# Patient Record
Sex: Female | Born: 1955 | Race: White | Hispanic: Yes | Smoking: Former smoker
Health system: Southern US, Community
[De-identification: ages and names within clinical notes are randomized; demographics above are authoritative.]

## PROBLEM LIST (undated history)

## (undated) DIAGNOSIS — Z9889 Other specified postprocedural states: Secondary | ICD-10-CM

## (undated) DIAGNOSIS — R112 Nausea with vomiting, unspecified: Secondary | ICD-10-CM

## (undated) DIAGNOSIS — I10 Essential (primary) hypertension: Secondary | ICD-10-CM

## (undated) DIAGNOSIS — F32A Depression, unspecified: Secondary | ICD-10-CM

## (undated) DIAGNOSIS — F329 Major depressive disorder, single episode, unspecified: Secondary | ICD-10-CM

## (undated) DIAGNOSIS — F419 Anxiety disorder, unspecified: Secondary | ICD-10-CM

## (undated) DIAGNOSIS — T7840XA Allergy, unspecified, initial encounter: Secondary | ICD-10-CM

## (undated) DIAGNOSIS — Z8489 Family history of other specified conditions: Secondary | ICD-10-CM

## (undated) DIAGNOSIS — R232 Flushing: Secondary | ICD-10-CM

## (undated) DIAGNOSIS — R319 Hematuria, unspecified: Secondary | ICD-10-CM

## (undated) DIAGNOSIS — Z9289 Personal history of other medical treatment: Secondary | ICD-10-CM

## (undated) DIAGNOSIS — E785 Hyperlipidemia, unspecified: Secondary | ICD-10-CM

## (undated) HISTORY — DX: Depression, unspecified: F32.A

## (undated) HISTORY — DX: Flushing: R23.2

## (undated) HISTORY — DX: Personal history of other medical treatment: Z92.89

## (undated) HISTORY — DX: Allergy, unspecified, initial encounter: T78.40XA

## (undated) HISTORY — DX: Anxiety disorder, unspecified: F41.9

## (undated) HISTORY — DX: Hyperlipidemia, unspecified: E78.5

## (undated) HISTORY — DX: Hematuria, unspecified: R31.9

## (undated) HISTORY — PX: ECTOPIC PREGNANCY SURGERY: SHX613

## (undated) HISTORY — DX: Major depressive disorder, single episode, unspecified: F32.9

## (undated) HISTORY — PX: OTHER SURGICAL HISTORY: SHX169

## (undated) HISTORY — PX: BREAST LUMPECTOMY: SHX2

---

## 2010-01-27 ENCOUNTER — Emergency Department (HOSPITAL_COMMUNITY): Admission: EM | Admit: 2010-01-27 | Discharge: 2010-01-28 | Payer: Self-pay | Admitting: Emergency Medicine

## 2010-02-07 ENCOUNTER — Ambulatory Visit: Payer: Self-pay | Admitting: Psychiatry

## 2010-02-07 ENCOUNTER — Inpatient Hospital Stay (HOSPITAL_COMMUNITY)
Admission: AD | Admit: 2010-02-07 | Discharge: 2010-02-11 | Payer: Self-pay | Source: Home / Self Care | Admitting: Psychiatry

## 2010-02-14 ENCOUNTER — Encounter
Admission: RE | Admit: 2010-02-14 | Discharge: 2010-02-14 | Payer: Self-pay | Source: Home / Self Care | Attending: Orthopedic Surgery | Admitting: Orthopedic Surgery

## 2010-05-20 LAB — URINE MICROSCOPIC-ADD ON

## 2010-05-20 LAB — COMPREHENSIVE METABOLIC PANEL
ALT: 16 U/L (ref 0–35)
Albumin: 4 g/dL (ref 3.5–5.2)
Alkaline Phosphatase: 85 U/L (ref 39–117)
Glucose, Bld: 112 mg/dL — ABNORMAL HIGH (ref 70–99)
Potassium: 3.9 mEq/L (ref 3.5–5.1)
Sodium: 142 mEq/L (ref 135–145)
Total Protein: 7.6 g/dL (ref 6.0–8.3)

## 2010-05-20 LAB — DRUGS OF ABUSE SCREEN W/O ALC, ROUTINE URINE
Barbiturate Quant, Ur: NEGATIVE
Benzodiazepines.: NEGATIVE
Cocaine Metabolites: NEGATIVE
Methadone: NEGATIVE
Phencyclidine (PCP): NEGATIVE

## 2010-05-20 LAB — CBC
HCT: 39.3 % (ref 36.0–46.0)
Platelets: 254 10*3/uL (ref 150–400)
RDW: 13.3 % (ref 11.5–15.5)
WBC: 6.9 10*3/uL (ref 4.0–10.5)

## 2010-05-20 LAB — URINALYSIS, ROUTINE W REFLEX MICROSCOPIC
Glucose, UA: NEGATIVE mg/dL
Ketones, ur: NEGATIVE mg/dL
Protein, ur: NEGATIVE mg/dL

## 2010-08-16 ENCOUNTER — Emergency Department (HOSPITAL_COMMUNITY)
Admission: EM | Admit: 2010-08-16 | Discharge: 2010-08-16 | Disposition: A | Discharge status: Home / Self Care | Payer: Medicare Other | Attending: Emergency Medicine | Admitting: Emergency Medicine

## 2010-08-16 ENCOUNTER — Emergency Department (HOSPITAL_COMMUNITY): Payer: Medicare Other

## 2010-08-16 DIAGNOSIS — R220 Localized swelling, mass and lump, head: Secondary | ICD-10-CM | POA: Insufficient documentation

## 2010-08-16 DIAGNOSIS — Z79899 Other long term (current) drug therapy: Secondary | ICD-10-CM | POA: Insufficient documentation

## 2010-08-16 DIAGNOSIS — R079 Chest pain, unspecified: Secondary | ICD-10-CM | POA: Insufficient documentation

## 2010-08-16 DIAGNOSIS — S01501A Unspecified open wound of lip, initial encounter: Secondary | ICD-10-CM | POA: Insufficient documentation

## 2010-08-16 DIAGNOSIS — R070 Pain in throat: Secondary | ICD-10-CM | POA: Insufficient documentation

## 2010-08-16 DIAGNOSIS — R05 Cough: Secondary | ICD-10-CM | POA: Insufficient documentation

## 2010-08-16 DIAGNOSIS — R059 Cough, unspecified: Secondary | ICD-10-CM | POA: Insufficient documentation

## 2010-08-16 DIAGNOSIS — Y9210 Unspecified residential institution as the place of occurrence of the external cause: Secondary | ICD-10-CM | POA: Insufficient documentation

## 2010-08-16 DIAGNOSIS — R221 Localized swelling, mass and lump, neck: Secondary | ICD-10-CM | POA: Insufficient documentation

## 2010-08-16 DIAGNOSIS — S0003XA Contusion of scalp, initial encounter: Secondary | ICD-10-CM | POA: Insufficient documentation

## 2010-08-16 DIAGNOSIS — IMO0002 Reserved for concepts with insufficient information to code with codable children: Secondary | ICD-10-CM | POA: Insufficient documentation

## 2010-08-16 DIAGNOSIS — R509 Fever, unspecified: Secondary | ICD-10-CM | POA: Insufficient documentation

## 2010-08-16 DIAGNOSIS — J40 Bronchitis, not specified as acute or chronic: Secondary | ICD-10-CM | POA: Insufficient documentation

## 2010-08-16 DIAGNOSIS — Z7982 Long term (current) use of aspirin: Secondary | ICD-10-CM | POA: Insufficient documentation

## 2011-11-26 ENCOUNTER — Encounter: Payer: Self-pay | Admitting: Gastroenterology

## 2011-11-26 ENCOUNTER — Other Ambulatory Visit: Payer: Self-pay | Admitting: Family Medicine

## 2011-11-26 DIAGNOSIS — Z1231 Encounter for screening mammogram for malignant neoplasm of breast: Secondary | ICD-10-CM

## 2011-12-17 ENCOUNTER — Ambulatory Visit: Payer: Medicare Other

## 2011-12-17 ENCOUNTER — Ambulatory Visit: Payer: Medicare Other | Admitting: Cardiology

## 2011-12-24 ENCOUNTER — Ambulatory Visit (AMBULATORY_SURGERY_CENTER): Payer: Medicare Other | Admitting: *Deleted

## 2011-12-24 VITALS — Ht 60.0 in | Wt 142.7 lb

## 2011-12-24 DIAGNOSIS — Z1211 Encounter for screening for malignant neoplasm of colon: Secondary | ICD-10-CM

## 2011-12-24 MED ORDER — NA SULFATE-K SULFATE-MG SULF 17.5-3.13-1.6 GM/177ML PO SOLN: 1.0000 | Freq: Once | ORAL | Status: DC

## 2012-01-06 ENCOUNTER — Encounter: Payer: Self-pay | Admitting: *Deleted

## 2012-01-06 ENCOUNTER — Encounter: Payer: Medicare Other | Admitting: Gastroenterology

## 2012-01-06 ENCOUNTER — Encounter: Payer: Self-pay | Admitting: Cardiology

## 2012-01-06 DIAGNOSIS — F418 Other specified anxiety disorders: Secondary | ICD-10-CM | POA: Insufficient documentation

## 2012-01-06 DIAGNOSIS — E785 Hyperlipidemia, unspecified: Secondary | ICD-10-CM | POA: Insufficient documentation

## 2012-01-06 DIAGNOSIS — T7840XA Allergy, unspecified, initial encounter: Secondary | ICD-10-CM | POA: Insufficient documentation

## 2012-01-06 DIAGNOSIS — F419 Anxiety disorder, unspecified: Secondary | ICD-10-CM | POA: Insufficient documentation

## 2012-01-07 ENCOUNTER — Ambulatory Visit (INDEPENDENT_AMBULATORY_CARE_PROVIDER_SITE_OTHER): Payer: Medicare Other | Admitting: Cardiology

## 2012-01-07 ENCOUNTER — Encounter: Payer: Self-pay | Admitting: *Deleted

## 2012-01-07 VITALS — BP 136/84 | HR 74 | Ht 60.0 in | Wt 142.8 lb

## 2012-01-07 DIAGNOSIS — R079 Chest pain, unspecified: Secondary | ICD-10-CM | POA: Insufficient documentation

## 2012-01-07 DIAGNOSIS — R319 Hematuria, unspecified: Secondary | ICD-10-CM | POA: Insufficient documentation

## 2012-01-07 DIAGNOSIS — R232 Flushing: Secondary | ICD-10-CM | POA: Insufficient documentation

## 2012-01-07 DIAGNOSIS — E785 Hyperlipidemia, unspecified: Secondary | ICD-10-CM

## 2012-01-07 NOTE — Progress Notes (Signed)
HPI: 56 year old female with no prior cardiac history for evaluation of chest pain. Patient is a very difficult historian. However she describes intermittent chest pain for approximately 2 months. The pain is in the left chest area. It is described as a sharp pain and increased with stress. It lasted "a while". She cannot be more specific. She occasionally has pain in her left upper extremity and left lower extremity. Her pain can have mild dyspnea but no associated nausea or diaphoresis. It resolves spontaneously. It is not pleuritic. She does not have exertional chest pain and denies dyspnea on exertion, orthopnea or pedal edema. Because of her chest pain we are asked to evaluate.  Current Outpatient Prescriptions  Medication Sig Dispense Refill  . ALPRAZolam (XANAX) 1 MG tablet       . aspirin 81 MG tablet Take 81 mg by mouth as needed.       Marland Kitchen azelastine (ASTELIN) 137 MCG/SPRAY nasal spray 1 spray as needed.       . busPIRone (BUSPAR) 5 MG tablet Take 5 mg by mouth 3 (three) times daily.      . clonazePAM (KLONOPIN) 0.5 MG tablet Take 0.5 mg by mouth 3 (three) times daily as needed.      . cyclobenzaprine (FLEXERIL) 10 MG tablet Take by mouth 2 (two) times daily as needed.       . Na Sulfate-K Sulfate-Mg Sulf (SUPREP BOWEL PREP) SOLN Take 1 kit by mouth once. suprep as directed. No substitutions  354 mL  0  . sertraline (ZOLOFT) 100 MG tablet Take 100 mg by mouth daily.      . simvastatin (ZOCOR) 20 MG tablet Take 20 mg by mouth at bedtime.        Allergies  Allergen Reactions  . Hydrocodone Nausea And Vomiting    dizzy  . Mucinex Dm (Dm-Guaifenesin Er) Swelling    Sob, throat closing    Past Medical History  Diagnosis Date  . Allergy     seasonal  . Anxiety   . Hyperlipidemia   . Hot flashes   . Hematuria     Past Surgical History  Procedure Date  . Ectopic pregnancy surgery   . Breast lumpectomy     right breast, not cancer  . Carpel tunnel     History   Social  History  . Marital Status: Legally Separated    Spouse Name: N/A    Number of Children: 1  . Years of Education: N/A   Occupational History  .     Social History Main Topics  . Smoking status: Former Games developer  . Smokeless tobacco: Never Used  . Alcohol Use: No  . Drug Use: No  . Sexually Active: Not on file   Other Topics Concern  . Not on file   Social History Narrative  . No narrative on file    Family History  Problem Relation Age of Onset  . Heart disease Mother   . Diabetes Mother   . Esophageal cancer Father     13's  . Colon cancer Neg Hx     ROS: occasional pain in left upper extremity and bottom of left foot but no fevers or chills, productive cough, hemoptysis, dysphasia, odynophagia, melena, hematochezia, dysuria, hematuria, rash, seizure activity, orthopnea, PND, pedal edema, claudication. Remaining systems are negative.  Physical Exam:   Blood pressure 136/84, pulse 74, height 5' (1.524 m), weight 142 lb 12.8 oz (64.774 kg).  General:  Well developed/well nourished, anxious in NAD Skin  warm/dry Patient not depressed No peripheral clubbing Back-normal HEENT-normal/normal eyelids Neck supple/normal carotid upstroke bilaterally; no bruits; no JVD; no thyromegaly chest - CTA/ normal expansion CV - RRR/normal S1 and S2; no murmurs, rubs or gallops;  PMI nondisplaced Abdomen -NT/ND, no HSM, no mass, + bowel sounds, no bruit 2+ femoral pulses, no bruits Ext-no edema, chords, 2+ DP Neuro-grossly nonfocal  ECG NSR with no ST changes

## 2012-01-07 NOTE — Assessment & Plan Note (Signed)
Management per primary care. 

## 2012-01-07 NOTE — Assessment & Plan Note (Signed)
Difficult historian. However symptoms are extremely atypical. Electrocardiogram is normal. Will arrange exercise treadmill for risk stratification.

## 2012-01-07 NOTE — Patient Instructions (Addendum)
Your physician recommends that you schedule a follow-up appointment in: AS NEEDED  Your physician has requested that you have an exercise tolerance test. For further information please visit [email or web address]. Please also follow instruction sheet, as given.    Exercise Stress Electrocardiography An exercise stress test is a heart test (EKG) which is done while you are moving. You will walk on a treadmill. This test will tell your doctor how your heart does when it is forced to work harder and how much activity you can safely handle. BEFORE THE TEST  Wear shorts or athletic pants.  Wear comfortable tennis shoes.  Women need to wear a bra that allows patches to be put on under it. TEST  An EKG cable will be attached to your waist. This cable is hooked up to patches, which look like round stickers stuck to your chest.  You will be asked to walk on the treadmill.  You will walk until you are too tired or until you are told to stop.  Tell the doctor right away if you have:  Chest pain.  Leg cramps.  Shortness of breath.  Dizziness.  The test may last 30 minutes to 1 hour. The timing depends on your physical condition and the condition of your heart. AFTER THE TEST  You will rest for about 6 minutes. During this time, your heart rhythm and blood pressure will be checked.  The testing equipment will be removed from your body and you can get dressed.  You may go home or back to your hospital room. You may keep doing all your usual activities as told by your doctor. Finding out the results of your test Ask when your test results will be ready. Make sure you get your test results. Document Released: 08/12/2007 Document Revised: 05/18/2011 Document Reviewed: 08/12/2007 Exit[employer] Patient Information 2013 [employer].   

## 2012-01-12 ENCOUNTER — Ambulatory Visit
Admission: RE | Admit: 2012-01-12 | Discharge: 2012-01-12 | Disposition: A | Discharge status: Home / Self Care | Payer: Medicare Other | Source: Ambulatory Visit | Attending: Family Medicine | Admitting: Family Medicine

## 2012-01-12 DIAGNOSIS — Z1231 Encounter for screening mammogram for malignant neoplasm of breast: Secondary | ICD-10-CM

## 2012-01-25 ENCOUNTER — Encounter: Payer: Medicare Other | Admitting: Physician Assistant

## 2012-01-28 ENCOUNTER — Encounter: Payer: Medicare Other | Admitting: Gastroenterology

## 2012-02-09 ENCOUNTER — Ambulatory Visit (INDEPENDENT_AMBULATORY_CARE_PROVIDER_SITE_OTHER): Payer: Medicare Other | Admitting: Physician Assistant

## 2012-02-09 DIAGNOSIS — R079 Chest pain, unspecified: Secondary | ICD-10-CM

## 2012-02-09 DIAGNOSIS — R9439 Abnormal result of other cardiovascular function study: Secondary | ICD-10-CM

## 2012-02-09 NOTE — Patient Instructions (Addendum)
Your physician has requested that you have a lexiscan myoview DX ABNORMAL STRESS TEST. For further information please visit [email or web address] Please follow instruction sheet, as given.  PLEASE FOLLOW UP WITH PRIMARY CARE PHYSICIAN FOR BLOOD PRESSURE

## 2012-02-09 NOTE — Progress Notes (Signed)
Exercise Treadmill Test  Pre-Exercise Testing Evaluation Rhythm: normal sinus  Rate: 64   PR:  .19 QRS:  .04  QT:  .32 QTc: .33           Test  Exercise Tolerance Test Ordering MD: Olga Millers, MD  Interpreting MD: Tereso Newcomer, PA-C  Unique Test No: 1  Treadmill:  1  Indication for ETT: chest pain - rule out ischemia  Contraindication to ETT: No   Stress Modality: exercise - treadmill  Cardiac Imaging Performed: non   Protocol: standard Bruce - maximal  Max BP:  211/111  Max MPHR (bpm):  164 85% MPR (bpm):  139  MPHR obtained (bpm):  155 % MPHR obtained:  94%  Reached 85% MPHR (min:sec):  3:24 Total Exercise Time (min-sec):  4:02  Workload in METS:  5.8 Borg Scale: 16  Reason ETT Terminated:  exaggerated hypertensive response    ST Segment Analysis At Rest: normal ST segments - no evidence of significant ST depression With Exercise: borderline ST changes  Other Information Arrhythmia:  Occasional PVCs Angina during ETT:  absent (0) Quality of ETT:  indeterminate  ETT Interpretation:  borderline (indeterminate) with non-specific ST changes  Comments: Fair exercise tolerance. No chest pain.  She did complain of dyspnea. Hypertensive BP response to exercise.  Test d/c'd due to exaggerated BP response. Borderline ST changes- cannot rule out ischemia.   Recommendations: Given Hypertensive response and borderline ECG changes, will schedule Lexiscan Myoview to rule out ischemia. Patient should follow up with PCP for BP control. Follow up with Dr. Olga Millers as directed. Signed,  Tereso Newcomer, PA-C  1:11 PM 02/09/2012

## 2012-02-11 ENCOUNTER — Ambulatory Visit (HOSPITAL_COMMUNITY): Payer: Medicare Other | Attending: Cardiology | Admitting: Radiology

## 2012-02-11 VITALS — BP 147/75 | Ht 60.0 in | Wt 141.0 lb

## 2012-02-11 DIAGNOSIS — R0602 Shortness of breath: Secondary | ICD-10-CM

## 2012-02-11 DIAGNOSIS — Z87891 Personal history of nicotine dependence: Secondary | ICD-10-CM | POA: Insufficient documentation

## 2012-02-11 DIAGNOSIS — Z8249 Family history of ischemic heart disease and other diseases of the circulatory system: Secondary | ICD-10-CM | POA: Insufficient documentation

## 2012-02-11 DIAGNOSIS — R9439 Abnormal result of other cardiovascular function study: Secondary | ICD-10-CM

## 2012-02-11 DIAGNOSIS — E785 Hyperlipidemia, unspecified: Secondary | ICD-10-CM | POA: Insufficient documentation

## 2012-02-11 DIAGNOSIS — R079 Chest pain, unspecified: Secondary | ICD-10-CM | POA: Insufficient documentation

## 2012-02-11 MED ORDER — TECHNETIUM TC 99M SESTAMIBI GENERIC - CARDIOLITE
11.0000 | Freq: Once | INTRAVENOUS | Status: AC | PRN
  Administered 2012-02-11: 11 via INTRAVENOUS

## 2012-02-11 MED ORDER — AMINOPHYLLINE 25 MG/ML IV SOLN
150.0000 mg | Freq: Once | INTRAVENOUS | Status: AC
  Administered 2012-02-11: 150 mg via INTRAVENOUS

## 2012-02-11 MED ORDER — REGADENOSON 0.4 MG/5ML IV SOLN
0.4000 mg | Freq: Once | INTRAVENOUS | Status: AC
  Administered 2012-02-11: 0.4 mg via INTRAVENOUS

## 2012-02-11 MED ORDER — TECHNETIUM TC 99M SESTAMIBI GENERIC - CARDIOLITE
33.0000 | Freq: Once | INTRAVENOUS | Status: AC | PRN
  Administered 2012-02-11: 33 via INTRAVENOUS

## 2012-02-11 NOTE — Progress Notes (Signed)
[facility] SITE 3 NUCLEAR MED [id number] [place] [address] [phone]  Cardiology Nuclear Med Study  Katie Gardner is a 56 y.o. female     MRN : [id number]     DOB: [birth year]  Procedure Date: 02/11/2012  Nuclear Med Background Indication for Stress Test:  Evaluation for Ischemia, and 02-09-12 GXT cancelled due to Hypertensive response with borderline nonspecific ST changes History:  3 yrs ago MPS: Ok per pt ([place]) 02/09/12 GXT: CX'd due to HTN response with borderline Non Specific ST changes Cardiac Risk Factors: Family History - CAD, History of Smoking and Lipids  Symptoms:  Chest Pain and SOB   Nuclear Pre-Procedure Caffeine/Decaff Intake:  None > 12 hrs NPO After: 9:00pm   Lungs:  clear O2 Sat: 97% on room air. IV 0.9% NS with Angio Cath:  22g  IV Site: R Antecubital x 1, tolerated well IV Started by:  Irean Hong, RN  Chest Size (in):  34 Cup Size: B  Height: 5' (1.524 m)  Weight:  141 lb (63.957 kg)  BMI:  Body mass index is 27.54 kg/(m^2). Tech Comments:  n/a    Nuclear Med Study 1 or 2 day study: 1 day  Stress Test Type:  Treadmill/Lexiscan  Reading MD: Olga Millers, MD  Order Authorizing Provider:  Tonny Bollman, MD, and Tereso Newcomer, [facility]  Resting Radionuclide: Technetium 49m Sestamibi  Resting Radionuclide Dose: 11.0 mCi   Stress Radionuclide:  Technetium 24m Sestamibi  Stress Radionuclide Dose: 33.0 mCi           Stress Protocol Rest HR: 70 Stress HR: 144  Rest BP: 147/75 Stress BP: 204/80  Exercise Time (min): n/a METS: n/a   Predicted Max HR: 164 bpm % Max HR: 87.8 bpm Rate Pressure Product: 62952   Dose of Adenosine (mg):  n/a Dose of Lexiscan: 0.4 mg  Dose of Atropine (mg): n/a Dose of Dobutamine: n/a mcg/kg/min (at max HR)  Stress Test Technologist: Milana Na, EMT-P  Nuclear Technologist:  Domenic Polite, CNMT     Rest Procedure:  Myocardial perfusion imaging was performed at rest 45  minutes following the intravenous administration of Technetium 35m Sestamibi. Rest ECG: NSR - Normal EKG  Stress Procedure:  The patient received IV Lexiscan 0.4 mg over 15-seconds with concurrent low level exercise and then Technetium 59m Tetrofosmin was injected at 30-seconds while the patient continued walking one more minute. There were no significant changes, + chest heaviness, weakness, and nausea with Lexiscan.This patient was very weak and had a lot of nausea. She was reversed with Aminophylline 150 mg IV with total reversal of the symptoms. Quantitative spect images were obtained after a 45-minute delay. Stress ECG: Insignificant upsloping ST segment depression.  QPS Raw Data Images:  There is interference from nuclear activity from structures below the diaphragm. This does not affect the ability to read the study. Stress Images:  There is decreased uptake in the apex. Rest Images:  There is decreased uptake in the apex. Subtraction (SDS):  No evidence of ischemia. Transient Ischemic Dilatation (Normal <1.22):  0.93 Lung/Heart Ratio (Normal <0.45):  0.12  Quantitative Gated Spect Images QGS EDV:  71 ml QGS ESV:  23 ml  Impression Exercise Capacity:  Lexiscan with no exercise. BP Response:  Hypertensive blood pressure response. Clinical Symptoms:  There is chest pain. ECG Impression:  Insignificant upsloping ST segment depression. Comparison with Prior Nuclear Study: No images to compare  Overall Impression:  Normal stress nuclear study with  a small, mild, fixed apical defect consistent with thinning; no ischemia.  LV Ejection Fraction: 68%.  LV Wall Motion:  NL LV Function; NL Wall Motion  Olga Millers

## 2012-02-12 ENCOUNTER — Encounter: Payer: Self-pay | Admitting: Physician Assistant

## 2013-03-04 ENCOUNTER — Emergency Department (HOSPITAL_COMMUNITY)
Admission: EM | Admit: 2013-03-04 | Discharge: 2013-03-04 | Disposition: A | Discharge status: Home / Self Care | Payer: Medicare Other | Attending: Emergency Medicine | Admitting: Emergency Medicine

## 2013-03-04 ENCOUNTER — Encounter (HOSPITAL_COMMUNITY): Payer: Self-pay | Admitting: Emergency Medicine

## 2013-03-04 DIAGNOSIS — E785 Hyperlipidemia, unspecified: Secondary | ICD-10-CM | POA: Insufficient documentation

## 2013-03-04 DIAGNOSIS — Z7982 Long term (current) use of aspirin: Secondary | ICD-10-CM | POA: Insufficient documentation

## 2013-03-04 DIAGNOSIS — Z8742 Personal history of other diseases of the female genital tract: Secondary | ICD-10-CM | POA: Insufficient documentation

## 2013-03-04 DIAGNOSIS — Z87891 Personal history of nicotine dependence: Secondary | ICD-10-CM | POA: Insufficient documentation

## 2013-03-04 DIAGNOSIS — Z79899 Other long term (current) drug therapy: Secondary | ICD-10-CM | POA: Insufficient documentation

## 2013-03-04 DIAGNOSIS — L259 Unspecified contact dermatitis, unspecified cause: Secondary | ICD-10-CM

## 2013-03-04 DIAGNOSIS — Z87448 Personal history of other diseases of urinary system: Secondary | ICD-10-CM | POA: Insufficient documentation

## 2013-03-04 DIAGNOSIS — F411 Generalized anxiety disorder: Secondary | ICD-10-CM | POA: Insufficient documentation

## 2013-03-04 MED ORDER — PREDNISONE 50 MG PO TABS: ORAL_TABLET | ORAL | Status: DC

## 2013-03-04 MED ORDER — DIPHENHYDRAMINE HCL 25 MG PO CAPS: 25.0000 mg | ORAL_CAPSULE | Freq: Four times a day (QID) | ORAL | Status: DC | PRN

## 2013-03-04 MED ORDER — FAMOTIDINE 20 MG PO TABS: 20.0000 mg | ORAL_TABLET | Freq: Two times a day (BID) | ORAL | Status: DC

## 2013-03-04 NOTE — ED Notes (Signed)
Genera;ized rash over her body since yesterday withnitching.  She thinks she is allergic to mushrooms she ate them yesterday

## 2013-03-04 NOTE — ED Provider Notes (Signed)
CSN: [id number]     Arrival date & time 03/04/13  1944 History   First MD Initiated Contact with Patient 03/04/13 2044 This chart was scribed for non-physician practitioner Allean Found, PA-C working with Hurman Horn, MD by Valera Castle, ED scribe. This patient was seen in room TR07C/TR07C and the patient's care was started at 8:54 PM.     Chief Complaint  Patient presents with  . Rash    The history is provided by the patient. No language interpreter was used.   HPI Comments: Arti Trang is a 57 y.o. female who presents to the Emergency Department complaining of sudden, generalized, itching rash over her body, onset yesterday. She denies h/o similar rash. She reports she has tried Benadryl, with little relief. She denies fever, and any other associated symptoms. She denies h/o DM.   PCP - Katharina Caper, NP  Past Medical History  Diagnosis Date  . Allergy     seasonal  . Anxiety   . Hyperlipidemia   . Hot flashes   . Hematuria   . Hx of cardiovascular stress test     Lex MV 12/13:  EF 68%, mild apical thinning, no ischemia   Past Surgical History  Procedure Laterality Date  . Ectopic pregnancy surgery    . Breast lumpectomy      right breast, not cancer  . Carpel tunnel     Family History  Problem Relation Age of Onset  . Heart disease Mother   . Diabetes Mother   . Esophageal cancer Father     83's  . Colon cancer Neg Hx    History  Substance Use Topics  . Smoking status: Former Games developer  . Smokeless tobacco: Never Used  . Alcohol Use: No   OB History   Grav Para Term Preterm Abortions TAB SAB Ect Mult Living                 Review of Systems  Constitutional: Negative for fever.  Skin: Positive for rash (generalized, itching).  All other systems reviewed and are negative.    Allergies  Hydrocodone and Mucinex dm  Home Medications   Current Outpatient Rx  Name  Route  Sig  Dispense  Refill  . ALPRAZolam (XANAX) 1 MG tablet               .  aspirin 81 MG tablet   Oral   Take 81 mg by mouth as needed.          Marland Kitchen azelastine (ASTELIN) 137 MCG/SPRAY nasal spray      1 spray as needed.          . busPIRone (BUSPAR) 5 MG tablet   Oral   Take 5 mg by mouth 3 (three) times daily.         . clonazePAM (KLONOPIN) 0.5 MG tablet   Oral   Take 0.5 mg by mouth 3 (three) times daily as needed.         . cyclobenzaprine (FLEXERIL) 10 MG tablet   Oral   Take by mouth 2 (two) times daily as needed.          . Na Sulfate-K Sulfate-Mg Sulf (SUPREP BOWEL PREP) SOLN   Oral   Take 1 kit by mouth once. suprep as directed. No substitutions   354 mL   0   . sertraline (ZOLOFT) 100 MG tablet   Oral   Take 100 mg by mouth daily.         Marland Kitchen  simvastatin (ZOCOR) 20 MG tablet   Oral   Take 20 mg by mouth at bedtime.          BP 143/69  Pulse 64  Temp(Src) 98.7 F (37.1 C) (Oral)  Resp 18  Ht 5\' 1"  (1.549 m)  Wt 142 lb (64.411 kg)  BMI 26.84 kg/m2  SpO2 98%  Physical Exam  Nursing note and vitals reviewed. Constitutional: She is oriented to person, place, and time. She appears well-developed and well-nourished. No distress.  HENT:  Head: Normocephalic and atraumatic.  Eyes: EOM are normal.  Neck: Neck supple. No tracheal deviation present.  Cardiovascular: Normal rate.   Pulmonary/Chest: Effort normal. No respiratory distress.  Musculoskeletal: Normal range of motion.  Neurological: She is alert and oriented to person, place, and time.  Skin: Skin is warm and dry.  Generally distributed macular papular rash. No vesicles or blisters. Minimal redness consistent with contact dermatitis.   Psychiatric: She has a normal mood and affect. Her behavior is normal.    ED Course  Procedures (including critical care time)  DIAGNOSTIC STUDIES: Oxygen Saturation is 98% on room air, normal by my interpretation.    COORDINATION OF CARE: 8:57 PM-Discussed treatment plan which includes Prednisone, Benadryl, and Pepcid with  pt at bedside and pt agreed to plan.   Labs Review Labs Reviewed - No data to display Imaging Review No results found.  EKG Interpretation   None      No orders of the defined types were placed in this encounter.    MDM  No diagnosis found. 1. Contact dermatitis  Simple rash without other finding c/w contact dermatitis.    I personally performed the services described in this documentation, which was scribed in my presence. The recorded information has been reviewed and is accurate.     Arnoldo Hooker, PA-C 03/04/13 2115

## 2013-03-06 NOTE — ED Provider Notes (Signed)
Medical screening examination/treatment/procedure(s) were performed by non-physician practitioner and as supervising physician I was immediately available for consultation/collaboration.   Darcelle Herrada M Beyounce Dickens, MD 03/06/13 1413 

## 2013-03-16 ENCOUNTER — Emergency Department (HOSPITAL_COMMUNITY)
Admission: EM | Admit: 2013-03-16 | Discharge: 2013-03-16 | Disposition: A | Discharge status: Home / Self Care | Payer: Medicare HMO | Attending: Emergency Medicine | Admitting: Emergency Medicine

## 2013-03-16 ENCOUNTER — Encounter (HOSPITAL_COMMUNITY): Payer: Self-pay | Admitting: Emergency Medicine

## 2013-03-16 DIAGNOSIS — Z862 Personal history of diseases of the blood and blood-forming organs and certain disorders involving the immune mechanism: Secondary | ICD-10-CM | POA: Insufficient documentation

## 2013-03-16 DIAGNOSIS — Z8742 Personal history of other diseases of the female genital tract: Secondary | ICD-10-CM | POA: Insufficient documentation

## 2013-03-16 DIAGNOSIS — M545 Low back pain, unspecified: Secondary | ICD-10-CM | POA: Insufficient documentation

## 2013-03-16 DIAGNOSIS — IMO0002 Reserved for concepts with insufficient information to code with codable children: Secondary | ICD-10-CM | POA: Insufficient documentation

## 2013-03-16 DIAGNOSIS — Z87448 Personal history of other diseases of urinary system: Secondary | ICD-10-CM | POA: Insufficient documentation

## 2013-03-16 DIAGNOSIS — F411 Generalized anxiety disorder: Secondary | ICD-10-CM | POA: Insufficient documentation

## 2013-03-16 DIAGNOSIS — Z79899 Other long term (current) drug therapy: Secondary | ICD-10-CM | POA: Insufficient documentation

## 2013-03-16 DIAGNOSIS — G8929 Other chronic pain: Secondary | ICD-10-CM

## 2013-03-16 DIAGNOSIS — Z8639 Personal history of other endocrine, nutritional and metabolic disease: Secondary | ICD-10-CM | POA: Insufficient documentation

## 2013-03-16 DIAGNOSIS — Z87891 Personal history of nicotine dependence: Secondary | ICD-10-CM | POA: Insufficient documentation

## 2013-03-16 DIAGNOSIS — M2550 Pain in unspecified joint: Secondary | ICD-10-CM | POA: Insufficient documentation

## 2013-03-16 MED ORDER — METHOCARBAMOL 500 MG PO TABS: 500.0000 mg | ORAL_TABLET | Freq: Two times a day (BID) | ORAL | Status: DC

## 2013-03-16 MED ORDER — TRAMADOL HCL 50 MG PO TABS: 50.0000 mg | ORAL_TABLET | Freq: Four times a day (QID) | ORAL | Status: DC | PRN

## 2013-03-16 NOTE — ED Provider Notes (Signed)
CSN: [id number]     Arrival date & time 03/16/13  1109 History  This chart was scribed for non-physician practitioner working with Katie Creasehristopher J. Pollina, MD by Ashley JacobsBrittany Andrews, ED scribe. This patient was seen in room TR05C/TR05C and the patient's care was started at 12:49 PM.   First MD Initiated Contact with Patient 03/16/13 1158     Chief Complaint  Patient presents with  . Back Pain   (Consider location/radiation/quality/duration/timing/severity/associated sxs/prior Treatment) The history is provided by the patient and medical records. No language interpreter was used.   HPI Comments: Katie CablesJuanita Gardner is a 58 y.o. female who presents to the Emergency Department complaining of chronic back pain that has been worse for the past three days. This morning she reports the pain was severe in nature with a 10/10 in severity. Pt denies injury or heavy lifting. The pain radiates to her bilateral buttocks and the back of her legs. The pain is worse with bending, twisting and movement.  No fever or chills. She denies numbness and tingling.  Denies bowel or bladder incontinence. Pt has taken Ibuprofen for the pain, which does help. Past Medical History  Diagnosis Date  . Allergy     seasonal  . Anxiety   . Hyperlipidemia   . Hot flashes   . Hematuria   . Hx of cardiovascular stress test     Lex MV 12/13:  EF 68%, mild apical thinning, no ischemia   Past Surgical History  Procedure Laterality Date  . Ectopic pregnancy surgery    . Breast lumpectomy      right breast, not cancer  . Carpel tunnel     Family History  Problem Relation Age of Onset  . Heart disease Mother   . Diabetes Mother   . Esophageal cancer Father     7670's  . Colon cancer Neg Hx    History  Substance Use Topics  . Smoking status: Former Games developermoker  . Smokeless tobacco: Never Used  . Alcohol Use: No   OB History   Grav Para Term Preterm Abortions TAB SAB Ect Mult Living                 Review of Systems   Constitutional: Negative for fever.  Musculoskeletal: Positive for arthralgias, back pain and myalgias.  Neurological: Negative for seizures.  All other systems reviewed and are negative.    Allergies  Hydrocodone and Mucinex dm  Home Medications   Current Outpatient Rx  Name  Route  Sig  Dispense  Refill  . ibuprofen (ADVIL,MOTRIN) 200 MG tablet   Oral   Take 100 mg by mouth daily as needed for mild pain.         Marland Kitchen. sertraline (ZOLOFT) 100 MG tablet   Oral   Take 100 mg by mouth daily.         . diazepam (VALIUM) 5 MG tablet   Oral   Take 5 mg by mouth daily as needed for anxiety.          . diphenhydrAMINE (BENADRYL) 25 mg capsule   Oral   Take 1 capsule (25 mg total) by mouth every 6 (six) hours as needed.   12 capsule   0   . predniSONE (DELTASONE) 50 MG tablet      Take 3 tablets days 1 and 2 Take 2 tablets days 3 and 4 Take 1 tablet days 5 and 6   12 tablet   0    BP 136/52  Pulse  82  Temp(Src) 98.5 F (36.9 C) (Oral)  Resp 20  SpO2 94% Physical Exam  Nursing note and vitals reviewed. Constitutional: She appears well-developed and well-nourished. No distress.  Awake, alert, nontoxic appearance  HENT:  Head: Normocephalic and atraumatic.  Mouth/Throat: Oropharynx is clear and moist. No oropharyngeal exudate.  Eyes: Conjunctivae are normal. No scleral icterus.  Neck: Normal range of motion. Neck supple.  Cardiovascular: Normal rate, regular rhythm and intact distal pulses.   Pulmonary/Chest: Effort normal and breath sounds normal.  Musculoskeletal: Normal range of motion. She exhibits tenderness. She exhibits no edema.  Mild lumbar spinal tenderness No edema, no erythema  Neurological: She is alert. She has normal strength. No sensory deficit. Gait normal.  Reflex Scores:      Patellar reflexes are 2+ on the right side and 2+ on the left side. Speech is clear and goal oriented  Skin: Skin is warm and dry. She is not diaphoretic.   Psychiatric: She has a normal mood and affect.    ED Course  Procedures (including critical care time) DIAGNOSTIC STUDIES: Oxygen Saturation is 94% on room air, normal by my interpretation.    COORDINATION OF CARE:  12:52 PM Discussed course of care with pt . Pt understands and agrees.  Labs Review Labs Reviewed - No data to display Imaging Review No results found.  EKG Interpretation   None       MDM  No diagnosis found. Patient with back pain.  No neurological deficits and normal neuro exam.  Patient can walk but states is painful.  No loss of bowel or bladder control.  No concern for cauda equina.  No fever, night sweats, weight loss, h/o cancer, IVDU.  RICE protocol and pain medicine indicated and discussed with patient.  Patient stable for discharge.  Return precautions given.  I personally performed the services described in this documentation, which was scribed in my presence. The recorded information has been reviewed and is accurate.     [name], PA-C 03/16/13 1339

## 2013-03-16 NOTE — Discharge Instructions (Signed)
Continue taking Ibuprofen for the pain.  If the pain is more severe, take Ultram and Robaxin for the pain.    Use conservative methods at home including heat therapy and cold therapy as we discussed. More information on cold therapy is listed below.  It is not reccommended to use heat treatment directly after an acute injury.  SEEK IMMEDIATE MEDICAL ATTENTION IF: New numbness, tingling, weakness, or problem with the use of your arms or legs.  Severe back pain not relieved with medications.  Change in bowel or bladder control.  Increasing pain in any areas of the body (such as chest or abdominal pain).  Shortness of breath, dizziness or fainting.  Nausea (feeling sick to your stomach), vomiting, fever, or sweats.  COLD THERAPY DIRECTIONS:  Ice or gel packs can be used to reduce both pain and swelling. Ice is the most helpful within the first 24 to 48 hours after an injury or flareup from overusing a muscle or joint.  Ice is effective, has very few side effects, and is safe for most people to use.   If you expose your skin to cold temperatures for too long or without the proper protection, you can damage your skin or nerves. Watch for signs of skin damage due to cold.   HOME CARE INSTRUCTIONS  Follow these tips to use ice and cold packs safely.  Place a dry or damp towel between the ice and skin. A damp towel will cool the skin more quickly, so you may need to shorten the time that the ice is used.  For a more rapid response, add gentle compression to the ice.  Ice for no more than 10 to 20 minutes at a time. The bonier the area you are icing, the less time it will take to get the benefits of ice.  Check your skin after 5 minutes to make sure there are no signs of a poor response to cold or skin damage.  Rest 20 minutes or more in between uses.  Once your skin is numb, you can end your treatment. You can test numbness by very lightly touching your skin. The touch should be so light that you do  not see the skin dimple from the pressure of your fingertip. When using ice, most people will feel these normal sensations in this order: cold, burning, aching, and numbness.  Do not use ice on someone who cannot communicate their responses to pain, such as small children or people with dementia.   HOW TO MAKE AN ICE PACK  To make an ice pack, do one of the following:  Place crushed ice or a bag of frozen vegetables in a sealable plastic bag. Squeeze out the excess air. Place this bag inside another plastic bag. Slide the bag into a pillowcase or place a damp towel between your skin and the bag.  Mix 3 parts water with 1 part rubbing alcohol. Freeze the mixture in a sealable plastic bag. When you remove the mixture from the freezer, it will be slushy. Squeeze out the excess air. Place this bag inside another plastic bag. Slide the bag into a pillowcase or place a damp towel between your skin and the bag.   SEEK MEDICAL CARE IF:  You develop white spots on your skin. This may give the skin a blotchy (mottled) appearance.  Your skin turns blue or pale.  Your skin becomes waxy or hard.  Your swelling gets worse.  MAKE SURE YOU:  Understand these instructions.  Will watch your condition.  Will get help right away if you are not doing well or get worse.      Emergency Department Resource Guide 1) Find a Doctor and Pay Out of Pocket Although you won't have to find out who is covered by your insurance plan, it is a good idea to ask around and get recommendations. You will then need to call the office and see if the doctor you have chosen will accept you as a new patient and what types of options they offer for patients who are self-pay. Some doctors offer discounts or will set up payment plans for their patients who do not have insurance, but you will need to ask so you aren't surprised when you get to your appointment.  2) Contact Your Local Health Department Not all health departments have  doctors that can see patients for sick visits, but many do, so it is worth a call to see if yours does. If you don't know where your local health department is, you can check in your phone book. The CDC also has a tool to help you locate your state's health department, and many state websites also have listings of all of their local health departments.  3) Find a Walk-in Clinic If your illness is not likely to be very severe or complicated, you may want to try a walk in clinic. These are popping up all over the country in pharmacies, drugstores, and shopping centers. They're usually staffed by nurse practitioners or physician assistants that have been trained to treat common illnesses and complaints. They're usually fairly quick and inexpensive. However, if you have serious medical issues or chronic medical problems, these are probably not your best option.  No Primary Care Doctor: - Call Health Connect at  [phone] - they can help you locate a primary care doctor that  accepts your insurance, provides certain services, etc. - Physician Referral Service- [phone]  Chronic Pain Problems: Organization         Address  Phone   Notes  [facility]  [phone] Patients need to be referred by their primary care doctor.   Medication Assistance: Organization         Address  Phone   Notes  [facility] Medication [facility] [address] [place] [place], [place] [address] [phone] --Must be a resident of [facility] -- Must have NO insurance coverage whatsoever (no Medicaid/ Medicare, etc.) -- The pt. MUST have a primary care doctor that directs their care regularly and follows them in the community   [facility]  [phone]   [employer]  [phone]    Agencies that provide inexpensive medical care: Organization         Address  Phone   Notes  [facility]  [phone]   [facility]    [phone]    [facility] [address] [place], [place] [address] [phone]   [facility] [address] [place] [place] [phone]   Planned Parenthood    [phone]   [facility]    [phone]   [facility]  [address], [place] Phone:  [phone], Fax:  [phone] Hours of Operation:  9 am - 6 pm, M-F.  Also accepts Medicaid/Medicare and self-pay.  [facility]  [address], [place] Phone: [phone])  [phone], Fax: [phone]. Hours of Operation:  8:30 am - 5:30 pm, M-F.  Also accepts Medicaid and self-pay.  [facility] [place] [address] [place] Phone: [phone]   [facility] [address] Natasha Bence [place], [place] [phone], Ext. [phone] Mondays & Thursdays: 7-9 AM.  First 15 patients are seen on a first come, first serve basis.    Medicaid-accepting [facility] Providers:  Organization         Address  Phone   Notes  [facility] [address], [place] [phone] Also accepts self-pay patients.  [facility] [address] Laurell Josephs [place], [place]  [phone]   [facility] [address], [place] [phone]   [facility] [address], [place] [phone]   Renaye Rakers [address], [place]   [phone] Only accepts [place] Access [place] patients after they have their name applied to their card.   Self-Pay (no insurance) in [facility]:  Organization         Address  Phone   Notes  Sickle Cell Patients, [facility] [address] [place], [place] [phone]   [facility] [address] [place], [place] [phone]   [facility]  [address], [place] [phone]   [facility]/Dr. Osei-Bonsu  [address] [place] or [address], [place] [phone] Phone number for both [place] and [place] locations is the same.  [facility] [address] [place] [phone]   [facility] [address] [place] or [address] [phone]   [facility] [address], [place] [phone], phone; [phone], fax Sees patients 1st and 3rd Saturday of every month.  Must not qualify for public or private insurance (i.e. Medicaid, Medicare, [place] Health Choice, Veterans' Benefits)  Household income should be no more than 200% of the poverty level The clinic cannot treat you if you are pregnant or think you are pregnant  Sexually transmitted diseases are not treated at the clinic.    Dental Care: Organization         Address  Phone  Notes  [facility] Department of [facility] [address] [place], [place] [phone] Accepts children up to age 5 who are enrolled in [place] or [place] Health Choice; pregnant women with a Medicaid card; and children who have applied for Medicaid or [place] Health Choice, but were declined, whose parents can pay a reduced fee at time of service.  [facility]  [address], [place] [phone] Accepts children up to age 38 who are enrolled in [place] or [place] Health Choice; pregnant women with a Medicaid card; and children who have applied for Medicaid or [place] Health Choice, but were declined, whose parents can pay a reduced fee at time of service.  [facility]  [address] [place], [place] [phone] Patients are seen by appointment only. Walk-ins are not accepted. [facility] will see patients 71 years of age and older. Monday - Tuesday (8am-5pm) Most Wednesdays (8:30-5pm) $30 per visit, cash only  [facility]  [address], [employer]  [place] [phone] Patients are seen by appointment only. Walk-ins are not accepted. [facility] will see patients 32 years of age and older. One Wednesday Evening (Monthly: Volunteer Based).  $30 per visit, cash only  [employer]  [phone] for adults; Children under age 55, call Graduate Pediatric Dentistry at [phone]. Children aged 31-14, please call [phone] to request a pediatric application.  Dental services are provided in all areas of dental care including fillings, crowns and bridges, complete and partial dentures, implants, gum treatment, root canals, and extractions. Preventive care is also provided. Treatment is provided to both adults and children. Patients are selected via a lottery and there is often a waiting list.   [facility] [address], [place]  [phone] [email or web address]   [facility] [address] [place], [place] [phone], Ext. [phone] Second and Fourth Thursday of each month, opens at 6:30 AM; Clinic ends at 9 AM.  Patients are seen on a first-come first-served basis, and a limited number are seen during each clinic.   [facility]  [address] Ether Griffins [place], [place] [phone]   Eligibility Requirements You must have lived in [place], [place], or [place] counties for at least the last three months.   You cannot be eligible for state or federal sponsored [place], including [employer], [place], or [employer].   You generally cannot be eligible for healthcare insurance through your employer.    How to apply: Eligibility screenings are held every Tuesday and Wednesday afternoon from 1:00 pm until 4:00 pm. You do not need an appointment for the interview!  [facility] [address] [place], [place] [phone]   [facility]  [phone]   [facility]  [phone]   [facility]  [phone]    Behavioral Health Resources in the Community: Intensive Outpatient Programs Organization         Address  Phone  Notes  [facility] [address], [place], [place] [phone]   [facility] Outpatient [address] [place], [place] [phone]   [facility] [address], [place], [place]  [phone]   [facility] [address]  [place], [place] [phone] or [phone]   Substance Abuse Resources Organization         Address  Phone  Notes  [facility]  [phone]   [facility]  [phone]   [facility]  [phone]   Floydene Flock  [phone]   Residential & Outpatient Substance Abuse Program  [phone]   Psychological Services Organization         Address  Phone  Notes  [facility]  [phone]   [facility]  [phone]   [facility] [address], [place] [phone] or [phone]    Mobile Crisis Teams Organization         Address  Phone  Notes  [facility], Mobile Crisis Care Unit  [phone]   [facility]  [address] [place], [place] [phone]   Doristine Locks [address] [place] [phone]    Self-Help/Support Groups Organization         Address  Phone             Notes  [facility] [place] - variety of support groups  [phone]- [phone] Call for more information  Narcotics Anonymous (NA), [facility] [address], [employer] [place]  2 meetings at this location   Residential Sports administrator         Address  Phone  Notes  [facility] [address],    [place]  [phone]   [facility]  [address], [place] [address], [place], [place] [phone]   [facility]  Facility [address] [place], [place] [phone] Admissions: 8am-3pm M-F  [facility] [address],    [place], [place] [phone]   [facility] [address] [place], [place], [place] [phone]   [facility] [address],  [place], [place] [phone]   [facility] - Intensive Outpatient [address]ure[address] Joseph[address], [place], [place] [phone]   [facility] ([facility]iction [facility] [address] [place]  [place], [place] [phone] or [phone]   [facility] ([facility]) [address], [place], [place] [phone] Accepts Medicaid  [facility] [address]  [place] [phone] Substance Abuse/Addiction Treatment   [facility] Organization         Address  Phone  Notes  [facility]  [phone]   Angie Fava, PhD [address] Ervin Knack [place], [place]   [phone] or [phone]   [facility]al   [address] [place], [place] [phone]   [facility] [address] [place], [place] [phone] Insurance/Medicaid/sponsorship through [facility] [address]                                    [place], [place] [phone] Therapy/tele-psych/case  [facility] [address], [place] [phone]    Dr. Lolly Mustache  [phone]   [facility]  [employer] [facility] Dept. 1) [address], [place] 2) [address] [place] 3)  [address], [place] [phone]  [phone]   [facility] Child Abuse Hotline 479-315-1227 or [phone] (After Hours)

## 2013-03-16 NOTE — ED Notes (Signed)
States that she has chronic back pain and  3 days ago she started to have more back pain denies injury

## 2013-03-16 NOTE — ED Notes (Signed)
Pt called in main ED waiting area with no response 

## 2013-03-21 NOTE — ED Provider Notes (Signed)
Medical screening examination/treatment/procedure(s) were performed by non-physician practitioner and as supervising physician I was immediately available for consultation/collaboration.  Morelia Cassells J. Graclynn Vanantwerp, MD 03/21/13 1101 

## 2013-05-19 ENCOUNTER — Ambulatory Visit
Admission: RE | Admit: 2013-05-19 | Discharge: 2013-05-19 | Disposition: A | Discharge status: Home / Self Care | Payer: Commercial Managed Care - HMO | Source: Ambulatory Visit | Attending: Internal Medicine | Admitting: Internal Medicine

## 2013-05-19 ENCOUNTER — Other Ambulatory Visit: Payer: Self-pay | Admitting: Internal Medicine

## 2013-05-19 DIAGNOSIS — M545 Low back pain, unspecified: Secondary | ICD-10-CM

## 2013-11-27 ENCOUNTER — Other Ambulatory Visit: Payer: Self-pay

## 2016-04-14 ENCOUNTER — Ambulatory Visit (INDEPENDENT_AMBULATORY_CARE_PROVIDER_SITE_OTHER): Payer: Medicare Other | Admitting: Emergency Medicine

## 2016-04-14 VITALS — BP 120/72 | HR 67 | Temp 98.6°F | Resp 18 | Ht 61.0 in | Wt 151.8 lb

## 2016-04-14 DIAGNOSIS — F419 Anxiety disorder, unspecified: Secondary | ICD-10-CM

## 2016-04-14 DIAGNOSIS — E785 Hyperlipidemia, unspecified: Secondary | ICD-10-CM

## 2016-04-14 DIAGNOSIS — F32A Depression, unspecified: Secondary | ICD-10-CM

## 2016-04-14 DIAGNOSIS — F329 Major depressive disorder, single episode, unspecified: Secondary | ICD-10-CM

## 2016-04-14 MED ORDER — SERTRALINE HCL 100 MG PO TABS: 100.0000 mg | ORAL_TABLET | Freq: Every day | ORAL | 6 refills | Status: DC

## 2016-04-14 MED ORDER — ESTROGENS, CONJUGATED 0.625 MG/GM VA CREA: 1.0000 | TOPICAL_CREAM | Freq: Every day | VAGINAL | 12 refills | Status: DC

## 2016-04-14 MED ORDER — LISINOPRIL 2.5 MG PO TABS: 2.5000 mg | ORAL_TABLET | Freq: Every day | ORAL | 6 refills | Status: DC

## 2016-04-14 MED ORDER — SIMVASTATIN 40 MG PO TABS: 40.0000 mg | ORAL_TABLET | Freq: Every day | ORAL | 6 refills | Status: DC

## 2016-04-14 MED ORDER — DIAZEPAM 5 MG PO TABS: 5.0000 mg | ORAL_TABLET | Freq: Every day | ORAL | 1 refills | Status: DC | PRN

## 2016-04-14 NOTE — Progress Notes (Addendum)
Katie Gardner 61 y.o.   Chief Complaint  Patient presents with  . Medication Refill    All meds    HISTORY OF PRESENT ILLNESS: This is a 61 y.o. female here for medication refill. Has no complaints.Marland Kitchen  HPI   [name]   Medication Sig Start Date End Date Taking? Authorizing Provider  cyclobenzaprine (FLEXERIL) 10 MG tablet Take 10 mg by mouth 3 (three) times daily as needed for muscle spasms.   Yes Historical Provider, MD  diazepam (VALIUM) 5 MG tablet Take 1 tablet (5 mg total) by mouth daily as needed for anxiety. 04/14/16  Yes Georgina Quint, MD  HYDROcodone-acetaminophen (NORCO/VICODIN) 5-325 MG tablet Take 1 tablet by mouth every 6 (six) hours as needed for moderate pain.   Yes Historical Provider, MD  lisinopril (PRINIVIL,ZESTRIL) 2.5 MG tablet Take 1 tablet (2.5 mg total) by mouth daily. 04/14/16 05/14/16 Yes Deyvi Bonanno Victorino December, MD  sertraline (ZOLOFT) 100 MG tablet Take 1 tablet (100 mg total) by mouth daily. 04/14/16 05/14/16 Yes Essa Wenk Victorino December, MD  simvastatin (ZOCOR) 40 MG tablet Take 1 tablet (40 mg total) by mouth daily. 04/14/16 05/14/16 Yes Keiara Sneeringer Victorino December, MD  methocarbamol (ROBAXIN) 500 MG tablet Take 1 tablet (500 mg total) by mouth 2 (two) times daily. Patient not taking: Reported on 04/14/2016 03/16/13   [name], PA-C  traMADol (ULTRAM) 50 MG tablet Take 1 tablet (50 mg total) by mouth every 6 (six) hours as needed. Patient not taking: Reported on 04/14/2016 03/16/13   [name], PA-C    Allergies  Allergen Reactions  . Hydrocodone Nausea And Vomiting    dizzy  . Mucinex Dm [Dm-Guaifenesin Er] Swelling    Sob, throat closing    Patient Active Problem List   Diagnosis Date Noted  . Chest pain   . Hot flashes   . Hematuria   . Allergy   . Anxiety   . Hyperlipidemia     Past Medical History:  Diagnosis Date  . Allergy    seasonal  . Anxiety   . Depression   . Hematuria   . Hot flashes   . Hx of cardiovascular  stress test    Lex MV 12/13:  EF 68%, mild apical thinning, no ischemia  . Hyperlipidemia     Past Surgical History:  Procedure Laterality Date  . BREAST LUMPECTOMY     right breast, not cancer  . Carpel tunnel    . ECTOPIC PREGNANCY SURGERY      Social History   Social History  . Marital status: Legally Separated    Spouse name: N/A  . Number of children: 1  . Years of education: N/A   Occupational History  .  Unemployed   Social History Main Topics  . Smoking status: Former Games developer  . Smokeless tobacco: Never Used  . Alcohol use No  . Drug use: No  . Sexual activity: Not on file   Other Topics Concern  . Not on file   Social History Narrative  . No narrative on file    Family History  Problem Relation Age of Onset  . Heart disease Mother   . Diabetes Mother   . Esophageal cancer Father     63's  . Colon cancer Neg Hx      Review of Systems  Constitutional: Negative.  Negative for chills, fever and malaise/fatigue.  HENT: Negative.   Eyes: Negative.   Respiratory: Negative.   Cardiovascular: Negative.   Gastrointestinal: Negative.  Genitourinary: Negative.        +dyspareunia  Musculoskeletal: Negative.   Skin: Negative.   Neurological: Negative.  Negative for weakness.  Endo/Heme/Allergies: Negative.   All other systems reviewed and are negative.  Vitals:   04/14/16 1348  BP: 120/72  Pulse: 67  Resp: 18  Temp: 98.6 F (37 C)     Physical Exam  Constitutional: She is oriented to person, place, and time. She appears well-developed and well-nourished.  HENT:  Head: Normocephalic and atraumatic.  Nose: Nose normal.  Mouth/Throat: Oropharynx is clear and moist.  Eyes: Conjunctivae and EOM are normal. Pupils are equal, round, and reactive to light.  Neck: Normal range of motion. Neck supple. No JVD present. No thyromegaly present.  Cardiovascular: Normal rate, regular rhythm and normal heart sounds.   Pulmonary/Chest: Effort normal and  breath sounds normal.  Abdominal: Soft. Bowel sounds are normal. She exhibits no distension. There is no tenderness.  Musculoskeletal: Normal range of motion.  Lymphadenopathy:    She has no cervical adenopathy.  Neurological: She is alert and oriented to person, place, and time. No sensory deficit. She exhibits normal muscle tone.  Skin: Skin is warm and dry. Capillary refill takes less than 2 seconds.  Psychiatric: She has a normal mood and affect. Her behavior is normal.  Vitals reviewed.    ASSESSMENT & PLAN: Katie Gardner was seen today for medication refill.  Diagnoses and all orders for this visit:  Depression, unspecified depression type  Hyperlipidemia, unspecified hyperlipidemia type  Anxiety  Other orders -     lisinopril (PRINIVIL,ZESTRIL) 2.5 MG tablet; Take 1 tablet (2.5 mg total) by mouth daily. -     simvastatin (ZOCOR) 40 MG tablet; Take 1 tablet (40 mg total) by mouth daily. -     sertraline (ZOLOFT) 100 MG tablet; Take 1 tablet (100 mg total) by mouth daily. -     diazepam (VALIUM) 5 MG tablet; Take 1 tablet (5 mg total) by mouth daily as needed for anxiety. -     sertraline (ZOLOFT) 100 MG tablet; Take 1 tablet (100 mg total) by mouth daily.      Edwina BarthMiguel Teyona Nichelson, MD [facility]

## 2016-04-14 NOTE — Patient Instructions (Signed)
     IF you received an x-ray today, you will receive an invoice from [facility]. Please contact  Radiology at [phone] with questions or concerns regarding your invoice.   IF you received labwork today, you will receive an invoice from [facility]. Please contact [facility] at [phone] with questions or concerns regarding your invoice.   Our billing staff will not be able to assist you with questions regarding bills from these companies.  You will be contacted with the lab results as soon as they are available. The fastest way to get your results is to activate your My Chart account. Instructions are located on the last page of this paperwork. If you have not heard from us regarding the results in 2 weeks, please contact this office.     

## 2016-04-14 NOTE — Addendum Note (Signed)
Addended by: Evie LacksSAGARDIA, Charvez Voorhies J on: 04/14/2016 02:49 PM   Modules accepted: Orders

## 2016-07-15 ENCOUNTER — Encounter: Payer: Self-pay | Admitting: Emergency Medicine

## 2016-07-15 ENCOUNTER — Ambulatory Visit (INDEPENDENT_AMBULATORY_CARE_PROVIDER_SITE_OTHER): Payer: Medicare Other | Admitting: Emergency Medicine

## 2016-07-15 VITALS — BP 161/87 | HR 72 | Temp 98.4°F | Resp 18 | Ht 61.0 in | Wt 152.0 lb

## 2016-07-15 DIAGNOSIS — F419 Anxiety disorder, unspecified: Secondary | ICD-10-CM

## 2016-07-15 DIAGNOSIS — J040 Acute laryngitis: Secondary | ICD-10-CM | POA: Diagnosis not present

## 2016-07-15 MED ORDER — ALPRAZOLAM 0.5 MG PO TABS
0.5000 mg | ORAL_TABLET | Freq: Two times a day (BID) | ORAL | 0 refills | Status: DC | PRN
Start: 2016-07-15 — End: 2017-01-03

## 2016-07-15 NOTE — Progress Notes (Signed)
Katie Gardner 61 y.o.   Chief Complaint  Patient presents with  . medication    going to have dental work and wants medication for nervousness   . Laryngitis    HISTORY OF PRESENT ILLNESS: This is a 61 y.o. female complaining of hoarse voice x several days; no phlegm; also requesting Xanax to take before dental procedure coming up.  HPI   [name]   Medication Sig Start Date End Date Taking? Authorizing Provider  conjugated estrogens (PREMARIN) vaginal cream Place 1 Applicatorful vaginally daily. 04/14/16  Yes Katie Enzor, Eilleen KempfMiguel Jose, Katie Gardner  cyclobenzaprine (FLEXERIL) 10 MG tablet Take 10 mg by mouth 3 (three) times daily as needed for muscle spasms.   Yes Provider, Historical, Katie Gardner  diazepam (VALIUM) 5 MG tablet Take 1 tablet (5 mg total) by mouth daily as needed for anxiety. 04/14/16  Yes Katie Shelnutt, Eilleen KempfMiguel Jose, Katie Gardner  HYDROcodone-acetaminophen (NORCO/VICODIN) 5-325 MG tablet Take 1 tablet by mouth every 6 (six) hours as needed for moderate pain.    Provider, Historical, Katie Gardner  lisinopril (PRINIVIL,ZESTRIL) 2.5 MG tablet Take 1 tablet (2.5 mg total) by mouth daily. 04/14/16 05/14/16  Katie QuintSagardia, Aswad Wandrey Jose, Katie Gardner  methocarbamol (ROBAXIN) 500 MG tablet Take 1 tablet (500 mg total) by mouth 2 (two) times daily. Patient not taking: Reported on 04/14/2016 03/16/13   Katie Gardner  sertraline (ZOLOFT) 100 MG tablet Take 1 tablet (100 mg total) by mouth daily. 04/14/16 05/14/16  Katie QuintSagardia, Latrece Nitta Jose, Katie Gardner  simvastatin (ZOCOR) 40 MG tablet Take 1 tablet (40 mg total) by mouth daily. 04/14/16 05/14/16  Katie QuintSagardia, Nioka Thorington Jose, Katie Gardner  traMADol (ULTRAM) 50 MG tablet Take 1 tablet (50 mg total) by mouth every 6 (six) hours as needed. Patient not taking: Reported on 07/15/2016 03/16/13   Katie Gardner    Allergies  Allergen Reactions  . Hydrocodone Nausea And Vomiting    dizzy  . Mucinex Dm [Dm-Guaifenesin Er] Swelling    Sob, throat closing    Patient Active Problem List   Diagnosis Date Noted   . Chest pain   . Hot flashes   . Hematuria   . Allergy   . Anxiety   . Hyperlipidemia     Past Medical History:  Diagnosis Date  . Allergy    seasonal  . Anxiety   . Depression   . Hematuria   . Hot flashes   . Hx of cardiovascular stress test    Lex MV 12/13:  EF 68%, mild apical thinning, no ischemia  . Hyperlipidemia     Past Surgical History:  Procedure Laterality Date  . BREAST LUMPECTOMY     right breast, not cancer  . Carpel tunnel    . ECTOPIC PREGNANCY SURGERY      Social History   Social History  . Marital status: Legally Separated    Spouse name: N/A  . Number of children: 1  . Years of education: N/A   Occupational History  .  Unemployed   Social History Main Topics  . Smoking status: Former Games developermoker  . Smokeless tobacco: Never Used  . Alcohol use No  . Drug use: No  . Sexual activity: Not on file   Other Topics Concern  . Not on file   Social History Narrative  . No narrative on file    Family History  Problem Relation Age of Onset  . Heart disease Mother   . Diabetes Mother   . Esophageal cancer Father     3370's  .  Colon cancer Neg Hx      Review of Systems  Constitutional: Negative.  Negative for chills and fever.  HENT: Negative for congestion, nosebleeds, sinus pain and sore throat.   Eyes: Negative for discharge and redness.  Respiratory: Negative for cough, shortness of breath and stridor.   Cardiovascular: Negative for chest pain and palpitations.  Gastrointestinal: Negative for abdominal pain, diarrhea, nausea and vomiting.  Skin: Negative.  Negative for rash.  Neurological: Negative for dizziness and headaches.  Endo/Heme/Allergies: Negative.   Psychiatric/Behavioral: The patient is nervous/anxious.   All other systems reviewed and are negative.  Vitals:   07/15/16 1111  BP: (!) 161/87  Pulse: 72  Resp: 18  Temp: 98.4 F (36.9 C)     Physical Exam  Constitutional: She is oriented to person, place, and time.  She appears well-developed and well-nourished.  HENT:  Head: Normocephalic and atraumatic.  Nose: Nose normal.  Mouth/Throat: Oropharynx is clear and moist. No oropharyngeal exudate.  Eyes: Conjunctivae and EOM are normal. Pupils are equal, round, and reactive to light.  Neck: Normal range of motion. Neck supple. No JVD present. No thyromegaly present.  Cardiovascular: Normal rate, regular rhythm and normal heart sounds.   Pulmonary/Chest: Effort normal and breath sounds normal.  Musculoskeletal: Normal range of motion.  Lymphadenopathy:    She has no cervical adenopathy.  Neurological: She is alert and oriented to person, place, and time. No sensory deficit. She exhibits normal muscle tone.  Skin: Skin is warm and dry. Capillary refill takes less than 2 seconds. No rash noted.  Psychiatric: She has a normal mood and affect. Her behavior is normal.  Vitals reviewed.    ASSESSMENT & PLAN: Katie Gardner was seen today for medication and laryngitis.  Diagnoses and all orders for this visit:  Acute laryngitis Comments: suspected reflux disease  Anxiety Comments: pre-procedure anxiety Orders: -     ALPRAZolam (XANAX) 0.5 MG tablet; Take 1 tablet (0.5 mg total) by mouth 2 (two) times daily as needed for anxiety.    Patient Instructions       IF you received an x-ray today, you will receive an invoice from [facility]. Please contact [facility] at [phone] with questions or concerns regarding your invoice.   IF you received labwork today, you will receive an invoice from [place]. Please contact [facility] at [phone] with questions or concerns regarding your invoice.   Our billing staff will not be able to assist you with questions regarding bills from these companies.  You will be contacted with the lab results as soon as they are available. The fastest way to get your results is to activate your My Chart account. Instructions are located on the last page  of this paperwork. If you have not heard from [place] regarding the results in 2 weeks, please contact this office.      Laryngitis Laryngitis is swelling (inflammation) of your vocal cords. This causes hoarseness, coughing, loss of voice, sore throat, or a dry throat. When your vocal cords are inflamed, your voice sounds different. Laryngitis can be temporary (acute) or long-term (chronic). Most cases of acute laryngitis improve with time. Chronic laryngitis is laryngitis that lasts for more than three weeks. Follow these instructions at home:  Drink enough fluid to keep your pee (urine) clear or pale yellow.  Breathe in moist air. Use a humidifier if you live in a dry climate.  Take medicines only as told by your doctor.  Do not smoke cigarettes or electronic cigarettes. If  you need help quitting, ask your doctor.  Talk as little as possible. Also avoid whispering, which can cause vocal strain.  Write instead of talking. Do this until your voice is back to normal. Contact a doctor if:  You have a fever.  Your pain is worse.  You have trouble swallowing. Get help right away if:  You cough up blood.  You have trouble breathing. This information is not intended to replace advice given to you by your health care provider. Make sure you discuss any questions you have with your health care provider. Document Released: 02/12/2011 Document Revised: 08/01/2015 Document Reviewed: 08/08/2013 Elsevier Interactive Patient Education  2017 Elsevier Inc.      Edwina Barth, Katie Gardner [facility]

## 2016-07-15 NOTE — Patient Instructions (Addendum)
     IF you received an x-ray today, you will receive an invoice from [facility]. Please contact [facility] at [phone] with questions or concerns regarding your invoice.   IF you received labwork today, you will receive an invoice from [facility]. Please contact [facility] at [phone] with questions or concerns regarding your invoice.   Our billing staff will not be able to assist you with questions regarding bills from these companies.  You will be contacted with the lab results as soon as they are available. The fastest way to get your results is to activate your My Chart account. Instructions are located on the last page of this paperwork. If you have not heard from us regarding the results in 2 weeks, please contact this office.      Laryngitis Laryngitis is swelling (inflammation) of your vocal cords. This causes hoarseness, coughing, loss of voice, sore throat, or a dry throat. When your vocal cords are inflamed, your voice sounds different. Laryngitis can be temporary (acute) or long-term (chronic). Most cases of acute laryngitis improve with time. Chronic laryngitis is laryngitis that lasts for more than three weeks. Follow these instructions at home:  Drink enough fluid to keep your pee (urine) clear or pale yellow.  Breathe in moist air. Use a humidifier if you live in a dry climate.  Take medicines only as told by your doctor.  Do not smoke cigarettes or electronic cigarettes. If you need help quitting, ask your doctor.  Talk as little as possible. Also avoid whispering, which can cause vocal strain.  Write instead of talking. Do this until your voice is back to normal. Contact a doctor if:  You have a fever.  Your pain is worse.  You have trouble swallowing. Get help right away if:  You cough up blood.  You have trouble breathing. This information is not intended to replace advice given to you by your health care provider. Make sure you  discuss any questions you have with your health care provider. Document Released: 02/12/2011 Document Revised: 08/01/2015 Document Reviewed: 08/08/2013 Elsevier Interactive Patient Education  2017 [employer]

## 2016-11-13 ENCOUNTER — Ambulatory Visit (INDEPENDENT_AMBULATORY_CARE_PROVIDER_SITE_OTHER): Payer: Medicare Other | Admitting: Emergency Medicine

## 2016-11-13 ENCOUNTER — Ambulatory Visit (INDEPENDENT_AMBULATORY_CARE_PROVIDER_SITE_OTHER): Payer: Medicare Other

## 2016-11-13 ENCOUNTER — Encounter: Payer: Self-pay | Admitting: Emergency Medicine

## 2016-11-13 VITALS — BP 99/73 | HR 81 | Temp 98.3°F | Resp 17 | Ht 61.0 in | Wt 149.0 lb

## 2016-11-13 DIAGNOSIS — F329 Major depressive disorder, single episode, unspecified: Secondary | ICD-10-CM | POA: Diagnosis not present

## 2016-11-13 DIAGNOSIS — R0602 Shortness of breath: Secondary | ICD-10-CM

## 2016-11-13 DIAGNOSIS — J312 Chronic pharyngitis: Secondary | ICD-10-CM | POA: Insufficient documentation

## 2016-11-13 DIAGNOSIS — F32A Depression, unspecified: Secondary | ICD-10-CM

## 2016-11-13 DIAGNOSIS — F419 Anxiety disorder, unspecified: Secondary | ICD-10-CM

## 2016-11-13 LAB — POCT CBC
Granulocyte percent: 52.8 %G (ref 37–80)
HCT, POC: 38.5 % (ref 37.7–47.9)
Hemoglobin: 12.8 g/dL (ref 12.2–16.2)
LYMPH, POC: 2.7 (ref 0.6–3.4)
MCH, POC: 27.7 pg (ref 27–31.2)
MCHC: 33.1 g/dL (ref 31.8–35.4)
MCV: 83.6 fL (ref 80–97)
MID (CBC): 0.3 (ref 0–0.9)
MPV: 7.3 fL (ref 0–99.8)
POC Granulocyte: 3.4 (ref 2–6.9)
POC LYMPH %: 42.8 % (ref 10–50)
POC MID %: 4.4 % (ref 0–12)
Platelet Count, POC: 181 10*3/uL (ref 142–424)
RBC: 4.61 M/uL (ref 4.04–5.48)
RDW, POC: 13.2 %
WBC: 6.4 10*3/uL (ref 4.6–10.2)

## 2016-11-13 MED ORDER — DIAZEPAM 5 MG PO TABS: 5.0000 mg | ORAL_TABLET | Freq: Every day | ORAL | 1 refills | Status: DC | PRN

## 2016-11-13 MED ORDER — LISINOPRIL 2.5 MG PO TABS: 2.5000 mg | ORAL_TABLET | Freq: Every day | ORAL | 3 refills | Status: DC

## 2016-11-13 MED ORDER — SERTRALINE HCL 100 MG PO TABS: 100.0000 mg | ORAL_TABLET | Freq: Every day | ORAL | 3 refills | Status: DC

## 2016-11-13 NOTE — Progress Notes (Signed)
Katie Gardner 61 y.o.   Chief Complaint  Patient presents with  . Shortness of Breath  . Sore Throat    HISTORY OF PRESENT ILLNESS: This is a 61 y.o. female complaining of sore throat x several months and now mild intermittent episodes of SOB.  Shortness of Breath  This is a recurrent problem. The current episode started 1 to 4 weeks ago. The problem occurs intermittently. The problem has been waxing and waning. Associated symptoms include a sore throat (chronic). Pertinent negatives include no abdominal pain, chest pain, claudication, ear pain, fever, headaches, hemoptysis, leg pain, leg swelling, neck pain, orthopnea, PND, rash, rhinorrhea, sputum production, syncope, vomiting or wheezing. Nothing aggravates the symptoms. The patient has no known risk factors for DVT/PE. She has tried nothing for the symptoms. There is no history of asthma, CAD, chronic lung disease, COPD, DVT, a heart failure, PE or pneumonia.     [name]   Medication Sig Start Date End Date Taking? Authorizing Provider  ALPRAZolam Prudy Feeler(XANAX) 0.5 MG tablet Take 1 tablet (0.5 mg total) by mouth 2 (two) times daily as needed for anxiety. 07/15/16  Yes Markeda Narvaez, Eilleen KempfMiguel Jose, MD  conjugated estrogens (PREMARIN) vaginal cream Place 1 Applicatorful vaginally daily. 04/14/16  Yes Jeffery Gammell, Eilleen KempfMiguel Jose, MD  cyclobenzaprine (FLEXERIL) 10 MG tablet Take 10 mg by mouth 3 (three) times daily as needed for muscle spasms.   Yes [provider]  diazepam (VALIUM) 5 MG tablet Take 1 tablet (5 mg total) by mouth daily as needed for anxiety. 04/14/16  Yes Nohealani Medinger, Eilleen KempfMiguel Jose, MD  HYDROcodone-acetaminophen (NORCO/VICODIN) 5-325 MG tablet Take 1 tablet by mouth every 6 (six) hours as needed for moderate pain.   Yes [provider]  lisinopril (PRINIVIL,ZESTRIL) 2.5 MG tablet Take 1 tablet (2.5 mg total) by mouth daily. 04/14/16 11/13/16 Yes Velmer Woelfel, Eilleen KempfMiguel Jose, MD  methocarbamol (ROBAXIN) 500 MG tablet Take 1  tablet (500 mg total) by mouth 2 (two) times daily. 03/16/13  Yes Santiago GladLaisure, Heather, PA-C  sertraline (ZOLOFT) 100 MG tablet Take 1 tablet (100 mg total) by mouth daily. 04/14/16 11/13/16 Yes Carlosdaniel Grob, Eilleen KempfMiguel Jose, MD  simvastatin (ZOCOR) 40 MG tablet Take 1 tablet (40 mg total) by mouth daily. 04/14/16 11/13/16 Yes Caralynn Gelber, Eilleen KempfMiguel Jose, MD  traMADol (ULTRAM) 50 MG tablet Take 1 tablet (50 mg total) by mouth every 6 (six) hours as needed. 03/16/13  Yes Santiago GladLaisure, Heather, PA-C    Allergies  Allergen Reactions  . Hydrocodone Nausea And Vomiting    dizzy  . Mucinex Dm [Dm-Guaifenesin Er] Swelling    Sob, throat closing    Patient Active Problem List   Diagnosis Date Noted  . Acute laryngitis 07/15/2016  . Chest pain   . Hot flashes   . Hematuria   . Allergy   . Anxiety   . Hyperlipidemia     Past Medical History:  Diagnosis Date  . Allergy    seasonal  . Anxiety   . Depression   . Hematuria   . Hot flashes   . Hx of cardiovascular stress test    Lex MV 12/13:  EF 68%, mild apical thinning, no ischemia  . Hyperlipidemia     Past Surgical History:  Procedure Laterality Date  . BREAST LUMPECTOMY     right breast, not cancer  . Carpel tunnel    . ECTOPIC PREGNANCY SURGERY      Social History   Social History  . Marital status: Legally Separated    Spouse name:  N/A  . Number of children: 1  . Years of education: N/A   Occupational History  .  Unemployed   Social History Main Topics  . Smoking status: Former Games developer  . Smokeless tobacco: Never Used  . Alcohol use No  . Drug use: No  . Sexual activity: No   Other Topics Concern  . Not on file   Social History Narrative  . No narrative on file    Family History  Problem Relation Age of Onset  . Heart disease Mother   . Diabetes Mother   . Esophageal cancer Father        72's  . Colon cancer Neg Hx      Review of Systems  Constitutional: Negative for chills, fever, malaise/fatigue and weight loss.  HENT:  Positive for sore throat (chronic). Negative for ear pain, nosebleeds and rhinorrhea.   Eyes: Negative.  Negative for blurred vision and double vision.  Respiratory: Positive for shortness of breath. Negative for cough, hemoptysis, sputum production and wheezing.   Cardiovascular: Negative for chest pain, palpitations, orthopnea, claudication, leg swelling, syncope and PND.  Gastrointestinal: Negative.  Negative for abdominal pain, diarrhea, nausea and vomiting.  Genitourinary: Negative.  Negative for hematuria.  Musculoskeletal: Negative for back pain, myalgias and neck pain.  Skin: Negative for rash.  Neurological: Negative.  Negative for dizziness, sensory change, focal weakness and headaches.  Endo/Heme/Allergies: Negative.   All other systems reviewed and are negative.  Vitals:   11/13/16 1320  BP: 99/73  Pulse: 81  Resp: 17  Temp: 98.3 F (36.8 C)  SpO2: 98%     Physical Exam  Constitutional: She is oriented to person, place, and time. She appears well-developed and well-nourished.  HENT:  Head: Normocephalic and atraumatic.  Nose: Nose normal.  Mouth/Throat: Oropharynx is clear and moist. No oropharyngeal exudate.  Eyes: Pupils are equal, round, and reactive to light. Conjunctivae and EOM are normal.  Neck: Normal range of motion. Neck supple. No JVD present. No thyromegaly present.  Cardiovascular: Normal rate, regular rhythm, normal heart sounds and intact distal pulses.   Pulmonary/Chest: Effort normal and breath sounds normal. No respiratory distress. She has no wheezes. She has no rales.  Abdominal: Soft. Bowel sounds are normal. She exhibits no distension and no mass. There is no tenderness.  Musculoskeletal: Normal range of motion. She exhibits no edema or tenderness.  Lymphadenopathy:    She has no cervical adenopathy.  Neurological: She is alert and oriented to person, place, and time. No sensory deficit. She exhibits normal muscle tone.  Skin: Skin is warm and  dry. Capillary refill takes less than 2 seconds. No rash noted.  Psychiatric: She has a normal mood and affect. Her behavior is normal.  Vitals reviewed.  Results for orders placed or performed in visit on 11/13/16 (from the past 24 hour(s))  POCT CBC     Status: None   Collection Time: 11/13/16  1:56 PM  Result Value Ref Range   WBC 6.4 4.6 - 10.2 K/uL   Lymph, poc 2.7 0.6 - 3.4   POC LYMPH PERCENT 42.8 10 - 50 %L   MID (cbc) 0.3 0 - 0.9   POC MID % 4.4 0 - 12 %M   POC Granulocyte 3.4 2 - 6.9   Granulocyte percent 52.8 37 - 80 %G   RBC 4.61 4.04 - 5.48 M/uL   Hemoglobin 12.8 12.2 - 16.2 g/dL   HCT, POC 16.1 09.6 - 47.9 %   MCV 83.6  80 - 97 fL   MCH, POC 27.7 27 - 31.2 pg   MCHC 33.1 31.8 - 35.4 g/dL   RDW, POC 16.1 %   Platelet Count, POC 181 142 - 424 K/uL   MPV 7.3 0 - 99.8 fL  CXR: NAD reviewed by me with patient. Dg Chest 2 View  Result Date: 11/13/2016 CLINICAL DATA:  Shortness of Breath EXAM: CHEST  2 VIEW COMPARISON:  August 16, 2010 FINDINGS: There is stable scarring in the left base. Lungs elsewhere clear. Heart size and pulmonary vascularity are normal. No adenopathy. There is lower thoracic dextroscoliosis. IMPRESSION: Stable scarring left base.  No edema or consolidation. Electronically Signed   By: Bretta Bang III M.D.   On: 11/13/2016 14:14     EKG: NSR, no acute ischemic changes.  ASSESSMENT & PLAN: Teya was seen today for shortness of breath and sore throat.  Diagnoses and all orders for this visit:  Shortness of breath -     POCT CBC -     Comprehensive metabolic panel -     Lipid panel -     TSH -     DG Chest 2 View; Future -     EKG 12-Lead  Depression, unspecified depression type  Chronic anxiety -     diazepam (VALIUM) 5 MG tablet; Take 1 tablet (5 mg total) by mouth daily as needed for anxiety.  Chronic sore throat -     Ambulatory referral to ENT  Other orders -     lisinopril (PRINIVIL,ZESTRIL) 2.5 MG tablet; Take 1 tablet (2.5 mg  total) by mouth daily. -     sertraline (ZOLOFT) 100 MG tablet; Take 1 tablet (100 mg total) by mouth daily.    Patient Instructions       IF you received an x-ray today, you will receive an invoice from [facility]. Please contact [facility] at [phone] with questions or concerns regarding your invoice.   IF you received labwork today, you will receive an invoice from [place]. Please contact [facility] at [phone] with questions or concerns regarding your invoice.   Our billing staff will not be able to assist you with questions regarding bills from these companies.  You will be contacted with the lab results as soon as they are available. The fastest way to get your results is to activate your My Chart account. Instructions are located on the last page of this paperwork. If you have not heard from [place] regarding the results in 2 weeks, please contact this office.      Falta de aire (Shortness of Breath) Falta de aire significa que tiene dificultad para respirar. Es necesario que reciba atencin mdica de inmediato. CUIDADOS EN EL HOGAR  No fume.  Evite estar cerca de sustancias qumicas (vapores de pintura, polvo) que puedan dificultar su respiracin.  Descanse todo lo que sea necesario. Retome lentamente sus actividades normales.  Tome solo los medicamentos segn le haya indicado el mdico.  Cumpla con las visitas al mdico segn las indicaciones.  SOLICITE AYUDA DE INMEDIATO SI:  La falta de aire empeora.  Tiene mareos, pierde el conocimiento (se desmaya) o tiene tos que no mejora con medicamentos.  Tose y escupe sangre.  Siente dolor al respirar.  Tiene dolor en el pecho, los brazos, los hombros o el vientre (abdomen).  Tiene fiebre.  No puede subir escaleras o realizar ejercicio del modo en que lo haca habitualmente.  No mejora como se esperaba.  [employer]  actividades normales, aun si ha descansado lo suficiente.  Tiene  problemas con los medicamentos.  Aparece algn sntoma nuevo.  ASEGRESE DE QUE:  Comprende estas instrucciones.  Controlar su afeccin.  Recibir ayuda de inmediato si no mejora o si empeora.  Esta informacin no tiene Theme park manager el consejo del mdico. Asegrese de hacerle al mdico cualquier pregunta que tenga. Document Released: 08/13/2009 Document Revised: 02/28/2013 Document Reviewed: 08/01/2015 Elsevier Interactive Patient Education  2017 [employer].  Shortness of Breath, Adult Shortness of breath means you have trouble breathing. Your lungs are organs for breathing. Follow these instructions at home: Pay attention to any changes in your symptoms. Take these actions to help with your condition:  Do not smoke. Smoking can cause shortness of breath. If you need help to quit smoking, ask your doctor.  Avoid things that can make it harder to breathe, such as: ? Mold. ? Dust. ? Air pollution. ? Chemical smells. ? Things that can cause allergy symptoms (allergens), if you have allergies.  Keep your living space clean and free of mold and dust.  Rest as needed. Slowly return to your usual activities.  Take over-the-counter and prescription medicines, including oxygen and inhaled medicines, only as told by your doctor.  Keep all follow-up visits as told by your doctor. This is important.  Contact a doctor if:  Your condition does not get better as soon as expected.  You have a hard time doing your normal activities, even after you rest.  You have new symptoms. Get help right away if:  You have trouble breathing when you are resting.  You feel light-headed or you faint.  You have a cough that is not helped by medicines.  You cough up blood.  You have pain with breathing.  You have pain in your chest, arms, shoulders, or belly (abdomen).  You have a fever.  You cannot walk up stairs.  You cannot exercise the way you normally do. This  information is not intended to replace advice given to you by your health care provider. Make sure you discuss any questions you have with your health care provider. Document Released: 08/12/2007 Document Revised: 03/12/2016 Document Reviewed: 03/12/2016 Elsevier Interactive Patient Education  2017 Elsevier Inc.      Edwina Barth, MD [facility]

## 2016-11-13 NOTE — Patient Instructions (Addendum)
IF you received an x-ray today, you will receive an invoice from [facility]. Please contact [facility] at [phone] with questions or concerns regarding your invoice.   IF you received labwork today, you will receive an invoice from [place]. Please contact [facility] at [phone] with questions or concerns regarding your invoice.   Our billing staff will not be able to assist you with questions regarding bills from these companies.  You will be contacted with the lab results as soon as they are available. The fastest way to get your results is to activate your My Chart account. Instructions are located on the last page of this paperwork. If you have not heard from [place] regarding the results in 2 weeks, please contact this office.      Falta de aire (Shortness of Breath) Falta de aire significa que tiene dificultad para respirar. Es necesario que reciba atencin mdica de inmediato. CUIDADOS EN EL HOGAR  No fume.  Evite estar cerca de sustancias qumicas (vapores de pintura, polvo) que puedan dificultar su respiracin.  Descanse todo lo que sea necesario. Retome lentamente sus actividades normales.  Tome solo los medicamentos segn le haya indicado el mdico.  Cumpla con las visitas al mdico segn las indicaciones.  SOLICITE AYUDA DE INMEDIATO SI:  La falta de aire empeora.  Tiene mareos, pierde el conocimiento (se desmaya) o tiene tos que no mejora con medicamentos.  Tose y escupe sangre.  Siente dolor al respirar.  Tiene dolor en el pecho, los brazos, los hombros o el vientre (abdomen).  Tiene fiebre.  No puede subir escaleras o realizar ejercicio del modo en que lo haca habitualmente.  No mejora como se esperaba.  Le cuesta hacer las actividades normales, aun si ha descansado lo suficiente.  Tiene problemas con los medicamentos.  Aparece algn sntoma nuevo.  ASEGRESE DE QUE:  Comprende estas instrucciones.  Controlar su  afeccin.  Recibir ayuda de inmediato si no mejora o si empeora.  Esta informacin no tiene Theme park manager el consejo del mdico. Asegrese de hacerle al mdico cualquier pregunta que tenga. Document Released: 08/13/2009 Document Revised: 02/28/2013 Document Reviewed: 08/01/2015 Elsevier Interactive Patient Education  2017 [employer].  Shortness of Breath, Adult Shortness of breath means you have trouble breathing. Your lungs are organs for breathing. Follow these instructions at home: Pay attention to any changes in your symptoms. Take these actions to help with your condition:  Do not smoke. Smoking can cause shortness of breath. If you need help to quit smoking, ask your doctor.  Avoid things that can make it harder to breathe, such as: ? Mold. ? Dust. ? Air pollution. ? Chemical smells. ? Things that can cause allergy symptoms (allergens), if you have allergies.  Keep your living space clean and free of mold and dust.  Rest as needed. Slowly return to your usual activities.  Take over-the-counter and prescription medicines, including oxygen and inhaled medicines, only as told by your doctor.  Keep all follow-up visits as told by your doctor. This is important.  Contact a doctor if:  Your condition does not get better as soon as expected.  You have a hard time doing your normal activities, even after you rest.  You have new symptoms. Get help right away if:  You have trouble breathing when you are resting.  You feel light-headed or you faint.  You have a cough that is not helped by medicines.  You cough up blood.  You have pain with  breathing.  You have pain in your chest, arms, shoulders, or belly (abdomen).  You have a fever.  You cannot walk up stairs.  You cannot exercise the way you normally do. This information is not intended to replace advice given to you by your health care provider. Make sure you discuss any questions you have with your  health care provider. Document Released: 08/12/2007 Document Revised: 03/12/2016 Document Reviewed: 03/12/2016 Elsevier Interactive Patient Education  2017 [employer]

## 2016-11-14 LAB — COMPREHENSIVE METABOLIC PANEL
A/G RATIO: 1.8 (ref 1.2–2.2)
ALK PHOS: 102 IU/L (ref 39–117)
ALT: 20 IU/L (ref 0–32)
AST: 23 IU/L (ref 0–40)
Albumin: 4.5 g/dL (ref 3.6–4.8)
BUN/Creatinine Ratio: 23 (ref 12–28)
BUN: 15 mg/dL (ref 8–27)
Bilirubin Total: <0.2 mg/dL (ref 0.0–1.2)
CALCIUM: 9.4 mg/dL (ref 8.7–10.3)
CO2: 22 mmol/L (ref 20–29)
Chloride: 103 mmol/L (ref 96–106)
Creatinine, Ser: 0.65 mg/dL (ref 0.57–1.00)
GFR calc Af Amer: 112 mL/min/{1.73_m2} (ref >59)
GFR, EST NON AFRICAN AMERICAN: 97 mL/min/{1.73_m2} (ref >59)
Globulin, Total: 2.5 g/dL (ref 1.5–4.5)
Glucose: 98 mg/dL (ref 65–99)
POTASSIUM: 4.3 mmol/L (ref 3.5–5.2)
Sodium: 141 mmol/L (ref 134–144)
Total Protein: 7 g/dL (ref 6.0–8.5)

## 2016-11-14 LAB — LIPID PANEL
CHOL/HDL RATIO: 3 ratio (ref 0.0–4.4)
Cholesterol, Total: 163 mg/dL (ref 100–199)
HDL: 54 mg/dL (ref >39)
LDL Calculated: 83 mg/dL (ref 0–99)
TRIGLYCERIDES: 129 mg/dL (ref 0–149)
VLDL Cholesterol Cal: 26 mg/dL (ref 5–40)

## 2016-11-14 LAB — TSH: TSH: 1.22 u[IU]/mL (ref 0.450–4.500)

## 2016-11-16 ENCOUNTER — Telehealth: Payer: Self-pay | Admitting: Emergency Medicine

## 2016-11-16 NOTE — Telephone Encounter (Signed)
Pt is has a language barrier and is trying to make sure the she is getting the right dosage and miligram of a medicaiton they have changed color pt did not know the name of meds

## 2016-11-24 ENCOUNTER — Telehealth: Payer: Self-pay | Admitting: Emergency Medicine

## 2016-11-24 NOTE — Telephone Encounter (Signed)
Pt has correct medication and is aware.

## 2016-11-24 NOTE — Telephone Encounter (Signed)
Pharmacy is calling to check on the status of a refill request for some new creams for the pt.  pts reference number: [id number] Direct line to Kandis Mannan (the pharmacist) [phone]

## 2016-11-24 NOTE — Telephone Encounter (Signed)
Called "pharmacy". No record of this "mail order" pharmacy in patients chart. No record of medication requested. Advised pharmacy to contact patient and have them be seen if they would like medication filled.

## 2016-12-02 ENCOUNTER — Telehealth: Payer: Self-pay | Admitting: *Deleted

## 2016-12-02 NOTE — Telephone Encounter (Signed)
Faxed signed Rxs to [facility] for Lidocaine 5% ointment and Fluocinonide 0.1% cream. Confirmation page received at 2:06 pm.

## 2016-12-07 ENCOUNTER — Telehealth: Payer: Self-pay | Admitting: Emergency Medicine

## 2016-12-07 ENCOUNTER — Other Ambulatory Visit: Payer: Self-pay

## 2016-12-07 NOTE — Patient Outreach (Signed)
[facility]) Care Management  12/07/2016  Dnyla Antonetti 02/08/1956 [id number]    Medication adherence call to Mrs. Josephine Cables the reason for this call is because Mrs. Tarver is showing past due under [facility] Ins.on her simvastatin 40 mg spoke to patient she wants me to call [employer] and doctor [facility]). for refill  I call the pharmacy & Dr. York Ram patient did not have any refill, left a message with doctors nurse they will call in the refill with in 72 hours ,call Mrs. Senteno back and explain to her that she will get a callback from the doctor's office when the prescription is ready for her.(pt. Is more comfortable  speak spanish)    Lillia Abed CPhT Pharmacy Technician [facility] Care Management Direct Dial [phone]  Fax [phone] [email or web address]

## 2016-12-07 NOTE — Telephone Encounter (Signed)
PATIENT IS REQUESTING A REFILL ON HER SIMVASTATIN (ZOCOR) 40 MG FROM DR. SAGARDIA. ([facility] CALLED FOR HER). BEST PHONE [phone] (CELL) PHARMACY CHOICE IS [facility] ON [address] MBC

## 2016-12-08 ENCOUNTER — Other Ambulatory Visit: Payer: Self-pay | Admitting: Emergency Medicine

## 2016-12-08 ENCOUNTER — Other Ambulatory Visit: Payer: Self-pay | Admitting: *Deleted

## 2016-12-08 MED ORDER — SIMVASTATIN 40 MG PO TABS: 40.0000 mg | ORAL_TABLET | Freq: Every day | ORAL | 0 refills | Status: DC

## 2016-12-23 ENCOUNTER — Telehealth: Payer: Self-pay | Admitting: *Deleted

## 2016-12-23 NOTE — Telephone Encounter (Signed)
Faxed signed Rx Fluocinonide 1.0% cream. Confirmation page received at 5:23 pm.

## 2016-12-24 ENCOUNTER — Telehealth: Payer: Self-pay | Admitting: Family Medicine

## 2016-12-24 NOTE — Telephone Encounter (Signed)
[facility] called and stll hasn't received the Rx for Fluocininide 1.0% cream they would like for Katie Gardner to refax it to [phone]

## 2016-12-28 NOTE — Telephone Encounter (Signed)
Unfortunately, I don't and I couldn't find it under her list or my notes either.

## 2016-12-28 NOTE — Telephone Encounter (Signed)
I do not see this under her med list. Do you know anything about this?

## 2017-01-01 ENCOUNTER — Other Ambulatory Visit: Payer: Self-pay | Admitting: Physician Assistant

## 2017-01-01 ENCOUNTER — Ambulatory Visit (INDEPENDENT_AMBULATORY_CARE_PROVIDER_SITE_OTHER): Payer: Medicare Other | Admitting: Physician Assistant

## 2017-01-01 ENCOUNTER — Encounter: Payer: Self-pay | Admitting: Physician Assistant

## 2017-01-01 VITALS — BP 127/80 | HR 69 | Resp 16 | Ht 61.0 in | Wt 152.8 lb

## 2017-01-01 DIAGNOSIS — R0781 Pleurodynia: Secondary | ICD-10-CM | POA: Diagnosis not present
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
## 2017-01-02 ENCOUNTER — Ambulatory Visit: Payer: Medicare Other | Admitting: Family Medicine

## 2017-01-06 ENCOUNTER — Telehealth: Payer: Self-pay | Admitting: *Deleted

## 2017-01-06 NOTE — Telephone Encounter (Signed)
Faxed signed Rxs for sterile alcohol prep pads and calcipotriene cream to [facility]. Confirmation page received at 5:07 pm.

## 2017-01-12 ENCOUNTER — Other Ambulatory Visit: Payer: Self-pay | Admitting: Emergency Medicine

## 2017-01-15 ENCOUNTER — Ambulatory Visit (INDEPENDENT_AMBULATORY_CARE_PROVIDER_SITE_OTHER): Payer: Medicare Other | Admitting: Emergency Medicine

## 2017-01-15 ENCOUNTER — Encounter: Payer: Self-pay | Admitting: Emergency Medicine

## 2017-01-15 ENCOUNTER — Other Ambulatory Visit: Payer: Self-pay

## 2017-01-15 ENCOUNTER — Ambulatory Visit (INDEPENDENT_AMBULATORY_CARE_PROVIDER_SITE_OTHER): Payer: Medicare Other

## 2017-01-15 VITALS — BP 134/70 | HR 72 | Temp 98.5°F | Resp 16 | Ht 60.25 in | Wt 150.4 lb

## 2017-01-15 DIAGNOSIS — S20211S Contusion of right front wall of thorax, sequela: Secondary | ICD-10-CM

## 2017-01-15 DIAGNOSIS — R0781 Pleurodynia: Secondary | ICD-10-CM | POA: Diagnosis not present

## 2017-01-15 DIAGNOSIS — R0789 Other chest pain: Secondary | ICD-10-CM | POA: Diagnosis not present

## 2017-01-15 DIAGNOSIS — S20211A Contusion of right front wall of thorax, initial encounter: Secondary | ICD-10-CM | POA: Insufficient documentation

## 2017-01-15 NOTE — Assessment & Plan Note (Signed)
Improving without complications.

## 2017-01-15 NOTE — Assessment & Plan Note (Signed)
Well controlled; advised to take Tylenol as needed.

## 2017-01-15 NOTE — Patient Instructions (Addendum)
We recommend that you schedule a mammogram for breast cancer screening. Typically, you do not need a referral to do this. Please contact a local imaging center to schedule your mammogram.  [facility] - [phone]  *ask for the Radiology Department [facility] - [phone] or [phone]  [facility] - [phone] [facility] - [phone] [facility] - [phone]  *ask for the Radiology Department [facility] - [phone]  *ask for the Radiology Department [facility] - [phone]) [phone]  *ask for the Mammography Department [facility] - [phone]) [phone]    IF you received an x-ray today, you will receive an invoice from [facility]. Please contact [facility] at [phone] with questions or concerns regarding your invoice.   IF you received labwork today, you will receive an invoice from [facility]. Please contact [facility] at [phone] with questions or concerns regarding your invoice.   Our billing staff will not be able to assist you with questions regarding bills from these companies.  You will be contacted with the lab results as soon as they are available. The fastest way to get your results is to activate your My Chart account. Instructions are located on the last page of this paperwork. If you have not heard from us regarding the results in 2 weeks, please contact this office.     Chest Contusion, Adult A chest contusion is a deep bruise on the chest. Bruises happen when an injury causes bleeding under the skin. Signs of bruising include pain, puffiness (swelling), and skin that has changed from its normal color. The bruise may turn blue, purple, or yellow. Minor injuries may give you a painless bruise, but worse bruises may stay painful and swollen for a few weeks. Follow these instructions at home:  If directed, put ice on the injured  area. ? Put ice in a plastic bag. ? Place a towel between your skin and the bag. ? Leave the ice on for 20 minutes, 2-3 times per day.  Take over-the-counter and prescription medicines only as told by your doctor.  If told by your doctor, do deep-breathing exercises.  Do not lie down flat on your back. Keep your head and chest raised (elevated) when you rest or sleep.  Do not use any products that contain nicotine or tobacco, such as cigarettes and e-cigarettes. If you need help quitting, ask your doctor.  Do not lift anything that causes pain. Contact a doctor if:  Medicines or treatment do not help your swelling or pain.  You have more bruising.  You have more swelling.  Your pain gets worse  Your symptoms do not get better in one week. Get help right away if:  You suddenly have a lot more pain.  You have trouble breathing.  You feel dizzy or weak.  You pass out (faint).  You have blood in your pee (urine) or poop (stool).  You cough up blood or you throw up (vomit) blood. Summary  A chest contusion is a deep bruise on the chest. Bruises happen when an injury causes bleeding under the skin.  Treatment may include resting and putting ice on the injured area.  Contact a doctor if you have trouble breathing or if your pain does not get better with treatment. This information is not intended to replace advice given to you by your health care provider. Make sure you discuss any questions you have with your health care  provider. Document Released: 08/12/2007 Document Revised: 11/21/2015 Document Reviewed: 11/21/2015 Elsevier Interactive Patient Education  2017 [employer]

## 2017-01-15 NOTE — Progress Notes (Signed)
Katie Gardner 61 y.o.   Chief Complaint  Patient presents with  . Follow-up    01/01/2017 - [facility]    HISTORY OF PRESENT ILLNESS: This is a 61 y.o. female complaining of right sided chest wall pain from injury sustained 2 weeks ago; seen here by PA Clark on 10/26; his note reviewed by me. No imaging done then. Pt c/o persistent pain although better than before. No new symptoms. Pain is sharp and worse with movement; denies fever, SOB, n/v, or any other significant symptoms.  HPI   [name]   Medication Sig Start Date End Date Taking? Authorizing Provider  cyclobenzaprine (FLEXERIL) 10 MG tablet Take 10 mg by mouth 3 (three) times daily as needed for muscle spasms.   Yes [provider]  diazepam (VALIUM) 5 MG tablet Take 1 tablet (5 mg total) by mouth daily as needed for anxiety. 11/13/16  Yes Severino Paolo, Eilleen KempfMiguel Jose, MD  sertraline (ZOLOFT) 100 MG tablet Take 1 tablet (100 mg total) by mouth daily. 11/13/16 02/11/17 Yes Georgina QuintSagardia, Cristian Davitt Jose, MD  simvastatin (ZOCOR) 40 MG tablet TAKE 1 TABLET(40 MG) BY MOUTH DAILY 01/12/17  Yes Georgina QuintSagardia, Kawthar Ennen Jose, MD    Allergies  Allergen Reactions  . Hydrocodone Nausea And Vomiting    dizzy  . Mucinex Dm [Dm-Guaifenesin Er] Swelling    Sob, throat closing    Patient Active Problem List   Diagnosis Date Noted  . Shortness of breath 11/13/2016  . Depression 11/13/2016  . Chronic anxiety 11/13/2016  . Chronic sore throat 11/13/2016  . Acute laryngitis 07/15/2016  . Chest pain   . Hot flashes   . Hematuria   . Allergy   . Anxiety   . Hyperlipidemia     Past Medical History:  Diagnosis Date  . Allergy    seasonal  . Anxiety   . Depression   . Hematuria   . Hot flashes   . Hx of cardiovascular stress test    Lex MV 12/13:  EF 68%, mild apical thinning, no ischemia  . Hyperlipidemia     Past Surgical History:  Procedure Laterality Date  . BREAST  LUMPECTOMY     right breast, not cancer  . Carpel tunnel    . ECTOPIC PREGNANCY SURGERY      Social History   Socioeconomic History  . Marital status: Legally Separated    Spouse name: Not on file  . Number of children: 1  . Years of education: Not on file  . Highest education level: Not on file  Social Needs  . Financial resource strain: Not on file  . Food insecurity - worry: Not on file  . Food insecurity - inability: Not on file  . Transportation needs - medical: Not on file  . Transportation needs - non-medical: Not on file  Occupational History    Employer: UNEMPLOYED  Tobacco Use  . Smoking status: Former Games developermoker  . Smokeless tobacco: Never Used  Substance and Sexual Activity  . Alcohol use: No  . Drug use: No  . Sexual activity: No  Other Topics Concern  . Not on file  Social History Narrative  . Not on file    Family History  Problem Relation Age of Onset  . Heart disease Mother   . Diabetes Mother   . Esophageal cancer Father        3870's  . Colon cancer Neg Hx      Review  of Systems  Constitutional: Negative.  Negative for chills and fever.  HENT: Negative.  Negative for congestion, nosebleeds and sore throat.   Eyes: Negative.  Negative for blurred vision, double vision, discharge and redness.  Respiratory: Negative.  Negative for cough, hemoptysis and shortness of breath.   Cardiovascular: Negative.  Negative for chest pain and leg swelling.  Gastrointestinal: Negative for abdominal pain, diarrhea, nausea and vomiting.  Genitourinary: Negative.  Negative for hematuria.  Musculoskeletal:       Right sided rib pain  Skin: Negative.  Negative for rash.  Neurological: Negative.   Endo/Heme/Allergies: Negative.   All other systems reviewed and are negative.  Vitals:   01/15/17 1108  BP: 134/70  Pulse: 72  Resp: 16  Temp: 98.5 F (36.9 C)  SpO2: 98%     Physical Exam  Constitutional: She is oriented to person, place, and time. She appears  well-developed and well-nourished.  HENT:  Head: Normocephalic and atraumatic.  Nose: Nose normal.  Mouth/Throat: Oropharynx is clear and moist.  Eyes: Conjunctivae and EOM are normal. Pupils are equal, round, and reactive to light.  Neck: Normal range of motion. Neck supple. No thyromegaly present.  Cardiovascular: Regular rhythm, normal heart sounds and intact distal pulses.  Pulmonary/Chest: Effort normal and breath sounds normal. No respiratory distress. She exhibits tenderness (right posterio-lateral rib cage).  Abdominal: Soft. She exhibits no distension. There is no tenderness.  Musculoskeletal: Normal range of motion.  Lymphadenopathy:    She has no cervical adenopathy.  Neurological: She is alert and oriented to person, place, and time. No sensory deficit. She exhibits normal muscle tone.  Skin: Skin is warm and dry. Capillary refill takes less than 2 seconds. No rash noted.  Psychiatric: She has a normal mood and affect. Her behavior is normal.  Vitals reviewed.  Dg Ribs Unilateral W/chest Right  Result Date: 01/15/2017 CLINICAL DATA:  Contusion of the right chest wall. EXAM: RIGHT RIBS AND CHEST - 3+ VIEW COMPARISON:  None. FINDINGS: No fracture or other bone lesions are seen involving the ribs. There is no evidence of pneumothorax or pleural effusion. Both lungs are clear. Heart size and mediastinal contours are within normal limits. IMPRESSION: Negative. Electronically Signed   By: Ted Mcalpine M.D.   On: 01/15/2017 11:50    Rib pain Well controlled; advised to take Tylenol as needed.  Contusion of right chest wall Improving without complications.   ASSESSMENT & PLAN: Kaileia was seen today for follow-up.  Diagnoses and all orders for this visit:  Rib pain  Contusion of right chest wall, sequela -     DG Ribs Unilateral W/Chest Right; Future     Patient Instructions   We recommend that you schedule a mammogram for breast cancer screening. Typically,  you do not need a referral to do this. Please contact a local imaging center to schedule your mammogram.  [facility] - [phone]  *ask for the Radiology Department [facility]) - [phone] or [phone]  [facility] - [phone] [facility] - [phone] [facility] - [phone]  *ask for the Radiology Department [facility] - [phone]  *ask for the Radiology Department [facility] - [phone]  *ask for the Mammography Department [facility] - [phone]    IF you received an x-ray today, you will receive an invoice from [facility] Rad[facility]. Please contact [facility] at [phone] with questions or concerns regarding your invoice.  IF you received labwork today, you will receive an invoice from [facility]. Please contact [facility] at [phone] with questions or concerns regarding your invoice.   Our billing staff will not be able to assist you with questions regarding bills from these companies.  You will be contacted with the lab results as soon as they are available. The fastest way to get your results is to activate your My Chart account. Instructions are located on the last page of this paperwork. If you have not heard from us regarding the results in 2 weeks, please contact this office.     Chest Contusion, Adult A chest contusion is a deep bruise on the chest. Bruises happen when an injury causes bleeding under the skin. Signs of bruising include pain, puffiness (swelling), and skin that has changed from its normal color. The bruise may turn blue, purple, or yellow. Minor injuries may give you a painless bruise, but worse bruises may stay painful and swollen for a few weeks. Follow these instructions at home:  If directed, put ice on the injured area. ? Put ice in a plastic bag. ? Place a towel between your skin and the bag. ? Leave  the ice on for 20 minutes, 2-3 times per day.  Take over-the-counter and prescription medicines only as told by your doctor.  If told by your doctor, do deep-breathing exercises.  Do not lie down flat on your back. Keep your head and chest raised (elevated) when you rest or sleep.  Do not use any products that contain nicotine or tobacco, such as cigarettes and e-cigarettes. If you need help quitting, ask your doctor.  Do not lift anything that causes pain. Contact a doctor if:  Medicines or treatment do not help your swelling or pain.  You have more bruising.  You have more swelling.  Your pain gets worse  Your symptoms do not get better in one week. Get help right away if:  You suddenly have a lot more pain.  You have trouble breathing.  You feel dizzy or weak.  You pass out (faint).  You have blood in your pee (urine) or poop (stool).  You cough up blood or you throw up (vomit) blood. Summary  A chest contusion is a deep bruise on the chest. Bruises happen when an injury causes bleeding under the skin.  Treatment may include resting and putting ice on the injured area.  Contact a doctor if you have trouble breathing or if your pain does not get better with treatment. This information is not intended to replace advice given to you by your health care provider. Make sure you discuss any questions you have with your health care provider. Document Released: 08/12/2007 Document Revised: 11/21/2015 Document Reviewed: 11/21/2015 Elsevier Interactive Patient Education  2017 Elsevier Inc.     Edwina BarthMiguel Jaeden Messer, MD [facility]

## 2017-04-12 ENCOUNTER — Other Ambulatory Visit: Payer: Self-pay

## 2017-04-12 ENCOUNTER — Ambulatory Visit (INDEPENDENT_AMBULATORY_CARE_PROVIDER_SITE_OTHER): Payer: Medicare Other

## 2017-04-12 ENCOUNTER — Encounter: Payer: Self-pay | Admitting: Emergency Medicine

## 2017-04-12 ENCOUNTER — Ambulatory Visit (INDEPENDENT_AMBULATORY_CARE_PROVIDER_SITE_OTHER): Payer: Medicare (Managed Care) | Admitting: Emergency Medicine

## 2017-04-12 VITALS — BP 122/78 | HR 80 | Ht 60.0 in | Wt 148.1 lb

## 2017-04-12 VITALS — BP 140/68 | HR 78 | Temp 98.1°F | Resp 16 | Ht 60.25 in | Wt 149.0 lb

## 2017-04-12 DIAGNOSIS — Z0000 Encounter for general adult medical examination without abnormal findings: Secondary | ICD-10-CM

## 2017-04-12 DIAGNOSIS — M79631 Pain in right forearm: Secondary | ICD-10-CM

## 2017-04-12 DIAGNOSIS — M778 Other enthesopathies, not elsewhere classified: Secondary | ICD-10-CM

## 2017-04-12 MED ORDER — DICLOFENAC SODIUM 75 MG PO TBEC: 75.0000 mg | DELAYED_RELEASE_TABLET | Freq: Two times a day (BID) | ORAL | 0 refills | Status: DC

## 2017-04-12 NOTE — Progress Notes (Signed)
Subjective:   Katie Gardner is a 62 y.o. female who presents for an Initial Medicare Annual Wellness Visit.  Review of Systems    N/A  Cardiac Risk Factors include: dyslipidemia;sedentary lifestyle     Objective:    Today's Vitals   04/12/17 1437 04/12/17 1447  BP: 122/78   Pulse: 80   SpO2: 95%   Weight: 148 lb 2 oz (67.2 kg)   Height: 5' (1.524 m)   PainSc:  2    Body mass index is 28.93 kg/m.  Advanced Directives 04/12/2017  Does Patient Have a Medical Advance Directive? No  Would patient like information on creating a medical advance directive? Yes (MAU/Ambulatory/Procedural Areas - Information given)    Current Medications (verified) Outpatient Encounter Medications as of 04/12/2017  Medication Sig  . cyclobenzaprine (FLEXERIL) 10 MG tablet Take 10 mg by mouth 3 (three) times daily as needed for muscle spasms.  . diazepam (VALIUM) 5 MG tablet Take 1 tablet (5 mg total) by mouth daily as needed for anxiety.  . simvastatin (ZOCOR) 40 MG tablet TAKE 1 TABLET(40 MG) BY MOUTH DAILY  . sertraline (ZOLOFT) 100 MG tablet Take 1 tablet (100 mg total) by mouth daily.  . [DISCONTINUED] diclofenac (VOLTAREN) 75 MG EC tablet Take 1 tablet (75 mg total) by mouth 2 (two) times daily for 5 days.   No facility-administered encounter medications on file as of 04/12/2017.     Allergies (verified) Hydrocodone and Mucinex dm [dm-guaifenesin er]   History: Past Medical History:  Diagnosis Date  . Allergy    seasonal  . Anxiety   . Depression   . Hematuria   . Hot flashes   . Hx of cardiovascular stress test    Lex MV 12/13:  EF 68%, mild apical thinning, no ischemia  . Hyperlipidemia    Past Surgical History:  Procedure Laterality Date  . BREAST LUMPECTOMY     right breast, not cancer  . Carpel tunnel    . ECTOPIC PREGNANCY SURGERY     Family History  Problem Relation Age of Onset  . Heart disease Mother   . Diabetes Mother   . Esophageal cancer Father        65's  .  Colon cancer Neg Hx    Social History   Socioeconomic History  . Marital status: Legally Separated    Spouse name: None  . Number of children: 1  . Years of education: 8th grade  . Highest education level: None  Social Needs  . Financial resource strain: Not hard at all  . Food insecurity - worry: Never true  . Food insecurity - inability: Never true  . Transportation needs - medical: No  . Transportation needs - non-medical: No  Occupational History    Employer: UNEMPLOYED  Tobacco Use  . Smoking status: Former Games developer  . Smokeless tobacco: Never Used  Substance and Sexual Activity  . Alcohol use: No  . Drug use: No  . Sexual activity: No  Other Topics Concern  . None  Social History Narrative  . None    Tobacco Counseling Counseling given: Not Answered   Clinical Intake:  Pre-visit preparation completed: Yes  Pain : 0-10 Pain Score: 2  Pain Type: Chronic pain Pain Location: Arm Pain Orientation: Right Pain Descriptors / Indicators: Aching Pain Onset: More than a month ago Pain Frequency: Constant     Nutritional Risks: None Diabetes: No  How often do you need to have someone help you when you read  instructions, pamphlets, or other written materials from your doctor or pharmacy?: 1 - Never What is the last grade level you completed in school?: 8th grade  Interpreter Needed?: No  Information entered by :: Janalyn Shyalandra Clela Hagadorn, LPN   Activities of Daily Living In your present state of health, do you have any difficulty performing the following activities: 04/12/2017  Hearing? N  Vision? Y  Comment Patient has some issues with reading fine print  Difficulty concentrating or making decisions? Y  Comment Patient has issues with remembering things.   Walking or climbing stairs? N  Dressing or bathing? N  Doing errands, shopping? N  Preparing Food and eating ? N  Using the Toilet? N  In the past six months, have you accidently leaked urine? Y  Comment  Patient has urine leakage with coughing and laughing  Do you have problems with loss of bowel control? N  Managing your Medications? N  Managing your Finances? N  Housekeeping or managing your Housekeeping? N  Some recent data might be hidden     Immunizations and Health Maintenance Immunization History  Administered Date(s) Administered  . DT 03/09/2008  . Influenza-Unspecified 11/25/2011   Health Maintenance Due  Topic Date Due  . PAP SMEAR  11/29/1976    Patient Care Team: Georgina QuintSagardia, Miguel Jose, MD as PCP - General (Internal Medicine)  Indicate any recent Medical Services you may have received from other than [facility] providers in the past year (date may be approximate).     Assessment:   This is a routine wellness examination for Katie Gardner.  Hearing/Vision screen Hearing Screening Comments: Patient has not had a hearing exam.  Vision Screening Comments: Patient has not had a eye exam in a long time. Will follow up soon  Dietary issues and exercise activities discussed: Current Exercise Habits: The patient does not participate in regular exercise at present, Exercise limited by: None identified  Goals    . Exercise 3x per week (30 min per time)     Patient wants to try to start exercising on a more consistent basis.       Depression Screen PHQ 2/9 Scores 04/12/2017 04/12/2017 01/15/2017 01/01/2017 11/13/2016 07/15/2016 04/14/2016  PHQ - 2 Score 2 3 0 0 0 0 0  PHQ- 9 Score 4 11 - - - - -    Fall Risk Fall Risk  04/12/2017 04/12/2017 01/15/2017 01/01/2017 11/13/2016  Falls in the past year? Yes Yes Yes Yes No  Number falls in past yr: 2 or more 2 or more 1 1 -  Injury with Fall? No Yes Yes Yes -  Comment - - buttocks and right rib area knee and rib pain -  Risk for fall due to : Other (Comment) - - - -  Risk for fall due to: Comment tripped and fell 2 times - - - -  Follow up Falls prevention discussed - - - -    Is the patient's home free of loose throw rugs in walkways, pet beds,  electrical cords, etc?   yes      Grab bars in the bathroom? no      Handrails on the stairs?   no      Adequate lighting?   yes  Timed Get Up and Go Performed yes, completed within 30 seconds  Cognitive Function:     6CIT Screen 04/12/2017  What Year? 0 points  What month? 0 points  What time? 0 points  Count back from 20 0 points  Months  in reverse 0 points  Repeat phrase 2 points  Total Score 2    Screening Tests Health Maintenance  Topic Date Due  . PAP SMEAR  11/29/1976  . INFLUENZA VACCINE  06/06/2017 (Originally 10/07/2016)  . MAMMOGRAM  04/12/2018 (Originally 01/11/2014)  . COLONOSCOPY  04/12/2018 (Originally 11/29/2005)  . TETANUS/TDAP  04/12/2018 (Originally 11/30/1974)  . Hepatitis C Screening  04/12/2018 (Originally [birth year])  . HIV Screening  04/12/2018 (Originally 11/30/1970)    Qualifies for Shingles Vaccine? Patient declined Shingles vaccine at this time.   Cancer Screenings: Lung: Low Dose CT Chest recommended if Age 37-80 years, 30 pack-year currently smoking OR have quit w/in 15years. Patient does not qualify. Breast: Up to date on Mammogram? No, Patient declined mammogram at this time.  Up to date of Bone Density/Dexa? N/A, starts at age 54. Patient is 61 years old.  Colorectal: Patient declined colonoscopy at this time.   Additional Screenings:  Hepatitis B/HIV/Syphillis:Patient declined HIV at this time. Hep B and Syphillis not indicated  Hepatitis C Screening: Patient declined Hep C at this time.   Patient declined tetanus at this time.      Plan:   I have personally reviewed and noted the following in the patient's chart:   . Medical and social history . Use of alcohol, tobacco or illicit drugs  . Current medications and supplements . Functional ability and status . Nutritional status . Physical activity . Advanced directives . List of other physicians . Hospitalizations, surgeries, and ER visits in previous 12 months . Vitals . Screenings  to include cognitive, depression, and falls . Referrals and appointments  In addition, I have reviewed and discussed with patient certain preventive protocols, quality metrics, and best practice recommendations. A written personalized care plan for preventive services as well as general preventive health recommendations were provided to patient.     Janalyn Shy, LPN   03/12/863

## 2017-04-12 NOTE — Progress Notes (Signed)
Katie Gardner 62 y.o.   Chief Complaint  Patient presents with  . Arm Pain    RIGHT x 1 month  . Depression    score in triage 11 total    HISTORY OF PRESENT ILLNESS: This is a 62 y.o. female complaining of pain to the right forearm for about a month.  Denies trauma pain started the day after squeezing many lemons with a squeezer.  HPI   [name]   Medication Sig Start Date End Date Taking? Authorizing Provider  cyclobenzaprine (FLEXERIL) 10 MG tablet Take 10 mg by mouth 3 (three) times daily as needed for muscle spasms.   Yes [provider]  diazepam (VALIUM) 5 MG tablet Take 1 tablet (5 mg total) by mouth daily as needed for anxiety. 11/13/16  Yes Georgina Quint, MD  simvastatin (ZOCOR) 40 MG tablet TAKE 1 TABLET(40 MG) BY MOUTH DAILY 01/12/17  Yes Eunique Balik, Eilleen Kempf, MD  sertraline (ZOLOFT) 100 MG tablet Take 1 tablet (100 mg total) by mouth daily. 11/13/16 02/11/17  Georgina Quint, MD    Allergies  Allergen Reactions  . Hydrocodone Nausea And Vomiting    dizzy  . Mucinex Dm [Dm-Guaifenesin Er] Swelling    Sob, throat closing    Patient Active Problem List   Diagnosis Date Noted  . Contusion of right chest wall 01/15/2017  . Rib pain 01/15/2017  . Shortness of breath 11/13/2016  . Depression 11/13/2016  . Chronic anxiety 11/13/2016  . Chronic sore throat 11/13/2016  . Acute laryngitis 07/15/2016  . Chest pain   . Hot flashes   . Hematuria   . Allergy   . Anxiety   . Hyperlipidemia     Past Medical History:  Diagnosis Date  . Allergy    seasonal  . Anxiety   . Depression   . Hematuria   . Hot flashes   . Hx of cardiovascular stress test    Lex MV 12/13:  EF 68%, mild apical thinning, no ischemia  . Hyperlipidemia     Past Surgical History:  Procedure Laterality Date  . BREAST LUMPECTOMY     right breast, not cancer  . Carpel tunnel    . ECTOPIC PREGNANCY SURGERY      Social History   Socioeconomic  History  . Marital status: Legally Separated    Spouse name: Not on file  . Number of children: 1  . Years of education: Not on file  . Highest education level: Not on file  Social Needs  . Financial resource strain: Not on file  . Food insecurity - worry: Not on file  . Food insecurity - inability: Not on file  . Transportation needs - medical: Not on file  . Transportation needs - non-medical: Not on file  Occupational History    Employer: UNEMPLOYED  Tobacco Use  . Smoking status: Former Games developer  . Smokeless tobacco: Never Used  Substance and Sexual Activity  . Alcohol use: No  . Drug use: No  . Sexual activity: No  Other Topics Concern  . Not on file  Social History Narrative  . Not on file    Family History  Problem Relation Age of Onset  . Heart disease Mother   . Diabetes Mother   . Esophageal cancer Father        40's  . Colon cancer Neg Hx      Review of Systems  Constitutional: Negative.  Negative for chills and fever.  Respiratory: Negative  for cough and shortness of breath.   Cardiovascular: Negative for chest pain and palpitations.  Gastrointestinal: Negative for abdominal pain, nausea and vomiting.  Genitourinary: Negative.   Musculoskeletal: Positive for joint pain (right elbow).  Skin: Negative for rash.  Neurological: Negative.  Negative for dizziness, sensory change, focal weakness and headaches.  Endo/Heme/Allergies: Negative.   All other systems reviewed and are negative.   Vitals:   04/12/17 1343  BP: 140/68  Pulse: 78  Resp: 16  Temp: 98.1 F (36.7 C)  SpO2: 98%    Physical Exam  Constitutional: She is oriented to person, place, and time. She appears well-developed and well-nourished.  HENT:  Head: Normocephalic.  Eyes: EOM are normal. Pupils are equal, round, and reactive to light.  Neck: Normal range of motion. Neck supple.  Cardiovascular: Normal rate and regular rhythm.  Pulmonary/Chest: Effort normal.  Musculoskeletal:    Right upper extremity: Positive tenderness to proximal tendons of forearm.  Full range of motion.  Otherwise within normal limits Right wrist: Within normal limits Right hand: Within normal limits  Neurological: She is alert and oriented to person, place, and time. No sensory deficit. She exhibits normal muscle tone.  Skin: Skin is warm and dry. No rash noted.  Psychiatric: She has a normal mood and affect. Her behavior is normal.  Vitals reviewed.    ASSESSMENT & PLAN: Huldah was seen today for arm pain and depression.  Diagnoses and all orders for this visit:  Tendonitis of elbow, right  Right forearm pain -     diclofenac (VOLTAREN) 75 MG EC tablet; Take 1 tablet (75 mg total) by mouth 2 (two) times daily for 5 days.    Patient Instructions       IF you received an x-ray today, you will receive an invoice from [facility]. Please contact [facility] at [phone] with questions or concerns regarding your invoice.   IF you received labwork today, you will receive an invoice from [facility]. Please contact [facility] at [phone] with questions or concerns regarding your invoice.   Our billing staff will not be able to assist you with questions regarding bills from these companies.  You will be contacted with the lab results as soon as they are available. The fastest way to get your results is to activate your My Chart account. Instructions are located on the last page of this paperwork. If you have not heard from [place] regarding the results in 2 weeks, please contact this office.     Tendinitis Tendinitis is swelling (inflammation) of a tendon. A tendon is cord of tissue that connects muscle to bone. Tendinitis can cause pain, tenderness, and swelling. It is usually treated with RICE therapy. RICE stands for:  Rest.  Ice.  Compression. This means putting pressure on the affected area.  Elevation. This means raising the affected area above the level  of your heart.  Follow these instructions at home: If you have a splint or brace:  Wear the splint or brace as told by your doctor. Remove it only as told by your doctor.  Loosen the splint or brace if your fingers or toes tingle, become numb, or turn cold and blue.  Do not take baths, swim, or use a hot tub until your doctor approves. Ask your doctor if you can take showers. You may only be able to take sponge baths for bathing.  Do not let your splint or brace get wet if it is not waterproof. ? If your splint or  brace is not waterproof, cover it with a watertight plastic bag when you take a bath or a shower.  Keep the splint or brace clean. Managing pain, stiffness, and swelling  If directed, apply ice to the affected area. ? Put ice in a plastic bag. ? Place a towel between your skin and the bag. ? Leave the ice on for 20 minutes, 2-3 times a day.  If directed, apply heat to the affected area as often as told by your doctor. Use the heat source that your doctor recommends. ? Place a towel between your skin and the heat source. ? Leave the heat on for 20-30 minutes. ? Take off the heat if your skin turns bright red. This is especially important if you are unable to feel pain, heat, or cold. You may have a greater risk of getting burned.  Move the fingers or toes of the affected arm or leg often, if this applies. This helps to prevent stiffness and to lessen swelling.  If directed, raise the affected area above the level of your heart while you are sitting or lying down. Driving  Do not drive or use heavy machinery while taking prescription pain medicine.  Ask your doctor when it is safe to drive if you have a splint or brace on any part of your arm or leg. Activity  Return to your normal activities as told by your doctor. Ask your doctor what activities are safe for you.  Rest the affected area as told by your doctor.  Avoid using the affected area while you have  tendinitis.  Do exercises (physical therapy) as told by your doctor. General instructions  If you have a splint, do not put pressure on any part of the splint until it is fully hardened. This may take several hours.  Wear an elastic bandage or pressure (compression) wrap only as told by your doctor.  Take over-the-counter and prescription medicines only as told by your doctor.  Keep all follow-up visits as told by your doctor. This is important. Contact a doctor if:  You do not get better.  You get new problems, such as numbness in your hands, and you do not know why. This information is not intended to replace advice given to you by your health care provider. Make sure you discuss any questions you have with your health care provider. Document Released: 06/05/2010 Document Revised: 10/24/2015 Document Reviewed: 11/26/2014 Elsevier Interactive Patient Education  2018 [employer]      Edwina BarthMiguel Larene Ascencio, MD [facility]

## 2017-04-12 NOTE — Patient Instructions (Addendum)
Katie Gardner , Thank you for taking time to come for your Medicare Wellness Visit. I appreciate your ongoing commitment to your health goals. Please review the following plan we discussed and let me know if I can assist you in the future.   Screening recommendations/referrals: Colonoscopy: declined Mammogram: declined Bone Density: starts at age 62 Recommended yearly ophthalmology/optometry visit for glaucoma screening and checkup Recommended yearly dental visit for hygiene and checkup  Vaccinations: Influenza vaccine: declined  Pneumococcal vaccine: age 62 Tdap vaccine: declined due to insurance Shingles vaccine: declined    Advanced directives: Advance directive discussed with you today. I have provided a copy for you to complete at home and have notarized. Once this is complete please bring a copy in to our office so we can scan it into your chart.  Conditions/risks identified: Try to start exercising on a more consistent basis.   Next appointment: schedule follow up visit with PCP, 1 year for AWV   Preventive Care 40-64 Years, Female Preventive care refers to lifestyle choices and visits with your health care provider that can promote health and wellness. What does preventive care include?  A yearly physical exam. This is also called an annual well check.  Dental exams once or twice a year.  Routine eye exams. Ask your health care provider how often you should have your eyes checked.  Personal lifestyle choices, including:  Daily care of your teeth and gums.  Regular physical activity.  Eating a healthy diet.  Avoiding tobacco and drug use.  Limiting alcohol use.  Practicing safe sex.  Taking low-dose aspirin daily starting at age 62.  Taking vitamin and mineral supplements as recommended by your health care provider. What happens during an annual well check? The services and screenings done by your health care provider during your annual well check will depend on  your age, overall health, lifestyle risk factors, and family history of disease. Counseling  Your health care provider may ask you questions about your:  Alcohol use.  Tobacco use.  Drug use.  Emotional well-being.  Home and relationship well-being.  Sexual activity.  Eating habits.  Work and work Statistician.  Method of birth control.  Menstrual cycle.  Pregnancy history. Screening  You may have the following tests or measurements:  Height, weight, and BMI.  Blood pressure.  Lipid and cholesterol levels. These may be checked every 5 years, or more frequently if you are over 62 years old.  Skin check.  Lung cancer screening. You may have this screening every year starting at age 49 if you have a 30-pack-year history of smoking and currently smoke or have quit within the past 15 years.  Fecal occult blood test (FOBT) of the stool. You may have this test every year starting at age 41.  Flexible sigmoidoscopy or colonoscopy. You may have a sigmoidoscopy every 5 years or a colonoscopy every 10 years starting at age 16.  Hepatitis C blood test.  Hepatitis B blood test.  Sexually transmitted disease (STD) testing.  Diabetes screening. This is done by checking your blood sugar (glucose) after you have not eaten for a while (fasting). You may have this done every 1-3 years.  Mammogram. This may be done every 1-2 years. Talk to your health care provider about when you should start having regular mammograms. This may depend on whether you have a family history of breast cancer.  BRCA-related cancer screening. This may be done if you have a family history of breast, ovarian, tubal, or  peritoneal cancers.  Pelvic exam and Pap test. This may be done every 3 years starting at age 11. Starting at age 47, this may be done every 5 years if you have a Pap test in combination with an HPV test.  Bone density scan. This is done to screen for osteoporosis. You may have this scan if  you are at high risk for osteoporosis. Discuss your test results, treatment options, and if necessary, the need for more tests with your health care provider. Vaccines  Your health care provider may recommend certain vaccines, such as:  Influenza vaccine. This is recommended every year.  Tetanus, diphtheria, and acellular pertussis (Tdap, Td) vaccine. You may need a Td booster every 10 years.  Zoster vaccine. You may need this after age 40.  Pneumococcal 13-valent conjugate (PCV13) vaccine. You may need this if you have certain conditions and were not previously vaccinated.  Pneumococcal polysaccharide (PPSV23) vaccine. You may need one or two doses if you smoke cigarettes or if you have certain conditions. Talk to your health care provider about which screenings and vaccines you need and how often you need them. This information is not intended to replace advice given to you by your health care provider. Make sure you discuss any questions you have with your health care provider. Document Released: 03/22/2015 Document Revised: 11/13/2015 Document Reviewed: 12/25/2014 Elsevier Interactive Patient Education  2017 [place] Prevention in the Home Falls can cause injuries. They can happen to people of all ages. There are many things you can do to make your home safe and to help prevent falls. What can I do on the outside of my home?  Regularly fix the edges of walkways and driveways and fix any cracks.  Remove anything that might make you trip as you walk through a door, such as a raised step or threshold.  Trim any bushes or trees on the path to your home.  Use bright outdoor lighting.  Clear any walking paths of anything that might make someone trip, such as rocks or tools.  Regularly check to see if handrails are loose or broken. Make sure that both sides of any steps have handrails.  Any raised decks and porches should have guardrails on the edges.  Have any  leaves, snow, or ice cleared regularly.  Use sand or salt on walking paths during winter.  Clean up any spills in your garage right away. This includes oil or grease spills. What can I do in the bathroom?  Use night lights.  Install grab bars by the toilet and in the tub and shower. Do not use towel bars as grab bars.  Use non-skid mats or decals in the tub or shower.  If you need to sit down in the shower, use a plastic, non-slip stool.  Keep the floor dry. Clean up any water that spills on the floor as soon as it happens.  Remove soap buildup in the tub or shower regularly.  Attach bath mats securely with double-sided non-slip rug tape.  Do not have throw rugs and other things on the floor that can make you trip. What can I do in the bedroom?  Use night lights.  Make sure that you have a light by your bed that is easy to reach.  Do not use any sheets or blankets that are too big for your bed. They should not hang down onto the floor.  Have a firm chair that has side arms. You can  use this for support while you get dressed.  Do not have throw rugs and other things on the floor that can make you trip. What can I do in the kitchen?  Clean up any spills right away.  Avoid walking on wet floors.  Keep items that you use a lot in easy-to-reach places.  If you need to reach something above you, use a strong step stool that has a grab bar.  Keep electrical cords out of the way.  Do not use floor polish or wax that makes floors slippery. If you must use wax, use non-skid floor wax.  Do not have throw rugs and other things on the floor that can make you trip. What can I do with my stairs?  Do not leave any items on the stairs.  Make sure that there are handrails on both sides of the stairs and use them. Fix handrails that are broken or loose. Make sure that handrails are as long as the stairways.  Check any carpeting to make sure that it is firmly attached to the stairs.  Fix any carpet that is loose or worn.  Avoid having throw rugs at the top or bottom of the stairs. If you do have throw rugs, attach them to the floor with carpet tape.  Make sure that you have a light switch at the top of the stairs and the bottom of the stairs. If you do not have them, ask someone to add them for you. What else can I do to help prevent falls?  Wear shoes that:  Do not have high heels.  Have rubber bottoms.  Are comfortable and fit you well.  Are closed at the toe. Do not wear sandals.  If you use a stepladder:  Make sure that it is fully opened. Do not climb a closed stepladder.  Make sure that both sides of the stepladder are locked into place.  Ask someone to hold it for you, if possible.  Clearly mark and make sure that you can see:  Any grab bars or handrails.  First and last steps.  Where the edge of each step is.  Use tools that help you move around (mobility aids) if they are needed. These include:  Canes.  Walkers.  Scooters.  Crutches.  Turn on the lights when you go into a dark area. Replace any light bulbs as soon as they burn out.  Set up your furniture so you have a clear path. Avoid moving your furniture around.  If any of your floors are uneven, fix them.  If there are any pets around you, be aware of where they are.  Review your medicines with your doctor. Some medicines can make you feel dizzy. This can increase your chance of falling. Ask your doctor what other things that you can do to help prevent falls. This information is not intended to replace advice given to you by your health care provider. Make sure you discuss any questions you have with your health care provider. Document Released: 12/20/2008 Document Revised: 08/01/2015 Document Reviewed: 03/30/2014 Elsevier Interactive Patient Education  2017 [employer].

## 2017-04-12 NOTE — Patient Instructions (Addendum)
   IF you received an x-ray today, you will receive an invoice from [facility]. Please contact [facility] at [phone] with questions or concerns regarding your invoice.   IF you received labwork today, you will receive an invoice from [facility]. Please contact [facility] at [phone] with questions or concerns regarding your invoice.   Our billing staff will not be able to assist you with questions regarding bills from these companies.  You will be contacted with the lab results as soon as they are available. The fastest way to get your results is to activate your My Chart account. Instructions are located on the last page of this paperwork. If you have not heard from us regarding the results in 2 weeks, please contact this office.     Tendinitis Tendinitis is swelling (inflammation) of a tendon. A tendon is cord of tissue that connects muscle to bone. Tendinitis can cause pain, tenderness, and swelling. It is usually treated with RICE therapy. RICE stands for:  Rest.  Ice.  Compression. This means putting pressure on the affected area.  Elevation. This means raising the affected area above the level of your heart.  Follow these instructions at home: If you have a splint or brace:  Wear the splint or brace as told by your doctor. Remove it only as told by your doctor.  Loosen the splint or brace if your fingers or toes tingle, become numb, or turn cold and blue.  Do not take baths, swim, or use a hot tub until your doctor approves. Ask your doctor if you can take showers. You may only be able to take sponge baths for bathing.  Do not let your splint or brace get wet if it is not waterproof. ? If your splint or brace is not waterproof, cover it with a watertight plastic bag when you take a bath or a shower.  Keep the splint or brace clean. Managing pain, stiffness, and swelling  If directed, apply ice to the affected area. ? Put ice in a plastic  bag. ? Place a towel between your skin and the bag. ? Leave the ice on for 20 minutes, 2-3 times a day.  If directed, apply heat to the affected area as often as told by your doctor. Use the heat source that your doctor recommends. ? Place a towel between your skin and the heat source. ? Leave the heat on for 20-30 minutes. ? Take off the heat if your skin turns bright red. This is especially important if you are unable to feel pain, heat, or cold. You may have a greater risk of getting burned.  Move the fingers or toes of the affected arm or leg often, if this applies. This helps to prevent stiffness and to lessen swelling.  If directed, raise the affected area above the level of your heart while you are sitting or lying down. Driving  Do not drive or use heavy machinery while taking prescription pain medicine.  Ask your doctor when it is safe to drive if you have a splint or brace on any part of your arm or leg. Activity  Return to your normal activities as told by your doctor. Ask your doctor what activities are safe for you.  Rest the affected area as told by your doctor.  Avoid using the affected area while you have tendinitis.  Do exercises (physical therapy) as told by your doctor. General instructions  If you have a splint, do not put pressure on any   part of the splint until it is fully hardened. This may take several hours.  Wear an elastic bandage or pressure (compression) wrap only as told by your doctor.  Take over-the-counter and prescription medicines only as told by your doctor.  Keep all follow-up visits as told by your doctor. This is important. Contact a doctor if:  You do not get better.  You get new problems, such as numbness in your hands, and you do not know why. This information is not intended to replace advice given to you by your health care provider. Make sure you discuss any questions you have with your health care provider. Document Released:  06/05/2010 Document Revised: 10/24/2015 Document Reviewed: 11/26/2014 Elsevier Interactive Patient Education  2018 [employer]vier Inc.  

## 2017-04-13 ENCOUNTER — Other Ambulatory Visit: Payer: Self-pay | Admitting: Emergency Medicine

## 2017-07-12 ENCOUNTER — Other Ambulatory Visit: Payer: Self-pay | Admitting: Emergency Medicine

## 2017-07-12 DIAGNOSIS — F419 Anxiety disorder, unspecified: Secondary | ICD-10-CM

## 2017-09-15 ENCOUNTER — Other Ambulatory Visit: Payer: Self-pay | Admitting: Emergency Medicine

## 2017-09-16 NOTE — Telephone Encounter (Signed)
Zocor 40 MG tab refill Last Refill: 04/13/17 #90 0 refills Last OV: 04/12/17 PCP: Dr. Alvy [facility]imlerSagardia [facility] [address]

## 2017-10-01 NOTE — Progress Notes (Signed)
MRN: [id number] DOB: 30-May-1955  Subjective:   Katie Gardner is a 62 y.o. female presenting for chief complaint of Sore Throat .  Reports 1 week history of scrathy throat, PND, itchy watery eyes, sneezing, and dry cough at night. Happens with changes in seasons. Has tried benedryl and OTC eye drops with some relief. Denies fever, sinus pain, ear pain, wheezing, shortness of breath, chest pain and myalgia, nausea, vomiting, abdominal pain and diarrhea. Has not had sick contact with anyone. Has  history of seasonal allergies for the past year. No history of asthma. Denies smoking.  In terms ofblood pressure, has a past medical history of hypertension.  Used to be on lisinopril 2.5 mg.  Was taken off it a while ago because her blood pressures were well controlled with diet.  Denies chest pain, shortness of breath, chronic headache, diaphoresis, heart patient's, lower leg swelling, hematuria, nausea, vomiting, visual problems.  [name] has a current medication list which includes the following prescription(s): cyclobenzaprine, diazepam, simvastatin, cetirizine, fluticasone, lisinopril, olopatadine hcl, and sertraline. Also is allergic to hydrocodone and mucinex dm [dm-guaifenesin er].  [name]  has a past medical history of Allergy, Anxiety, Depression, Hematuria, Hot flashes, cardiovascular stress test, and Hyperlipidemia. Also  has a past surgical history that includes Ectopic pregnancy surgery; Breast lumpectomy; and Carpel tunnel.   Objective:   Vitals: BP (!) 142/74   Pulse 85   Temp 97.6 F (36.4 C) (Oral)   Ht 5' 0.5" (1.537 m)   Wt 141 lb 6.4 oz (64.1 kg)   SpO2 97%   BMI 27.16 kg/m   Physical Exam  Constitutional: She is oriented to person, place, and time. She appears well-developed and well-nourished. No distress.  HENT:  Head: Normocephalic and atraumatic.  Right Ear: External ear and ear canal normal. A middle ear effusion is present.  Left Ear: External ear and ear  canal normal. A middle ear effusion is present.  Nose: Mucosal edema (swollen boggy turbinates b/l) present. Right sinus exhibits no maxillary sinus tenderness and no frontal sinus tenderness. Left sinus exhibits no maxillary sinus tenderness and no frontal sinus tenderness.  Mouth/Throat: Uvula is midline and mucous membranes are normal. Posterior oropharyngeal erythema (oropharynx cobblestoning noted) present. No posterior oropharyngeal edema or tonsillar abscesses. No tonsillar exudate.  Eyes: Pupils are equal, round, and reactive to light. EOM are normal. Right conjunctiva is injected (mild). Left conjunctiva is injected (mild).  Neck: Normal range of motion.  Cardiovascular: Normal rate, regular rhythm, normal heart sounds and intact distal pulses.  Pulmonary/Chest: Effort normal and breath sounds normal. She has no decreased breath sounds. She has no wheezes. She has no rhonchi. She has no rales.  Musculoskeletal:       Right lower leg: She exhibits no swelling.       Left lower leg: She exhibits no swelling.  Lymphadenopathy:       Head (right side): No submental, no submandibular, no tonsillar, no preauricular, no posterior auricular and no occipital adenopathy present.       Head (left side): No submental, no submandibular, no tonsillar, no preauricular, no posterior auricular and no occipital adenopathy present.    She has no cervical adenopathy.       Right: No supraclavicular adenopathy present.       Left: No supraclavicular adenopathy present.  Neurological: She is alert and oriented to person, place, and time.  Skin: Skin is warm and dry.  Psychiatric: She has a normal mood and affect.  Vitals reviewed.    BP Readings from Last 3 Encounters:  10/02/17 (!) 142/74  04/12/17 122/78  04/12/17 140/68    No results found for this or any previous visit (from the past 24 hour(s)).  Assessment and Plan :  1. Seasonal allergies History and physical exam consistent with seasonal  allergies.  Recommended daily Zyrtec, Flonase, and eyedrops as needed.  Advised to return to clinic if symptoms worsen, do not improve, or as needed. - cetirizine (ZYRTEC) 10 MG tablet; Take 1 tablet (10 mg total) by mouth daily.  Dispense: 90 tablet; Refill: 1 - fluticasone (FLONASE) 50 MCG/ACT nasal spray; Place 2 sprays into both nostrils daily.  Dispense: 16 g; Refill: 6 - Olopatadine HCl (PATADAY) 0.2 % SOLN; Apply 1 drop to eye daily.  Dispense: 2.5 mL; Refill: 0  2. Essential hypertension Both BP readings were elevated in office today. She is asymptomatic.  Do recommend restarting lisinopril 2.5 mg as this is a low dose.  Instructed to check bp outside of office over the next couple of weeks. Return if consistently >140/90.  Otherwise, follow-up with PCP in 1 month for reevaluation.  Given strict ED precautions.  - lisinopril (PRINIVIL,ZESTRIL) 2.5 MG tablet; Take 1 tablet (2.5 mg total) by mouth daily.  Dispense: 30 tablet; Refill: 0   Benjiman Core, PA-C  Primary Care at [facility] 10/02/2017 12:51 PM

## 2017-10-02 ENCOUNTER — Encounter: Payer: Self-pay | Admitting: Physician Assistant

## 2017-10-02 ENCOUNTER — Other Ambulatory Visit: Payer: Self-pay | Admitting: Physician Assistant

## 2017-10-02 ENCOUNTER — Other Ambulatory Visit: Payer: Self-pay

## 2017-10-02 ENCOUNTER — Ambulatory Visit (INDEPENDENT_AMBULATORY_CARE_PROVIDER_SITE_OTHER): Payer: Medicare (Managed Care) | Admitting: Physician Assistant

## 2017-10-02 VITALS — BP 142/74 | HR 85 | Temp 97.6°F | Ht 60.5 in | Wt 141.4 lb

## 2017-10-02 DIAGNOSIS — I10 Essential (primary) hypertension: Secondary | ICD-10-CM | POA: Diagnosis not present

## 2017-10-02 DIAGNOSIS — J302 Other seasonal allergic rhinitis: Secondary | ICD-10-CM | POA: Diagnosis not present

## 2017-10-02 MED ORDER — OLOPATADINE HCL 0.2 % OP SOLN: 1.0000 [drp] | Freq: Every day | OPHTHALMIC | 0 refills | Status: DC

## 2017-10-02 MED ORDER — CETIRIZINE HCL 10 MG PO TABS
10.0000 mg | ORAL_TABLET | Freq: Every day | ORAL | 1 refills | Status: DC
Start: 2017-10-02 — End: 2017-12-17

## 2017-10-02 MED ORDER — FLUTICASONE PROPIONATE 50 MCG/ACT NA SUSP: 2.0000 | Freq: Every day | NASAL | 6 refills | Status: DC

## 2017-10-02 MED ORDER — LISINOPRIL 2.5 MG PO TABS: 2.5000 mg | ORAL_TABLET | Freq: Every day | ORAL | 0 refills | Status: DC

## 2017-10-02 NOTE — Patient Instructions (Addendum)
For allergies, start daily zyrtec, flonase, and eye drops. When you are dusting at home make sure to wear a mask. Return to clinic if symptoms worsen, do not improve, or as needed  In terms of elevated blood pressure, restart lisinopril 2.5mg  daily. I would like you to check your blood pressure at least a couple times over the next week outside of the office and document these values. It is best if you check the blood pressure at different times in the day. Your goal is <140/90. If your values are consistently above this goal, please return to office for further evaluation. Otherwise, follow up with Dr. Alvy Bimler in 4 weeks.  If you start to have chest pain, blurred vision, shortness of breath, severe headache, lower leg swelling, or nausea/vomiting please seek care immediately here or at the ED.     Allergic Rhinitis, Adult Allergic rhinitis is an allergic reaction that affects the mucous membrane inside the nose. It causes sneezing, a runny or stuffy nose, and the feeling of mucus going down the back of the throat (postnasal drip). Allergic rhinitis can be mild to severe. There are two types of allergic rhinitis:  Seasonal. This type is also called hay fever. It happens only during certain seasons.  Perennial. This type can happen at any time of the year.  What are the causes? This condition happens when the body's defense system (immune system) responds to certain harmless substances called allergens as though they were germs.  Seasonal allergic rhinitis is triggered by pollen, which can come from grasses, trees, and weeds. Perennial allergic rhinitis may be caused by:  House dust mites.  Pet dander.  Mold spores.  What are the signs or symptoms? Symptoms of this condition include:  Sneezing.  Runny or stuffy nose (nasal congestion).  Postnasal drip.  Itchy nose.  Tearing of the eyes.  Trouble sleeping.  Daytime sleepiness.  How is this diagnosed? This condition may be  diagnosed based on:  Your medical history.  A physical exam.  Tests to check for related conditions, such as: ? Asthma. ? Pink eye. ? Ear infection. ? Upper respiratory infection.  Tests to find out which allergens trigger your symptoms. These may include skin or blood tests.  How is this treated? There is no cure for this condition, but treatment can help control symptoms. Treatment may include:  Taking medicines that block allergy symptoms, such as antihistamines. Medicine may be given as a shot, nasal spray, or pill.  Avoiding the allergen.  Desensitization. This treatment involves getting ongoing shots until your body becomes less sensitive to the allergen. This treatment may be done if other treatments do not help.  If taking medicine and avoiding the allergen does not work, new, stronger medicines may be prescribed.  Follow these instructions at home:  Find out what you are allergic to. Common allergens include smoke, dust, and pollen.  Avoid the things you are allergic to. These are some things you can do to help avoid allergens: ? Replace carpet with wood, tile, or vinyl flooring. Carpet can trap dander and dust. ? Do not smoke. Do not allow smoking in your home. ? Change your heating and air conditioning filter at least once a month. ? During allergy season:  Keep windows closed as much as possible.  Plan outdoor activities when pollen counts are lowest. This is usually during the evening hours.  When coming indoors, change clothing and shower before sitting on furniture or bedding.  Take over-the-counter and prescription  medicines only as told by your health care provider.  Keep all follow-up visits as told by your health care provider. This is important. Contact a health care provider if:  You have a fever.  You develop a persistent cough.  You make whistling sounds when you breathe (you wheeze).  Your symptoms interfere with your normal daily  activities. Get help right away if:  You have shortness of breath. Summary  This condition can be managed by taking medicines as directed and avoiding allergens.  Contact your health care provider if you develop a persistent cough or fever.  During allergy season, keep windows closed as much as possible. This information is not intended to replace advice given to you by your health care provider. Make sure you discuss any questions you have with your health care provider. Document Released: 11/18/2000 Document Revised: 04/02/2016 Document Reviewed: 04/02/2016 Elsevier Interactive Patient Education  2018 [employer]    IF you received an x-ray today, you will receive an invoice from [facility]. Please contact [facility] at [phone] with questions or concerns regarding your invoice.   IF you received labwork today, you will receive an invoice from [facility]. Please contact [facility] at [phone] with questions or concerns regarding your invoice.   Our billing staff will not be able to assist you with questions regarding bills from these companies.  You will be contacted with the lab results as soon as they are available. The fastest way to get your results is to activate your My Chart account. Instructions are located on the last page of this paperwork. If you have not heard from us regarding the results in 2 weeks, please contact this office.

## 2017-10-04 NOTE — Telephone Encounter (Signed)
TC to patient regarding lisinopril 2.5 MG tab, 90 day supply request from her pharmacy.  No answer. Left VM she would need to call to schedule a b/p follow up appointment as indicated in LOV note on 10/02/17.

## 2017-10-08 ENCOUNTER — Other Ambulatory Visit: Payer: Self-pay

## 2017-10-08 ENCOUNTER — Ambulatory Visit (INDEPENDENT_AMBULATORY_CARE_PROVIDER_SITE_OTHER): Payer: 59 | Admitting: Family Medicine

## 2017-10-08 ENCOUNTER — Encounter: Payer: Self-pay | Admitting: Family Medicine

## 2017-10-08 VITALS — BP 157/82 | HR 64 | Temp 98.5°F | Ht 65.0 in | Wt 145.8 lb

## 2017-10-08 DIAGNOSIS — F4323 Adjustment disorder with mixed anxiety and depressed mood: Secondary | ICD-10-CM

## 2017-10-08 DIAGNOSIS — J302 Other seasonal allergic rhinitis: Secondary | ICD-10-CM

## 2017-10-08 DIAGNOSIS — I10 Essential (primary) hypertension: Secondary | ICD-10-CM

## 2017-10-08 NOTE — Patient Instructions (Signed)
     IF you received an x-ray today, you will receive an invoice from [facility]. Please contact [facility] at [phone] with questions or concerns regarding your invoice.   IF you received labwork today, you will receive an invoice from [facility]. Please contact [facility] at [phone] with questions or concerns regarding your invoice.   Our billing staff will not be able to assist you with questions regarding bills from these companies.  You will be contacted with the lab results as soon as they are available. The fastest way to get your results is to activate your My Chart account. Instructions are located on the last page of this paperwork. If you have not heard from us regarding the results in 2 weeks, please contact this office.     

## 2017-10-08 NOTE — Progress Notes (Signed)
8/2/201911:00 AM  Katie Gardner 1955/09/02, 62 y.o. female [id number]  Chief Complaint  Patient presents with  . Cough    has concerns about her bp    HPI:   Patient is a 62 y.o. female with past medical history significant for seasonal allergies and HTN who presents today for continued cough   She was seen about a week ago Started on zyrtec, flonase and lisinopril She has not been taking meds as prescribed She has been stressed of recent, her granddaughter does not let her see her children anymore Denies SI  She denies any fever, chills, SOB States cough is not productive Denies h/o asthma She is a remote smoker  Fall Risk  10/08/2017 10/02/2017 04/12/2017 04/12/2017 01/15/2017  Falls in the past year? No No Yes Yes Yes  Number falls in past yr: - - 2 or more 2 or more 1  Injury with Fall? - - No Yes Yes  Comment - - - - buttocks and right rib area  Risk for fall due to : - - Other (Comment) - -  Risk for fall due to: Comment - - tripped and fell 2 times - -  Follow up - - Falls prevention discussed - -     Depression screen [facility] 2/9 10/08/2017 10/02/2017 04/12/2017  Decreased Interest 0 0 1  Down, Depressed, Hopeless 0 0 1  PHQ - 2 Score 0 0 2  Altered sleeping - - 1  Tired, decreased energy - - 0  Change in appetite - - 0  Feeling bad or failure about yourself  - - 1  Trouble concentrating - - 0  Moving slowly or fidgety/restless - - 0  Suicidal thoughts - - 0  PHQ-9 Score - - 4  Difficult doing work/chores - - Not difficult at all    Allergies  Allergen Reactions  . Hydrocodone Nausea And Vomiting    dizzy  . Mucinex Dm [Dm-Guaifenesin Er] Swelling    Sob, throat closing    [name]   Medication Sig Start Date End Date Taking? Authorizing Provider  cetirizine (ZYRTEC) 10 MG tablet Take 1 tablet (10 mg total) by mouth daily. 10/02/17  Yes Barnett Abu, [place] D, PA-C  cyclobenzaprine (FLEXERIL) 10 MG tablet Take 10 mg by mouth 3 (three) times  daily as needed for muscle spasms.   Yes [provider]  diazepam (VALIUM) 5 MG tablet TAKE 1 TABLET BY MOUTH EVERY DAY AS NEEDED FOR ANXIETY 07/12/17  Yes Sagardia, Eilleen Kempf, MD  fluticasone [facility]) 50 MCG/ACT nasal spray Place 2 sprays into both nostrils daily. 10/02/17  Yes Barnett Abu, [place] D, PA-C  lisinopril (PRINIVIL,ZESTRIL) 2.5 MG tablet Take 1 tablet (2.5 mg total) by mouth daily. 10/02/17  Yes Barnett Abu, [place] D, PA-C  Olopatadine HCl (PATADAY) 0.2 % SOLN Apply 1 drop to eye daily. 10/02/17  Yes Barnett Abu, [place] D, PA-C  simvastatin (ZOCOR) 40 MG tablet TAKE 1 TABLET(40 MG) BY MOUTH DAILY 09/16/17  Yes Sagardia, Eilleen Kempf, MD  sertraline (ZOLOFT) 100 MG tablet Take 1 tablet (100 mg total) by mouth daily. 11/13/16 02/11/17  Georgina Quint, MD    Past Medical History:  Diagnosis Date  . Allergy    seasonal  . Anxiety   . Depression   . Hematuria   . Hot flashes   . Hx of cardiovascular stress test    Lex MV 12/13:  EF 68%, mild apical thinning, no ischemia  . Hyperlipidemia     Past Surgical  History:  Procedure Laterality Date  . BREAST LUMPECTOMY     right breast, not cancer  . Carpel tunnel    . ECTOPIC PREGNANCY SURGERY      Social History   Tobacco Use  . Smoking status: Former Games developermoker  . Smokeless tobacco: Never Used  Substance Use Topics  . Alcohol use: No    Family History  Problem Relation Age of Onset  . Heart disease Mother   . Diabetes Mother   . Esophageal cancer Father        1570's  . Colon cancer Neg Hx     Review of Systems  HENT: Positive for congestion. Negative for ear pain, sinus pain and sore throat.   Cardiovascular: Negative for chest pain and palpitations.   Per hpi  OBJECTIVE:  Blood pressure (!) 157/82, pulse 64, temperature 98.5 F (36.9 C), temperature source Oral, height 5\' 5"  (1.651 m), weight 145 lb 12.8 oz (66.1 kg), SpO2 99 %. Body mass index is 24.26 kg/m.   BP Readings from Last 3 Encounters:    10/08/17 (!) 157/82  10/02/17 (!) 142/74  04/12/17 122/78    Physical Exam  Constitutional: She is oriented to person, place, and time. She appears well-developed and well-nourished.  HENT:  Head: Normocephalic and atraumatic.  Right Ear: Hearing, tympanic membrane, external ear and ear canal normal.  Left Ear: Hearing, tympanic membrane, external ear and ear canal normal.  Mouth/Throat: Oropharynx is clear and moist.  Eyes: Pupils are equal, round, and reactive to light. EOM are normal.  Neck: Neck supple.  Cardiovascular: Normal rate, regular rhythm and normal heart sounds. Exam reveals no gallop and no friction rub.  No murmur heard. Pulmonary/Chest: Effort normal and breath sounds normal. She has no wheezes. She has no rales.  Lymphadenopathy:    She has no cervical adenopathy.  Neurological: She is alert and oriented to person, place, and time.  Skin: Skin is warm and dry.  Psychiatric:  tearful  Nursing note and vitals reviewed.    ASSESSMENT and PLAN  1. Seasonal allergies 2. Essential hypertension 3. Adjustment disorder with mixed anxiety and depressed mood Discussed importance of medication compliance. Discussed current social situation. Patient declines any further treatment for mood ar this time. RTC precautions given.  Return in about 1 month (around 11/05/2017) for HTN with Dr Alvy BimlerSagardia, PCP.    Myles LippsIrma M Santiago, MD Primary Care at [facility] [address] [place], [place] [address] Ph.  [phone] Fax [phone]

## 2017-11-10 ENCOUNTER — Other Ambulatory Visit: Payer: Self-pay | Admitting: Emergency Medicine

## 2017-12-07 ENCOUNTER — Other Ambulatory Visit: Payer: Self-pay | Admitting: Emergency Medicine

## 2017-12-09 ENCOUNTER — Other Ambulatory Visit: Payer: Self-pay | Admitting: Emergency Medicine

## 2017-12-09 NOTE — Telephone Encounter (Signed)
Requested medication (s) are due for refill today: yes  Requested medication (s) are on the active medication list: yes    Last refill: 11/10/17  #30  0 refills  Future visit scheduled no  Notes to clinic:  Requested Prescriptions  Pending Prescriptions Disp Refills   sertraline (ZOLOFT) 100 MG tablet [Pharmacy Med Name: SERTRALINE 100MG  TABLETS] 30 tablet 0    Sig: TAKE 1 TABLET BY MOUTH DAILY     Psychiatry:  Antidepressants - SSRI Failed - 12/09/2017  4:45 PM      Failed - Valid encounter within last 6 months    Recent Outpatient Visits          2 months ago Seasonal allergies   [facility], Meda Coffee, MD   2 months ago Seasonal allergies   [facility] at [place], [place] D, PA-C   8 months ago Tendonitis of elbow, right   Primary Care at [place], Eilleen Kempf, MD   10 months ago Rib pain   Primary Care at [facility], [place], MD   11 months ago Chest pain, pleuritic   Primary Care at [place], Marolyn Hammock, PA-C             Passed - Completed PHQ-2 or PHQ-9 in the last 360 days.

## 2017-12-14 NOTE — Telephone Encounter (Signed)
Patient is requesting a refill of the following medications: Requested Prescriptions   Pending Prescriptions Disp Refills  . sertraline (ZOLOFT) 100 MG tablet [Pharmacy Med Name: SERTRALINE 100MG  TABLETS] 30 tablet 0    Sig: TAKE 1 TABLET BY MOUTH DAILY    Date of patient request: 12/09/17 Last office visit:  04/12/2017 Date of last refill: 11/10/17 Last refill amount:30 Follow up time period per chart: no further refills without appt. Will denied request but will send to schedulers to call pt to schedule f/u med refills. Dgaddy, CMA

## 2017-12-17 ENCOUNTER — Encounter: Payer: Self-pay | Admitting: Emergency Medicine

## 2017-12-17 ENCOUNTER — Ambulatory Visit (INDEPENDENT_AMBULATORY_CARE_PROVIDER_SITE_OTHER): Payer: 59 | Admitting: Emergency Medicine

## 2017-12-17 ENCOUNTER — Other Ambulatory Visit: Payer: Self-pay

## 2017-12-17 VITALS — BP 128/72 | HR 62 | Temp 98.0°F | Resp 16 | Ht 60.24 in | Wt 145.0 lb

## 2017-12-17 DIAGNOSIS — J302 Other seasonal allergic rhinitis: Secondary | ICD-10-CM | POA: Diagnosis not present

## 2017-12-17 DIAGNOSIS — F419 Anxiety disorder, unspecified: Secondary | ICD-10-CM

## 2017-12-17 DIAGNOSIS — F32A Depression, unspecified: Secondary | ICD-10-CM

## 2017-12-17 DIAGNOSIS — G8929 Other chronic pain: Secondary | ICD-10-CM | POA: Insufficient documentation

## 2017-12-17 DIAGNOSIS — F329 Major depressive disorder, single episode, unspecified: Secondary | ICD-10-CM | POA: Diagnosis not present

## 2017-12-17 DIAGNOSIS — M542 Cervicalgia: Secondary | ICD-10-CM

## 2017-12-17 DIAGNOSIS — I10 Essential (primary) hypertension: Secondary | ICD-10-CM | POA: Diagnosis not present

## 2017-12-17 MED ORDER — CYCLOBENZAPRINE HCL 5 MG PO TABS: 5.0000 mg | ORAL_TABLET | Freq: Three times a day (TID) | ORAL | 1 refills | Status: DC | PRN

## 2017-12-17 MED ORDER — CETIRIZINE HCL 10 MG PO TABS: 10.0000 mg | ORAL_TABLET | Freq: Every day | ORAL | 1 refills | Status: DC

## 2017-12-17 MED ORDER — SERTRALINE HCL 100 MG PO TABS: ORAL_TABLET | ORAL | 3 refills | Status: DC

## 2017-12-17 MED ORDER — LISINOPRIL 10 MG PO TABS: 10.0000 mg | ORAL_TABLET | Freq: Every day | ORAL | 3 refills | Status: DC

## 2017-12-17 MED ORDER — DIAZEPAM 5 MG PO TABS: ORAL_TABLET | ORAL | 0 refills | Status: DC

## 2017-12-17 MED ORDER — IBUPROFEN 600 MG PO TABS: 600.0000 mg | ORAL_TABLET | Freq: Three times a day (TID) | ORAL | 0 refills | Status: DC | PRN

## 2017-12-17 NOTE — Patient Instructions (Addendum)
   If you have lab work done today you will be contacted with your lab results within the next 2 weeks.  If you have not heard from us then please contact us. The fastest way to get your results is to register for My Chart.   IF you received an x-ray today, you will receive an invoice from [facility]. Please contact [facility] at [phone] with questions or concerns regarding your invoice.   IF you received labwork today, you will receive an invoice from [facility]. Please contact [facility] at [phone] with questions or concerns regarding your invoice.   Our billing staff will not be able to assist you with questions regarding bills from these companies.  You will be contacted with the lab results as soon as they are available. The fastest way to get your results is to activate your My Chart account. Instructions are located on the last page of this paperwork. If you have not heard from us regarding the results in 2 weeks, please contact this office.      Living With Anxiety After being diagnosed with an anxiety disorder, you may be relieved to know why you have felt or behaved a certain way. It is natural to also feel overwhelmed about the treatment ahead and what it will mean for your life. With care and support, you can manage this condition and recover from it. How to cope with anxiety Dealing with stress Stress is your body's reaction to life changes and events, both good and bad. Stress can last just a few hours or it can be ongoing. Stress can play a major role in anxiety, so it is important to learn both how to cope with stress and how to think about it differently. Talk with your health care provider or a counselor to learn more about stress reduction. He or she may suggest some stress reduction techniques, such as:  Music therapy. This can include creating or listening to music that you enjoy and that inspires you.  Mindfulness-based meditation. This  involves being aware of your normal breaths, rather than trying to control your breathing. It can be done while sitting or walking.  Centering prayer. This is a kind of meditation that involves focusing on a word, phrase, or sacred image that is meaningful to you and that brings you peace.  Deep breathing. To do this, expand your stomach and inhale slowly through your nose. Hold your breath for 3-5 seconds. Then exhale slowly, allowing your stomach muscles to relax.  Self-talk. This is a skill where you identify thought patterns that lead to anxiety reactions and correct those thoughts.  Muscle relaxation. This involves tensing muscles then relaxing them.  Choose a stress reduction technique that fits your lifestyle and personality. Stress reduction techniques take time and practice. Set aside 5-15 minutes a day to do them. Therapists can offer training in these techniques. The training may be covered by some insurance plans. Other things you can do to manage stress include:  Keeping a stress diary. This can help you learn what triggers your stress and ways to control your response.  Thinking about how you respond to certain situations. You may not be able to control everything, but you can control your reaction.  Making time for activities that help you relax, and not feeling guilty about spending your time in this way.  Therapy combined with coping and stress-reduction skills provides the best chance for successful treatment. Medicines Medicines can help ease symptoms. Medicines for   anxiety include:  Anti-anxiety drugs.  Antidepressants.  Beta-blockers.  Medicines may be used as the main treatment for anxiety disorder, along with therapy, or if other treatments are not working. Medicines should be prescribed by a health care provider. Relationships Relationships can play a big part in helping you recover. Try to spend more time connecting with trusted friends and family members.  Consider going to couples counseling, taking family education classes, or going to family therapy. Therapy can help you and others better understand the condition. How to recognize changes in your condition Everyone has a different response to treatment for anxiety. Recovery from anxiety happens when symptoms decrease and stop interfering with your daily activities at home or work. This may mean that you will start to:  Have better concentration and focus.  Sleep better.  Be less irritable.  Have more energy.  Have improved memory.  It is important to recognize when your condition is getting worse. Contact your health care provider if your symptoms interfere with home or work and you do not feel like your condition is improving. Where to find help and support: You can get help and support from these sources:  Self-help groups.  Online and community organizations.  A trusted spiritual leader.  Couples counseling.  Family education classes.  Family therapy.  Follow these instructions at home:  Eat a healthy diet that includes plenty of vegetables, fruits, whole grains, low-fat dairy products, and lean protein. Do not eat a lot of foods that are high in solid fats, added sugars, or salt.  Exercise. Most adults should do the following: ? Exercise for at least 150 minutes each week. The exercise should increase your heart rate and make you sweat (moderate-intensity exercise). ? Strengthening exercises at least twice a week.  Cut down on caffeine, tobacco, alcohol, and other potentially harmful substances.  Get the right amount and quality of sleep. Most adults need 7-9 hours of sleep each night.  Make choices that simplify your life.  Take over-the-counter and prescription medicines only as told by your health care provider.  Avoid caffeine, alcohol, and certain over-the-counter cold medicines. These may make you feel worse. Ask your pharmacist which medicines to  avoid.  Keep all follow-up visits as told by your health care provider. This is important. Questions to ask your health care provider  Would I benefit from therapy?  How often should I follow up with a health care provider?  How long do I need to take medicine?  Are there any long-term side effects of my medicine?  Are there any alternatives to taking medicine? Contact a health care provider if:  You have a hard time staying focused or finishing daily tasks.  You spend many hours a day feeling worried about everyday life.  You become exhausted by worry.  You start to have headaches, feel tense, or have nausea.  You urinate more than normal.  You have diarrhea. Get help right away if:  You have a racing heart and shortness of breath.  You have thoughts of hurting yourself or others. If you ever feel like you may hurt yourself or others, or have thoughts about taking your own life, get help right away. You can go to your nearest emergency department or call:  Your local emergency services (911 in the [place]  A suicide crisis helpline, such as the National Suicide Prevention Lifeline at 1-800-273-8255. This is open 24-hours a day.  Summary  Taking steps to deal with stress can help calm   you.  Medicines cannot cure anxiety disorders, but they can help ease symptoms.  Family, friends, and partners can play a big part in helping you recover from an anxiety disorder. This information is not intended to replace advice given to you by your health care provider. Make sure you discuss any questions you have with your health care provider. Document Released: 02/18/2016 Document Revised: 02/18/2016 Document Reviewed: 02/18/2016 Elsevier Interactive Patient Education  2018 [employer].  Hypertension Hypertension, commonly called high blood pressure, is when the force of blood pumping through the arteries is too strong. The arteries are the blood vessels that carry blood from the  heart throughout the body. Hypertension forces the heart to work harder to pump blood and may cause arteries to become narrow or stiff. Having untreated or uncontrolled hypertension can cause heart attacks, strokes, kidney disease, and other problems. A blood pressure reading consists of a higher number over a lower number. Ideally, your blood pressure should be below 120/80. The first ("top") number is called the systolic pressure. It is a measure of the pressure in your arteries as your heart beats. The second ("bottom") number is called the diastolic pressure. It is a measure of the pressure in your arteries as the heart relaxes. What are the causes? The cause of this condition is not known. What increases the risk? Some risk factors for high blood pressure are under your control. Others are not. Factors you can change  Smoking.  Having type 2 diabetes mellitus, high cholesterol, or both.  Not getting enough exercise or physical activity.  Being overweight.  Having too much fat, sugar, calories, or salt (sodium) in your diet.  Drinking too much alcohol. Factors that are difficult or impossible to change  Having chronic kidney disease.  Having a family history of high blood pressure.  Age. Risk increases with age.  Race. You may be at higher risk if you are African-American.  Gender. Men are at higher risk than women before age 77. After age 8, women are at higher risk than men.  Having obstructive sleep apnea.  Stress. What are the signs or symptoms? Extremely high blood pressure (hypertensive crisis) may cause:  Headache.  Anxiety.  Shortness of breath.  Nosebleed.  Nausea and vomiting.  Severe chest pain.  Jerky movements you cannot control (seizures).  How is this diagnosed? This condition is diagnosed by measuring your blood pressure while you are seated, with your arm resting on a surface. The cuff of the blood pressure monitor will be placed directly  against the skin of your upper arm at the level of your heart. It should be measured at least twice using the same arm. Certain conditions can cause a difference in blood pressure between your right and left arms. Certain factors can cause blood pressure readings to be lower or higher than normal (elevated) for a short period of time:  When your blood pressure is higher when you are in a health care provider's office than when you are at home, this is called white coat hypertension. Most people with this condition do not need medicines.  When your blood pressure is higher at home than when you are in a health care provider's office, this is called masked hypertension. Most people with this condition may need medicines to control blood pressure.  If you have a high blood pressure reading during one visit or you have normal blood pressure with other risk factors:  You may be asked to return on a different  day to have your blood pressure checked again.  You may be asked to monitor your blood pressure at home for 1 week or longer.  If you are diagnosed with hypertension, you may have other blood or imaging tests to help your health care provider understand your overall risk for other conditions. How is this treated? This condition is treated by making healthy lifestyle changes, such as eating healthy foods, exercising more, and reducing your alcohol intake. Your health care provider may prescribe medicine if lifestyle changes are not enough to get your blood pressure under control, and if:  Your systolic blood pressure is above 130.  Your diastolic blood pressure is above 80.  Your personal target blood pressure may vary depending on your medical conditions, your age, and other factors. Follow these instructions at home: Eating and drinking  Eat a diet that is high in fiber and potassium, and low in sodium, added sugar, and fat. An example eating plan is called the DASH (Dietary Approaches to  Stop Hypertension) diet. To eat this way: ? Eat plenty of fresh fruits and vegetables. Try to fill half of your plate at each meal with fruits and vegetables. ? Eat whole grains, such as whole wheat pasta, brown rice, or whole grain bread. Fill about one quarter of your plate with whole grains. ? Eat or drink low-fat dairy products, such as skim milk or low-fat yogurt. ? Avoid fatty cuts of meat, processed or cured meats, and poultry with skin. Fill about one quarter of your plate with lean proteins, such as fish, chicken without skin, beans, eggs, and tofu. ? Avoid premade and processed foods. These tend to be higher in sodium, added sugar, and fat.  Reduce your daily sodium intake. Most people with hypertension should eat less than 1,500 mg of sodium a day.  Limit alcohol intake to no more than 1 drink a day for nonpregnant women and 2 drinks a day for men. One drink equals 12 oz of beer, 5 oz of wine, or 1 oz of hard liquor. Lifestyle  Work with your health care provider to maintain a healthy body weight or to lose weight. Ask what an ideal weight is for you.  Get at least 30 minutes of exercise that causes your heart to beat faster (aerobic exercise) most days of the week. Activities may include walking, swimming, or biking.  Include exercise to strengthen your muscles (resistance exercise), such as pilates or lifting weights, as part of your weekly exercise routine. Try to do these types of exercises for 30 minutes at least 3 days a week.  Do not use any products that contain nicotine or tobacco, such as cigarettes and e-cigarettes. If you need help quitting, ask your health care provider.  Monitor your blood pressure at home as told by your health care provider.  Keep all follow-up visits as told by your health care provider. This is important. Medicines  Take over-the-counter and prescription medicines only as told by your health care provider. Follow directions carefully. Blood  pressure medicines must be taken as prescribed.  Do not skip doses of blood pressure medicine. Doing this puts you at risk for problems and can make the medicine less effective.  Ask your health care provider about side effects or reactions to medicines that you should watch for. Contact a health care provider if:  You think you are having a reaction to a medicine you are taking.  You have headaches that keep coming back (recurring).  You feel  dizzy.  You have swelling in your ankles.  You have trouble with your vision. Get help right away if:  You develop a severe headache or confusion.  You have unusual weakness or numbness.  You feel faint.  You have severe pain in your chest or abdomen.  You vomit repeatedly.  You have trouble breathing. Summary  Hypertension is when the force of blood pumping through your arteries is too strong. If this condition is not controlled, it may put you at risk for serious complications.  Your personal target blood pressure may vary depending on your medical conditions, your age, and other factors. For most people, a normal blood pressure is less than 120/80.  Hypertension is treated with lifestyle changes, medicines, or a combination of both. Lifestyle changes include weight loss, eating a healthy, low-sodium diet, exercising more, and limiting alcohol. This information is not intended to replace advice given to you by your health care provider. Make sure you discuss any questions you have with your health care provider. Document Released: 02/23/2005 Document Revised: 01/22/2016 Document Reviewed: 01/22/2016 Elsevier Interactive Patient Education  [employer].

## 2017-12-17 NOTE — Assessment & Plan Note (Signed)
Uncontrolled blood pressure with systolic still out of target range.  Has been taking only 2.5 mg of lisinopril.  Will increase to 10 mg daily.  Follow-up in 3 to 6 months.

## 2017-12-17 NOTE — Progress Notes (Signed)
Katie Gardner 62 y.o.   Chief Complaint  Patient presents with  . Hypertension    follow-up   . Depression  . Medication Refill    HISTORY OF PRESENT ILLNESS: This is a 62 y.o. female with history of hypertension, depression and generalized anxiety disorder here for follow-up on medication refill.  Doing well has no complaints. Blood pressure readings at home show a persistently elevated systolic blood pressure.  HPI   [name]   Medication Sig Start Date End Date Taking? Authorizing Provider  cetirizine (ZYRTEC) 10 MG tablet Take 1 tablet (10 mg total) by mouth daily. 10/02/17  Yes Barnett Abu, [place] D, PA-C  cyclobenzaprine (FLEXERIL) 10 MG tablet Take 10 mg by mouth 3 (three) times daily as needed for muscle spasms.   Yes [provider]  diazepam (VALIUM) 5 MG tablet TAKE 1 TABLET BY MOUTH EVERY DAY AS NEEDED FOR ANXIETY 07/12/17  Yes Taisley Mordan, Eilleen Kempf, MD  fluticasone [facility]) 50 MCG/ACT nasal spray Place 2 sprays into both nostrils daily. 10/02/17  Yes Barnett Abu, [place] D, PA-C  lisinopril (PRINIVIL,ZESTRIL) 2.5 MG tablet Take 1 tablet (2.5 mg total) by mouth daily. 10/02/17  Yes Barnett Abu, [place] D, PA-C  Olopatadine HCl (PATADAY) 0.2 % SOLN Apply 1 drop to eye daily. 10/02/17  Yes Barnett Abu, [place] D, PA-C  sertraline (ZOLOFT) 100 MG tablet TAKE 1 TABLET(100 MG) BY MOUTH DAILY 11/10/17  Yes Georgina Quint, MD  simvastatin (ZOCOR) 40 MG tablet TAKE 1 TABLET(40 MG) BY MOUTH DAILY 09/16/17  Yes Georgina Quint, MD    Allergies  Allergen Reactions  . Hydrocodone Nausea And Vomiting    dizzy  . Mucinex Dm [Dm-Guaifenesin Er] Swelling    Sob, throat closing    Patient Active Problem List   Diagnosis Date Noted  . Chronic neck pain 12/17/2017  . Tendonitis of elbow, right 04/12/2017  . Right forearm pain 04/12/2017  . Contusion of right chest wall 01/15/2017  . Rib pain 01/15/2017  . Shortness of breath 11/13/2016  . Depression  11/13/2016  . Chronic anxiety 11/13/2016  . Chronic sore throat 11/13/2016  . Acute laryngitis 07/15/2016  . Chest pain   . Hot flashes   . Hematuria   . Allergy   . Anxiety   . Hyperlipidemia     Past Medical History:  Diagnosis Date  . Allergy    seasonal  . Anxiety   . Depression   . Hematuria   . Hot flashes   . Hx of cardiovascular stress test    Lex MV 12/13:  EF 68%, mild apical thinning, no ischemia  . Hyperlipidemia     Past Surgical History:  Procedure Laterality Date  . BREAST LUMPECTOMY     right breast, not cancer  . Carpel tunnel    . ECTOPIC PREGNANCY SURGERY      Social History   Socioeconomic History  . Marital status: Legally Separated    Spouse name: Not on file  . Number of children: 1  . Years of education: 8th grade  . Highest education level: Not on file  Occupational History    Employer: UNEMPLOYED  Social Needs  . Financial resource strain: Not hard at all  . Food insecurity:    Worry: Never true    Inability: Never true  . Transportation needs:    Medical: No    Non-medical: No  Tobacco Use  . Smoking status: Former Games developer  . Smokeless tobacco: Never Used  Substance and Sexual  Activity  . Alcohol use: No  . Drug use: No  . Sexual activity: Yes  Lifestyle  . Physical activity:    Days per week: 0 days    Minutes per session: 0 min  . Stress: To some extent  Relationships  . Social connections:    Talks on phone: Once a week    Gets together: Never    Attends religious service: Never    Active member of club or organization: No    Attends meetings of clubs or organizations: Never    Relationship status: Separated  . Intimate partner violence:    Fear of current or ex partner: No    Emotionally abused: No    Physically abused: No    Forced sexual activity: No  Other Topics Concern  . Not on file  Social History Narrative  . Not on file    Family History  Problem Relation Age of Onset  . Heart disease Mother     . Diabetes Mother   . Esophageal cancer Father        43's  . Colon cancer Neg Hx      Review of Systems  Constitutional: Negative.  Negative for chills, fever and weight loss.  HENT: Negative.   Eyes: Negative.  Negative for blurred vision and double vision.  Respiratory: Negative.  Negative for cough and shortness of breath.   Cardiovascular: Negative.  Negative for chest pain and palpitations.  Gastrointestinal: Negative.  Negative for abdominal pain, diarrhea, nausea and vomiting.  Genitourinary: Negative.   Skin: Negative.  Negative for rash.  Neurological: Negative.  Negative for dizziness and headaches.  Endo/Heme/Allergies: Negative.   Psychiatric/Behavioral: The patient is nervous/anxious.   All other systems reviewed and are negative.   Vitals:   12/17/17 1128  BP: 128/72  Pulse: 62  Resp: 16  Temp: 98 F (36.7 C)  SpO2: 97%    Physical Exam  Constitutional: She is oriented to person, place, and time. She appears well-developed and well-nourished.  HENT:  Head: Normocephalic and atraumatic.  Nose: Nose normal.  Mouth/Throat: Oropharynx is clear and moist.  Eyes: Pupils are equal, round, and reactive to light. Conjunctivae and EOM are normal.  Neck: Normal range of motion. Neck supple.  Cardiovascular: Normal rate, regular rhythm and normal heart sounds.  Pulmonary/Chest: Effort normal and breath sounds normal.  Abdominal: Soft. Bowel sounds are normal. She exhibits no distension. There is no tenderness.  Musculoskeletal: Normal range of motion. She exhibits no edema or tenderness.  Neurological: She is alert and oriented to person, place, and time. No sensory deficit. She exhibits normal muscle tone.  Skin: Skin is warm and dry. Capillary refill takes less than 2 seconds.  Psychiatric: She has a normal mood and affect. Her behavior is normal.  Vitals reviewed.    ASSESSMENT & PLAN: Essential hypertension Uncontrolled blood pressure with systolic still  out of target range.  Has been taking only 2.5 mg of lisinopril.  Will increase to 10 mg daily.  Follow-up in 3 to 6 months.  Jamani was seen today for hypertension, depression and medication refill.  Diagnoses and all orders for this visit:  Essential hypertension -     lisinopril (PRINIVIL,ZESTRIL) 10 MG tablet; Take 1 tablet (10 mg total) by mouth daily.  Seasonal allergies -     cetirizine (ZYRTEC) 10 MG tablet; Take 1 tablet (10 mg total) by mouth daily.  Chronic anxiety -     sertraline (ZOLOFT) 100 MG tablet; TAKE  1 TABLET(100 MG) BY MOUTH DAILY -     diazepam (VALIUM) 5 MG tablet; TAKE 1 TABLET BY MOUTH EVERY DAY AS NEEDED FOR ANXIETY  Depression, unspecified depression type -     sertraline (ZOLOFT) 100 MG tablet; TAKE 1 TABLET(100 MG) BY MOUTH DAILY  Other orders -     cyclobenzaprine (FLEXERIL) 5 MG tablet; Take 1 tablet (5 mg total) by mouth 3 (three) times daily as needed for muscle spasms. -     ibuprofen (ADVIL,MOTRIN) 600 MG tablet; Take 1 tablet (600 mg total) by mouth every 8 (eight) hours as needed.    Patient Instructions       If you have lab work done today you will be contacted with your lab results within the next 2 weeks.  If you have not heard from [place] then please contact us. The fastest way to get your results is to register for My Chart.   IF you received an x-ray today, you will receive an invoice from [facility]. Please contact [facility] at [phone] with questions or concerns regarding your invoice.   IF you received labwork today, you will receive an invoice from [facility]. Please contact [facility] at [phone] with questions or concerns regarding your invoice.   Our billing staff will not be able to assist you with questions regarding bills from these companies.  You will be contacted with the lab results as soon as they are available. The fastest way to get your results is to activate your My Chart account.  Instructions are located on the last page of this paperwork. If you have not heard from [place] regarding the results in 2 weeks, please contact this office.      Living With Anxiety After being diagnosed with an anxiety disorder, you may be relieved to know why you have felt or behaved a certain way. It is natural to also feel overwhelmed about the treatment ahead and what it will mean for your life. With care and support, you can manage this condition and recover from it. How to cope with anxiety Dealing with stress Stress is your body's reaction to life changes and events, both good and bad. Stress can last just a few hours or it can be ongoing. Stress can play a major role in anxiety, so it is important to learn both how to cope with stress and how to think about it differently. Talk with your health care provider or a counselor to learn more about stress reduction. He or she may suggest some stress reduction techniques, such as:  Music therapy. This can include creating or listening to music that you enjoy and that inspires you.  Mindfulness-based meditation. This involves being aware of your normal breaths, rather than trying to control your breathing. It can be done while sitting or walking.  Centering prayer. This is a kind of meditation that involves focusing on a word, phrase, or sacred image that is meaningful to you and that brings you peace.  Deep breathing. To do this, expand your stomach and inhale slowly through your nose. Hold your breath for 3-5 seconds. Then exhale slowly, allowing your stomach muscles to relax.  Self-talk. This is a skill where you identify thought patterns that lead to anxiety reactions and correct those thoughts.  Muscle relaxation. This involves tensing muscles then relaxing them.  Choose a stress reduction technique that fits your lifestyle and personality. Stress reduction techniques take time and practice. Set aside 5-15 minutes a day to do them.  Therapists can offer training in these techniques. The training may be covered by some insurance plans. Other things you can do to manage stress include:  Keeping a stress diary. This can help you learn what triggers your stress and ways to control your response.  Thinking about how you respond to certain situations. You may not be able to control everything, but you can control your reaction.  Making time for activities that help you relax, and not feeling guilty about spending your time in this way.  Therapy combined with coping and stress-reduction skills provides the best chance for successful treatment. Medicines Medicines can help ease symptoms. Medicines for anxiety include:  Anti-anxiety drugs.  Antidepressants.  Beta-blockers.  Medicines may be used as the main treatment for anxiety disorder, along with therapy, or if other treatments are not working. Medicines should be prescribed by a health care provider. Relationships Relationships can play a big part in helping you recover. Try to spend more time connecting with trusted friends and family members. Consider going to couples counseling, taking family education classes, or going to family therapy. Therapy can help you and others better understand the condition. How to recognize changes in your condition Everyone has a different response to treatment for anxiety. Recovery from anxiety happens when symptoms decrease and stop interfering with your daily activities at home or work. This may mean that you will start to:  Have better concentration and focus.  Sleep better.  Be less irritable.  Have more energy.  Have improved memory.  It is important to recognize when your condition is getting worse. Contact your health care provider if your symptoms interfere with home or work and you do not feel like your condition is improving. Where to find help and support: You can get help and support from these sources:  Self-help  groups.  Online and [employer].  A trusted spiritual leader.  Couples counseling.  Family education classes.  Family therapy.  Follow these instructions at home:  Eat a healthy diet that includes plenty of vegetables, fruits, whole grains, low-fat dairy products, and lean protein. Do not eat a lot of foods that are high in solid fats, added sugars, or salt.  Exercise. Most adults should do the following: ? Exercise for at least 150 minutes each week. The exercise should increase your heart rate and make you sweat (moderate-intensity exercise). ? Strengthening exercises at least twice a week.  Cut down on caffeine, tobacco, alcohol, and other potentially harmful substances.  Get the right amount and quality of sleep. Most adults need 7-9 hours of sleep each night.  Make choices that simplify your life.  Take over-the-counter and prescription medicines only as told by your health care provider.  Avoid caffeine, alcohol, and certain over-the-counter cold medicines. These may make you feel worse. Ask your pharmacist which medicines to avoid.  Keep all follow-up visits as told by your health care provider. This is important. Questions to ask your health care provider  Would I benefit from therapy?  How often should I follow up with a health care provider?  How long do I need to take medicine?  Are there any long-term side effects of my medicine?  Are there any alternatives to taking medicine? Contact a health care provider if:  You have a hard time staying focused or finishing daily tasks.  You spend many hours a day feeling worried about everyday life.  You become exhausted by worry.  You start to have headaches, feel tense, or  have nausea.  You urinate more than normal.  You have diarrhea. Get help right away if:  You have a racing heart and shortness of breath.  You have thoughts of hurting yourself or others. If you ever feel like you may hurt  yourself or others, or have thoughts about taking your own life, get help right away. You can go to your nearest emergency department or call:  Your local emergency services (911 in the [place]  A suicide crisis helpline, such as the National Suicide Prevention Lifeline at 616-314-1052. This is open 24-hours a day.  Summary  Taking steps to deal with stress can help calm you.  Medicines cannot cure anxiety disorders, but they can help ease symptoms.  Family, friends, and partners can play a big part in helping you recover from an anxiety disorder. This information is not intended to replace advice given to you by your health care provider. Make sure you discuss any questions you have with your health care provider. Document Released: 02/18/2016 Document Revised: 02/18/2016 Document Reviewed: 02/18/2016 Elsevier Interactive Patient Education  2018 [employer].  Hypertension Hypertension, commonly called high blood pressure, is when the force of blood pumping through the arteries is too strong. The arteries are the blood vessels that carry blood from the heart throughout the body. Hypertension forces the heart to work harder to pump blood and may cause arteries to become narrow or stiff. Having untreated or uncontrolled hypertension can cause heart attacks, strokes, kidney disease, and other problems. A blood pressure reading consists of a higher number over a lower number. Ideally, your blood pressure should be below 120/80. The first ("top") number is called the systolic pressure. It is a measure of the pressure in your arteries as your heart beats. The second ("bottom") number is called the diastolic pressure. It is a measure of the pressure in your arteries as the heart relaxes. What are the causes? The cause of this condition is not known. What increases the risk? Some risk factors for high blood pressure are under your control. Others are not. Factors you can  change  Smoking.  Having type 2 diabetes mellitus, high cholesterol, or both.  Not getting enough exercise or physical activity.  Being overweight.  Having too much fat, sugar, calories, or salt (sodium) in your diet.  Drinking too much alcohol. Factors that are difficult or impossible to change  Having chronic kidney disease.  Having a family history of high blood pressure.  Age. Risk increases with age.  Race. You may be at higher risk if you are African-American.  Gender. Men are at higher risk than women before age 24. After age 80, women are at higher risk than men.  Having obstructive sleep apnea.  Stress. What are the signs or symptoms? Extremely high blood pressure (hypertensive crisis) may cause:  Headache.  Anxiety.  Shortness of breath.  Nosebleed.  Nausea and vomiting.  Severe chest pain.  Jerky movements you cannot control (seizures).  How is this diagnosed? This condition is diagnosed by measuring your blood pressure while you are seated, with your arm resting on a surface. The cuff of the blood pressure monitor will be placed directly against the skin of your upper arm at the level of your heart. It should be measured at least twice using the same arm. Certain conditions can cause a difference in blood pressure between your right and left arms. Certain factors can cause blood pressure readings to be lower or higher than normal (elevated) for  a short period of time:  When your blood pressure is higher when you are in a health care provider's office than when you are at home, this is called white coat hypertension. Most people with this condition do not need medicines.  When your blood pressure is higher at home than when you are in a health care provider's office, this is called masked hypertension. Most people with this condition may need medicines to control blood pressure.  If you have a high blood pressure reading during one visit or you have  normal blood pressure with other risk factors:  You may be asked to return on a different day to have your blood pressure checked again.  You may be asked to monitor your blood pressure at home for 1 week or longer.  If you are diagnosed with hypertension, you may have other blood or imaging tests to help your health care provider understand your overall risk for other conditions. How is this treated? This condition is treated by making healthy lifestyle changes, such as eating healthy foods, exercising more, and reducing your alcohol intake. Your health care provider may prescribe medicine if lifestyle changes are not enough to get your blood pressure under control, and if:  Your systolic blood pressure is above 130.  Your diastolic blood pressure is above 80.  Your personal target blood pressure may vary depending on your medical conditions, your age, and other factors. Follow these instructions at home: Eating and drinking  Eat a diet that is high in fiber and potassium, and low in sodium, added sugar, and fat. An example eating plan is called the DASH (Dietary Approaches to Stop Hypertension) diet. To eat this way: ? Eat plenty of fresh fruits and vegetables. Try to fill half of your plate at each meal with fruits and vegetables. ? Eat whole grains, such as whole wheat pasta, brown rice, or whole grain bread. Fill about one quarter of your plate with whole grains. ? Eat or drink low-fat dairy products, such as skim milk or low-fat yogurt. ? Avoid fatty cuts of meat, processed or cured meats, and poultry with skin. Fill about one quarter of your plate with lean proteins, such as fish, chicken without skin, beans, eggs, and tofu. ? Avoid premade and processed foods. These tend to be higher in sodium, added sugar, and fat.  Reduce your daily sodium intake. Most people with hypertension should eat less than 1,500 mg of sodium a day.  Limit alcohol intake to no more than 1 drink a day for  nonpregnant women and 2 drinks a day for men. One drink equals 12 oz of beer, 5 oz of wine, or 1 oz of hard liquor. Lifestyle  Work with your health care provider to maintain a healthy body weight or to lose weight. Ask what an ideal weight is for you.  Get at least 30 minutes of exercise that causes your heart to beat faster (aerobic exercise) most days of the week. Activities may include walking, swimming, or biking.  Include exercise to strengthen your muscles (resistance exercise), such as pilates or lifting weights, as part of your weekly exercise routine. Try to do these types of exercises for 30 minutes at least 3 days a week.  Do not use any products that contain nicotine or tobacco, such as cigarettes and e-cigarettes. If you need help quitting, ask your health care provider.  Monitor your blood pressure at home as told by your health care provider.  Keep all follow-up visits  as told by your health care provider. This is important. Medicines  Take over-the-counter and prescription medicines only as told by your health care provider. Follow directions carefully. Blood pressure medicines must be taken as prescribed.  Do not skip doses of blood pressure medicine. Doing this puts you at risk for problems and can make the medicine less effective.  Ask your health care provider about side effects or reactions to medicines that you should watch for. Contact a health care provider if:  You think you are having a reaction to a medicine you are taking.  You have headaches that keep coming back (recurring).  You feel dizzy.  You have swelling in your ankles.  You have trouble with your vision. Get help right away if:  You develop a severe headache or confusion.  You have unusual weakness or numbness.  You feel faint.  You have severe pain in your chest or abdomen.  You vomit repeatedly.  You have trouble breathing. Summary  Hypertension is when the force of blood pumping  through your arteries is too strong. If this condition is not controlled, it may put you at risk for serious complications.  Your personal target blood pressure may vary depending on your medical conditions, your age, and other factors. For most people, a normal blood pressure is less than 120/80.  Hypertension is treated with lifestyle changes, medicines, or a combination of both. Lifestyle changes include weight loss, eating a healthy, low-sodium diet, exercising more, and limiting alcohol. This information is not intended to replace advice given to you by your health care provider. Make sure you discuss any questions you have with your health care provider. Document Released: 02/23/2005 Document Revised: 01/22/2016 Document Reviewed: 01/22/2016 Elsevier Interactive Patient Education  2018 [employer] Inc[employer]      Edwina Barth, MD [facility]

## 2018-01-03 ENCOUNTER — Ambulatory Visit (INDEPENDENT_AMBULATORY_CARE_PROVIDER_SITE_OTHER): Payer: 59 | Admitting: Family Medicine

## 2018-01-03 ENCOUNTER — Encounter: Payer: Self-pay | Admitting: Family Medicine

## 2018-01-03 ENCOUNTER — Other Ambulatory Visit: Payer: Self-pay

## 2018-01-03 VITALS — BP 142/78 | Ht 60.0 in | Wt 142.0 lb

## 2018-01-03 DIAGNOSIS — I10 Essential (primary) hypertension: Secondary | ICD-10-CM

## 2018-01-03 DIAGNOSIS — R002 Palpitations: Secondary | ICD-10-CM

## 2018-01-03 NOTE — Patient Instructions (Signed)
° ° ° °  If you have lab work done today you will be contacted with your lab results within the next 2 weeks.  If you have not heard from us then please contact us. The fastest way to get your results is to register for My Chart. ° ° °IF you received an x-ray today, you will receive an invoice from [facility]. Please contact [facility] at [phone] with questions or concerns regarding your invoice.  ° °IF you received labwork today, you will receive an invoice from [facility]. Please contact [facility] at [phone] with questions or concerns regarding your invoice.  ° °Our billing staff will not be able to assist you with questions regarding bills from these companies. ° °You will be contacted with the lab results as soon as they are available. The fastest way to get your results is to activate your My Chart account. Instructions are located on the last page of this paperwork. If you have not heard from us regarding the results in 2 weeks, please contact this office. °  ° ° ° °

## 2018-01-03 NOTE — Progress Notes (Signed)
10/28/20192:21 PM  Katie Gardner 1956-01-21, 62 y.o. female [id number]  Chief Complaint  Patient presents with  . Irregular Heart Beat    while cooking, heart began to beat really fast and she began to shake. First time this has happened.     HPI:   Patient is a 62 y.o. female with past medical history significant for HTH anxiety and depression who presents today for palpitations  Woke up this morning Around noon when she was shaking, felt her heart beating fast/strong Checked her blood pressure, 150/73, 68 No nausea, no diaphoresis, no SOB, no radiation A bit light headed Chest a bit tight, now resolved Has never happened before  Patient Care Team: Georgina Quint, MD as PCP - General (Internal Medicine)  Fall Risk  01/03/2018 12/17/2017 10/08/2017 10/02/2017 04/12/2017  Falls in the past year? No No No No Yes  Number falls in past yr: - - - - 2 or more  Injury with Fall? - - - - No  Comment - - - - -  Risk for fall due to : - - - - Other (Comment)  Risk for fall due to: Comment - - - - tripped and fell 2 times  Follow up - - - - Falls prevention discussed     Depression screen [facility] 2/9 01/03/2018 12/17/2017 10/08/2017  Decreased Interest 0 0 0  Down, Depressed, Hopeless 0 0 0  PHQ - 2 Score 0 0 0  Altered sleeping - - -  Tired, decreased energy - - -  Change in appetite - - -  Feeling bad or failure about yourself  - - -  Trouble concentrating - - -  Moving slowly or fidgety/restless - - -  Suicidal thoughts - - -  PHQ-9 Score - - -  Difficult doing work/chores - - -    Allergies  Allergen Reactions  . Hydrocodone Nausea And Vomiting    dizzy  . Mucinex Dm [Dm-Guaifenesin Er] Swelling    Sob, throat closing    [name]   Medication Sig Start Date End Date Taking? Authorizing Provider  cetirizine (ZYRTEC) 10 MG tablet Take 1 tablet (10 mg total) by mouth daily. 12/17/17  Yes Sagardia, Eilleen Kempf, MD  cyclobenzaprine (FLEXERIL) 5 MG  tablet Take 1 tablet (5 mg total) by mouth 3 (three) times daily as needed for muscle spasms. 12/17/17  Yes Sagardia, Eilleen Kempf, MD  diazepam (VALIUM) 5 MG tablet TAKE 1 TABLET BY MOUTH EVERY DAY AS NEEDED FOR ANXIETY 12/17/17  Yes Sagardia, Eilleen Kempf, MD  lisinopril (PRINIVIL,ZESTRIL) 10 MG tablet Take 1 tablet (10 mg total) by mouth daily. 12/17/17  Yes Georgina Quint, MD  sertraline (ZOLOFT) 100 MG tablet TAKE 1 TABLET(100 MG) BY MOUTH DAILY 12/17/17  Yes Georgina Quint, MD  simvastatin (ZOCOR) 40 MG tablet TAKE 1 TABLET(40 MG) BY MOUTH DAILY 09/16/17  Yes Georgina Quint, MD    Past Medical History:  Diagnosis Date  . Allergy    seasonal  . Anxiety   . Depression   . Hematuria   . Hot flashes   . Hx of cardiovascular stress test    Lex MV 12/13:  EF 68%, mild apical thinning, no ischemia  . Hyperlipidemia     Past Surgical History:  Procedure Laterality Date  . BREAST LUMPECTOMY     right breast, not cancer  . Carpel tunnel    . ECTOPIC PREGNANCY SURGERY      Social History  Tobacco Use  . Smoking status: Former Games developer  . Smokeless tobacco: Never Used  Substance Use Topics  . Alcohol use: No    Family History  Problem Relation Age of Onset  . Heart disease Mother   . Diabetes Mother   . Esophageal cancer Father        70's  . Colon cancer Neg Hx     ROS Per hpi  OBJECTIVE:  Blood pressure (!) 142/78, height 5' (1.524 m), weight 142 lb (64.4 kg). Body mass index is 27.73 kg/m.   BP Readings from Last 3 Encounters:  01/03/18 (!) 142/78  12/17/17 128/72  10/08/17 (!) 157/82    Physical Exam  Constitutional: She is oriented to person, place, and time. She appears well-developed and well-nourished.  HENT:  Head: Normocephalic and atraumatic.  Mouth/Throat: Oropharynx is clear and moist. No oropharyngeal exudate.  Eyes: Pupils are equal, round, and reactive to light. Conjunctivae and EOM are normal. No scleral icterus.  Neck:  Neck supple.  Cardiovascular: Normal rate, regular rhythm and normal heart sounds. Exam reveals no gallop and no friction rub.  No murmur heard. Pulmonary/Chest: Effort normal and breath sounds normal. She has no wheezes. She has no rales.  Musculoskeletal: She exhibits no edema.  Neurological: She is alert and oriented to person, place, and time.  Skin: Skin is warm and dry.  Psychiatric: Her mood appears anxious.  Nursing note and vitals reviewed.  My interpretation of EKG:  NSR HR 65, normal intervals. While not captured on ekg, I was able to see ekg machine isolated PVCs that patient reported as symptomatic  ASSESSMENT and PLAN  1. Palpitations PVCs seen on telemetry today which she reports as symptomatic. Discussed with patient benign nature. Declined cards for holter eval. Will continue to monitor clinically. Strict ER precautions given. - EKG 12-Lead  2. Essential hypertension Slightly above goal today. She is a bit anxious. Continue checking BP at home. Re-evaluate at next visit. - CBC - Comprehensive metabolic panel - TSH - Lipid panel - Care order/instruction:   Return in about 4 weeks (around 01/31/2018) for Dr Alvy Bimler.    Myles Lipps, MD Primary Care at [facility] [address] [place], [place] [address] Ph.  [phone] Fax [phone]

## 2018-01-04 LAB — COMPREHENSIVE METABOLIC PANEL
ALT: 21 IU/L (ref 0–32)
AST: 21 IU/L (ref 0–40)
Albumin/Globulin Ratio: 2.1 (ref 1.2–2.2)
Albumin: 4.6 g/dL (ref 3.6–4.8)
Alkaline Phosphatase: 96 IU/L (ref 39–117)
BUN/Creatinine Ratio: 19 (ref 12–28)
BUN: 16 mg/dL (ref 8–27)
Bilirubin Total: 0.3 mg/dL (ref 0.0–1.2)
CO2: 20 mmol/L (ref 20–29)
Calcium: 9.5 mg/dL (ref 8.7–10.3)
Chloride: 103 mmol/L (ref 96–106)
Creatinine, Ser: 0.83 mg/dL (ref 0.57–1.00)
GFR calc Af Amer: 87 mL/min/{1.73_m2} (ref >59)
GFR calc non Af Amer: 76 mL/min/{1.73_m2} (ref >59)
Globulin, Total: 2.2 g/dL (ref 1.5–4.5)
Glucose: 154 mg/dL — ABNORMAL HIGH (ref 65–99)
Potassium: 3.9 mmol/L (ref 3.5–5.2)
Sodium: 142 mmol/L (ref 134–144)
Total Protein: 6.8 g/dL (ref 6.0–8.5)

## 2018-01-04 LAB — LIPID PANEL
Chol/HDL Ratio: 3.9 ratio (ref 0.0–4.4)
Cholesterol, Total: 231 mg/dL — ABNORMAL HIGH (ref 100–199)
HDL: 60 mg/dL (ref >39)
LDL Calculated: 149 mg/dL — ABNORMAL HIGH (ref 0–99)
Triglycerides: 109 mg/dL (ref 0–149)
VLDL Cholesterol Cal: 22 mg/dL (ref 5–40)

## 2018-01-04 LAB — CBC
Hematocrit: 38.4 % (ref 34.0–46.6)
Hemoglobin: 12.8 g/dL (ref 11.1–15.9)
MCH: 27.8 pg (ref 26.6–33.0)
MCHC: 33.3 g/dL (ref 31.5–35.7)
MCV: 83 fL (ref 79–97)
Platelets: 214 10*3/uL (ref 150–450)
RBC: 4.61 x10E6/uL (ref 3.77–5.28)
RDW: 12.5 % (ref 12.3–15.4)
WBC: 5.3 10*3/uL (ref 3.4–10.8)

## 2018-01-04 LAB — TSH: TSH: 0.846 u[IU]/mL (ref 0.450–4.500)

## 2018-01-08 ENCOUNTER — Encounter: Payer: Self-pay | Admitting: Radiology

## 2018-02-04 ENCOUNTER — Ambulatory Visit: Payer: 59 | Admitting: Emergency Medicine

## 2018-02-15 ENCOUNTER — Ambulatory Visit: Payer: Self-pay

## 2018-02-15 NOTE — Telephone Encounter (Signed)
Patient called in with c/o "BP running high." She says "yesterday I was feeling dizzy, nauseated and my BP was 91/74. I didn't take my medicine. Today, my BP this morning 149/82, I took my medicine. I checked it again in 1 hour and it was 150/73. I checked, it again and it was 170/73, then again 211/68. I took an extra Lisinopril 2.5 mg to get my BP down. I am real nervous, so that's probably why it is up." I asked about other symptoms, she denies. According to protocol, see PCP within 3 days. No availability for office visit with PCP. Patient says she wants to see Dr. Alvy BimlerSagardia. Same Day slot available on Thursday, 02/17/18 at 1640, which patient says she will take. I called the office and [facility] to ask if the slot can be opened to schedule the patient. She says she doesn't have the approval from the provider, so she's not able to do it. She advised the patient to call back tomorrow in order to be scheduled. I advised the patient to call back tomorrow to be schedule on Thursday, 02/17/18 at 1640, care advice given, patient verbalized understanding.   Reason for Disposition . Systolic BP  >= 160 OR Diastolic >= 100  Answer Assessment - Initial Assessment Questions 1. BLOOD PRESSURE: "What is the blood pressure?" "Did you take at least two measurements 5 minutes apart?"     211/68; 170/73 2. ONSET: "When did you take your blood pressure?"     For the past few hours every hour 3. HOW: "How did you obtain the blood pressure?" (e.g., visiting nurse, automatic home BP monitor)     Automatic home BP monitor 4. HISTORY: "Do you have a history of high blood pressure?"     Yes 5. MEDICATIONS: "Are you taking any medications for blood pressure?" "Have you missed any doses recently?"     Yes; no missed doses 6. OTHER SYMPTOMS: "Do you have any symptoms?" (e.g., headache, chest pain, blurred vision, difficulty breathing, weakness)     No 7. PREGNANCY: "Is there any chance you are pregnant?" "When  was your last menstrual period?"     No  Protocols used: HIGH BLOOD PRESSURE-A-AH

## 2018-02-17 ENCOUNTER — Encounter: Payer: Self-pay | Admitting: Emergency Medicine

## 2018-02-17 ENCOUNTER — Ambulatory Visit (INDEPENDENT_AMBULATORY_CARE_PROVIDER_SITE_OTHER): Payer: 59 | Admitting: Emergency Medicine

## 2018-02-17 ENCOUNTER — Other Ambulatory Visit: Payer: Self-pay

## 2018-02-17 VITALS — BP 178/84 | HR 75 | Temp 98.5°F | Ht 60.0 in | Wt 139.4 lb

## 2018-02-17 DIAGNOSIS — I10 Essential (primary) hypertension: Secondary | ICD-10-CM | POA: Diagnosis not present

## 2018-02-17 DIAGNOSIS — F411 Generalized anxiety disorder: Secondary | ICD-10-CM | POA: Diagnosis not present

## 2018-02-17 DIAGNOSIS — R0789 Other chest pain: Secondary | ICD-10-CM | POA: Diagnosis not present

## 2018-02-17 DIAGNOSIS — F419 Anxiety disorder, unspecified: Secondary | ICD-10-CM

## 2018-02-17 MED ORDER — DIAZEPAM 5 MG PO TABS: ORAL_TABLET | ORAL | 0 refills | Status: DC

## 2018-02-17 NOTE — Patient Instructions (Addendum)
If you have lab work done today you will be contacted with your lab results within the next 2 weeks.  If you have not heard from [place] then please contact us. The fastest way to get your results is to register for My Chart.   IF you received an x-ray today, you will receive an invoice from [facility]. Please contact [facility] at [phone] with questions or concerns regarding your invoice.   IF you received labwork today, you will receive an invoice from [facility]. Please contact [facility] at [phone] with questions or concerns regarding your invoice.   Our billing staff will not be able to assist you with questions regarding bills from these companies.  You will be contacted with the lab results as soon as they are available. The fastest way to get your results is to activate your My Chart account. Instructions are located on the last page of this paperwork. If you have not heard from [place] regarding the results in 2 weeks, please contact this office.     Vivir con ansiedad Living With Anxiety Despus de haber sido diagnosticado con trastorno de ansiedad, podra sentirse aliviado por comprender por qu se haba sentido o haba actuado de cierto modo. Adems, es natural sentirse abrumado por el tratamiento que tiene por delante y por lo que este significar para su vida. Con atencin y [place], [place] trastorno y Marine scientist. Cmo hacer frente a la ansiedad Enfrentar el estrs El estrs es la reaccin del cuerpo ante los cambios y los acontecimientos de la vida, tanto buenos [place]. El estrs puede durar solo algunas horas o puede ser [place]. El estrs puede influir mucho en la ansiedad, por lo que es importante aprender sobre cmo hacerle frente y cmo pensarlo de un modo nuevo. Hable con el mdico o un orientador psicolgico para obtener ms informacin sobre cmo Software engineer. Podran sugerirle algunas tcnicas para hacerlo,  como:  Musicoterapia. Esto podra incluir crear o escuchar msica que disfrute y lo inspire.  Meditacin consciente. Esto implica prestar atencin a la respiracin normal ms que intentar controlarla. Puede realizarse mientras est sentado o camina.  Oracin centrante. Este es un tipo de meditacin que implica centrarse en una palabra, frase o imagen sagrada que le sea representativa y le genere paz.  Respiracin profunda. Para hacer esto, expanda el estmago e inhale lentamente por la nariz. Mantenga el aire durante un lapso de [place]. Luego, exhale lentamente mientras deja que los msculos del estmago se relajen.  Dilogo interno. Se trata de una habilidad por la que usted es capaz de identificar patrones de pensamiento que lo llevan a Best boy reacciones ansiosas y de corregir dichos pensamientos.  Relajacin muscular. Esto implica tensar los msculos y, [place], [place].  Elija una tcnica para reducir el estrs que se adapte a su estilo de vida y su personalidad. Las tcnicas para reducir el estrs llevan tiempo y Location manager. Resrvese de [id number] por da para hAmbulance person Algunos terapeutas pueden ofrecerle capacitacin para aprenderlas. Es posible que algunos planes de seguro mdico cubran la capacitacin. Otras cosas que puede hacer para manejar el estrs:  LCatering managerun registro del estrs. Esto puede ayudarlo a iFinancial plannerestrs y modos de cChief Technology Officersu reaccin.  Pensar en cmo reacciona ante ciertas situaciones. Es posible que no sea capaz de cChief Technology Officertodo, pero puede controlar su reaccin.  Hacerse tiempo para las actividades que lo ayudan a rNurse, children'sy no sentir culpa por pasar su  tiempo de [employer].  La terapia en combinacin con las habilidades para enfrentar y reducir el estrs proporciona la mejor alternativa para un tratamiento satisfactorio. Medicamentos Los medicamentos pueden ayudar a [employer]. Algunos medicamentos para la  ansiedad:  Teacher, adult education ansiedad.  Antidepresivos.  Betabloqueantes.  Es posible que se requieran medicamentos, junto con la terapia, si otros tratamientos no dieron [place]. Un mdico debe recetar los medicamentos. [place] interpersonales pueden ser muy importantes para ayudar a su recuperacin. Intente pasar ms tiempo interactuando con amigos y familiares de [place]. Considere la posibilidad de ir a terapia de pareja, tomar clases de educacin familiar o ir a Careers information officer. La terapia puede ayudarlos a usted y a los dems a comprender mejor el trastorno. Cmo reconocer cambios en el trastorno Todos tienen una respuesta diferente al tratamiento de la ansiedad. Se dice que est recuperado de la ansiedad cuando los sntomas disminuyen y dejan de Cabin crew en las actividades diarias en el hogar o [place]. Esto podra significar que usted comenzar a Field seismologist lo siguiente:  Associate Professor y atencin.  Dormir mejor.  Estar menos irritable.  Tener ms energa.  Tener [employer].  Es Glass blower/designer cundo el trastorno [place]. Comunquese con el mdico si sus sntomas interfieren en su hogar o su trabajo, y usted no siente que el trastorno est mejorando. Dnde encontrar ayuda y 79: Puede conseguir ayuda y [employer] siguientes lugares:  Grupos de [place].  Organizaciones comunitarias y en lnea.  Un lder espiritual de confianza.  Terapia de pareja.  Clases de educacin familiar.  Terapia familiar.  Siga estas instrucciones en su casa:  Consuma una dieta saludable que incluya abundantes frutas, verduras, cereales integrales, productos lcteos descremados y protenas magras. No consuma muchos alimentos con alto contenido de grasas slidas, azcares agregados o sal.  Actividad fsica. La [employer] de los adultos debe hacer lo siguiente: ? Optometrist, al [place], 141mnutos de actividad fsica por semana. El  ejercicio debe aumentar la frecuencia cardaca y hNature conservation officertranspirar (ejercicio de intensidad moderada). ? Realizar ejercicios de fortalecimiento por lo mHalliburton Companypor semana.  Disminuir el consumo de cafena, tabaco, alcohol y otras sustancias potencialmente dainas.  Dormir el tiempo adecuado y de lCabin crew La mState Farmde los adultos necesitan entre [id number] de sueo todas las noches.  Opte por cosas que le simplifiquen la vida.  Tome los medicamentos de venta libre y los recetados solamente como se lo haya indicado el mdico.  Evite el consumo de cafena, alcohol y ciertos medicamentos contra el resfro de venta sin receta. Estos podran hEngineer, building services Pregntele al farmacutico qu medicamentos no debera tomar.  Concurra a todas las visitas de control como se lo haya indicado el mdico. Esto es importante. Preguntas para hacerle al mdico  Me ser til la terapia?  Con qu frecuencia debo visitar a un mdico para el seguimiento?  Durante cunto tiempo tendr qLiberty Global  Tienen efectos secundarios a lAmerican Standard Companiestomo?  Existe una alternativa que remplace los medicamentos? Comunquese con un mdico si:  Le resulta difcil permanecer concentrado o finalizar las tareas diarias.  Pasa muchas horas por da sintindose preocupado por la vida cotidiana.  La preocupacin le provoca un cansancio extremo.  Comienza a tener dolores de cNetherlandso nuseas, o a sentirse tenso.  Orina ms de lo normal.  Tiene diarrea. Solicite ayuda de inmediato si:  Se le acelera la frecuencia cardaca y le  cuesta respirar.  Tiene pensamientos acerca de Runner, broadcasting/film/video o daar a Economist. Si alguna vez siente que puede lastimarse o Physicist, medical a los dems, o tiene pensamientos de poner fin a su vida, busque ayuda de inmediato. Puede dirigirse al servicio de urgencias ms cercano o comunicarse con:  El servicio de Sports administrator de su localidad  (911 en los [place]).  Una lnea de asistencia al suicida y Visual merchandiser en crisis, como la [employer] de Prevencin del Suicidio (National Suicide Prevention Lifeline) al [phone]. Est disponible las 24 horas del da.  Resumen  Tomar medidas para enfrentar el estrs puede calmarlo.  Los medicamentos no pueden curar los trastornos de [place], Biomedical engineer pueden ayudar a [employer].  Los familiares, los amigos y las parejas pueden tener un lugar importante en su recuperacin del trastorno de ansiedad. Esta informacin no tiene Theme park manager el consejo del mdico. Asegrese de hacerle al mdico cualquier pregunta que tenga. Document Released: 06/02/2016 Document Revised: 06/02/2016 Document Reviewed: 06/02/2016 Elsevier Interactive Patient Education  2018 [employer]. Dolor de pecho inespecfico (Nonspecific Chest Pain) Suele ser difcil encontrar la causa del dolor de [place]. Siempre existe una posibilidad de que el dolor est relacionado con algo grave, como un infarto de miocardio o un cogulo sanguneo en los pulmones. Hay muchas enfermedades que no son potencialmente mortales que pueden causar dolor de [place]. Es importante que concurra a las visitas de control con el mdico. CUIDADOS EN EL HOGAR  Si le recetaron antibiticos, asegrese de terminarlos, incluso si comienza a Actor.  Evite las [employer] causen dolor de [place].  No consuma ningn producto que contenga tabaco, lo que incluye cigarrillos, tabaco de Theatre manager o Administrator, Civil Service. Si necesita ayuda para dejar de fumar, consulte al mdico.  No beba alcohol.  Tome los medicamentos solamente como se lo haya indicado el mdico.  Concurra a todas las visitas de control como se lo haya indicado el mdico. Esto es importante. Esto incluye otros estudios si el dolor de pecho no desaparece.  El mdico puede indicarle que mantenga la cabeza levantada (elevada) mientras duerme.  Haga  cambios en su estilo de vida segn las indicaciones del mdico. Estos pueden incluir lo siguiente: ? Practicar actividad fsica con regularidad. Pdale al mdico que le sugiera algunas actividades que sean seguras para usted. ? Consumir una dieta cardiosaludable. El mdico o un especialista en alimentacin (nutricionista) pueden ayudarlo a que haga elecciones saludables. ? Mantener un peso saludable. ? Controlar la diabetes, si es necesario. ? Reducir las situaciones de estrs.  SOLICITE AYUDA SI:  El dolor de pecho no desaparece, incluso despus del tratamiento.  Tiene una erupcin cutnea con ampollas en el pecho.  Tiene fiebre.  SOLICITE AYUDA DE INMEDIATO SI:  El dolor en el pecho es ms intenso.  La tos empeora, o expectora sangre.  Siente un dolor intenso en el vientre (abdomen).  Se siente muy dbil.  Pierde el conocimiento (se desmaya).  Tiene escalofros.  Tiene una molestia repentina e inexplicable en el pecho.  Tiene molestias repentinas e [employer] Corporation, la espalda, el cuello o la [place].  Le falta el aire en cualquier momento.  Comienza a sudar de [place] repentina o la piel se le humedece.  Siente nuseas.  Vomita.  Se siente repentinamente mareado o se desmaya.  Siente que el corazn comienza a latir rpidamente o que se saltea latidos. Estos sntomas pueden Customer service manager. No espere hasta que los sntomas desaparezcan. Solicite atencin  mdica de inmediato. Comunquese con el servicio de emergencias de su localidad (911 en los [place]). No conduzca por sus propios medios [employer]. Esta informacin no tiene Theme park manager el consejo del mdico. Asegrese de hacerle al mdico cualquier pregunta que tenga. Document Released: 05/22/2008 Document Revised: 03/16/2014 Document Reviewed: 09/02/2015 Elsevier Interactive Patient Education  2017 Elsevier Inc.  Hypertension Hypertension, commonly called high blood  pressure, is when the force of blood pumping through the arteries is too strong. The arteries are the blood vessels that carry blood from the heart throughout the body. Hypertension forces the heart to work harder to pump blood and may cause arteries to become narrow or stiff. Having untreated or uncontrolled hypertension can cause heart attacks, strokes, kidney disease, and other problems. A blood pressure reading consists of a higher number over a lower number. Ideally, your blood pressure should be below 120/80. The first ("top") number is called the systolic pressure. It is a measure of the pressure in your arteries as your heart beats. The second ("bottom") number is called the diastolic pressure. It is a measure of the pressure in your arteries as the heart relaxes. What are the causes? The cause of this condition is not known. What increases the risk? Some risk factors for high blood pressure are under your control. Others are not. Factors you can change  Smoking.  Having type 2 diabetes mellitus, high cholesterol, or both.  Not getting enough exercise or physical activity.  Being overweight.  Having too much fat, sugar, calories, or salt (sodium) in your diet.  Drinking too much alcohol. Factors that are difficult or impossible to change  Having chronic kidney disease.  Having a family history of high blood pressure.  Age. Risk increases with age.  Race. You may be at higher risk if you are African-American.  Gender. Men are at higher risk than women before age 86. After age 72, women are at higher risk than men.  Having obstructive sleep apnea.  Stress. What are the signs or symptoms? Extremely high blood pressure (hypertensive crisis) may cause:  Headache.  Anxiety.  Shortness of breath.  Nosebleed.  Nausea and vomiting.  Severe chest pain.  Jerky movements you cannot control (seizures).  How is this diagnosed? This condition is diagnosed by measuring  your blood pressure while you are seated, with your arm resting on a surface. The cuff of the blood pressure monitor will be placed directly against the skin of your upper arm at the level of your heart. It should be measured at least twice using the same arm. Certain conditions can cause a difference in blood pressure between your right and left arms. Certain factors can cause blood pressure readings to be lower or higher than normal (elevated) for a short period of time:  When your blood pressure is higher when you are in a health care provider's office than when you are at home, this is called white coat hypertension. Most people with this condition do not need medicines.  When your blood pressure is higher at home than when you are in a health care provider's office, this is called masked hypertension. Most people with this condition may need medicines to control blood pressure.  If you have a high blood pressure reading during one visit or you have normal blood pressure with other risk factors:  You may be asked to return on a different day to have your blood pressure checked again.  You may be asked to  monitor your blood pressure at home for 1 week or longer.  If you are diagnosed with hypertension, you may have other blood or imaging tests to help your health care provider understand your overall risk for other conditions. How is this treated? This condition is treated by making healthy lifestyle changes, such as eating healthy foods, exercising more, and reducing your alcohol intake. Your health care provider may prescribe medicine if lifestyle changes are not enough to get your blood pressure under control, and if:  Your systolic blood pressure is above 130.  Your diastolic blood pressure is above 80.  Your personal target blood pressure may vary depending on your medical conditions, your age, and other factors. Follow these instructions at home: Eating and drinking  Eat a diet that  is high in fiber and potassium, and low in sodium, added sugar, and fat. An example eating plan is called the DASH (Dietary Approaches to Stop Hypertension) diet. To eat this way: ? Eat plenty of fresh fruits and vegetables. Try to fill half of your plate at each meal with fruits and vegetables. ? Eat whole grains, such as whole wheat pasta, brown rice, or whole grain bread. Fill about one quarter of your plate with whole grains. ? Eat or drink low-fat dairy products, such as skim milk or low-fat yogurt. ? Avoid fatty cuts of meat, processed or cured meats, and poultry with skin. Fill about one quarter of your plate with lean proteins, such as fish, chicken without skin, beans, eggs, and tofu. ? Avoid premade and processed foods. These tend to be higher in sodium, added sugar, and fat.  Reduce your daily sodium intake. Most people with hypertension should eat less than 1,500 mg of sodium a day.  Limit alcohol intake to no more than 1 drink a day for nonpregnant women and 2 drinks a day for men. One drink equals 12 oz of beer, 5 oz of wine, or 1 oz of hard liquor. Lifestyle  Work with your health care provider to maintain a healthy body weight or to lose weight. Ask what an ideal weight is for you.  Get at least 30 minutes of exercise that causes your heart to beat faster (aerobic exercise) most days of the week. Activities may include walking, swimming, or biking.  Include exercise to strengthen your muscles (resistance exercise), such as pilates or lifting weights, as part of your weekly exercise routine. Try to do these types of exercises for 30 minutes at least 3 days a week.  Do not use any products that contain nicotine or tobacco, such as cigarettes and e-cigarettes. If you need help quitting, ask your health care provider.  Monitor your blood pressure at home as told by your health care provider.  Keep all follow-up visits as told by your health care provider. This is  important. Medicines  Take over-the-counter and prescription medicines only as told by your health care provider. Follow directions carefully. Blood pressure medicines must be taken as prescribed.  Do not skip doses of blood pressure medicine. Doing this puts you at risk for problems and can make the medicine less effective.  Ask your health care provider about side effects or reactions to medicines that you should watch for. Contact a health care provider if:  You think you are having a reaction to a medicine you are taking.  You have headaches that keep coming back (recurring).  You feel dizzy.  You have swelling in your ankles.  You have trouble with your  vision. Get help right away if:  You develop a severe headache or confusion.  You have unusual weakness or numbness.  You feel faint.  You have severe pain in your chest or abdomen.  You vomit repeatedly.  You have trouble breathing. Summary  Hypertension is when the force of blood pumping through your arteries is too strong. If this condition is not controlled, it may put you at risk for serious complications.  Your personal target blood pressure may vary depending on your medical conditions, your age, and other factors. For most people, a normal blood pressure is less than 120/80.  Hypertension is treated with lifestyle changes, medicines, or a combination of both. Lifestyle changes include weight loss, eating a healthy, low-sodium diet, exercising more, and limiting alcohol. This information is not intended to replace advice given to you by your health care provider. Make sure you discuss any questions you have with your health care provider. Document Released: 02/23/2005 Document Revised: 01/22/2016 Document Reviewed: 01/22/2016 Elsevier Interactive Patient Education  [employer].

## 2018-02-17 NOTE — Progress Notes (Signed)
BP Readings from Last 3 Encounters:  02/17/18 (!) 178/84  01/03/18 (!) 142/78  12/17/17 128/72   Katie Gardner 62 y.o.   Chief Complaint  Patient presents with  . Hypertension    for the past 4 days bp has been either going very high or dropping very low. Lowest has been 92/**. Feels weak and shakey    HISTORY OF PRESENT ILLNESS: This is a 62 y.o. female complaining of extreme anxiety making her blood pressure ago either too high or too low.  At times feels weak and shaky.  Has felt some chest tightness lasting several seconds with no radiation and no associated symptoms.  Upset with her husband and she thinks this is triggering all her symptoms.  Has no history of angina or coronary artery disease.  Denies syncope, nausea or vomiting, diaphoresis, difficulty breathing.  HPI   [name]   Medication Sig Start Date End Date Taking? Authorizing Provider  cetirizine (ZYRTEC) 10 MG tablet Take 1 tablet (10 mg total) by mouth daily. 12/17/17  Yes Stewart Sasaki, Eilleen Kempf, MD  cyclobenzaprine (FLEXERIL) 5 MG tablet Take 1 tablet (5 mg total) by mouth 3 (three) times daily as needed for muscle spasms. 12/17/17  Yes Ellijah Leffel, Eilleen Kempf, MD  diazepam (VALIUM) 5 MG tablet TAKE 1 TABLET BY MOUTH EVERY DAY AS NEEDED FOR ANXIETY 12/17/17  Yes Clair Alfieri, Eilleen Kempf, MD  lisinopril (PRINIVIL,ZESTRIL) 10 MG tablet Take 1 tablet (10 mg total) by mouth daily. 12/17/17  Yes Georgina Quint, MD  sertraline (ZOLOFT) 100 MG tablet TAKE 1 TABLET(100 MG) BY MOUTH DAILY 12/17/17  Yes Georgina Quint, MD  simvastatin (ZOCOR) 40 MG tablet TAKE 1 TABLET(40 MG) BY MOUTH DAILY 09/16/17  Yes Georgina Quint, MD    Allergies  Allergen Reactions  . Hydrocodone Nausea And Vomiting    dizzy  . Mucinex Dm [Dm-Guaifenesin Er] Swelling    Sob, throat closing    Patient Active Problem List   Diagnosis Date Noted  . Chronic neck pain 12/17/2017  . Essential hypertension  12/17/2017  . Seasonal allergies 12/17/2017  . Tendonitis of elbow, right 04/12/2017  . Right forearm pain 04/12/2017  . Contusion of right chest wall 01/15/2017  . Rib pain 01/15/2017  . Shortness of breath 11/13/2016  . Depression 11/13/2016  . Chronic anxiety 11/13/2016  . Chronic sore throat 11/13/2016  . Acute laryngitis 07/15/2016  . Chest pain   . Hot flashes   . Hematuria   . Allergy   . Anxiety   . Hyperlipidemia     Past Medical History:  Diagnosis Date  . Allergy    seasonal  . Anxiety   . Depression   . Hematuria   . Hot flashes   . Hx of cardiovascular stress test    Lex MV 12/13:  EF 68%, mild apical thinning, no ischemia  . Hyperlipidemia     Past Surgical History:  Procedure Laterality Date  . BREAST LUMPECTOMY     right breast, not cancer  . Carpel tunnel    . ECTOPIC PREGNANCY SURGERY      Social History   Socioeconomic History  . Marital status: Legally Separated    Spouse name: Not on file  . Number of children: 1  . Years of education: 8th grade  . Highest education level: Not on file  Occupational History    Employer: UNEMPLOYED  Social Needs  . Financial resource strain: Not hard at all  . Food insecurity:  Worry: Never true    Inability: Never true  . Transportation needs:    Medical: No    Non-medical: No  Tobacco Use  . Smoking status: Former Games developer  . Smokeless tobacco: Never Used  Substance and Sexual Activity  . Alcohol use: No  . Drug use: No  . Sexual activity: Yes  Lifestyle  . Physical activity:    Days per week: 0 days    Minutes per session: 0 min  . Stress: To some extent  Relationships  . Social connections:    Talks on phone: Once a week    Gets together: Never    Attends religious service: Never    Active member of club or organization: No    Attends meetings of clubs or organizations: Never    Relationship status: Separated  . Intimate partner violence:    Fear of current or ex partner: No     Emotionally abused: No    Physically abused: No    Forced sexual activity: No  Other Topics Concern  . Not on file  Social History Narrative  . Not on file    Family History  Problem Relation Age of Onset  . Heart disease Mother   . Diabetes Mother   . Esophageal cancer Father        43's  . Colon cancer Neg Hx      Review of Systems  Constitutional: Negative.  Negative for chills and fever.  HENT: Negative.  Negative for sore throat.   Eyes: Negative.  Negative for blurred vision and double vision.  Respiratory: Negative.  Negative for cough, hemoptysis and shortness of breath.   Cardiovascular: Positive for chest pain and palpitations. Negative for leg swelling.  Gastrointestinal: Negative.  Negative for abdominal pain, blood in stool, nausea and vomiting.  Genitourinary: Negative.  Negative for dysuria and urgency.  Musculoskeletal: Negative.   Skin: Negative.  Negative for rash.  Neurological: Negative.  Negative for dizziness, loss of consciousness and headaches.  Endo/Heme/Allergies: Negative.    Vitals:   02/17/18 1629  BP: (!) 178/84  Pulse: 75  Temp: 98.5 F (36.9 C)  SpO2: 96%     Physical Exam Vitals signs reviewed.  Constitutional:      Appearance: Normal appearance.  HENT:     Head: Normocephalic and atraumatic.     Nose: Nose normal.     Mouth/Throat:     Mouth: Mucous membranes are moist.     Pharynx: Oropharynx is clear.  Eyes:     Extraocular Movements: Extraocular movements intact.     Conjunctiva/sclera: Conjunctivae normal.     Pupils: Pupils are equal, round, and reactive to light.  Neck:     Musculoskeletal: Normal range of motion and neck supple.  Cardiovascular:     Rate and Rhythm: Normal rate and regular rhythm.     Pulses: Normal pulses.     Heart sounds: Normal heart sounds.  Pulmonary:     Effort: Pulmonary effort is normal.     Breath sounds: Normal breath sounds.  Abdominal:     General: Abdomen is flat. Bowel sounds are  normal. There is no distension.     Tenderness: There is no abdominal tenderness.  Musculoskeletal: Normal range of motion.  Skin:    General: Skin is warm and dry.     Capillary Refill: Capillary refill takes less than 2 seconds.  Neurological:     General: No focal deficit present.     Mental Status: She is  alert and oriented to person, place, and time.     Sensory: No sensory deficit.     Motor: No weakness.     Coordination: Coordination normal.     Gait: Gait normal.  Psychiatric:        Mood and Affect: Mood normal.        Behavior: Behavior normal.   EKG: Normal sinus rhythm with no acute ischemic changes ventricular response 67/min.  Normal EKG.  A total of 40 minutes was spent in the room with the patient, greater than 50% of which was in counseling/coordination of care regarding differential diagnosis, treatment, medications, EKG review, prognosis, and need for follow-up if no better or worse.  Also advised patient about warning signs and symptoms and when to go to the emergency room.  ASSESSMENT & PLAN: Simonne was seen today for hypertension.  Diagnoses and all orders for this visit:  Chest tightness -     EKG 12-Lead  Anxiety state  Essential hypertension  Chronic anxiety -     diazepam (VALIUM) 5 MG tablet; TAKE 1 TABLET BY MOUTH EVERY DAY AS NEEDED FOR ANXIETY    Patient Instructions       If you have lab work done today you will be contacted with your lab results within the next 2 weeks.  If you have not heard from [place] then please contact us. The fastest way to get your results is to register for My Chart.   IF you received an x-ray today, you will receive an invoice from [facility]. Please contact [facility] at [phone] with questions or concerns regarding your invoice.   IF you received labwork today, you will receive an invoice from [place]. Please contact [facility] at [phone] with questions or concerns regarding your  invoice.   Our billing staff will not be able to assist you with questions regarding bills from these companies.  You will be contacted with the lab results as soon as they are available. The fastest way to get your results is to activate your My Chart account. Instructions are located on the last page of this paperwork. If you have not heard from [place] regarding the results in 2 weeks, please contact this office.     Vivir con ansiedad Living With Anxiety Despus de haber sido diagnosticado con trastorno de ansiedad, podra sentirse aliviado por comprender por qu se haba sentido o haba actuado de cierto modo. Adems, es natural sentirse abrumado por el tratamiento que tiene por delante y por lo que este significar para su vida. Con atencin y [place], [place] trastorno y Sales executive. Cmo hacer frente a la ansiedad Enfrentar el estrs El estrs es la reaccin del cuerpo ante los cambios y los acontecimientos de la vida, tanto buenos [place]. El estrs puede durar solo algunas horas o puede ser [place]. El estrs puede influir mucho en la ansiedad, por lo que es importante aprender sobre cmo hacerle frente y cmo pensarlo de un modo nuevo. Hable con el mdico o un orientador psicolgico para obtener ms informacin sobre cmo Museum/gallery exhibitions officer. Podran sugerirle algunas tcnicas para hacerlo, como:  Musicoterapia. Esto podra incluir crear o escuchar msica que disfrute y lo inspire.  Meditacin consciente. Esto implica prestar atencin a la respiracin normal ms que intentar controlarla. Puede realizarse mientras est sentado o camina.  Oracin centrante. Este es un tipo de meditacin que implica centrarse en una palabra, frase o imagen sagrada que le sea representativa y le genere paz.  Respiracin profunda. Para hacer esto, expanda el estmago e inhale lentamente por la nariz. Mantenga el aire durante un lapso de [place]. Luego, exhale lentamente mientras deja  que los msculos del estmago se relajen.  Dilogo interno. Se trata de una habilidad por la que usted es capaz de identificar patrones de pensamiento que lo llevan a Warehouse manager reacciones ansiosas y de corregir dichos pensamientos.  Relajacin muscular. Esto implica tensar los msculos y, [place].  Elija una tcnica para reducir el estrs que se adapte a su estilo de vida y su personalidad. Las tcnicas para reducir el estrs llevan tiempo y Multimedia programmer. Resrvese de [id number] por da para Associate Professor. Algunos terapeutas pueden ofrecerle capacitacin para aprenderlas. Es posible que algunos planes de seguro mdico cubran la capacitacin. Otras cosas que puede hacer para manejar el estrs:  Midwife un registro del estrs. Esto puede ayudarlo a Control and instrumentation engineer estrs y modos de Chief Operating Officer su reaccin.  Pensar en cmo reacciona ante ciertas situaciones. Es posible que no sea capaz de Chief Operating Officer todo, pero puede controlar su reaccin.  Hacerse tiempo para las actividades que lo ayudan a Lexicographer y no sentir culpa por pasar su tiempo de [place].  La terapia en combinacin con las habilidades para enfrentar y reducir el estrs proporciona la mejor alternativa para un tratamiento satisfactorio. Medicamentos Los medicamentos pueden ayudar a [employer]. Algunos medicamentos para la ansiedad:  Programme researcher, broadcasting/film/video ansiedad.  Antidepresivos.  Betabloqueantes.  Es posible que se requieran medicamentos, junto con la terapia, si otros tratamientos no dieron [place]. Un mdico debe recetar los medicamentos. Las [employer] relaciones interpersonales pueden ser muy importantes para ayudar a su recuperacin. Intente pasar ms tiempo interactuando con amigos y familiares de [place]. Considere la posibilidad de ir a terapia de pareja, tomar clases de educacin familiar o ir a Information systems manager. La terapia puede ayudarlos a usted y a los dems a comprender mejor el  trastorno. Cmo reconocer cambios en el trastorno Todos tienen una respuesta diferente al tratamiento de la ansiedad. Se dice que est recuperado de la ansiedad cuando los sntomas disminuyen y dejan de Producer, television/film/video en las actividades diarias en el hogar o [place]. Esto podra significar que usted comenzar a Radio producer lo siguiente:  Dealer y atencin.  Dormir mejor.  Estar menos irritable.  Tener ms energa.  Tener [employer].  Es Public librarian cundo el trastorno [place]. Comunquese con el mdico si sus sntomas interfieren en su hogar o su trabajo, y usted no siente que el trastorno est mejorando. Dnde encontrar ayuda y apoyo: Puede conseguir ayuda y [employer] siguientes lugares:  Grupos de [place].  Organizaciones comunitarias y en lnea.  Un lder espiritual de confianza.  Terapia de pareja.  Clases de educacin familiar.  Terapia familiar.  Siga estas instrucciones en su casa:  Consuma una dieta saludable que incluya abundantes frutas, verduras, cereales integrales, productos lcteos descremados y protenas magras. No consuma muchos alimentos con alto contenido de grasas slidas, azcares agregados o sal.  Actividad fsica. La [employer]ley[employer]son de los adultos debe hacer lo siguiente: ? Education officer, environmental, al [place], de actividad fsica por semana. El ejercicio debe aumentar la frecuencia cardaca y Media planner transpirar (ejercicio de intensidad moderada). ? Realizar ejercicios de fortalecimiento por lo [employer] por semana.  Disminuir el consumo de cafena, tabaco, alcohol y otras sustancias potencialmente dainas.  Dormir el tiempo adecuado y de Network engineer. La Harley[employer]son de los adultos necesitan entre [id number] de  sueo todas las noches.  Opte por cosas que le simplifiquen la vida.  Tome los medicamentos de venta libre y los recetados solamente como se lo haya indicado el mdico.  Evite el consumo de cafena, alcohol y ciertos  medicamentos contra el resfro de venta sin receta. Estos podran Optician, dispensinghacerlo sentir peor. Pregntele al farmacutico qu medicamentos no debera tomar.  Concurra a todas las visitas de control como se lo haya indicado el mdico. Esto es importante. Preguntas para hacerle al mdico  Me ser til la terapia?  Con qu frecuencia debo visitar a un mdico para el seguimiento?  Durante cunto tiempo tendr TXU Corpque tomar los medicamentos?  Tienen efectos secundarios a TRW Automotivelargo plazo los medicamentos que tomo?  Existe una alternativa que remplace los medicamentos? Comunquese con un mdico si:  Le resulta difcil permanecer concentrado o finalizar las tareas diarias.  Pasa muchas horas por da sintindose preocupado por la vida cotidiana.  La preocupacin le provoca un cansancio extremo.  Comienza a tener dolores de Turkmenistancabeza o nuseas, o a sentirse tenso.  Orina ms de lo normal.  Tiene diarrea. Solicite ayuda de inmediato si:  Se le acelera la frecuencia cardaca y Games developerle cuesta respirar.  Tiene pensamientos acerca de Runner, broadcasting/film/videolastimarse o daar a Economistotras personas. Si alguna vez siente que puede lastimarse o Physicist, medicallastimar a los dems, o tiene pensamientos de poner fin a su vida, busque ayuda de inmediato. Puede dirigirse al servicio de urgencias ms cercano o comunicarse con:  El servicio de Sports administratoremergencias de su localidad (911 en los [place]).  Una lnea de asistencia al suicida y Visual merchandiseratencin en crisis, como la Murphy OilLnea Nacional de Prevencin del Suicidio (National Suicide Prevention Lifeline) al [phone]. Est disponible las 24 horas del da.  Resumen  Tomar medidas para enfrentar el estrs puede calmarlo.  Los medicamentos no pueden curar los trastornos de [place]ansiedad, Biomedical engineerpero pueden ayudar a Asbury Automotive Groupaliviar los sntomas.  Los familiares, los amigos y las parejas pueden tener un lugar importante en su recuperacin del trastorno de ansiedad. Esta informacin no tiene Theme park managercomo fin reemplazar el consejo del mdico.  Asegrese de hacerle al mdico cualquier pregunta que tenga. Document Released: 06/02/2016 Document Revised: 06/02/2016 Document Reviewed: 06/02/2016 Elsevier Interactive Patient Education  2018 Arvin[employer]vier Inc. Dolor de pecho inespecfico (Nonspecific Chest Pain) Suele ser difcil encontrar la causa del dolor de [place]. Siempre existe una posibilidad de que el dolor est relacionado con algo grave, como un infarto de miocardio o un cogulo sanguneo en los pulmones. Hay muchas enfermedades que no son potencialmente mortales que pueden causar dolor de [place]. Es importante que concurra a las visitas de control con el mdico. CUIDADOS EN EL HOGAR  Si le recetaron antibiticos, asegrese de terminarlos, incluso si comienza a Actorsentirse mejor.  Evite las SUPERVALU INCactividades que le causen dolor de [place].  No consuma ningn producto que contenga tabaco, lo que incluye cigarrillos, tabaco de Theatre managermascar o Administrator, Civil Servicecigarrillos electrnicos. Si necesita ayuda para dejar de fumar, consulte al mdico.  No beba alcohol.  Tome los medicamentos solamente como se lo haya indicado el mdico.  Concurra a todas las visitas de control como se lo haya indicado el mdico. Esto es importante. Esto incluye otros estudios si el dolor de pecho no desaparece.  El mdico puede indicarle que mantenga la cabeza levantada (elevada) mientras duerme.  Haga cambios en su estilo de vida segn las indicaciones del mdico. Estos pueden incluir lo siguiente: ? Practicar actividad fsica con regularidad. [place]dale al mdico que le sugiera algunas actividades que sean seguras  para usted. ? Consumir una dieta cardiosaludable. El mdico o un especialista en alimentacin (nutricionista) pueden ayudarlo a que haga elecciones saludables. ? Mantener un peso saludable. ? Controlar la diabetes, si es necesario. ? Reducir las situaciones de estrs.  SOLICITE AYUDA SI:  El dolor de pecho no desaparece, incluso despus del tratamiento.  Tiene una erupcin  cutnea con ampollas en el pecho.  Tiene fiebre.  SOLICITE AYUDA DE INMEDIATO SI:  El dolor en el pecho es ms intenso.  La tos empeora, o expectora sangre.  Siente un dolor intenso en el vientre (abdomen).  Se siente muy dbil.  Pierde el conocimiento (se desmaya).  Tiene escalofros.  Tiene una molestia repentina e inexplicable en el pecho.  Tiene molestias repentinas e [employer]xon [employer] Corporation, la espalda, el cuello o la [place].  Le falta el aire en cualquier momento.  Comienza a sudar de [place] repentina o la piel se le humedece.  Siente nuseas.  Vomita.  Se siente repentinamente mareado o se desmaya.  Siente que el corazn comienza a latir rpidamente o que se saltea latidos. Estos sntomas pueden Customer service manager. No espere hasta que los sntomas desaparezcan. Solicite atencin mdica de inmediato. Comunquese con el servicio de emergencias de su localidad (911 en los [place]). No conduzca por sus propios medios [employer]. Esta informacin no tiene Theme park manager el consejo del mdico. Asegrese de hacerle al mdico cualquier pregunta que tenga. Document Released: 05/22/2008 Document Revised: 03/16/2014 Document Reviewed: 09/02/2015 Elsevier Interactive Patient Education  2017 Elsevier Inc.  Hypertension Hypertension, commonly called high blood pressure, is when the force of blood pumping through the arteries is too strong. The arteries are the blood vessels that carry blood from the heart throughout the body. Hypertension forces the heart to work harder to pump blood and may cause arteries to become narrow or stiff. Having untreated or uncontrolled hypertension can cause heart attacks, strokes, kidney disease, and other problems. A blood pressure reading consists of a higher number over a lower number. Ideally, your blood pressure should be below 120/80. The first ("top") number is called the systolic pressure. It is a measure of the  pressure in your arteries as your heart beats. The second ("bottom") number is called the diastolic pressure. It is a measure of the pressure in your arteries as the heart relaxes. What are the causes? The cause of this condition is not known. What increases the risk? Some risk factors for high blood pressure are under your control. Others are not. Factors you can change  Smoking.  Having type 2 diabetes mellitus, high cholesterol, or both.  Not getting enough exercise or physical activity.  Being overweight.  Having too much fat, sugar, calories, or salt (sodium) in your diet.  Drinking too much alcohol. Factors that are difficult or impossible to change  Having chronic kidney disease.  Having a family history of high blood pressure.  Age. Risk increases with age.  Race. You may be at higher risk if you are African-American.  Gender. Men are at higher risk than women before age 50. After age 63, women are at higher risk than men.  Having obstructive sleep apnea.  Stress. What are the signs or symptoms? Extremely high blood pressure (hypertensive crisis) may cause:  Headache.  Anxiety.  Shortness of breath.  Nosebleed.  Nausea and vomiting.  Severe chest pain.  Jerky movements you cannot control (seizures).  How is this diagnosed? This condition is diagnosed by measuring your blood  pressure while you are seated, with your arm resting on a surface. The cuff of the blood pressure monitor will be placed directly against the skin of your upper arm at the level of your heart. It should be measured at least twice using the same arm. Certain conditions can cause a difference in blood pressure between your right and left arms. Certain factors can cause blood pressure readings to be lower or higher than normal (elevated) for a short period of time:  When your blood pressure is higher when you are in a health care provider's office than when you are at home, this is called  white coat hypertension. Most people with this condition do not need medicines.  When your blood pressure is higher at home than when you are in a health care provider's office, this is called masked hypertension. Most people with this condition may need medicines to control blood pressure.  If you have a high blood pressure reading during one visit or you have normal blood pressure with other risk factors:  You may be asked to return on a different day to have your blood pressure checked again.  You may be asked to monitor your blood pressure at home for 1 week or longer.  If you are diagnosed with hypertension, you may have other blood or imaging tests to help your health care provider understand your overall risk for other conditions. How is this treated? This condition is treated by making healthy lifestyle changes, such as eating healthy foods, exercising more, and reducing your alcohol intake. Your health care provider may prescribe medicine if lifestyle changes are not enough to get your blood pressure under control, and if:  Your systolic blood pressure is above 130.  Your diastolic blood pressure is above 80.  Your personal target blood pressure may vary depending on your medical conditions, your age, and other factors. Follow these instructions at home: Eating and drinking  Eat a diet that is high in fiber and potassium, and low in sodium, added sugar, and fat. An example eating plan is called the DASH (Dietary Approaches to Stop Hypertension) diet. To eat this way: ? Eat plenty of fresh fruits and vegetables. Try to fill half of your plate at each meal with fruits and vegetables. ? Eat whole grains, such as whole wheat pasta, brown rice, or whole grain bread. Fill about one quarter of your plate with whole grains. ? Eat or drink low-fat dairy products, such as skim milk or low-fat yogurt. ? Avoid fatty cuts of meat, processed or cured meats, and poultry with skin. Fill about one  quarter of your plate with lean proteins, such as fish, chicken without skin, beans, eggs, and tofu. ? Avoid premade and processed foods. These tend to be higher in sodium, added sugar, and fat.  Reduce your daily sodium intake. Most people with hypertension should eat less than 1,500 mg of sodium a day.  Limit alcohol intake to no more than 1 drink a day for nonpregnant women and 2 drinks a day for men. One drink equals 12 oz of beer, 5 oz of wine, or 1 oz of hard liquor. Lifestyle  Work with your health care provider to maintain a healthy body weight or to lose weight. Ask what an ideal weight is for you.  Get at least 30 minutes of exercise that causes your heart to beat faster (aerobic exercise) most days of the week. Activities may include walking, swimming, or biking.  Include exercise to strengthen  your muscles (resistance exercise), such as pilates or lifting weights, as part of your weekly exercise routine. Try to do these types of exercises for 30 minutes at least 3 days a week.  Do not use any products that contain nicotine or tobacco, such as cigarettes and e-cigarettes. If you need help quitting, ask your health care provider.  Monitor your blood pressure at home as told by your health care provider.  Keep all follow-up visits as told by your health care provider. This is important. Medicines  Take over-the-counter and prescription medicines only as told by your health care provider. Follow directions carefully. Blood pressure medicines must be taken as prescribed.  Do not skip doses of blood pressure medicine. Doing this puts you at risk for problems and can make the medicine less effective.  Ask your health care provider about side effects or reactions to medicines that you should watch for. Contact a health care provider if:  You think you are having a reaction to a medicine you are taking.  You have headaches that keep coming back (recurring).  You feel dizzy.  You  have swelling in your ankles.  You have trouble with your vision. Get help right away if:  You develop a severe headache or confusion.  You have unusual weakness or numbness.  You feel faint.  You have severe pain in your chest or abdomen.  You vomit repeatedly.  You have trouble breathing. Summary  Hypertension is when the force of blood pumping through your arteries is too strong. If this condition is not controlled, it may put you at risk for serious complications.  Your personal target blood pressure may vary depending on your medical conditions, your age, and other factors. For most people, a normal blood pressure is less than 120/80.  Hypertension is treated with lifestyle changes, medicines, or a combination of both. Lifestyle changes include weight loss, eating a healthy, low-sodium diet, exercising more, and limiting alcohol. This information is not intended to replace advice given to you by your health care provider. Make sure you discuss any questions you have with your health care provider. Document Released: 02/23/2005 Document Revised: 01/22/2016 Document Reviewed: 01/22/2016 Elsevier Interactive Patient Education  2018 Elsevier Inc.      Edwina Barth, MD [facility]

## 2018-02-18 ENCOUNTER — Encounter: Payer: Self-pay | Admitting: Emergency Medicine

## 2018-02-18 ENCOUNTER — Telehealth: Payer: Self-pay | Admitting: Emergency Medicine

## 2018-02-18 NOTE — Telephone Encounter (Signed)
Copied from CRM [id number]. Topic: Quick Communication - See Telephone Encounter >> Feb 18, 2018  4:47 PM Arlyss Gandyichardson, Connor Meacham N, NT wrote: CRM for notification. See Telephone encounter for: 02/18/18. Pt states that she is needing a stronger dosage for her diazepam (VALIUM) 5 MG tablet. She states the 5 MG does not help her.

## 2018-02-21 NOTE — Telephone Encounter (Signed)
She is taking a pretty good dose.  Does not need an increased dose and she may be taking more than she should.  She may need a different type of medication if anxiety becomes a chronic problem.  Thanks.

## 2018-03-24 ENCOUNTER — Other Ambulatory Visit: Payer: Self-pay | Admitting: Emergency Medicine

## 2018-03-24 DIAGNOSIS — F419 Anxiety disorder, unspecified: Secondary | ICD-10-CM

## 2018-03-24 NOTE — Telephone Encounter (Signed)
Requested medication (s) are due for refill today: Yes  Requested medication (s) are on the active medication list: Yes  Last refill:  02/17/18  Future visit scheduled: Yes  Notes to clinic:  Unable to refill per protocol     Requested Prescriptions  Pending Prescriptions Disp Refills   diazepam (VALIUM) 5 MG tablet 30 tablet 0    Sig: TAKE 1 TABLET BY MOUTH EVERY DAY AS NEEDED FOR ANXIETY     Not Delegated - Psychiatry:  Anxiolytics/Hypnotics Failed - 03/24/2018  2:04 PM      Failed - This refill cannot be delegated      Failed - Urine Drug Screen completed in last 360 days.      Failed - Valid encounter within last 6 months    Recent Outpatient Visits          1 month ago Chest tightness   [facility] at [facility], Eilleen Kempf, MD   2 months ago Palpitations   [facility], Meda Coffee, MD   3 months ago Essential hypertension   [facility] at [facility], Eilleen Kempf, MD   5 months ago Seasonal allergies   [facility], Meda Coffee, MD   5 months ago Seasonal allergies   Primary [facility] at [facility], Gerald Stabs, PA-C      Future Appointments            In 2 months Sagardia, Eilleen Kempf, MD Primary Care at [place], [facility]

## 2018-03-24 NOTE — Telephone Encounter (Signed)
Copied from CRM [id number]. Topic: Quick Communication - Rx Refill/Question >> Mar 24, 2018  1:51 PM Gaynelle Adu wrote: Medication: diazepam (VALIUM) 5 MG tablet  Has the patient contacted their pharmacy? no   Preferred Pharmacy (with phone number or street name): [id number] G[place] Otto, [place] - [address] AT [facility] OF [facility] & [address] [phone] (Phone) [phone] (Fax)    Agent: Please be advised that RX refills may take up to 3 business days. We ask that you follow-up with your pharmacy.

## 2018-03-25 NOTE — Telephone Encounter (Signed)
Please deny all medications or approve all medications before sending to scheduling pool to make an appointment. We cannot close the encounter until the orders are signed

## 2018-03-25 NOTE — Telephone Encounter (Signed)
Please Advise  Patient is requesting a refill of the following medications: Requested Prescriptions   Pending Prescriptions Disp Refills  . diazepam (VALIUM) 5 MG tablet 30 tablet 0    Sig: TAKE 1 TABLET BY MOUTH EVERY DAY AS NEEDED FOR ANXIETY

## 2018-03-28 ENCOUNTER — Telehealth: Payer: Self-pay | Admitting: Emergency Medicine

## 2018-03-28 NOTE — Telephone Encounter (Signed)
LVM for patient to call back and make an appointment in order to get her medication refill.

## 2018-04-22 ENCOUNTER — Other Ambulatory Visit: Payer: Self-pay

## 2018-04-22 ENCOUNTER — Encounter: Payer: Self-pay | Admitting: Emergency Medicine

## 2018-04-22 ENCOUNTER — Ambulatory Visit (INDEPENDENT_AMBULATORY_CARE_PROVIDER_SITE_OTHER): Payer: 59 | Admitting: Emergency Medicine

## 2018-04-22 VITALS — BP 131/67 | HR 72 | Temp 97.9°F | Resp 12 | Ht 60.0 in | Wt 137.4 lb

## 2018-04-22 DIAGNOSIS — M25519 Pain in unspecified shoulder: Secondary | ICD-10-CM | POA: Diagnosis not present

## 2018-04-22 DIAGNOSIS — F411 Generalized anxiety disorder: Secondary | ICD-10-CM | POA: Diagnosis not present

## 2018-04-22 DIAGNOSIS — I10 Essential (primary) hypertension: Secondary | ICD-10-CM | POA: Diagnosis not present

## 2018-04-22 DIAGNOSIS — F419 Anxiety disorder, unspecified: Secondary | ICD-10-CM | POA: Diagnosis not present

## 2018-04-22 MED ORDER — IBUPROFEN 600 MG PO TABS: 600.0000 mg | ORAL_TABLET | Freq: Three times a day (TID) | ORAL | 0 refills | Status: DC | PRN

## 2018-04-22 MED ORDER — LISINOPRIL 10 MG PO TABS: 10.0000 mg | ORAL_TABLET | Freq: Every day | ORAL | 3 refills | Status: DC

## 2018-04-22 MED ORDER — SIMVASTATIN 40 MG PO TABS: ORAL_TABLET | ORAL | 3 refills | Status: DC

## 2018-04-22 MED ORDER — DIAZEPAM 5 MG PO TABS: ORAL_TABLET | ORAL | 0 refills | Status: DC

## 2018-04-22 MED ORDER — CYCLOBENZAPRINE HCL 5 MG PO TABS: 5.0000 mg | ORAL_TABLET | Freq: Three times a day (TID) | ORAL | 1 refills | Status: DC | PRN

## 2018-04-22 NOTE — Patient Instructions (Addendum)
   If you have lab work done today you will be contacted with your lab results within the next 2 weeks.  If you have not heard from us then please contact us. The fastest way to get your results is to register for My Chart.   IF you received an x-ray today, you will receive an invoice from [facility]. Please contact [facility] at [phone] with questions or concerns regarding your invoice.   IF you received labwork today, you will receive an invoice from [facility]. Please contact [facility] at [phone] with questions or concerns regarding your invoice.   Our billing staff will not be able to assist you with questions regarding bills from these companies.  You will be contacted with the lab results as soon as they are available. The fastest way to get your results is to activate your My Chart account. Instructions are located on the last page of this paperwork. If you have not heard from us regarding the results in 2 weeks, please contact this office.       Shoulder Pain Many things can cause shoulder pain, including:  An injury.  Moving the shoulder in the same way again and again (overuse).  Joint pain (arthritis). Pain can come from:  Swelling and irritation (inflammation) of any part of the shoulder.  An injury to the shoulder joint.  An injury to: ? Tissues that connect muscle to bone (tendons). ? Tissues that connect bones to each other (ligaments). ? Bones. Follow these instructions at home: Watch for changes in your symptoms. Let your doctor know about them. Follow these instructions to help with your pain. If you have a sling:  Wear the sling as told by your doctor. Remove it only as told by your doctor.  Loosen the sling if your fingers: ? Tingle. ? Become numb. ? Turn cold and blue.  Keep the sling clean.  If the sling is not waterproof: ? Do not let it get wet. ? Take the sling off when you shower or bathe. Managing pain,  stiffness, and swelling   If told, put ice on the painful area: ? Put ice in a plastic bag. ? Place a towel between your skin and the bag. ? Leave the ice on for 20 minutes, 2-3 times a day. Stop putting ice on if it does not help with the pain.  Squeeze a soft ball or a foam pad as much as possible. This prevents swelling in the shoulder. It also helps to strengthen the arm. General instructions  Take over-the-counter and prescription medicines only as told by your doctor.  Keep all follow-up visits as told by your doctor. This is important. Contact a doctor if:  Your pain gets worse.  Medicine does not help your pain.  You have new pain in your arm, hand, or fingers. Get help right away if:  Your arm, hand, or fingers: ? Tingle. ? Are numb. ? Are swollen. ? Are painful. ? Turn white or blue. Summary  Shoulder pain can be caused by many things. These include injury, moving the shoulder in the same away again and again, and joint pain.  Watch for changes in your symptoms. Let your doctor know about them.  This condition may be treated with a sling, ice, and pain medicine.  Contact your doctor if the pain gets worse or you have new pain. Get help right away if your arm, hand, or fingers tingle or get numb, swollen, or painful.  Keep all   follow-up visits as told by your doctor. This is important. This information is not intended to replace advice given to you by your health care provider. Make sure you discuss any questions you have with your health care provider. Document Released: 08/12/2007 Document Revised: 09/07/2017 Document Reviewed: 09/07/2017 Elsevier Interactive Patient Education  2019 Elsevier Inc.  

## 2018-04-22 NOTE — Progress Notes (Signed)
Katie Gardner 63 y.o.   Chief Complaint  Patient presents with  . Shoulder Pain    right shoulder need refill on ibuprophen 600 mg and flexeril  . Anxiety    was last seen for 02/17/18 need  something alitle stronger, currently taking diazepam 5 mg, patient stated you all talked about it on last visit but did not increase or give anything stronger.  . Hypertension    need refill on lisinopril    HISTORY OF PRESENT ILLNESS: This is a 63 y.o. female with several complaints: 1.  Right shoulder pain for 2 weeks secondary to minor injury.  No direct trauma or falls.  Has good range of motion but shoulder hurts. 2.  Has a history of chronic anxiety since she was a teenager.  Takes Valium as needed.  States she does not take it every day.  Presently taking Zoloft 100 mg daily.  Has not seen a psychiatrist in the past 8 years.  Will request evaluation today. 3.  History of hypertension.  Needs refill of lisinopril and simvastatin.  HPI   [name]   Medication Sig Start Date End Date Taking? Authorizing Provider  cetirizine (ZYRTEC) 10 MG tablet Take 1 tablet (10 mg total) by mouth daily. 12/17/17  Yes Yehoshua Vitelli, Eilleen KempfMiguel Jose, MD  cyclobenzaprine (FLEXERIL) 5 MG tablet Take 1 tablet (5 mg total) by mouth 3 (three) times daily as needed for muscle spasms. 12/17/17  Yes Kalayah Leske, Eilleen KempfMiguel Jose, MD  diazepam (VALIUM) 5 MG tablet TAKE 1 TABLET BY MOUTH EVERY DAY AS NEEDED FOR ANXIETY 02/17/18  Yes Oluwadarasimi Redmon, Eilleen KempfMiguel Jose, MD  lisinopril (PRINIVIL,ZESTRIL) 10 MG tablet Take 1 tablet (10 mg total) by mouth daily. 12/17/17  Yes Georgina QuintSagardia, Fredi Hurtado Jose, MD  sertraline (ZOLOFT) 100 MG tablet TAKE 1 TABLET(100 MG) BY MOUTH DAILY 12/17/17  Yes Georgina QuintSagardia, Daphnee Preiss Jose, MD  simvastatin (ZOCOR) 40 MG tablet TAKE 1 TABLET(40 MG) BY MOUTH DAILY 09/16/17  Yes Georgina QuintSagardia, Davan Nawabi Jose, MD    Allergies  Allergen Reactions  . Hydrocodone Nausea And Vomiting    dizzy  . Mucinex Dm [Dm-Guaifenesin Er]  Swelling    Sob, throat closing    Patient Active Problem List   Diagnosis Date Noted  . Chronic neck pain 12/17/2017  . Essential hypertension 12/17/2017  . Seasonal allergies 12/17/2017  . Tendonitis of elbow, right 04/12/2017  . Right forearm pain 04/12/2017  . Contusion of right chest wall 01/15/2017  . Rib pain 01/15/2017  . Shortness of breath 11/13/2016  . Depression 11/13/2016  . Chronic anxiety 11/13/2016  . Chronic sore throat 11/13/2016  . Acute laryngitis 07/15/2016  . Chest pain   . Hot flashes   . Hematuria   . Allergy   . Anxiety   . Hyperlipidemia     Past Medical History:  Diagnosis Date  . Allergy    seasonal  . Anxiety   . Depression   . Hematuria   . Hot flashes   . Hx of cardiovascular stress test    Lex MV 12/13:  EF 68%, mild apical thinning, no ischemia  . Hyperlipidemia     Past Surgical History:  Procedure Laterality Date  . BREAST LUMPECTOMY     right breast, not cancer  . Carpel tunnel    . ECTOPIC PREGNANCY SURGERY      Social History   Socioeconomic History  . Marital status: Legally Separated    Spouse name: Not on file  . Number of children: 1  .  Years of education: 8th grade  . Highest education level: Not on file  Occupational History    Employer: UNEMPLOYED  Social Needs  . Financial resource strain: Not hard at all  . Food insecurity:    Worry: Never true    Inability: Never true  . Transportation needs:    Medical: No    Non-medical: No  Tobacco Use  . Smoking status: Former Games developer  . Smokeless tobacco: Never Used  Substance and Sexual Activity  . Alcohol use: No  . Drug use: No  . Sexual activity: Yes  Lifestyle  . Physical activity:    Days per week: 0 days    Minutes per session: 0 min  . Stress: To some extent  Relationships  . Social connections:    Talks on phone: Once a week    Gets together: Never    Attends religious service: Never    Active member of club or organization: No    Attends  meetings of clubs or organizations: Never    Relationship status: Separated  . Intimate partner violence:    Fear of current or ex partner: No    Emotionally abused: No    Physically abused: No    Forced sexual activity: No  Other Topics Concern  . Not on file  Social History Narrative  . Not on file    Family History  Problem Relation Age of Onset  . Heart disease Mother   . Diabetes Mother   . Esophageal cancer Father        71's  . Colon cancer Neg Hx      Review of Systems  Constitutional: Negative.   HENT: Negative.   Eyes: Negative for blurred vision and double vision.  Respiratory: Negative.  Negative for cough and shortness of breath.   Cardiovascular: Negative.  Negative for chest pain and palpitations.  Gastrointestinal: Negative.  Negative for abdominal pain, nausea and vomiting.  Genitourinary: Negative.   Musculoskeletal: Positive for joint pain (Right shoulder).  Skin: Negative.   Neurological: Negative for dizziness and headaches.  Psychiatric/Behavioral: The patient is nervous/anxious.   All other systems reviewed and are negative.   Vitals:   04/22/18 1133  BP: 131/67  Pulse: 72  Resp: 12  Temp: 97.9 F (36.6 C)  SpO2: 98%    Physical Exam Vitals signs reviewed.  Constitutional:      Appearance: Normal appearance.  HENT:     Head: Normocephalic and atraumatic.     Mouth/Throat:     Mouth: Mucous membranes are moist.     Pharynx: Oropharynx is clear.  Neck:     Musculoskeletal: Normal range of motion and neck supple.  Cardiovascular:     Rate and Rhythm: Normal rate and regular rhythm.     Heart sounds: Normal heart sounds.  Pulmonary:     Breath sounds: Normal breath sounds.  Musculoskeletal:     Comments: Right shoulder: No swelling.  No tenderness.  Limited range of motion due to pain. Right upper extremity: Within normal limits.  Skin:    General: Skin is warm.     Capillary Refill: Capillary refill takes less than 2 seconds.    Neurological:     General: No focal deficit present.     Mental Status: She is alert and oriented to person, place, and time.    A total of 25 minutes was spent in the room with the patient, greater than 50% of which was in counseling/coordination of care regarding chronic  medical problems and acute shoulder problem, treatment and management, medications and side effects, need for evaluation by psychiatrist for chronic anxiety and follow-up.   ASSESSMENT & PLAN: [name] was seen today for shoulder pain, anxiety and hypertension.  Diagnoses and all orders for this visit:  Arthralgia of shoulder, unspecified laterality -     cyclobenzaprine (FLEXERIL) 5 MG tablet; Take 1 tablet (5 mg total) by mouth 3 (three) times daily as needed for muscle spasms. -     ibuprofen (ADVIL,MOTRIN) 600 MG tablet; Take 1 tablet (600 mg total) by mouth every 8 (eight) hours as needed for moderate pain.  Chronic anxiety -     diazepam (VALIUM) 5 MG tablet; TAKE 1 TABLET BY MOUTH EVERY DAY AS NEEDED FOR ANXIETY  Essential hypertension -     simvastatin (ZOCOR) 40 MG tablet; TAKE 1 TABLET(40 MG) BY MOUTH DAILY -     lisinopril (PRINIVIL,ZESTRIL) 10 MG tablet; Take 1 tablet (10 mg total) by mouth daily.  Generalized anxiety disorder -     Ambulatory referral to Psychiatry    Patient Instructions       If you have lab work done today you will be contacted with your lab results within the next 2 weeks.  If you have not heard from us then please contact us. The fastest way to get your results is to register for My Chart.   IF you received an x-ray today, you will receive an invoice from [facility]. Please contact [facility] at [phone] with questions or concerns regarding your invoice.   IF you received labwork today, you will receive an invoice from [facility]. Please contact [facility] at [phone] with questions or concerns regarding your invoice.   Our billing staff will not  be able to assist you with questions regarding bills from these companies.  You will be contacted with the lab results as soon as they are available. The fastest way to get your results is to activate your My Chart account. Instructions are located on the last page of this paperwork. If you have not heard from us regarding the results in 2 weeks, please contact this office.      Shoulder Pain Many things can cause shoulder pain, including:  An injury.  Moving the shoulder in the same way again and again (overuse).  Joint pain (arthritis). Pain can come from:  Swelling and irritation (inflammation) of any part of the shoulder.  An injury to the shoulder joint.  An injury to: ? Tissues that connect muscle to bone (tendons). ? Tissues that connect bones to each other (ligaments). ? Bones. Follow these instructions at home: Watch for changes in your symptoms. Let your doctor know about them. Follow these instructions to help with your pain. If you have a sling:  Wear the sling as told by your doctor. Remove it only as told by your doctor.  Loosen the sling if your fingers: ? Tingle. ? Become numb. ? Turn cold and blue.  Keep the sling clean.  If the sling is not waterproof: ? Do not let it get wet. ? Take the sling off when you shower or bathe. Managing pain, stiffness, and swelling   If told, put ice on the painful area: ? Put ice in a plastic bag. ? Place a towel between your skin and the bag. ? Leave the ice on for 20 minutes, 2-3 times a day. Stop putting ice on if it does not help with the pain.  Squeeze a soft  ball or a foam pad as much as possible. This prevents swelling in the shoulder. It also helps to strengthen the arm. General instructions  Take over-the-counter and prescription medicines only as told by your doctor.  Keep all follow-up visits as told by your doctor. This is important. Contact a doctor if:  Your pain gets worse.  Medicine does not  help your pain.  You have new pain in your arm, hand, or fingers. Get help right away if:  Your arm, hand, or fingers: ? Tingle. ? Are numb. ? Are swollen. ? Are painful. ? Turn white or blue. Summary  Shoulder pain can be caused by many things. These include injury, moving the shoulder in the same away again and again, and joint pain.  Watch for changes in your symptoms. Let your doctor know about them.  This condition may be treated with a sling, ice, and pain medicine.  Contact your doctor if the pain gets worse or you have new pain. Get help right away if your arm, hand, or fingers tingle or get numb, swollen, or painful.  Keep all follow-up visits as told by your doctor. This is important. This information is not intended to replace advice given to you by your health care provider. Make sure you discuss any questions you have with your health care provider. Document Released: 08/12/2007 Document Revised: 09/07/2017 Document Reviewed: 09/07/2017 Elsevier Interactive Patient Education  2019 Elsevier Inc.      Edwina Barth, MD [facility]

## 2018-04-26 ENCOUNTER — Telehealth: Payer: Self-pay | Admitting: Emergency Medicine

## 2018-04-26 ENCOUNTER — Other Ambulatory Visit: Payer: Self-pay | Admitting: Emergency Medicine

## 2018-04-26 DIAGNOSIS — M25519 Pain in unspecified shoulder: Secondary | ICD-10-CM

## 2018-04-26 NOTE — Telephone Encounter (Signed)
Informed pt of referral, she verbalized understanding.

## 2018-04-26 NOTE — Telephone Encounter (Signed)
She needs referral to orthopedist first.  Referral sent.  Thanks.

## 2018-04-26 NOTE — Telephone Encounter (Signed)
Please adv   Copied from CRM [id number]. Topic: General - Other >> Apr 25, 2018  5:20 PM Jilda Roche wrote: Reason for CRM: Patient states that she would like an MRI, please advise  Best call back is [phone]

## 2018-04-26 NOTE — Telephone Encounter (Signed)
Pt was seen on 04/22/2018 would like to know if she could have referral sent for a MRI of her right shoulder?

## 2018-04-28 ENCOUNTER — Telehealth: Payer: Self-pay | Admitting: Emergency Medicine

## 2018-04-28 NOTE — Telephone Encounter (Signed)
LVM for pt regarding their appt scheduled for 4/10 with Dr. Sagardia. Due to provider being out of the office, we will need to get the pt rescheduled. When pt calls back, please reschedule that appt at their convenience. Thank you! °

## 2018-05-02 ENCOUNTER — Ambulatory Visit (INDEPENDENT_AMBULATORY_CARE_PROVIDER_SITE_OTHER): Payer: 59 | Admitting: Orthopedic Surgery

## 2018-05-02 ENCOUNTER — Ambulatory Visit (INDEPENDENT_AMBULATORY_CARE_PROVIDER_SITE_OTHER): Payer: 59

## 2018-05-02 ENCOUNTER — Encounter (INDEPENDENT_AMBULATORY_CARE_PROVIDER_SITE_OTHER): Payer: Self-pay | Admitting: Orthopedic Surgery

## 2018-05-02 DIAGNOSIS — M25511 Pain in right shoulder: Secondary | ICD-10-CM

## 2018-05-02 MED ORDER — PREDNISONE 5 MG (21) PO TBPK: ORAL_TABLET | ORAL | 0 refills | Status: DC
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
## 2018-05-25 ENCOUNTER — Ambulatory Visit (INDEPENDENT_AMBULATORY_CARE_PROVIDER_SITE_OTHER): Payer: 59 | Admitting: Orthopedic Surgery

## 2018-05-27 ENCOUNTER — Telehealth (INDEPENDENT_AMBULATORY_CARE_PROVIDER_SITE_OTHER): Payer: Self-pay | Admitting: *Deleted

## 2018-05-27 ENCOUNTER — Telehealth (INDEPENDENT_AMBULATORY_CARE_PROVIDER_SITE_OTHER): Payer: Self-pay | Admitting: Orthopedic Surgery

## 2018-05-27 DIAGNOSIS — M25519 Pain in unspecified shoulder: Secondary | ICD-10-CM

## 2018-05-27 NOTE — Telephone Encounter (Signed)
MR arthrogram order entered

## 2018-05-27 NOTE — Telephone Encounter (Signed)
Called pt and lvm #1 

## 2018-05-27 NOTE — Telephone Encounter (Signed)
appt cancelled

## 2018-05-30 ENCOUNTER — Ambulatory Visit (INDEPENDENT_AMBULATORY_CARE_PROVIDER_SITE_OTHER): Payer: 59 | Admitting: Orthopedic Surgery

## 2018-06-06 ENCOUNTER — Other Ambulatory Visit: Payer: Self-pay

## 2018-06-06 ENCOUNTER — Ambulatory Visit (INDEPENDENT_AMBULATORY_CARE_PROVIDER_SITE_OTHER): Payer: 59 | Admitting: Emergency Medicine

## 2018-06-06 DIAGNOSIS — Z23 Encounter for immunization: Secondary | ICD-10-CM | POA: Diagnosis not present

## 2018-06-07 ENCOUNTER — Other Ambulatory Visit: Payer: Self-pay | Admitting: Emergency Medicine

## 2018-06-07 DIAGNOSIS — F419 Anxiety disorder, unspecified: Secondary | ICD-10-CM

## 2018-06-07 NOTE — Telephone Encounter (Signed)
Please advise Patient is requesting a refill of the following medications: Requested Prescriptions   Pending Prescriptions Disp Refills  . diazepam (VALIUM) 5 MG tablet [Pharmacy Med Name: DIAZEPAM 5MG  TABLETS] 30 tablet     Sig: TAKE 1 TABLET BY MOUTH EVERY DAY AS NEEDED FOR ANXIETY

## 2018-06-07 NOTE — Telephone Encounter (Signed)
Requested medication (s) are due for refill today: yes  Requested medication (s) are on the active medication list: yes  Last refill:  04/22/18  Future visit scheduled: yes  Notes to clinic:  Medication not delegated to NT to refill   Requested Prescriptions  Pending Prescriptions Disp Refills   diazepam (VALIUM) 5 MG tablet [Pharmacy Med Name: DIAZEPAM 5MG  TABLETS] 30 tablet     Sig: TAKE 1 TABLET BY MOUTH EVERY DAY AS NEEDED FOR ANXIETY     Not Delegated - Psychiatry:  Anxiolytics/Hypnotics Failed - 06/07/2018  2:21 PM      Failed - This refill cannot be delegated      Failed - Urine Drug Screen completed in last 360 days.      Failed - Valid encounter within last 6 months    Recent Outpatient Visits          Yesterday Need for prophylactic vaccination and inoculation against influenza   Primary Care at [facility], Eilleen Kempf, MD   1 month ago Arthralgia of shoulder, unspecified laterality   Primary Care at [facility], Eilleen Kempf, MD   3 months ago Chest tightness   Primary Care at [facility], Eilleen Kempf, MD   5 months ago Palpitations   [facility] at [facility], Meda Coffee, MD   5 months ago Essential hypertension   Primary Care at [facility], Eilleen Kempf, MD      Future Appointments            In 6 days Sagardia, Eilleen Kempf, MD Primary Care at [facility], [facility]

## 2018-06-08 ENCOUNTER — Other Ambulatory Visit: Payer: Self-pay

## 2018-06-08 DIAGNOSIS — E785 Hyperlipidemia, unspecified: Secondary | ICD-10-CM

## 2018-06-08 DIAGNOSIS — I10 Essential (primary) hypertension: Secondary | ICD-10-CM

## 2018-06-13 ENCOUNTER — Other Ambulatory Visit: Payer: Self-pay

## 2018-06-13 ENCOUNTER — Ambulatory Visit (INDEPENDENT_AMBULATORY_CARE_PROVIDER_SITE_OTHER): Payer: 59 | Admitting: Emergency Medicine

## 2018-06-13 ENCOUNTER — Ambulatory Visit: Payer: 59 | Admitting: Emergency Medicine

## 2018-06-13 DIAGNOSIS — I10 Essential (primary) hypertension: Secondary | ICD-10-CM

## 2018-06-13 DIAGNOSIS — E785 Hyperlipidemia, unspecified: Secondary | ICD-10-CM

## 2018-06-14 LAB — COMPREHENSIVE METABOLIC PANEL
ALT: 26 IU/L (ref 0–32)
AST: 20 IU/L (ref 0–40)
Albumin/Globulin Ratio: 1.6 (ref 1.2–2.2)
Albumin: 4.4 g/dL (ref 3.8–4.8)
Alkaline Phosphatase: 96 IU/L (ref 39–117)
BUN/Creatinine Ratio: 26 (ref 12–28)
BUN: 22 mg/dL (ref 8–27)
Bilirubin Total: 0.2 mg/dL (ref 0.0–1.2)
CO2: 25 mmol/L (ref 20–29)
Calcium: 9.7 mg/dL (ref 8.7–10.3)
Chloride: 101 mmol/L (ref 96–106)
Creatinine, Ser: 0.85 mg/dL (ref 0.57–1.00)
GFR calc Af Amer: 85 mL/min/{1.73_m2} (ref >59)
GFR calc non Af Amer: 74 mL/min/{1.73_m2} (ref >59)
Globulin, Total: 2.7 g/dL (ref 1.5–4.5)
Glucose: 108 mg/dL — ABNORMAL HIGH (ref 65–99)
Potassium: 4.2 mmol/L (ref 3.5–5.2)
Sodium: 141 mmol/L (ref 134–144)
Total Protein: 7.1 g/dL (ref 6.0–8.5)

## 2018-06-14 LAB — CBC
Hematocrit: 42 % (ref 34.0–46.6)
Hemoglobin: 13.7 g/dL (ref 11.1–15.9)
MCH: 28.5 pg (ref 26.6–33.0)
MCHC: 32.6 g/dL (ref 31.5–35.7)
MCV: 88 fL (ref 79–97)
Platelets: 228 10*3/uL (ref 150–450)
RBC: 4.8 x10E6/uL (ref 3.77–5.28)
RDW: 12.3 % (ref 11.7–15.4)
WBC: 5.4 10*3/uL (ref 3.4–10.8)

## 2018-06-14 LAB — LIPID PANEL
Chol/HDL Ratio: 3.4 ratio (ref 0.0–4.4)
Cholesterol, Total: 218 mg/dL — ABNORMAL HIGH (ref 100–199)
HDL: 65 mg/dL (ref >39)
LDL Calculated: 129 mg/dL — ABNORMAL HIGH (ref 0–99)
Triglycerides: 120 mg/dL (ref 0–149)
VLDL Cholesterol Cal: 24 mg/dL (ref 5–40)

## 2018-06-14 LAB — TSH: TSH: 1.7 u[IU]/mL (ref 0.450–4.500)

## 2018-06-16 ENCOUNTER — Encounter: Payer: Self-pay | Admitting: Emergency Medicine

## 2018-06-16 ENCOUNTER — Telehealth: Payer: Self-pay | Admitting: *Deleted

## 2018-06-16 ENCOUNTER — Other Ambulatory Visit: Payer: Self-pay

## 2018-06-16 ENCOUNTER — Ambulatory Visit (INDEPENDENT_AMBULATORY_CARE_PROVIDER_SITE_OTHER): Payer: 59 | Admitting: Emergency Medicine

## 2018-06-16 DIAGNOSIS — F411 Generalized anxiety disorder: Secondary | ICD-10-CM

## 2018-06-16 DIAGNOSIS — M25519 Pain in unspecified shoulder: Secondary | ICD-10-CM | POA: Diagnosis not present

## 2018-06-16 DIAGNOSIS — E785 Hyperlipidemia, unspecified: Secondary | ICD-10-CM

## 2018-06-16 DIAGNOSIS — F419 Anxiety disorder, unspecified: Secondary | ICD-10-CM | POA: Diagnosis not present

## 2018-06-16 DIAGNOSIS — I10 Essential (primary) hypertension: Secondary | ICD-10-CM

## 2018-06-16 DIAGNOSIS — F329 Major depressive disorder, single episode, unspecified: Secondary | ICD-10-CM

## 2018-06-16 MED ORDER — IBUPROFEN 600 MG PO TABS: 600.0000 mg | ORAL_TABLET | Freq: Three times a day (TID) | ORAL | 0 refills | Status: DC | PRN

## 2018-06-16 MED ORDER — FLUOXETINE HCL 20 MG PO TABS: 20.0000 mg | ORAL_TABLET | Freq: Every day | ORAL | 3 refills | Status: DC

## 2018-06-16 NOTE — Telephone Encounter (Signed)
Tried to contact patient to triage for her 8:20 am Webex appointment, left message in mobile voice mail to call back.

## 2018-06-16 NOTE — Progress Notes (Signed)
Telemedicine Encounter- SOAP NOTE Established Patient  This telephone encounter was conducted with the patient's (or proxy's) verbal consent via audio telecommunications: yes/no: Yes Patient was instructed to have this encounter in a suitably private space; and to only have persons present to whom they give permission to participate. In addition, patient identity was confirmed by use of name plus two identifiers (DOB and address).  I discussed the limitations, risks, security and privacy concerns of performing an evaluation and management service by telephone and the availability of in person appointments. I also discussed with the patient that there may be a patient responsible charge related to this service. The patient expressed understanding and agreed to proceed.  I spent a total of TIME; 0 MIN TO 60 MIN: 15 minutes talking with the patient or their proxy.  Chief Complaint  Patient presents with  . Hypertension    follow up and cholesterol  . Medication Refill    ibuprofen and Valium    Subjective   Katie Gardner is a 63 y.o. female established patient. Telephone visit today for follow-up on chronic medical problems.  Also has a history of anxiety and depression presently on Zoloft 100 mg a day but would like to switch to Prozac instead.  No other complaints or medical concerns. Results for orders placed or performed in visit on 06/13/18 (from the past 72 hour(s))  CBC     Status: None   Collection Time: 06/13/18 11:42 AM  Result Value Ref Range   WBC 5.4 3.4 - 10.8 x10E3/uL   RBC 4.80 3.77 - 5.28 x10E6/uL   Hemoglobin 13.7 11.1 - 15.9 g/dL   Hematocrit 16.1 09.6 - 46.6 %   MCV 88 79 - 97 fL   MCH 28.5 26.6 - 33.0 pg   MCHC 32.6 31.5 - 35.7 g/dL   RDW 04.5 40.9 - 81.1 %   Platelets 228 150 - 450 x10E3/uL  TSH     Status: None   Collection Time: 06/13/18 11:42 AM  Result Value Ref Range   TSH 1.700 0.450 - 4.500 uIU/mL  Comprehensive metabolic panel     Status: Abnormal   Collection Time: 06/13/18 11:42 AM  Result Value Ref Range   Glucose 108 (H) 65 - 99 mg/dL   BUN 22 8 - 27 mg/dL   Creatinine, Ser 9.14 0.57 - 1.00 mg/dL   GFR calc non Af Amer 74 >59 mL/min/1.73   GFR calc Af Amer 85 >59 mL/min/1.73   BUN/Creatinine Ratio 26 12 - 28   Sodium 141 134 - 144 mmol/L   Potassium 4.2 3.5 - 5.2 mmol/L   Chloride 101 96 - 106 mmol/L   CO2 25 20 - 29 mmol/L   Calcium 9.7 8.7 - 10.3 mg/dL   Total Protein 7.1 6.0 - 8.5 g/dL   Albumin 4.4 3.8 - 4.8 g/dL   Globulin, Total 2.7 1.5 - 4.5 g/dL   Albumin/Globulin Ratio 1.6 1.2 - 2.2   Bilirubin Total 0.2 0.0 - 1.2 mg/dL   Alkaline Phosphatase 96 39 - 117 IU/L   AST 20 0 - 40 IU/L   ALT 26 0 - 32 IU/L  Lipid panel     Status: Abnormal   Collection Time: 06/13/18 11:42 AM  Result Value Ref Range   Cholesterol, Total 218 (H) 100 - 199 mg/dL   Triglycerides 782 0 - 149 mg/dL   HDL 65 >95 mg/dL   VLDL Cholesterol Cal 24 5 - 40 mg/dL   LDL Calculated 621 (  H) 0 - 99 mg/dL   Chol/HDL Ratio 3.4 0.0 - 4.4 ratio    Comment:                                   T. Chol/HDL Ratio                                             Men  Women                               1/2 Avg.Risk  3.4    3.3                                   Avg.Risk  5.0    4.4                                2X Avg.Risk  9.6    7.1                                3X Avg.Risk 23.4   11.0    The 10-year ASCVD risk score Denman George DC Jr., et al., 2013) is: 5.5%   Values used to calculate the score:     Age: 66 years     Sex: Female     Is Non-Hispanic African American: No     Diabetic: No     Tobacco smoker: No     Systolic Blood Pressure: 131 mmHg     Is BP treated: Yes     HDL Cholesterol: 65 mg/dL     Total Cholesterol: 218 mg/dL   HPI   Patient Active Problem List   Diagnosis Date Noted  . Chronic neck pain 12/17/2017  . Essential hypertension 12/17/2017  . Seasonal allergies 12/17/2017  . Depression 11/13/2016  . Chronic anxiety 11/13/2016  .  Chronic sore throat 11/13/2016  . Hot flashes   . Hyperlipidemia     Past Medical History:  Diagnosis Date  . Allergy    seasonal  . Anxiety   . Depression   . Hematuria   . Hot flashes   . Hx of cardiovascular stress test    Lex MV 12/13:  EF 68%, mild apical thinning, no ischemia  . Hyperlipidemia     Current Outpatient Medications  Medication Sig Dispense Refill  . cetirizine (ZYRTEC) 10 MG tablet Take 1 tablet (10 mg total) by mouth daily. 90 tablet 1  . cyclobenzaprine (FLEXERIL) 5 MG tablet Take 1 tablet (5 mg total) by mouth 3 (three) times daily as needed for muscle spasms. 30 tablet 1  . diazepam (VALIUM) 5 MG tablet TAKE 1 TABLET BY MOUTH EVERY DAY AS NEEDED FOR ANXIETY 30 tablet 0  . ibuprofen (ADVIL,MOTRIN) 600 MG tablet Take 1 tablet (600 mg total) by mouth every 8 (eight) hours as needed for moderate pain. 30 tablet 0  . lisinopril (PRINIVIL,ZESTRIL) 10 MG tablet Take 1 tablet (10 mg total) by mouth daily. 90 tablet 3  . simvastatin (ZOCOR) 40 MG tablet TAKE 1 TABLET(40 MG) BY MOUTH DAILY  90 tablet 3  . FLUoxetine (PROZAC) 20 MG tablet Take 1 tablet (20 mg total) by mouth daily. 30 tablet 3  . predniSONE (STERAPRED UNI-PAK 21 TAB) 5 MG (21) TBPK tablet TAKE DOSEPAK AS DIRECTED (Patient not taking: Reported on 06/16/2018) 21 tablet 0   No current facility-administered medications for this visit.     Allergies  Allergen Reactions  . Hydrocodone Nausea And Vomiting    dizzy  . Mucinex Dm [Dm-Guaifenesin Er] Swelling    Sob, throat closing    Social History   Socioeconomic History  . Marital status: Legally Separated    Spouse name: Not on file  . Number of children: 1  . Years of education: 8th grade  . Highest education level: Not on file  Occupational History    Employer: UNEMPLOYED  Social Needs  . Financial resource strain: Not hard at all  . Food insecurity:    Worry: Never true    Inability: Never true  . Transportation needs:    Medical: No     Non-medical: No  Tobacco Use  . Smoking status: Former Games developer  . Smokeless tobacco: Never Used  Substance and Sexual Activity  . Alcohol use: No  . Drug use: No  . Sexual activity: Yes  Lifestyle  . Physical activity:    Days per week: 0 days    Minutes per session: 0 min  . Stress: To some extent  Relationships  . Social connections:    Talks on phone: Once a week    Gets together: Never    Attends religious service: Never    Active member of club or organization: No    Attends meetings of clubs or organizations: Never    Relationship status: Separated  . Intimate partner violence:    Fear of current or ex partner: No    Emotionally abused: No    Physically abused: No    Forced sexual activity: No  Other Topics Concern  . Not on file  Social History Narrative  . Not on file    Review of Systems  Constitutional: Negative.  Negative for chills and fever.  HENT: Negative for congestion and sore throat.   Respiratory: Negative for cough and shortness of breath.   Cardiovascular: Negative for chest pain and palpitations.  Gastrointestinal: Negative.  Negative for abdominal pain, diarrhea, nausea and vomiting.  Genitourinary: Negative for dysuria and hematuria.  Skin: Negative.  Negative for rash.  Neurological: Negative for dizziness and headaches.  All other systems reviewed and are negative.   Objective   Vitals as reported by the patient: None available There were no vitals filed for this visit. Alert and oriented x3, in no respiratory distress.  Katie was seen today for hypertension and medication refill.  Diagnoses and all orders for this visit:  Essential hypertension  Arthralgia of shoulder, unspecified laterality -     ibuprofen (ADVIL,MOTRIN) 600 MG tablet; Take 1 tablet (600 mg total) by mouth every 8 (eight) hours as needed for moderate pain.  Chronic anxiety  Generalized anxiety disorder  Anxiety and depression -     FLUoxetine (PROZAC) 20 MG  tablet; Take 1 tablet (20 mg total) by mouth daily.  Hyperlipidemia, unspecified hyperlipidemia type  Clinically stable.  No medical concerns identified during this visit. Continue present medications. Stop Zoloft and start Prozac 20 mg a day. Follow-up in 3 to 6 months.   I discussed the assessment and treatment plan with the patient. The patient was provided an opportunity  to ask questions and all were answered. The patient agreed with the plan and demonstrated an understanding of the instructions.   The patient was advised to call back or seek an in-person evaluation if the symptoms worsen or if the condition fails to improve as anticipated.  I provided 15 minutes of non-face-to-face time during this encounter.  Georgina QuintMiguel Jose Canisha Issac, MD  Primary Care at [facility]

## 2018-06-17 ENCOUNTER — Ambulatory Visit: Payer: 59 | Admitting: Emergency Medicine

## 2018-06-21 ENCOUNTER — Other Ambulatory Visit: Payer: Self-pay | Admitting: Emergency Medicine

## 2018-06-21 DIAGNOSIS — F329 Major depressive disorder, single episode, unspecified: Secondary | ICD-10-CM

## 2018-06-21 DIAGNOSIS — F419 Anxiety disorder, unspecified: Principal | ICD-10-CM

## 2018-06-23 ENCOUNTER — Telehealth: Payer: Self-pay | Admitting: Emergency Medicine

## 2018-06-23 NOTE — Telephone Encounter (Signed)
Pt states she can use [facility] and get it for $4.00. The pharmacy just needs doctor approval.  [facility] [address], [place] - [address]  [address] [place] [address]  Phone: [phone] Fax: [phone]

## 2018-06-23 NOTE — Telephone Encounter (Signed)
Copied from CRM [id number]. Topic: Quick Communication - See Telephone Encounter >> Jun 23, 2018  3:08 PM Jens Som A wrote: CRM for notification. See Telephone encounter for: 06/23/18.  Patient is calling regarding FLUoxetine (PROZAC) 20 MG tablet [[id number]] The pharmacy states that the insurance does not want to pay for this medication. A Prior Authorization is requested.  Please advise. Thank you. CB- [phone] (M)

## 2018-06-24 NOTE — Telephone Encounter (Signed)
Pt states insurance company is requiring a prior auth:   [id number]

## 2018-06-24 NOTE — Telephone Encounter (Signed)
Spoke with about concerns and she states she will try to go back to the pharmacy and see if she can just use the [facility] coupon and get the medication for 4.00 and if they give her trouble she will call use back.

## 2018-06-27 NOTE — Telephone Encounter (Signed)
PA was approved for the medication. Spoke with the pharmacy to go ahead and fill the prescription

## 2018-07-25 ENCOUNTER — Ambulatory Visit: Payer: Self-pay | Admitting: *Deleted

## 2018-07-25 NOTE — Telephone Encounter (Signed)
Pt scheduled for appointment tomorrow.

## 2018-07-25 NOTE — Telephone Encounter (Signed)
Pt reports BP this am 152/77 (0830)  States took medication, rechecked BP at 1100.. 204/107 HR 64. States mild nausea, mild SOB and "Chest maybe a little tight." Denies visual changes, no headache. TN asked pt to check BP during call, 149/80  HR 55 Pt states "I feel much better now, no chest pain, maybe I was anxious." Pt questioning if she should increase the dosage of her lisinopril. TN called practice, call transferred. Pt does not have access to computer, no email. Reason for Disposition . Systolic BP  >= 180 OR Diastolic >= 110  Answer Assessment - Initial Assessment Questions 1. BLOOD PRESSURE: "What is the blood pressure?" "Did you take at least two measurements 5 minutes apart?"     204/104, during call 149/80 2. ONSET: "When did you take your blood pressure?"     1100 3. HOW: "How did you obtain the blood pressure?" (e.g., visiting nurse, automatic home BP monitor)    home 4. HISTORY: "Do you have a history of high blood pressure?"     yes 5. MEDICATIONS: "Are you taking any medications for blood pressure?" "Have you missed any doses recently?"     Yes, no missed doses 6. OTHER SYMPTOMS: "Do you have any symptoms?" (e.g., headache, chest pain, blurred vision, difficulty breathing, weakness) "Chest a little tight" LAter in call denied  Protocols used: HIGH BLOOD PRESSURE-A-AH

## 2018-07-26 ENCOUNTER — Telehealth (INDEPENDENT_AMBULATORY_CARE_PROVIDER_SITE_OTHER): Payer: 59 | Admitting: Emergency Medicine

## 2018-07-26 ENCOUNTER — Telehealth: Payer: Self-pay | Admitting: *Deleted

## 2018-07-26 ENCOUNTER — Encounter: Payer: Self-pay | Admitting: Emergency Medicine

## 2018-07-26 ENCOUNTER — Other Ambulatory Visit: Payer: Self-pay

## 2018-07-26 DIAGNOSIS — I10 Essential (primary) hypertension: Secondary | ICD-10-CM

## 2018-07-26 NOTE — Telephone Encounter (Signed)
Called patient to triage for appointment, left message in mobile voicemail to call back. 

## 2018-07-26 NOTE — Progress Notes (Signed)
Telemedicine Encounter- SOAP NOTE Established Patient  This telephone encounter was conducted with the patient's (or proxy's) verbal consent via audio telecommunications: yes/no: Yes Patient was instructed to have this encounter in a suitably private space; and to only have persons present to whom they give permission to participate. In addition, patient identity was confirmed by use of name plus two identifiers (DOB and address).  I discussed the limitations, risks, security and privacy concerns of performing an evaluation and management service by telephone and the availability of in person appointments. I also discussed with the patient that there may be a patient responsible charge related to this service. The patient expressed understanding and agreed to proceed.  I spent a total of TIME; 0 MIN TO 60 MIN: 15 minutes talking with the patient or their proxy.  No chief complaint on file. Elevated blood pressure   Subjective   Katie Gardner is a 63 y.o. female established patient. Telephone visit today for elevated blood pressure.  States her blood pressure at home lately has been elevated with an average of 150/90 and at times higher.  Asymptomatic.  No other complaints or medical concerns.  BP Readings from Last 3 Encounters:  04/22/18 131/67  02/17/18 (!) 178/84  01/03/18 (!) 142/78   Recent Results (from the past 2160 hour(s))  CBC     Status: None   Collection Time: 06/13/18 11:42 AM  Result Value Ref Range   WBC 5.4 3.4 - 10.8 x10E3/uL   RBC 4.80 3.77 - 5.28 x10E6/uL   Hemoglobin 13.7 11.1 - 15.9 g/dL   Hematocrit 53.0 05.1 - 46.6 %   MCV 88 79 - 97 fL   MCH 28.5 26.6 - 33.0 pg   MCHC 32.6 31.5 - 35.7 g/dL   RDW 10.2 11.1 - 73.5 %   Platelets 228 150 - 450 x10E3/uL  TSH     Status: None   Collection Time: 06/13/18 11:42 AM  Result Value Ref Range   TSH 1.700 0.450 - 4.500 uIU/mL  Comprehensive metabolic panel     Status: Abnormal   Collection Time: 06/13/18 11:42 AM   Result Value Ref Range   Glucose 108 (H) 65 - 99 mg/dL   BUN 22 8 - 27 mg/dL   Creatinine, Ser 6.70 0.57 - 1.00 mg/dL   GFR calc non Af Amer 74 >59 mL/min/1.73   GFR calc Af Amer 85 >59 mL/min/1.73   BUN/Creatinine Ratio 26 12 - 28   Sodium 141 134 - 144 mmol/L   Potassium 4.2 3.5 - 5.2 mmol/L   Chloride 101 96 - 106 mmol/L   CO2 25 20 - 29 mmol/L   Calcium 9.7 8.7 - 10.3 mg/dL   Total Protein 7.1 6.0 - 8.5 g/dL   Albumin 4.4 3.8 - 4.8 g/dL   Globulin, Total 2.7 1.5 - 4.5 g/dL   Albumin/Globulin Ratio 1.6 1.2 - 2.2   Bilirubin Total 0.2 0.0 - 1.2 mg/dL   Alkaline Phosphatase 96 39 - 117 IU/L   AST 20 0 - 40 IU/L   ALT 26 0 - 32 IU/L  Lipid panel     Status: Abnormal   Collection Time: 06/13/18 11:42 AM  Result Value Ref Range   Cholesterol, Total 218 (H) 100 - 199 mg/dL   Triglycerides 141 0 - 149 mg/dL   HDL 65 >03 mg/dL   VLDL Cholesterol Cal 24 5 - 40 mg/dL   LDL Calculated 013 (H) 0 - 99 mg/dL   Chol/HDL Ratio 3.4  0.0 - 4.4 ratio    Comment:                                   T. Chol/HDL Ratio                                             Men  Women                               1/2 Avg.Risk  3.4    3.3                                   Avg.Risk  5.0    4.4                                2X Avg.Risk  9.6    7.1                                3X Avg.Risk 23.4   11.0     HPI   Patient Active Problem List   Diagnosis Date Noted  . Chronic neck pain 12/17/2017  . Essential hypertension 12/17/2017  . Seasonal allergies 12/17/2017  . Depression 11/13/2016  . Chronic anxiety 11/13/2016  . Chronic sore throat 11/13/2016  . Hot flashes   . Hyperlipidemia     Past Medical History:  Diagnosis Date  . Allergy    seasonal  . Anxiety   . Depression   . Hematuria   . Hot flashes   . Hx of cardiovascular stress test    Lex MV 12/13:  EF 68%, mild apical thinning, no ischemia  . Hyperlipidemia     Current Outpatient Medications  Medication Sig Dispense Refill  .  cyclobenzaprine (FLEXERIL) 5 MG tablet Take 1 tablet (5 mg total) by mouth 3 (three) times daily as needed for muscle spasms. 30 tablet 1  . diazepam (VALIUM) 5 MG tablet TAKE 1 TABLET BY MOUTH EVERY DAY AS NEEDED FOR ANXIETY 30 tablet 0  . FLUoxetine (PROZAC) 20 MG tablet Take 1 tablet (20 mg total) by mouth daily. 30 tablet 3  . ibuprofen (ADVIL,MOTRIN) 600 MG tablet Take 1 tablet (600 mg total) by mouth every 8 (eight) hours as needed for moderate pain. 30 tablet 0  . lisinopril (PRINIVIL,ZESTRIL) 10 MG tablet Take 1 tablet (10 mg total) by mouth daily. 90 tablet 3  . simvastatin (ZOCOR) 40 MG tablet TAKE 1 TABLET(40 MG) BY MOUTH DAILY 90 tablet 3  . cetirizine (ZYRTEC) 10 MG tablet Take 1 tablet (10 mg total) by mouth daily. (Patient not taking: Reported on 07/26/2018) 90 tablet 1  . predniSONE (STERAPRED UNI-PAK 21 TAB) 5 MG (21) TBPK tablet TAKE DOSEPAK AS DIRECTED (Patient not taking: Reported on 07/26/2018) 21 tablet 0   No current facility-administered medications for this visit.     Allergies  Allergen Reactions  . Hydrocodone Nausea And Vomiting    dizzy  . Mucinex Dm [Dm-Guaifenesin Er] Swelling    Sob, throat closing    Social History  Socioeconomic History  . Marital status: Legally Separated    Spouse name: Not on file  . Number of children: 1  . Years of education: 8th grade  . Highest education level: Not on file  Occupational History    Employer: UNEMPLOYED  Social Needs  . Financial resource strain: Not hard at all  . Food insecurity:    Worry: Never true    Inability: Never true  . Transportation needs:    Medical: No    Non-medical: No  Tobacco Use  . Smoking status: Former Games developermoker  . Smokeless tobacco: Never Used  Substance and Sexual Activity  . Alcohol use: No  . Drug use: No  . Sexual activity: Yes  Lifestyle  . Physical activity:    Days per week: 0 days    Minutes per session: 0 min  . Stress: To some extent  Relationships  . Social  connections:    Talks on phone: Once a week    Gets together: Never    Attends religious service: Never    Active member of club or organization: No    Attends meetings of clubs or organizations: Never    Relationship status: Separated  . Intimate partner violence:    Fear of current or ex partner: No    Emotionally abused: No    Physically abused: No    Forced sexual activity: No  Other Topics Concern  . Not on file  Social History Narrative  . Not on file    Review of Systems  Constitutional: Negative.  Negative for chills and fever.  HENT: Negative.  Negative for nosebleeds and sore throat.   Eyes: Negative.  Negative for blurred vision and double vision.  Respiratory: Negative.  Negative for cough and shortness of breath.   Cardiovascular: Negative for chest pain and palpitations.  Gastrointestinal: Negative.  Negative for abdominal pain, diarrhea, nausea and vomiting.  Genitourinary: Negative.  Negative for hematuria.  Musculoskeletal: Negative.  Negative for myalgias.  Skin: Negative.  Negative for rash.  Neurological: Negative.  Negative for dizziness and headaches.  All other systems reviewed and are negative.   Objective   Vitals as reported by the patient: Self-reported average blood pressure 150/90 Awake and oriented x3 in no apparent respiratory distress. There were no vitals filed for this visit.  There are no diagnoses linked to this encounter.   Diagnoses and all orders for this visit:  Uncontrolled hypertension    Clinically stable but elevated blood pressure readings at home.  Recommend to increase lisinopril to 20 mg a day.  Continue monitoring blood pressure and notify the office if persistently elevated still. Follow-up in the office in 2 to 3 months.  I discussed the assessment and treatment plan with the patient. The patient was provided an opportunity to ask questions and all were answered. The patient agreed with the plan and demonstrated an  understanding of the instructions.   The patient was advised to call back or seek an in-person evaluation if the symptoms worsen or if the condition fails to improve as anticipated.  I provided 15 minutes of non-face-to-face time during this encounter.  Georgina QuintMiguel Jose Melora Menon, MD  [facility]

## 2018-07-26 NOTE — Progress Notes (Signed)
Called patient to triage for appointment. Patient is concerned about the blood pressure readings. Patient states her readings today was 133/68 and 117/68. Patient states the blood pressure yesterday was 240/104.

## 2018-08-25 ENCOUNTER — Other Ambulatory Visit: Payer: Self-pay | Admitting: Emergency Medicine

## 2018-08-25 DIAGNOSIS — F419 Anxiety disorder, unspecified: Secondary | ICD-10-CM

## 2018-08-26 NOTE — Telephone Encounter (Signed)
Please advise on med refill pt was last seen 07/26/18 uncontrolled HTN

## 2018-09-12 ENCOUNTER — Ambulatory Visit (INDEPENDENT_AMBULATORY_CARE_PROVIDER_SITE_OTHER): Payer: 59 | Admitting: Emergency Medicine

## 2018-09-12 ENCOUNTER — Encounter: Payer: Self-pay | Admitting: Emergency Medicine

## 2018-09-12 ENCOUNTER — Other Ambulatory Visit: Payer: Self-pay

## 2018-09-12 VITALS — BP 162/68 | HR 60 | Temp 98.7°F | Resp 97 | Wt 136.4 lb

## 2018-09-12 DIAGNOSIS — E785 Hyperlipidemia, unspecified: Secondary | ICD-10-CM | POA: Diagnosis not present

## 2018-09-12 DIAGNOSIS — M25519 Pain in unspecified shoulder: Secondary | ICD-10-CM | POA: Diagnosis not present

## 2018-09-12 DIAGNOSIS — F410 Panic disorder [episodic paroxysmal anxiety] without agoraphobia: Secondary | ICD-10-CM | POA: Diagnosis not present

## 2018-09-12 DIAGNOSIS — I10 Essential (primary) hypertension: Secondary | ICD-10-CM

## 2018-09-12 MED ORDER — LORATADINE 10 MG PO TABS: 10.0000 mg | ORAL_TABLET | Freq: Every day | ORAL | 11 refills | Status: DC

## 2018-09-12 MED ORDER — CYCLOBENZAPRINE HCL 5 MG PO TABS: 5.0000 mg | ORAL_TABLET | Freq: Three times a day (TID) | ORAL | 1 refills | Status: DC | PRN

## 2018-09-12 MED ORDER — IBUPROFEN 600 MG PO TABS: 600.0000 mg | ORAL_TABLET | Freq: Three times a day (TID) | ORAL | 0 refills | Status: DC | PRN

## 2018-09-12 NOTE — Patient Instructions (Addendum)
   If you have lab work done today you will be contacted with your lab results within the next 2 weeks.  If you have not heard from us then please contact us. The fastest way to get your results is to register for My Chart.   IF you received an x-ray today, you will receive an invoice from [facility]. Please contact [facility] at [phone] with questions or concerns regarding your invoice.   IF you received labwork today, you will receive an invoice from [facility]. Please contact [facility] at [phone] with questions or concerns regarding your invoice.   Our billing staff will not be able to assist you with questions regarding bills from these companies.  You will be contacted with the lab results as soon as they are available. The fastest way to get your results is to activate your My Chart account. Instructions are located on the last page of this paperwork. If you have not heard from us regarding the results in 2 weeks, please contact this office.      Health Maintenance, Female Adopting a healthy lifestyle and getting preventive care are important in promoting health and wellness. Ask your health care provider about:  The right schedule for you to have regular tests and exams.  Things you can do on your own to prevent diseases and keep yourself healthy. What should I know about diet, weight, and exercise? Eat a healthy diet   Eat a diet that includes plenty of vegetables, fruits, low-fat dairy products, and lean protein.  Do not eat a lot of foods that are high in solid fats, added sugars, or sodium. Maintain a healthy weight Body mass index (BMI) is used to identify weight problems. It estimates body fat based on height and weight. Your health care provider can help determine your BMI and help you achieve or maintain a healthy weight. Get regular exercise Get regular exercise. This is one of the most important things you can do for your health. Most  adults should:  Exercise for at least 150 minutes each week. The exercise should increase your heart rate and make you sweat (moderate-intensity exercise).  Do strengthening exercises at least twice a week. This is in addition to the moderate-intensity exercise.  Spend less time sitting. Even light physical activity can be beneficial. Watch cholesterol and blood lipids Have your blood tested for lipids and cholesterol at 63 years of age, then have this test every 5 years. Have your cholesterol levels checked more often if:  Your lipid or cholesterol levels are high.  You are older than 63 years of age.  You are at high risk for heart disease. What should I know about cancer screening? Depending on your health history and family history, you may need to have cancer screening at various ages. This may include screening for:  Breast cancer.  Cervical cancer.  Colorectal cancer.  Skin cancer.  Lung cancer. What should I know about heart disease, diabetes, and high blood pressure? Blood pressure and heart disease  High blood pressure causes heart disease and increases the risk of stroke. This is more likely to develop in people who have high blood pressure readings, are of African descent, or are overweight.  Have your blood pressure checked: ? Every 3-5 years if you are 18-39 years of age. ? Every year if you are 40 years old or older. Diabetes Have regular diabetes screenings. This checks your fasting blood sugar level. Have the screening done:  Once every   three years after age 40 if you are at a normal weight and have a low risk for diabetes.  More often and at a younger age if you are overweight or have a high risk for diabetes. What should I know about preventing infection? Hepatitis B If you have a higher risk for hepatitis B, you should be screened for this virus. Talk with your health care provider to find out if you are at risk for hepatitis B infection. Hepatitis  C Testing is recommended for:  Everyone born from 1945 through 1965.  Anyone with known risk factors for hepatitis C. Sexually transmitted infections (STIs)  Get screened for STIs, including gonorrhea and chlamydia, if: ? You are sexually active and are younger than 63 years of age. ? You are older than 63 years of age and your health care provider tells you that you are at risk for this type of infection. ? Your sexual activity has changed since you were last screened, and you are at increased risk for chlamydia or gonorrhea. Ask your health care provider if you are at risk.  Ask your health care provider about whether you are at high risk for HIV. Your health care provider may recommend a prescription medicine to help prevent HIV infection. If you choose to take medicine to prevent HIV, you should first get tested for HIV. You should then be tested every 3 months for as long as you are taking the medicine. Pregnancy  If you are about to stop having your period (premenopausal) and you may become pregnant, seek counseling before you get pregnant.  Take 400 to 800 micrograms (mcg) of folic acid every day if you become pregnant.  Ask for birth control (contraception) if you want to prevent pregnancy. Osteoporosis and menopause Osteoporosis is a disease in which the bones lose minerals and strength with aging. This can result in bone fractures. If you are 65 years old or older, or if you are at risk for osteoporosis and fractures, ask your health care provider if you should:  Be screened for bone loss.  Take a calcium or vitamin D supplement to lower your risk of fractures.  Be given hormone replacement therapy (HRT) to treat symptoms of menopause. Follow these instructions at home: Lifestyle  Do not use any products that contain nicotine or tobacco, such as cigarettes, e-cigarettes, and chewing tobacco. If you need help quitting, ask your health care provider.  Do not use street  drugs.  Do not share needles.  Ask your health care provider for help if you need support or information about quitting drugs. Alcohol use  Do not drink alcohol if: ? Your health care provider tells you not to drink. ? You are pregnant, may be pregnant, or are planning to become pregnant.  If you drink alcohol: ? Limit how much you use to 0-1 drink a day. ? Limit intake if you are breastfeeding.  Be aware of how much alcohol is in your drink. In the [place] one drink equals one 12 oz bottle of beer (355 mL), one 5 oz glass of wine (148 mL), or one 1 oz glass of hard liquor (44 mL). General instructions  Schedule regular health, dental, and eye exams.  Stay current with your vaccines.  Tell your health care provider if: ? You often feel depressed. ? You have ever been abused or do not feel safe at home. Summary  Adopting a healthy lifestyle and getting preventive care are important in promoting health and wellness.    Follow your health care provider's instructions about healthy diet, exercising, and getting tested or screened for diseases.  Follow your health care provider's instructions on monitoring your cholesterol and blood pressure. This information is not intended to replace advice given to you by your health care provider. Make sure you discuss any questions you have with your health care provider. Document Released: 09/08/2010 Document Revised: 02/16/2018 Document Reviewed: 02/16/2018 Elsevier Patient Education  2020 [employer]  

## 2018-09-12 NOTE — Progress Notes (Signed)
Tonyia Gura 63 y.o.   Chief Complaint  Patient presents with  . Medication Refill    Cyclobenzaprine and Ibuprofen    HISTORY OF PRESENT ILLNESS: This is a 63 y.o. female with history of hypertension and hyperlipidemia here for follow-up and medication refill.  Also has a history of intermittent muscle aches with spasm requesting Flexeril and ibuprofen.  Otherwise doing well.  Has no complaints or medical concerns today.  HPI   [name]   Medication Sig Start Date End Date Taking? Authorizing Provider  cetirizine (ZYRTEC) 10 MG tablet Take 1 tablet (10 mg total) by mouth daily. 12/17/17  Yes Arnella Pralle, Ines Bloomer, MD  cyclobenzaprine (FLEXERIL) 5 MG tablet Take 1 tablet (5 mg total) by mouth 3 (three) times daily as needed for muscle spasms. 04/22/18  Yes Hisako Bugh, Ines Bloomer, MD  diazepam (VALIUM) 5 MG tablet TAKE 1 TABLET BY MOUTH EVERY DAY AS NEEDED FOR ANXIETY 08/28/18  Yes Jaidon Ellery, Ines Bloomer, MD  FLUoxetine (PROZAC) 20 MG tablet Take 1 tablet (20 mg total) by mouth daily. 06/16/18  Yes Tomasa Dobransky, Ines Bloomer, MD  ibuprofen (ADVIL,MOTRIN) 600 MG tablet Take 1 tablet (600 mg total) by mouth every 8 (eight) hours as needed for moderate pain. 06/16/18  Yes Shirline Kendle, Ines Bloomer, MD  lisinopril (PRINIVIL,ZESTRIL) 10 MG tablet Take 1 tablet (10 mg total) by mouth daily. 04/22/18  Yes Horald Pollen, MD  simvastatin (ZOCOR) 40 MG tablet TAKE 1 TABLET(40 MG) BY MOUTH DAILY 04/22/18  Yes Horald Pollen, MD    Allergies  Allergen Reactions  . Hydrocodone Nausea And Vomiting    dizzy  . Mucinex Dm [Dm-Guaifenesin Er] Swelling    Sob, throat closing    Patient Active Problem List   Diagnosis Date Noted  . Chronic neck pain 12/17/2017  . Uncontrolled hypertension 12/17/2017  . Seasonal allergies 12/17/2017  . Depression 11/13/2016  . Chronic anxiety 11/13/2016  . Chronic sore throat 11/13/2016  . Hot flashes   . Hyperlipidemia     Past Medical  History:  Diagnosis Date  . Allergy    seasonal  . Anxiety   . Depression   . Hematuria   . Hot flashes   . Hx of cardiovascular stress test    Lex MV 12/13:  EF 68%, mild apical thinning, no ischemia  . Hyperlipidemia     Past Surgical History:  Procedure Laterality Date  . BREAST LUMPECTOMY     right breast, not cancer  . Carpel tunnel    . ECTOPIC PREGNANCY SURGERY      Social History   Socioeconomic History  . Marital status: Legally Separated    Spouse name: Not on file  . Number of children: 1  . Years of education: 8th grade  . Highest education level: Not on file  Occupational History    Employer: UNEMPLOYED  Social Needs  . Financial resource strain: Not hard at all  . Food insecurity    Worry: Never true    Inability: Never true  . Transportation needs    Medical: No    Non-medical: No  Tobacco Use  . Smoking status: Former Research scientist (life sciences)  . Smokeless tobacco: Never Used  Substance and Sexual Activity  . Alcohol use: No  . Drug use: No  . Sexual activity: Yes  Lifestyle  . Physical activity    Days per week: 0 days    Minutes per session: 0 min  . Stress: To some extent  Relationships  .  Social Musicianconnections    Talks on phone: Once a week    Gets together: Never    Attends religious service: Never    Active member of club or organization: No    Attends meetings of clubs or organizations: Never    Relationship status: Separated  . Intimate partner violence    Fear of current or ex partner: No    Emotionally abused: No    Physically abused: No    Forced sexual activity: No  Other Topics Concern  . Not on file  Social History Narrative  . Not on file    Family History  Problem Relation Age of Onset  . Heart disease Mother   . Diabetes Mother   . Esophageal cancer Father        9270's  . Colon cancer Neg Hx      Review of Systems  Constitutional: Negative.   HENT: Negative.  Negative for nosebleeds.   Eyes: Negative.  Negative for blurred  vision and double vision.  Respiratory: Negative.  Negative for shortness of breath.   Cardiovascular: Negative.  Negative for palpitations and leg swelling.  Gastrointestinal: Negative.  Negative for diarrhea.  Genitourinary: Negative.  Negative for dysuria and hematuria.  Musculoskeletal: Positive for joint pain.  Skin: Negative.   Neurological: Negative.  Negative for dizziness.  Endo/Heme/Allergies: Negative.   All other systems reviewed and are negative.  Vitals:   09/12/18 0921  BP: (!) 147/77  Pulse: 60  Resp: (!) 97  Temp: 98.7 F (37.1 C)     Physical Exam Vitals signs reviewed.  Constitutional:      Appearance: Normal appearance.  HENT:     Head: Normocephalic and atraumatic.     Nose: Nose normal.     Mouth/Throat:     Mouth: Mucous membranes are moist.     Pharynx: Oropharynx is clear.  Eyes:     Extraocular Movements: Extraocular movements intact.     Conjunctiva/sclera: Conjunctivae normal.     Pupils: Pupils are equal, round, and reactive to light.  Neck:     Musculoskeletal: Normal range of motion and neck supple.  Cardiovascular:     Rate and Rhythm: Normal rate and regular rhythm.     Pulses: Normal pulses.     Heart sounds: Normal heart sounds.  Pulmonary:     Effort: Pulmonary effort is normal.     Breath sounds: Normal breath sounds.  Musculoskeletal: Normal range of motion.  Skin:    General: Skin is warm and dry.     Capillary Refill: Capillary refill takes less than 2 seconds.  Neurological:     General: No focal deficit present.     Mental Status: She is alert and oriented to person, place, and time.  Psychiatric:        Mood and Affect: Mood normal.        Behavior: Behavior normal.      ASSESSMENT & PLAN: [name] was seen today for medication refill.  Diagnoses and all orders for this visit:  Essential hypertension -     CBC with Differential/Platelet -     Comprehensive metabolic panel -     Lipid panel  Hyperlipidemia,  unspecified hyperlipidemia type -     CBC with Differential/Platelet -     Comprehensive metabolic panel -     Lipid panel  Arthralgia of shoulder, unspecified laterality -     cyclobenzaprine (FLEXERIL) 5 MG tablet; Take 1 tablet (5 mg total) by mouth 3 (  three) times daily as needed for muscle spasms. -     ibuprofen (ADVIL) 600 MG tablet; Take 1 tablet (600 mg total) by mouth every 8 (eight) hours as needed for moderate pain.  Other orders -     loratadine (CLARITIN) 10 MG tablet; Take 1 tablet (10 mg total) by mouth daily.  A total of 25 minutes was spent in the room with the patient, greater than 50% of which was in counseling/coordination of care regarding chronic medical problems including hypertension and cardiovascular risks associated with it, diet and nutrition, medication and side effects, COVID-19 precautions, prognosis, need for blood work, and need for follow-up..     Patient Instructions       If you have lab work done today you will be contacted with your lab results within the next 2 weeks.  If you have not heard from [place] then please contact us. The fastest way to get your results is to register for My Chart.   IF you received an x-ray today, you will receive an invoice from [facility]. Please contact [facility] at [phone] with questions or concerns regarding your invoice.   IF you received labwork today, you will receive an invoice from [place]. Please contact [facility] at [phone] with questions or concerns regarding your invoice.   Our billing staff will not be able to assist you with questions regarding bills from these companies.  You will be contacted with the lab results as soon as they are available. The fastest way to get your results is to activate your My Chart account. Instructions are located on the last page of this paperwork. If you have not heard from [place] regarding the results in 2 weeks, please contact this office.      Health Maintenance, Female Adopting a healthy lifestyle and getting preventive care are important in promoting health and wellness. Ask your health care provider about:  The right schedule for you to have regular tests and exams.  Things you can do on your own to prevent diseases and keep yourself healthy. What should I know about diet, weight, and exercise? Eat a healthy diet   Eat a diet that includes plenty of vegetables, fruits, low-fat dairy products, and lean protein.  Do not eat a lot of foods that are high in solid fats, added sugars, or sodium. Maintain a healthy weight Body mass index (BMI) is used to identify weight problems. It estimates body fat based on height and weight. Your health care provider can help determine your BMI and help you achieve or maintain a healthy weight. Get regular exercise Get regular exercise. This is one of the most important things you can do for your health. Most adults should:  Exercise for at least 150 minutes each week. The exercise should increase your heart rate and make you sweat (moderate-intensity exercise).  Do strengthening exercises at least twice a week. This is in addition to the moderate-intensity exercise.  Spend less time sitting. Even light physical activity can be beneficial. Watch cholesterol and blood lipids Have your blood tested for lipids and cholesterol at 63 years of age, then have this test every 5 years. Have your cholesterol levels checked more often if:  Your lipid or cholesterol levels are high.  You are older than 63 years of age.  You are at high risk for heart disease. What should I know about cancer screening? Depending on your health history and family history, you may need to have cancer screening at various  ages. This may include screening for:  Breast cancer.  Cervical cancer.  Colorectal cancer.  Skin cancer.  Lung cancer. What should I know about heart disease, diabetes, and high blood  pressure? Blood pressure and heart disease  High blood pressure causes heart disease and increases the risk of stroke. This is more likely to develop in people who have high blood pressure readings, are of African descent, or are overweight.  Have your blood pressure checked: ? Every 3-5 years if you are 8418-63 years of age. ? Every year if you are 63 years old or older. Diabetes Have regular diabetes screenings. This checks your fasting blood sugar level. Have the screening done:  Once every three years after age 63 if you are at a normal weight and have a low risk for diabetes.  More often and at a younger age if you are overweight or have a high risk for diabetes. What should I know about preventing infection? Hepatitis B If you have a higher risk for hepatitis B, you should be screened for this virus. Talk with your health care provider to find out if you are at risk for hepatitis B infection. Hepatitis C Testing is recommended for:  Everyone born from [birth year] through 1965.  Anyone with known risk factors for hepatitis C. Sexually transmitted infections (STIs)  Get screened for STIs, including gonorrhea and chlamydia, if: ? You are sexually active and are younger than 63 years of age. ? You are older than 63 years of age and your health care provider tells you that you are at risk for this type of infection. ? Your sexual activity has changed since you were last screened, and you are at increased risk for chlamydia or gonorrhea. Ask your health care provider if you are at risk.  Ask your health care provider about whether you are at high risk for HIV. Your health care provider may recommend a prescription medicine to help prevent HIV infection. If you choose to take medicine to prevent HIV, you should first get tested for HIV. You should then be tested every 3 months for as long as you are taking the medicine. Pregnancy  If you are about to stop having your period (premenopausal) and  you may become pregnant, seek counseling before you get pregnant.  Take 400 to 800 micrograms (mcg) of folic acid every day if you become pregnant.  Ask for birth control (contraception) if you want to prevent pregnancy. Osteoporosis and menopause Osteoporosis is a disease in which the bones lose minerals and strength with aging. This can result in bone fractures. If you are 154 years old or older, or if you are at risk for osteoporosis and fractures, ask your health care provider if you should:  Be screened for bone loss.  Take a calcium or vitamin D supplement to lower your risk of fractures.  Be given hormone replacement therapy (HRT) to treat symptoms of menopause. Follow these instructions at home: Lifestyle  Do not use any products that contain nicotine or tobacco, such as cigarettes, e-cigarettes, and chewing tobacco. If you need help quitting, ask your health care provider.  Do not use street drugs.  Do not share needles.  Ask your health care provider for help if you need support or information about quitting drugs. Alcohol use  Do not drink alcohol if: ? Your health care provider tells you not to drink. ? You are pregnant, may be pregnant, or are planning to become pregnant.  If you drink alcohol: ?  Limit how much you use to 0-1 drink a day. ? Limit intake if you are breastfeeding.  Be aware of how much alcohol is in your drink. In the [place] one drink equals one 12 oz bottle of beer (355 mL), one 5 oz glass of wine (148 mL), or one 1 oz glass of hard liquor (44 mL). General instructions  Schedule regular health, dental, and eye exams.  Stay current with your vaccines.  Tell your health care provider if: ? You often feel depressed. ? You have ever been abused or do not feel safe at home. Summary  Adopting a healthy lifestyle and getting preventive care are important in promoting health and wellness.  Follow your health care provider's instructions about healthy  diet, exercising, and getting tested or screened for diseases.  Follow your health care provider's instructions on monitoring your cholesterol and blood pressure. This information is not intended to replace advice given to you by your health care provider. Make sure you discuss any questions you have with your health care provider. Document Released: 09/08/2010 Document Revised: 02/16/2018 Document Reviewed: 02/16/2018 Elsevier Patient Education  2020 Elsevier Inc.      Edwina BarthMiguel Jamel Holzmann, MD [facility]

## 2018-09-13 ENCOUNTER — Other Ambulatory Visit: Payer: Self-pay | Admitting: Emergency Medicine

## 2018-09-13 DIAGNOSIS — E785 Hyperlipidemia, unspecified: Secondary | ICD-10-CM

## 2018-09-13 LAB — COMPREHENSIVE METABOLIC PANEL
ALT: 23 IU/L (ref 0–32)
AST: 19 IU/L (ref 0–40)
Albumin/Globulin Ratio: 2 (ref 1.2–2.2)
Albumin: 4.2 g/dL (ref 3.8–4.8)
Alkaline Phosphatase: 93 IU/L (ref 39–117)
BUN/Creatinine Ratio: 31 — ABNORMAL HIGH (ref 12–28)
BUN: 22 mg/dL (ref 8–27)
Bilirubin Total: 0.3 mg/dL (ref 0.0–1.2)
CO2: 27 mmol/L (ref 20–29)
Calcium: 9.4 mg/dL (ref 8.7–10.3)
Chloride: 104 mmol/L (ref 96–106)
Creatinine, Ser: 0.72 mg/dL (ref 0.57–1.00)
GFR calc Af Amer: 104 mL/min/{1.73_m2} (ref >59)
GFR calc non Af Amer: 90 mL/min/{1.73_m2} (ref >59)
Globulin, Total: 2.1 g/dL (ref 1.5–4.5)
Glucose: 97 mg/dL (ref 65–99)
Potassium: 4.2 mmol/L (ref 3.5–5.2)
Sodium: 141 mmol/L (ref 134–144)
Total Protein: 6.3 g/dL (ref 6.0–8.5)

## 2018-09-13 LAB — CBC WITH DIFFERENTIAL/PLATELET
Basophils Absolute: 0 10*3/uL (ref 0.0–0.2)
Basos: 0 %
EOS (ABSOLUTE): 0 10*3/uL (ref 0.0–0.4)
Eos: 0 %
Hematocrit: 39.2 % (ref 34.0–46.6)
Hemoglobin: 13 g/dL (ref 11.1–15.9)
Immature Grans (Abs): 0 10*3/uL (ref 0.0–0.1)
Immature Granulocytes: 0 %
Lymphocytes Absolute: 2.1 10*3/uL (ref 0.7–3.1)
Lymphs: 42 %
MCH: 29 pg (ref 26.6–33.0)
MCHC: 33.2 g/dL (ref 31.5–35.7)
MCV: 88 fL (ref 79–97)
Monocytes Absolute: 0.5 10*3/uL (ref 0.1–0.9)
Monocytes: 9 %
Neutrophils Absolute: 2.3 10*3/uL (ref 1.4–7.0)
Neutrophils: 49 %
Platelets: 190 10*3/uL (ref 150–450)
RBC: 4.48 x10E6/uL (ref 3.77–5.28)
RDW: 12 % (ref 11.7–15.4)
WBC: 4.9 10*3/uL (ref 3.4–10.8)

## 2018-09-13 LAB — LIPID PANEL
Chol/HDL Ratio: 3.7 ratio (ref 0.0–4.4)
Cholesterol, Total: 235 mg/dL — ABNORMAL HIGH (ref 100–199)
HDL: 64 mg/dL (ref >39)
LDL Calculated: 150 mg/dL — ABNORMAL HIGH (ref 0–99)
Triglycerides: 105 mg/dL (ref 0–149)
VLDL Cholesterol Cal: 21 mg/dL (ref 5–40)

## 2018-09-13 MED ORDER — ROSUVASTATIN CALCIUM 10 MG PO TABS: 10.0000 mg | ORAL_TABLET | Freq: Every day | ORAL | 3 refills | Status: DC

## 2018-09-19 ENCOUNTER — Other Ambulatory Visit: Payer: Self-pay

## 2018-09-19 ENCOUNTER — Encounter: Payer: Self-pay | Admitting: Emergency Medicine

## 2018-09-19 ENCOUNTER — Ambulatory Visit (INDEPENDENT_AMBULATORY_CARE_PROVIDER_SITE_OTHER): Payer: 59 | Admitting: Emergency Medicine

## 2018-09-19 VITALS — BP 123/70 | HR 66 | Temp 98.4°F | Resp 16 | Wt 135.0 lb

## 2018-09-19 DIAGNOSIS — T7840XA Allergy, unspecified, initial encounter: Secondary | ICD-10-CM | POA: Diagnosis not present

## 2018-09-19 DIAGNOSIS — R21 Rash and other nonspecific skin eruption: Secondary | ICD-10-CM | POA: Diagnosis not present

## 2018-09-19 MED ORDER — DOXYCYCLINE HYCLATE 100 MG PO TABS: 100.0000 mg | ORAL_TABLET | Freq: Two times a day (BID) | ORAL | 0 refills | Status: AC

## 2018-09-19 MED ORDER — PREDNISONE 20 MG PO TABS: 40.0000 mg | ORAL_TABLET | Freq: Every day | ORAL | 0 refills | Status: AC

## 2018-09-19 NOTE — Patient Instructions (Addendum)
If you have lab work done today you will be contacted with your lab results within the next 2 weeks.  If you have not heard from [place] then please contact us. The fastest way to get your results is to register for My Chart.   IF you received an x-ray today, you will receive an invoice from [facility]. Please contact [facility] at [phone] with questions or concerns regarding your invoice.   IF you received labwork today, you will receive an invoice from [place]. Please contact [facility] at [phone] with questions or concerns regarding your invoice.   Our billing staff will not be able to assist you with questions regarding bills from these companies.  You will be contacted with the lab results as soon as they are available. The fastest way to get your results is to activate your My Chart account. Instructions are located on the last page of this paperwork. If you have not heard from [place] regarding the results in 2 weeks, please contact this office.     Contact Dermatitis Dermatitis is redness, soreness, and swelling (inflammation) of the skin. Contact dermatitis is a reaction to something that touches the skin. There are two types of contact dermatitis:  Irritant contact dermatitis. This happens when something bothers (irritates) your skin, like soap.  Allergic contact dermatitis. This is caused when you are exposed to something that you are allergic to, such as poison ivy. What are the causes?  Common causes of irritant contact dermatitis include: ? Makeup. ? Soaps. ? Detergents. ? Bleaches. ? Acids. ? Metals, such as nickel.  Common causes of allergic contact dermatitis include: ? Plants. ? Chemicals. ? Jewelry. ? Latex. ? Medicines. ? Preservatives in products, such as clothing. What increases the risk?  Having a job that exposes you to things that bother your skin.  Having asthma or eczema. What are the signs or symptoms? Symptoms may  happen anywhere the irritant has touched your skin. Symptoms include:  Dry or flaky skin.  Redness.  Cracks.  Itching.  Pain or a burning feeling.  Blisters.  Blood or clear fluid draining from skin cracks. With allergic contact dermatitis, swelling may occur. This may happen in places such as the eyelids, mouth, or genitals. How is this treated?  This condition is treated by checking for the cause of the reaction and protecting your skin. Treatment may also include: ? Steroid creams, ointments, or medicines. ? Antibiotic medicines or other ointments, if you have a skin infection. ? Lotion or medicines to help with itching. ? A bandage (dressing). Follow these instructions at home: Skin care  Moisturize your skin as needed.  Put cool cloths on your skin.  Put a baking soda paste on your skin. Stir water into baking soda until it looks like a paste.  Do not scratch your skin.  Avoid having things rub up against your skin.  Avoid the use of soaps, perfumes, and dyes. Medicines  Take or apply over-the-counter and prescription medicines only as told by your doctor.  If you were prescribed an antibiotic medicine, take or apply it as told by your doctor. Do not stop using it even if your condition starts to get better. Bathing  Take a bath with: ? Epsom salts. ? Baking soda. ? Colloidal oatmeal.  Bathe less often.  Bathe in warm water. Avoid using hot water. Bandage care  If you were given a bandage, change it as told by your health care provider.  Wash  your hands with soap and water before and after you change your bandage. If soap and water are not available, use hand sanitizer. General instructions  Avoid the things that caused your reaction. If you do not know what caused it, keep a journal. Write down: ? What you eat. ? What skin products you use. ? What you drink. ? What you wear in the area that has symptoms. This includes jewelry.  Check the affected  areas every day for signs of infection. Check for: ? More redness, swelling, or pain. ? More fluid or blood. ? Warmth. ? Pus or a bad smell.  Keep all follow-up visits as told by your doctor. This is important. Contact a doctor if:  You do not get better with treatment.  Your condition gets worse.  You have signs of infection, such as: ? More swelling. ? Tenderness. ? More redness. ? Soreness. ? Warmth.  You have a fever.  You have new symptoms. Get help right away if:  You have a very bad headache.  You have neck pain.  Your neck is stiff.  You throw up (vomit).  You feel very sleepy.  You see red streaks coming from the area.  Your bone or joint near the area hurts after the skin has healed.  The area turns darker.  You have trouble breathing. Summary  Dermatitis is redness, soreness, and swelling of the skin.  Symptoms may occur where the irritant has touched you.  Treatment may include medicines and skin care.  If you do not know what caused your reaction, keep a journal.  Contact a doctor if your condition gets worse or you have signs of infection. This information is not intended to replace advice given to you by your health care provider. Make sure you discuss any questions you have with your health care provider. Document Released: 12/21/2008 Document Revised: 06/15/2018 Document Reviewed: 09/08/2017 Elsevier Patient Education  2020 [employer].

## 2018-09-19 NOTE — Progress Notes (Signed)
Katie Gardner 63 y.o.   Chief Complaint  Patient presents with   Rash    ON BODY and bottom 09/18/2018    HISTORY OF PRESENT ILLNESS: This is a 63 y.o. female complaining of itchy rash that started 2 days ago, mostly on her back.  Denies difficulty breathing or any other associated symptomatology.  HPI   [name]   Medication Sig Start Date End Date Taking? Authorizing Provider  cyclobenzaprine (FLEXERIL) 5 MG tablet Take 1 tablet (5 mg total) by mouth 3 (three) times daily as needed for muscle spasms. 09/12/18  Yes Chanz Cahall, Eilleen KempfMiguel Jose, MD  diazepam (VALIUM) 5 MG tablet TAKE 1 TABLET BY MOUTH EVERY DAY AS NEEDED FOR ANXIETY 08/28/18  Yes Tameeka Luo, Eilleen KempfMiguel Jose, MD  FLUoxetine (PROZAC) 20 MG tablet Take 1 tablet (20 mg total) by mouth daily. 06/16/18  Yes Fidencia Mccloud, Eilleen KempfMiguel Jose, MD  ibuprofen (ADVIL) 600 MG tablet Take 1 tablet (600 mg total) by mouth every 8 (eight) hours as needed for moderate pain. 09/12/18  Yes Spring San, Eilleen KempfMiguel Jose, MD  lisinopril (PRINIVIL,ZESTRIL) 10 MG tablet Take 1 tablet (10 mg total) by mouth daily. 04/22/18  Yes Marcele Kosta, Eilleen KempfMiguel Jose, MD  loratadine (CLARITIN) 10 MG tablet Take 1 tablet (10 mg total) by mouth daily. 09/12/18  Yes Lonnie Rosado, Eilleen KempfMiguel Jose, MD  rosuvastatin (CRESTOR) 10 MG tablet Take 1 tablet (10 mg total) by mouth daily. 09/13/18  Yes Georgina QuintSagardia, Tylea Hise Jose, MD    Allergies  Allergen Reactions   Hydrocodone Nausea And Vomiting    dizzy   Mucinex Dm [Dm-Guaifenesin Er] Swelling    Sob, throat closing    Patient Active Problem List   Diagnosis Date Noted   Chronic neck pain 12/17/2017   Uncontrolled hypertension 12/17/2017   Seasonal allergies 12/17/2017   Depression 11/13/2016   Chronic anxiety 11/13/2016   Chronic sore throat 11/13/2016   Hot flashes    Hyperlipidemia     Past Medical History:  Diagnosis Date   Allergy    seasonal   Anxiety    Depression    Hematuria    Hot flashes    Hx of  cardiovascular stress test    Lex MV 12/13:  EF 68%, mild apical thinning, no ischemia   Hyperlipidemia     Past Surgical History:  Procedure Laterality Date   BREAST LUMPECTOMY     right breast, not cancer   Carpel tunnel     ECTOPIC PREGNANCY SURGERY      Social History   Socioeconomic History   Marital status: Legally Separated    Spouse name: Not on file   Number of children: 1   Years of education: 8th grade   Highest education level: Not on file  Occupational History    Employer: UNEMPLOYED  Social Network [employer]   Financial resource strain: Not hard at all   Food insecurity    Worry: Never true    Inability: Never true   Transportation needs    Medical: No    Non-medical: No  Tobacco Use   Smoking status: Former Smoker   Smokeless tobacco: Never Used  Substance and Sexual Activity   Alcohol use: No   Drug use: No   Sexual activity: Yes  Lifestyle   Physical activity    Days per week: 0 days    Minutes per session: 0 min   Stress: To some extent  Relationships   Social connections    Talks on phone: Once a week  Gets together: Never    Attends religious service: Never    Active member of club or organization: No    Attends meetings of clubs or organizations: Never    Relationship status: Separated   Intimate partner violence    Fear of current or ex partner: No    Emotionally abused: No    Physically abused: No    Forced sexual activity: No  Other Topics Concern   Not on file  Social History Narrative   Not on file    Family History  Problem Relation Age of Onset   Heart disease Mother    Diabetes Mother    Esophageal cancer Father        61's   Colon cancer Neg Hx      Review of Systems  Constitutional: Negative.  Negative for chills and fever.  HENT: Negative for congestion and sore throat.   Eyes: Negative.   Respiratory: Negative.  Negative for cough and shortness of breath.   Cardiovascular: Negative.  Negative  for chest pain and palpitations.  Gastrointestinal: Negative.  Negative for abdominal pain, nausea and vomiting.  Genitourinary: Negative.  Negative for dysuria and hematuria.  Skin: Positive for itching and rash.  Neurological: Negative.  Negative for dizziness and headaches.  All other systems reviewed and are negative.  Vitals:   09/19/18 1736  BP: 123/70  Pulse: 66  Resp: 16  Temp: 98.4 F (36.9 C)  SpO2: 97%     Physical Exam Constitutional:      Appearance: Normal appearance.  HENT:     Head: Normocephalic and atraumatic.     Mouth/Throat:     Mouth: Mucous membranes are moist.     Pharynx: Oropharynx is clear.  Eyes:     Extraocular Movements: Extraocular movements intact.     Conjunctiva/sclera: Conjunctivae normal.     Pupils: Pupils are equal, round, and reactive to light.  Neck:     Musculoskeletal: Normal range of motion and neck supple.  Cardiovascular:     Rate and Rhythm: Normal rate and regular rhythm.     Heart sounds: Normal heart sounds.  Pulmonary:     Effort: Pulmonary effort is normal.     Breath sounds: Normal breath sounds.  Abdominal:     Palpations: Abdomen is soft.     Tenderness: There is no abdominal tenderness.  Musculoskeletal: Normal range of motion.  Skin:    General: Skin is warm and dry.     Capillary Refill: Capillary refill takes less than 2 seconds.     Findings: Rash (Mostly lower back and buttocks with secondary infection) present.  Neurological:     General: No focal deficit present.     Mental Status: She is alert and oriented to person, place, and time.  Psychiatric:        Mood and Affect: Mood normal.        Behavior: Behavior normal.      ASSESSMENT & PLAN: Ramisa was seen today for rash.  Diagnoses and all orders for this visit:  Allergic reaction, initial encounter -     doxycycline (VIBRA-TABS) 100 MG tablet; Take 1 tablet (100 mg total) by mouth 2 (two) times daily for 7 days. -     predniSONE  (DELTASONE) 20 MG tablet; Take 2 tablets (40 mg total) by mouth daily with breakfast for 5 days.  Rash and nonspecific skin eruption    Patient Instructions       If you have lab work  done today you will be contacted with your lab results within the next 2 weeks.  If you have not heard from us then please contact us. The fastest way to get your results is to register for My Chart.   IF you received an x-ray today, you will receive an invoice from [facility]. Please contact [facility] at [phone] with questions or concerns regarding your invoice.   IF you received labwork today, you will receive an invoice from [facility]. Please contact [facility] at [phone] with questions or concerns regarding your invoice.   Our billing staff will not be able to assist you with questions regarding bills from these companies.  You will be contacted with the lab results as soon as they are available. The fastest way to get your results is to activate your My Chart account. Instructions are located on the last page of this paperwork. If you have not heard from us regarding the results in 2 weeks, please contact this office.     Contact Dermatitis Dermatitis is redness, soreness, and swelling (inflammation) of the skin. Contact dermatitis is a reaction to something that touches the skin. There are two types of contact dermatitis:  Irritant contact dermatitis. This happens when something bothers (irritates) your skin, like soap.  Allergic contact dermatitis. This is caused when you are exposed to something that you are allergic to, such as poison ivy. What are the causes?  Common causes of irritant contact dermatitis include: ? Makeup. ? Soaps. ? Detergents. ? Bleaches. ? Acids. ? Metals, such as nickel.  Common causes of allergic contact dermatitis include: ? Plants. ? Chemicals. ? Jewelry. ? Latex. ? Medicines. ? Preservatives in products, such as  clothing. What increases the risk?  Having a job that exposes you to things that bother your skin.  Having asthma or eczema. What are the signs or symptoms? Symptoms may happen anywhere the irritant has touched your skin. Symptoms include:  Dry or flaky skin.  Redness.  Cracks.  Itching.  Pain or a burning feeling.  Blisters.  Blood or clear fluid draining from skin cracks. With allergic contact dermatitis, swelling may occur. This may happen in places such as the eyelids, mouth, or genitals. How is this treated?  This condition is treated by checking for the cause of the reaction and protecting your skin. Treatment may also include: ? Steroid creams, ointments, or medicines. ? Antibiotic medicines or other ointments, if you have a skin infection. ? Lotion or medicines to help with itching. ? A bandage (dressing). Follow these instructions at home: Skin care  Moisturize your skin as needed.  Put cool cloths on your skin.  Put a baking soda paste on your skin. Stir water into baking soda until it looks like a paste.  Do not scratch your skin.  Avoid having things rub up against your skin.  Avoid the use of soaps, perfumes, and dyes. Medicines  Take or apply over-the-counter and prescription medicines only as told by your doctor.  If you were prescribed an antibiotic medicine, take or apply it as told by your doctor. Do not stop using it even if your condition starts to get better. Bathing  Take a bath with: ? Epsom salts. ? Baking soda. ? Colloidal oatmeal.  Bathe less often.  Bathe in warm water. Avoid using hot water. Bandage care  If you were given a bandage, change it as told by your health care provider.  Wash your hands with soap and water before and  after you change your bandage. If soap and water are not available, use hand sanitizer. General instructions  Avoid the things that caused your reaction. If you do not know what caused it, keep a  journal. Write down: ? What you eat. ? What skin products you use. ? What you drink. ? What you wear in the area that has symptoms. This includes jewelry.  Check the affected areas every day for signs of infection. Check for: ? More redness, swelling, or pain. ? More fluid or blood. ? Warmth. ? Pus or a bad smell.  Keep all follow-up visits as told by your doctor. This is important. Contact a doctor if:  You do not get better with treatment.  Your condition gets worse.  You have signs of infection, such as: ? More swelling. ? Tenderness. ? More redness. ? Soreness. ? Warmth.  You have a fever.  You have new symptoms. Get help right away if:  You have a very bad headache.  You have neck pain.  Your neck is stiff.  You throw up (vomit).  You feel very sleepy.  You see red streaks coming from the area.  Your bone or joint near the area hurts after the skin has healed.  The area turns darker.  You have trouble breathing. Summary  Dermatitis is redness, soreness, and swelling of the skin.  Symptoms may occur where the irritant has touched you.  Treatment may include medicines and skin care.  If you do not know what caused your reaction, keep a journal.  Contact a doctor if your condition gets worse or you have signs of infection. This information is not intended to replace advice given to you by your health care provider. Make sure you discuss any questions you have with your health care provider. Document Released: 12/21/2008 Document Revised: 06/15/2018 Document Reviewed: 09/08/2017 Elsevier Patient Education  2020 Elsevier Inc.      Edwina BarthMiguel Mahagony Grieb, MD [facility]

## 2018-09-22 ENCOUNTER — Other Ambulatory Visit: Payer: Self-pay | Admitting: Emergency Medicine

## 2018-09-22 DIAGNOSIS — M25519 Pain in unspecified shoulder: Secondary | ICD-10-CM

## 2018-09-22 NOTE — Telephone Encounter (Signed)
Forwarding medication refill to PCP for review. 

## 2018-10-02 ENCOUNTER — Other Ambulatory Visit: Payer: Self-pay | Admitting: Emergency Medicine

## 2018-10-02 DIAGNOSIS — M25519 Pain in unspecified shoulder: Secondary | ICD-10-CM

## 2018-10-02 NOTE — Telephone Encounter (Signed)
Requested Prescriptions  Pending Prescriptions Disp Refills  . ibuprofen (ADVIL) 600 MG tablet [Pharmacy Med Name: IBUPROFEN 600MG  TABLETS] 30 tablet 0    Sig: TAKE 1 TABLET(600 MG) BY MOUTH EVERY 8 HOURS AS NEEDED FOR MODERATE PAIN     Analgesics:  NSAIDS Passed - 10/02/2018  3:48 AM      Passed - Cr in normal range and within 360 days    Creatinine, Ser  Date Value Ref Range Status  09/12/2018 0.72 0.57 - 1.00 mg/dL Final         Passed - HGB in normal range and within 360 days    Hemoglobin  Date Value Ref Range Status  09/12/2018 13.0 11.1 - 15.9 g/dL Final         Passed - Patient is not pregnant      Passed - Valid encounter within last 12 months    Recent Outpatient Visits          1 week ago Allergic reaction, initial encounter   [facility] at [facility], Ines Bloomer, MD   2 weeks ago Essential hypertension   [facility] at [place], Ines Bloomer, MD   3 months ago Essential hypertension   [facility] at [place], Ines Bloomer, MD   3 months ago Essential hypertension   [facility] at [facility], Ines Bloomer, MD   3 months ago Need for prophylactic vaccination and inoculation against influenza   Primary Care at [facility], Ines Bloomer, MD      Future Appointments            In 5 months Griffin, Ines Bloomer, MD Primary Care at [place], [facility]

## 2018-10-11 ENCOUNTER — Other Ambulatory Visit: Payer: Self-pay | Admitting: Emergency Medicine

## 2018-10-11 DIAGNOSIS — M25519 Pain in unspecified shoulder: Secondary | ICD-10-CM

## 2018-10-26 ENCOUNTER — Encounter: Payer: Self-pay | Admitting: Emergency Medicine

## 2018-10-26 ENCOUNTER — Ambulatory Visit (INDEPENDENT_AMBULATORY_CARE_PROVIDER_SITE_OTHER): Payer: 59 | Admitting: Emergency Medicine

## 2018-10-26 ENCOUNTER — Other Ambulatory Visit: Payer: Self-pay

## 2018-10-26 VITALS — BP 135/71 | HR 84 | Temp 98.3°F | Ht 60.0 in | Wt 132.2 lb

## 2018-10-26 DIAGNOSIS — F418 Other specified anxiety disorders: Secondary | ICD-10-CM

## 2018-10-26 DIAGNOSIS — F419 Anxiety disorder, unspecified: Secondary | ICD-10-CM

## 2018-10-26 MED ORDER — DIAZEPAM 5 MG PO TABS: 5.0000 mg | ORAL_TABLET | Freq: Every day | ORAL | 1 refills | Status: DC | PRN

## 2018-10-26 NOTE — Patient Instructions (Addendum)
   If you have lab work done today you will be contacted with your lab results within the next 2 weeks.  If you have not heard from us then please contact us. The fastest way to get your results is to register for My Chart.   IF you received an x-ray today, you will receive an invoice from [facility]. Please contact [facility] at [phone] with questions or concerns regarding your invoice.   IF you received labwork today, you will receive an invoice from [facility]. Please contact [facility] at [phone] with questions or concerns regarding your invoice.   Our billing staff will not be able to assist you with questions regarding bills from these companies.  You will be contacted with the lab results as soon as they are available. The fastest way to get your results is to activate your My Chart account. Instructions are located on the last page of this paperwork. If you have not heard from us regarding the results in 2 weeks, please contact this office.     Living With Anxiety  After being diagnosed with an anxiety disorder, you may be relieved to know why you have felt or behaved a certain way. It is natural to also feel overwhelmed about the treatment ahead and what it will mean for your life. With care and support, you can manage this condition and recover from it. How to cope with anxiety Dealing with stress Stress is your body's reaction to life changes and events, both good and bad. Stress can last just a few hours or it can be ongoing. Stress can play a major role in anxiety, so it is important to learn both how to cope with stress and how to think about it differently. Talk with your health care provider or a counselor to learn more about stress reduction. He or she may suggest some stress reduction techniques, such as:  Music therapy. This can include creating or listening to music that you enjoy and that inspires you.  Mindfulness-based meditation. This  involves being aware of your normal breaths, rather than trying to control your breathing. It can be done while sitting or walking.  Centering prayer. This is a kind of meditation that involves focusing on a word, phrase, or sacred image that is meaningful to you and that brings you peace.  Deep breathing. To do this, expand your stomach and inhale slowly through your nose. Hold your breath for 3-5 seconds. Then exhale slowly, allowing your stomach muscles to relax.  Self-talk. This is a skill where you identify thought patterns that lead to anxiety reactions and correct those thoughts.  Muscle relaxation. This involves tensing muscles then relaxing them. Choose a stress reduction technique that fits your lifestyle and personality. Stress reduction techniques take time and practice. Set aside 5-15 minutes a day to do them. Therapists can offer training in these techniques. The training may be covered by some insurance plans. Other things you can do to manage stress include:  Keeping a stress diary. This can help you learn what triggers your stress and ways to control your response.  Thinking about how you respond to certain situations. You may not be able to control everything, but you can control your reaction.  Making time for activities that help you relax, and not feeling guilty about spending your time in this way. Therapy combined with coping and stress-reduction skills provides the best chance for successful treatment. Medicines Medicines can help ease symptoms. Medicines for anxiety include:    Anti-anxiety drugs.  Antidepressants.  Beta-blockers. Medicines may be used as the main treatment for anxiety disorder, along with therapy, or if other treatments are not working. Medicines should be prescribed by a health care provider. Relationships Relationships can play a big part in helping you recover. Try to spend more time connecting with trusted friends and family members. Consider  going to couples counseling, taking family education classes, or going to family therapy. Therapy can help you and others better understand the condition. How to recognize changes in your condition Everyone has a different response to treatment for anxiety. Recovery from anxiety happens when symptoms decrease and stop interfering with your daily activities at home or work. This may mean that you will start to:  Have better concentration and focus.  Sleep better.  Be less irritable.  Have more energy.  Have improved memory. It is important to recognize when your condition is getting worse. Contact your health care provider if your symptoms interfere with home or work and you do not feel like your condition is improving. Where to find help and support: You can get help and support from these sources:  Self-help groups.  Online and community organizations.  A trusted spiritual leader.  Couples counseling.  Family education classes.  Family therapy. Follow these instructions at home:  Eat a healthy diet that includes plenty of vegetables, fruits, whole grains, low-fat dairy products, and lean protein. Do not eat a lot of foods that are high in solid fats, added sugars, or salt.  Exercise. Most adults should do the following: ? Exercise for at least 150 minutes each week. The exercise should increase your heart rate and make you sweat (moderate-intensity exercise). ? Strengthening exercises at least twice a week.  Cut down on caffeine, tobacco, alcohol, and other potentially harmful substances.  Get the right amount and quality of sleep. Most adults need 7-9 hours of sleep each night.  Make choices that simplify your life.  Take over-the-counter and prescription medicines only as told by your health care provider.  Avoid caffeine, alcohol, and certain over-the-counter cold medicines. These may make you feel worse. Ask your pharmacist which medicines to avoid.  Keep all  follow-up visits as told by your health care provider. This is important. Questions to ask your health care provider  Would I benefit from therapy?  How often should I follow up with a health care provider?  How long do I need to take medicine?  Are there any long-term side effects of my medicine?  Are there any alternatives to taking medicine? Contact a health care provider if:  You have a hard time staying focused or finishing daily tasks.  You spend many hours a day feeling worried about everyday life.  You become exhausted by worry.  You start to have headaches, feel tense, or have nausea.  You urinate more than normal.  You have diarrhea. Get help right away if:  You have a racing heart and shortness of breath.  You have thoughts of hurting yourself or others. If you ever feel like you may hurt yourself or others, or have thoughts about taking your own life, get help right away. You can go to your nearest emergency department or call:  Your local emergency services (911 in the [place]  A suicide crisis helpline, such as the National Suicide Prevention Lifeline at 1-800-273-8255. This is open 24-hours a day. Summary  Taking steps to deal with stress can help calm you.  Medicines cannot cure anxiety disorders,   but they can help ease symptoms.  Family, friends, and partners can play a big part in helping you recover from an anxiety disorder. This information is not intended to replace advice given to you by your health care provider. Make sure you discuss any questions you have with your health care provider. Document Released: 02/18/2016 Document Revised: 02/05/2017 Document Reviewed: 02/18/2016 Elsevier Patient Education  2020 Elsevier Inc.  

## 2018-10-26 NOTE — Progress Notes (Signed)
Katie Gardner 63 y.o.   Chief Complaint  Patient presents with  . Stress  . left side of head    bump. day before yesterday     HISTORY OF PRESENT ILLNESS: This is a 63 y.o. female complaining of increased anxiety related to separation with husband.  Requesting medication.  Katie Gardner has a history of chronic anxiety.  Feels depressed.  She is on Prozac and Valium as needed. Bump her head 3 days ago when she was walking with her head down looking at her cell phone.  Bumped into hard surface without loss of consciousness or significant headache afterwards.  Much better today.  Not a concern. No other complaints or medical concerns today.   HPI   [name]   Medication Sig Start Date End Date Taking? Authorizing Provider  cyclobenzaprine (FLEXERIL) 5 MG tablet Take 1 tablet (5 mg total) by mouth 3 (three) times daily as needed for muscle spasms. 09/12/18  Yes Luster Hechler, Eilleen KempfMiguel Jose, MD  diazepam (VALIUM) 5 MG tablet TAKE 1 TABLET BY MOUTH EVERY DAY AS NEEDED FOR ANXIETY 08/28/18  Yes Adonnis Salceda, Eilleen KempfMiguel Jose, MD  ibuprofen (ADVIL) 600 MG tablet TAKE 1 TABLET(600 MG) BY MOUTH EVERY 8 HOURS AS NEEDED FOR MODERATE PAIN 10/02/18  Yes Mikhaela Zaugg, Eilleen KempfMiguel Jose, MD  lisinopril (PRINIVIL,ZESTRIL) 10 MG tablet Take 1 tablet (10 mg total) by mouth daily. 04/22/18  Yes Renella Steig, Eilleen KempfMiguel Jose, MD  loratadine (CLARITIN) 10 MG tablet Take 1 tablet (10 mg total) by mouth daily. 09/12/18  Yes Gevork Ayyad, Eilleen KempfMiguel Jose, MD  rosuvastatin (CRESTOR) 10 MG tablet Take 1 tablet (10 mg total) by mouth daily. 09/13/18  Yes Levi Crass, Eilleen KempfMiguel Jose, MD  FLUoxetine (PROZAC) 20 MG tablet Take 1 tablet (20 mg total) by mouth daily. Patient not taking: Reported on 10/26/2018 06/16/18   Georgina QuintSagardia, Aspynn Clover Jose, MD    Allergies  Allergen Reactions  . Hydrocodone Nausea And Vomiting    dizzy  . Mucinex Dm [Dm-Guaifenesin Er] Swelling    Sob, throat closing    Patient Active Problem List   Diagnosis Date Noted  .  Uncontrolled hypertension 12/17/2017  . Seasonal allergies 12/17/2017  . Depression 11/13/2016  . Chronic anxiety 11/13/2016  . Chronic sore throat 11/13/2016  . Hot flashes   . Hyperlipidemia     Past Medical History:  Diagnosis Date  . Allergy    seasonal  . Anxiety   . Depression   . Hematuria   . Hot flashes   . Hx of cardiovascular stress test    Lex MV 12/13:  EF 68%, mild apical thinning, no ischemia  . Hyperlipidemia     Past Surgical History:  Procedure Laterality Date  . BREAST LUMPECTOMY     right breast, not cancer  . Carpel tunnel    . ECTOPIC PREGNANCY SURGERY      Social History   Socioeconomic History  . Marital status: Legally Separated    Spouse name: Not on file  . Number of children: 1  . Years of education: 8th grade  . Highest education level: Not on file  Occupational History    Employer: UNEMPLOYED  Social Needs  . Financial resource strain: Not hard at all  . Food insecurity    Worry: Never true    Inability: Never true  . Transportation needs    Medical: No    Non-medical: No  Tobacco Use  . Smoking status: Former Games developermoker  . Smokeless tobacco: Never Used  Substance and Sexual Activity  .  Alcohol use: No  . Drug use: No  . Sexual activity: Yes  Lifestyle  . Physical activity    Days per week: 0 days    Minutes per session: 0 min  . Stress: To some extent  Relationships  . Social Musicianconnections    Talks on phone: Once a week    Gets together: Never    Attends religious service: Never    Active member of club or organization: No    Attends meetings of clubs or organizations: Never    Relationship status: Separated  . Intimate partner violence    Fear of current or ex partner: No    Emotionally abused: No    Physically abused: No    Forced sexual activity: No  Other Topics Concern  . Not on file  Social History Narrative  . Not on file    Family History  Problem Relation Age of Onset  . Heart disease Mother   .  Diabetes Mother   . Esophageal cancer Father        2870's  . Colon cancer Neg Hx      Review of Systems  Constitutional: Negative.  Negative for chills and fever.  HENT: Negative.   Eyes: Negative.  Negative for blurred vision and double vision.  Respiratory: Negative for shortness of breath.   Cardiovascular: Negative.  Negative for chest pain and palpitations.  Gastrointestinal: Negative for abdominal pain, diarrhea, nausea and vomiting.  Skin: Negative.  Negative for rash.  Neurological: Negative.  Negative for dizziness, speech change, focal weakness, seizures, loss of consciousness and headaches.  Psychiatric/Behavioral: Positive for depression. The patient is nervous/anxious.   All other systems reviewed and are negative.    Vitals:   10/26/18 1604  BP: 135/71  Pulse: 84  Temp: 98.3 F (36.8 C)  SpO2: 98%     Physical Exam Vitals signs reviewed.  Constitutional:      Appearance: Normal appearance.  HENT:     Head: Normocephalic and atraumatic.  Eyes:     Extraocular Movements: Extraocular movements intact.     Pupils: Pupils are equal, round, and reactive to light.  Neck:     Musculoskeletal: Normal range of motion.  Cardiovascular:     Rate and Rhythm: Normal rate and regular rhythm.     Heart sounds: Normal heart sounds.  Pulmonary:     Effort: Pulmonary effort is normal.     Breath sounds: Normal breath sounds.  Musculoskeletal: Normal range of motion.  Skin:    General: Skin is warm and dry.  Neurological:     General: No focal deficit present.     Mental Status: She is alert and oriented to person, place, and time.  Psychiatric:        Mood and Affect: Mood normal.        Behavior: Behavior normal.   A total of 25 minutes was spent in the room with the patient, greater than 50% of which was in counseling/coordination of care regarding chronic anxiety and ways of coping with it, medication side effects, prognosis, and need for follow-up.     ASSESSMENT & PLAN: Katie Gardner was seen today for stress and left side of head.  Diagnoses and all orders for this visit:  Situational anxiety  Chronic anxiety -     diazepam (VALIUM) 5 MG tablet; Take 1 tablet (5 mg total) by mouth daily as needed for anxiety.    Patient Instructions       If you have  lab work done today you will be contacted with your lab results within the next 2 weeks.  If you have not heard from us then please contact us. The fastest way to get your results is to register for My Chart.   IF you received an x-ray today, you will receive an invoice from [facility]. Please contact [facility] at [phone] with questions or concerns regarding your invoice.   IF you received labwork today, you will receive an invoice from [facility]. Please contact [facility] at [phone] with questions or concerns regarding your invoice.   Our billing staff will not be able to assist you with questions regarding bills from these companies.  You will be contacted with the lab results as soon as they are available. The fastest way to get your results is to activate your My Chart account. Instructions are located on the last page of this paperwork. If you have not heard from us regarding the results in 2 weeks, please contact this office.      Living With Anxiety  After being diagnosed with an anxiety disorder, you may be relieved to know why you have felt or behaved a certain way. It is natural to also feel overwhelmed about the treatment ahead and what it will mean for your life. With care and support, you can manage this condition and recover from it. How to cope with anxiety Dealing with stress Stress is your body's reaction to life changes and events, both good and bad. Stress can last just a few hours or it can be ongoing. Stress can play a major role in anxiety, so it is important to learn both how to cope with stress and how to think about it differently.  Talk with your health care provider or a counselor to learn more about stress reduction. He or she may suggest some stress reduction techniques, such as:  Music therapy. This can include creating or listening to music that you enjoy and that inspires you.  Mindfulness-based meditation. This involves being aware of your normal breaths, rather than trying to control your breathing. It can be done while sitting or walking.  Centering prayer. This is a kind of meditation that involves focusing on a word, phrase, or sacred image that is meaningful to you and that brings you peace.  Deep breathing. To do this, expand your stomach and inhale slowly through your nose. Hold your breath for 3-5 seconds. Then exhale slowly, allowing your stomach muscles to relax.  Self-talk. This is a skill where you identify thought patterns that lead to anxiety reactions and correct those thoughts.  Muscle relaxation. This involves tensing muscles then relaxing them. Choose a stress reduction technique that fits your lifestyle and personality. Stress reduction techniques take time and practice. Set aside 5-15 minutes a day to do them. Therapists can offer training in these techniques. The training may be covered by some insurance plans. Other things you can do to manage stress include:  Keeping a stress diary. This can help you learn what triggers your stress and ways to control your response.  Thinking about how you respond to certain situations. You may not be able to control everything, but you can control your reaction.  Making time for activities that help you relax, and not feeling guilty about spending your time in this way. Therapy combined with coping and stress-reduction skills provides the best chance for successful treatment. Medicines Medicines can help ease symptoms. Medicines for anxiety include:  Anti-anxiety drugs.  Antidepressants.  Beta-blockers. Medicines may be used as the main treatment for  anxiety disorder, along with therapy, or if other treatments are not working. Medicines should be prescribed by a health care provider. Relationships Relationships can play a big part in helping you recover. Try to spend more time connecting with trusted friends and family members. Consider going to couples counseling, taking family education classes, or going to family therapy. Therapy can help you and others better understand the condition. How to recognize changes in your condition Everyone has a different response to treatment for anxiety. Recovery from anxiety happens when symptoms decrease and stop interfering with your daily activities at home or work. This may mean that you will start to:  Have better concentration and focus.  Sleep better.  Be less irritable.  Have more energy.  Have improved memory. It is important to recognize when your condition is getting worse. Contact your health care provider if your symptoms interfere with home or work and you do not feel like your condition is improving. Where to find help and support: You can get help and support from these sources:  Self-help groups.  Online and [employer].  A trusted spiritual leader.  Couples counseling.  Family education classes.  Family therapy. Follow these instructions at home:  Eat a healthy diet that includes plenty of vegetables, fruits, whole grains, low-fat dairy products, and lean protein. Do not eat a lot of foods that are high in solid fats, added sugars, or salt.  Exercise. Most adults should do the following: ? Exercise for at least 150 minutes each week. The exercise should increase your heart rate and make you sweat (moderate-intensity exercise). ? Strengthening exercises at least twice a week.  Cut down on caffeine, tobacco, alcohol, and other potentially harmful substances.  Get the right amount and quality of sleep. Most adults need 7-9 hours of sleep each night.  Make  choices that simplify your life.  Take over-the-counter and prescription medicines only as told by your health care provider.  Avoid caffeine, alcohol, and certain over-the-counter cold medicines. These may make you feel worse. Ask your pharmacist which medicines to avoid.  Keep all follow-up visits as told by your health care provider. This is important. Questions to ask your health care provider  Would I benefit from therapy?  How often should I follow up with a health care provider?  How long do I need to take medicine?  Are there any long-term side effects of my medicine?  Are there any alternatives to taking medicine? Contact a health care provider if:  You have a hard time staying focused or finishing daily tasks.  You spend many hours a day feeling worried about everyday life.  You become exhausted by worry.  You start to have headaches, feel tense, or have nausea.  You urinate more than normal.  You have diarrhea. Get help right away if:  You have a racing heart and shortness of breath.  You have thoughts of hurting yourself or others. If you ever feel like you may hurt yourself or others, or have thoughts about taking your own life, get help right away. You can go to your nearest emergency department or call:  Your local emergency services (911 in the [place]  A suicide crisis helpline, such as the [facility] at (724)160-9890. This is open 24-hours a day. Summary  Taking steps to deal with stress can help calm you.  Medicines cannot cure anxiety disorders, but they can help ease  symptoms.  Family, friends, and partners can play a big part in helping you recover from an anxiety disorder. This information is not intended to replace advice given to you by your health care provider. Make sure you discuss any questions you have with your health care provider. Document Released: 02/18/2016 Document Revised: 02/05/2017 Document Reviewed:  02/18/2016 Elsevier Patient Education  2020 Elsevier Inc.      Edwina Barth, MD [facility]

## 2018-11-20 ENCOUNTER — Other Ambulatory Visit: Payer: Self-pay | Admitting: Emergency Medicine

## 2018-11-20 DIAGNOSIS — F32A Depression, unspecified: Secondary | ICD-10-CM

## 2018-11-20 DIAGNOSIS — F329 Major depressive disorder, single episode, unspecified: Secondary | ICD-10-CM

## 2018-11-21 NOTE — Telephone Encounter (Signed)
Requested medication (s) are due for refill today: no  Requested medication (s) are on the active medication list: yes  Future visit scheduled: yes  Notes to clinic:  Review for refill Patient report not taking at last visit    Requested Prescriptions  Pending Prescriptions Disp Refills   FLUoxetine (PROZAC) 20 MG capsule [Pharmacy Med Name: FLUOXETINE 20MG  CAPSULES] 30 capsule     Sig: TAKE 1 CAPSULE(20 MG) BY MOUTH DAILY     Psychiatry:  Antidepressants - SSRI Passed - 11/20/2018  3:51 AM      Passed - Valid encounter within last 6 months    Recent Outpatient Visits          3 weeks ago Situational anxiety   [facility] at [place], Ines Bloomer, MD   2 months ago Allergic reaction, initial encounter   Primary Care at [facility], Ines Bloomer, MD   2 months ago Essential hypertension   Primary Care at [facility], [place], [place]   3 months ago Uncontrolled hypertension   Primary Care at [facility], Ines Bloomer, MD   5 months ago Essential hypertension   Primary Care at [facility], Ines Bloomer, MD      Future Appointments            In 3 months Sagardia, Ines Bloomer, MD Primary Care at [place], [place] - Completed PHQ-2 or PHQ-9 in the last 360 days.

## 2018-12-22 ENCOUNTER — Other Ambulatory Visit: Payer: Self-pay | Admitting: Emergency Medicine

## 2018-12-22 DIAGNOSIS — M25519 Pain in unspecified shoulder: Secondary | ICD-10-CM

## 2018-12-29 ENCOUNTER — Other Ambulatory Visit: Payer: Self-pay | Admitting: Emergency Medicine

## 2018-12-29 DIAGNOSIS — M25519 Pain in unspecified shoulder: Secondary | ICD-10-CM

## 2018-12-29 NOTE — Telephone Encounter (Signed)
Forwarding medication refill request to the clinical pool for review. 

## 2018-12-29 NOTE — Telephone Encounter (Signed)
Called [facility] on [address], per [employer] (pharmacist) patient was contacted 12/23/2018 Rx ready for pick up.

## 2019-02-03 ENCOUNTER — Other Ambulatory Visit: Payer: Self-pay | Admitting: Emergency Medicine

## 2019-02-03 DIAGNOSIS — J302 Other seasonal allergic rhinitis: Secondary | ICD-10-CM

## 2019-02-03 NOTE — Telephone Encounter (Signed)
Requested medication (s) are due for refill today: no  Requested medication (s) are on the active medication list: no  Last refill:  06/01/2018  Future visit scheduled: yes  Notes to clinic: medication was discontinued  Requested Prescriptions  Pending Prescriptions Disp Refills   cetirizine (ZYRTEC) 10 MG tablet [Pharmacy Med Name: CETIRIZINE 10MG  TABLETS] 90 tablet 1    Sig: TAKE 1 TABLET(10 MG) BY MOUTH DAILY     Ear, Nose, and Throat:  Antihistamines Passed - 02/03/2019 11:00 AM      Passed - Valid encounter within last 12 months    Recent Outpatient Visits          3 months ago Situational anxiety   [facility] at [place], Ines Bloomer, MD   4 months ago Allergic reaction, initial encounter   Primary Care at [facility], Ines Bloomer, MD   4 months ago Essential hypertension   Primary Care at [facility], [place], [place]   6 months ago Uncontrolled hypertension   Primary Care at [facility], Ines Bloomer, MD   7 months ago Essential hypertension   Primary Care at [facility], Ines Bloomer, MD      Future Appointments            In 1 month Sagardia, Ines Bloomer, MD Primary Care at [place], [facility]

## 2019-02-18 ENCOUNTER — Other Ambulatory Visit: Payer: Self-pay | Admitting: Emergency Medicine

## 2019-02-18 DIAGNOSIS — F419 Anxiety disorder, unspecified: Secondary | ICD-10-CM

## 2019-02-18 DIAGNOSIS — F329 Major depressive disorder, single episode, unspecified: Secondary | ICD-10-CM

## 2019-02-18 DIAGNOSIS — F32A Depression, unspecified: Secondary | ICD-10-CM

## 2019-03-15 ENCOUNTER — Encounter: Payer: Self-pay | Admitting: Emergency Medicine

## 2019-03-15 ENCOUNTER — Other Ambulatory Visit: Payer: Self-pay

## 2019-03-15 ENCOUNTER — Ambulatory Visit (INDEPENDENT_AMBULATORY_CARE_PROVIDER_SITE_OTHER): Payer: 59 | Admitting: Emergency Medicine

## 2019-03-15 VITALS — BP 132/65 | HR 72 | Temp 98.2°F | Resp 16 | Ht 60.0 in | Wt 137.6 lb

## 2019-03-15 DIAGNOSIS — M7918 Myalgia, other site: Secondary | ICD-10-CM

## 2019-03-15 DIAGNOSIS — I10 Essential (primary) hypertension: Secondary | ICD-10-CM

## 2019-03-15 DIAGNOSIS — L918 Other hypertrophic disorders of the skin: Secondary | ICD-10-CM

## 2019-03-15 DIAGNOSIS — J312 Chronic pharyngitis: Secondary | ICD-10-CM

## 2019-03-15 DIAGNOSIS — Z23 Encounter for immunization: Secondary | ICD-10-CM | POA: Diagnosis not present

## 2019-03-15 DIAGNOSIS — E785 Hyperlipidemia, unspecified: Secondary | ICD-10-CM

## 2019-03-15 DIAGNOSIS — F329 Major depressive disorder, single episode, unspecified: Secondary | ICD-10-CM

## 2019-03-15 DIAGNOSIS — F5104 Psychophysiologic insomnia: Secondary | ICD-10-CM

## 2019-03-15 DIAGNOSIS — G8929 Other chronic pain: Secondary | ICD-10-CM

## 2019-03-15 DIAGNOSIS — F419 Anxiety disorder, unspecified: Secondary | ICD-10-CM

## 2019-03-15 NOTE — Progress Notes (Signed)
Katie Gardner 64 y.o.   Chief Complaint  Patient presents with  . Hypertension    follow up and chronic medical conditions  . Insomnia    x 6 months    HISTORY OF PRESENT ILLNESS: This is a 63 y.o. female with history of hypertension and dyslipidemia here for follow-up. 1.  Hypertension: On lisinopril 10 mg daily.  Doing well.  No complaints. 2.  Dyslipidemia: On Crestor 10 mg daily, started last summer. 3.  Chronic depression/anxiety.  Doing well.  On Prozac 20 mg daily and diazepam 5 mg as needed.  Doing well and not abusing benzodiazepine. 4.  Chronic insomnia: Melatonin helping. 5.  Chronic sore throat: Needs ENT evaluation. 6.  Multiple acquired skin tags: Needs dermatology evaluation. No other complaints or medical concerns today.  HPI   [name]   Medication Sig Start Date End Date Taking? Authorizing Provider  cyclobenzaprine (FLEXERIL) 5 MG tablet Take 1 tablet (5 mg total) by mouth 3 (three) times daily as needed for muscle spasms. 09/12/18  Yes Ramiah Helfrich, Eilleen Kempf, MD  diazepam (VALIUM) 5 MG tablet Take 1 tablet (5 mg total) by mouth daily as needed for anxiety. 10/26/18  Yes Georgina Quint, MD  FLUoxetine (PROZAC) 20 MG capsule TAKE 1 CAPSULE(20 MG) BY MOUTH DAILY 02/19/19  Yes Teneisha Gignac, Eilleen Kempf, MD  ibuprofen (ADVIL) 600 MG tablet TAKE 1 TABLET(600 MG) BY MOUTH EVERY 8 HOURS AS NEEDED FOR MODERATE PAIN 12/22/18  Yes Beverlie Kurihara, Eilleen Kempf, MD  lisinopril (PRINIVIL,ZESTRIL) 10 MG tablet Take 1 tablet (10 mg total) by mouth daily. 04/22/18  Yes Dayzha Pogosyan, Eilleen Kempf, MD  loratadine (CLARITIN) 10 MG tablet Take 1 tablet (10 mg total) by mouth daily. 09/12/18  Yes Toribio Seiber, Eilleen Kempf, MD  rosuvastatin (CRESTOR) 10 MG tablet Take 1 tablet (10 mg total) by mouth daily. 09/13/18  Yes Georgina Quint, MD    Allergies  Allergen Reactions  . Hydrocodone Nausea And Vomiting    dizzy  . Mucinex Dm [Dm-Guaifenesin Er] Swelling    Sob, throat  closing    Patient Active Problem List   Diagnosis Date Noted  . Uncontrolled hypertension 12/17/2017  . Seasonal allergies 12/17/2017  . Depression 11/13/2016  . Chronic anxiety 11/13/2016  . Chronic sore throat 11/13/2016  . Hot flashes   . Hyperlipidemia     Past Medical History:  Diagnosis Date  . Allergy    seasonal  . Anxiety   . Depression   . Hematuria   . Hot flashes   . Hx of cardiovascular stress test    Lex MV 12/13:  EF 68%, mild apical thinning, no ischemia  . Hyperlipidemia     Past Surgical History:  Procedure Laterality Date  . BREAST LUMPECTOMY     right breast, not cancer  . Carpel tunnel    . ECTOPIC PREGNANCY SURGERY      Social History   Socioeconomic History  . Marital status: Legally Separated    Spouse name: Not on file  . Number of children: 1  . Years of education: 8th grade  . Highest education level: Not on file  Occupational History    Employer: UNEMPLOYED  Tobacco Use  . Smoking status: Former Games developer  . Smokeless tobacco: Never Used  Substance and Sexual Activity  . Alcohol use: No  . Drug use: No  . Sexual activity: Yes  Other Topics Concern  . Not on file  Social History Narrative  . Not on file  Social Determinants of Health   Financial Resource Strain:   . Difficulty of Paying Living Expenses: Not on file  Food Insecurity:   . Worried About Programme researcher, broadcasting/film/videounning Out of Food in the Last Year: Not on file  . Ran Out of Food in the Last Year: Not on file  Transportation Needs:   . Lack of Transportation (Medical): Not on file  . Lack of Transportation (Non-Medical): Not on file  Physical Activity:   . Days of Exercise per Week: Not on file  . Minutes of Exercise per Session: Not on file  Stress:   . Feeling of Stress : Not on file  Social Connections:   . Frequency of Communication with Friends and Family: Not on file  . Frequency of Social Gatherings with Friends and Family: Not on file  . Attends Religious Services: Not  on file  . Active Member of Clubs or Organizations: Not on file  . Attends BankerClub or Organization Meetings: Not on file  . Marital Status: Not on file  Intimate Partner Violence:   . Fear of Current or Ex-Partner: Not on file  . Emotionally Abused: Not on file  . Physically Abused: Not on file  . Sexually Abused: Not on file    Family History  Problem Relation Age of Onset  . Heart disease Mother   . Diabetes Mother   . Esophageal cancer Father        7870's  . Colon cancer Neg Hx      Review of Systems  Constitutional: Negative.  Negative for chills and fever.  HENT: Positive for sore throat (Chronic sore throat). Negative for congestion.   Respiratory: Negative.  Negative for cough and shortness of breath.   Cardiovascular: Negative.  Negative for chest pain and palpitations.  Gastrointestinal: Negative.  Negative for abdominal pain, diarrhea, nausea and vomiting.  Genitourinary: Negative.  Negative for dysuria and hematuria.  Musculoskeletal: Positive for joint pain and neck pain.       Chronic musculoskeletal pain  Skin:       Multiple skin tags  Neurological: Negative.  Negative for dizziness, sensory change, speech change, focal weakness and headaches.  Psychiatric/Behavioral: Positive for depression (Chronic). Negative for suicidal ideas. The patient is nervous/anxious (Chronic) and has insomnia (Chronic).   All other systems reviewed and are negative.  Today's Vitals   03/15/19 1046  BP: 132/65  Pulse: 72  Resp: 16  Temp: 98.2 F (36.8 C)  TempSrc: Temporal  SpO2: 99%  Weight: 137 lb 9.6 oz (62.4 kg)  Height: 5' (1.524 m)   Body mass index is 26.87 kg/m.   Physical Exam Vitals reviewed.  Constitutional:      Appearance: Normal appearance.  HENT:     Head: Normocephalic.  Eyes:     Extraocular Movements: Extraocular movements intact.     Conjunctiva/sclera: Conjunctivae normal.     Pupils: Pupils are equal, round, and reactive to light.  Neck:      Vascular: No carotid bruit.  Cardiovascular:     Rate and Rhythm: Normal rate and regular rhythm.     Pulses: Normal pulses.     Heart sounds: Normal heart sounds.  Pulmonary:     Effort: Pulmonary effort is normal.     Breath sounds: Normal breath sounds.  Musculoskeletal:        General: Normal range of motion.     Cervical back: Normal range of motion and neck supple. No tenderness.  Lymphadenopathy:     Cervical: No  cervical adenopathy.  Skin:    General: Skin is warm and dry.     Capillary Refill: Capillary refill takes less than 2 seconds.  Neurological:     General: No focal deficit present.     Mental Status: She is alert and oriented to person, place, and time.  Psychiatric:        Mood and Affect: Mood normal.        Behavior: Behavior normal.     A total of 25 minutes was spent in the room with the patient, greater than 50% of which was in counseling/coordination of care regarding chronic medical problems, review of most recent blood work, review of medications and doses, review of sleep hygiene, diet and nutrition, health maintenance needs, prognosis and need for follow-up.  ASSESSMENT & PLAN: Albina was seen today for hypertension and insomnia.  Diagnoses and all orders for this visit:  Essential hypertension -     Comprehensive metabolic panel  Need for prophylactic vaccination and inoculation against influenza -     Flu Vaccine QUAD 36+ mos IM  Dyslipidemia -     Lipid panel  Chronic sore throat -     Ambulatory referral to ENT  Skin tags, multiple acquired -     Ambulatory referral to Dermatology  Anxiety and depression -     Ambulatory referral for Acupuncture  Chronic musculoskeletal pain -     Ambulatory referral for Acupuncture  Chronic insomnia    Patient Instructions       If you have lab work done today you will be contacted with your lab results within the next 2 weeks.  If you have not heard from [place] then please contact us. The  fastest way to get your results is to register for My Chart.   IF you received an x-ray today, you will receive an invoice from [facility]. Please contact [facility] at [phone] with questions or concerns regarding your invoice.   IF you received labwork today, you will receive an invoice from [place]. Please contact [facility] at [phone] with questions or concerns regarding your invoice.   Our billing staff will not be able to assist you with questions regarding bills from these companies.  You will be contacted with the lab results as soon as they are available. The fastest way to get your results is to activate your My Chart account. Instructions are located on the last page of this paperwork. If you have not heard from [place] regarding the results in 2 weeks, please contact this office.     Insomnia Insomnia is a sleep disorder that makes it difficult to fall asleep or stay asleep. Insomnia can cause fatigue, low energy, difficulty concentrating, mood swings, and poor performance at work or school. There are three different ways to classify insomnia:  Difficulty falling asleep.  Difficulty staying asleep.  Waking up too early in the morning. Any type of insomnia can be long-term (chronic) or short-term (acute). Both are common. Short-term insomnia usually lasts for three months or less. Chronic insomnia occurs at least three times a week for longer than three months. What are the causes? Insomnia may be caused by another condition, situation, or substance, such as:  Anxiety.  Certain medicines.  Gastroesophageal reflux disease (GERD) or other gastrointestinal conditions.  Asthma or other breathing conditions.  Restless legs syndrome, sleep apnea, or other sleep disorders.  Chronic pain.  Menopause.  Stroke.  Abuse of alcohol, tobacco, or illegal drugs.  Mental health conditions,  such as depression.  Caffeine.  Neurological disorders, such as  Alzheimer's disease.  An overactive thyroid (hyperthyroidism). Sometimes, the cause of insomnia may not be known. What increases the risk? Risk factors for insomnia include:  Gender. Women are affected more often than men.  Age. Insomnia is more common as you get older.  Stress.  Lack of exercise.  Irregular work schedule or working night shifts.  Traveling between different time zones.  Certain medical and mental health conditions. What are the signs or symptoms? If you have insomnia, the main symptom is having trouble falling asleep or having trouble staying asleep. This may lead to other symptoms, such as:  Feeling fatigued or having low energy.  Feeling nervous about going to sleep.  Not feeling rested in the morning.  Having trouble concentrating.  Feeling irritable, anxious, or depressed. How is this diagnosed? This condition may be diagnosed based on:  Your symptoms and medical history. Your health care provider may ask about: ? Your sleep habits. ? Any medical conditions you have. ? Your mental health.  A physical exam. How is this treated? Treatment for insomnia depends on the cause. Treatment may focus on treating an underlying condition that is causing insomnia. Treatment may also include:  Medicines to help you sleep.  Counseling or therapy.  Lifestyle adjustments to help you sleep better. Follow these instructions at home: Eating and drinking   Limit or avoid alcohol, caffeinated beverages, and cigarettes, especially close to bedtime. These can disrupt your sleep.  Do not eat a large meal or eat spicy foods right before bedtime. This can lead to digestive discomfort that can make it hard for you to sleep. Sleep habits   Keep a sleep diary to help you and your health care provider figure out what could be causing your insomnia. Write down: ? When you sleep. ? When you wake up during the night. ? How well you sleep. ? How rested you feel the  next day. ? Any side effects of medicines you are taking. ? What you eat and drink.  Make your bedroom a dark, comfortable place where it is easy to fall asleep. ? Put up shades or blackout curtains to block light from outside. ? Use a white noise machine to block noise. ? Keep the temperature cool.  Limit screen use before bedtime. This includes: ? Watching TV. ? Using your smartphone, tablet, or computer.  Stick to a routine that includes going to bed and waking up at the same times every day and night. This can help you fall asleep faster. Consider making a quiet activity, such as reading, part of your nighttime routine.  Try to avoid taking naps during the day so that you sleep better at night.  Get out of bed if you are still awake after 15 minutes of trying to sleep. Keep the lights down, but try reading or doing a quiet activity. When you feel sleepy, go back to bed. General instructions  Take over-the-counter and prescription medicines only as told by your health care provider.  Exercise regularly, as told by your health care provider. Avoid exercise starting several hours before bedtime.  Use relaxation techniques to manage stress. Ask your health care provider to suggest some techniques that may work well for you. These may include: ? Breathing exercises. ? Routines to release muscle tension. ? Visualizing peaceful scenes.  Make sure that you drive carefully. Avoid driving if you feel very sleepy.  Keep all follow-up visits as told by  your health care provider. This is important. Contact a health care provider if:  You are tired throughout the day.  You have trouble in your daily routine due to sleepiness.  You continue to have sleep problems, or your sleep problems get worse. Get help right away if:  You have serious thoughts about hurting yourself or someone else. If you ever feel like you may hurt yourself or others, or have thoughts about taking your own life,  get help right away. You can go to your nearest emergency department or call:  Your local emergency services (911 in the [place]  A suicide crisis helpline, such as the National Suicide Prevention Lifeline at (973)427-0405. This is open 24 hours a day. Summary  Insomnia is a sleep disorder that makes it difficult to fall asleep or stay asleep.  Insomnia can be long-term (chronic) or short-term (acute).  Treatment for insomnia depends on the cause. Treatment may focus on treating an underlying condition that is causing insomnia.  Keep a sleep diary to help you and your health care provider figure out what could be causing your insomnia. This information is not intended to replace advice given to you by your health care provider. Make sure you discuss any questions you have with your health care provider. Document Revised: 02/05/2017 Document Reviewed: 12/03/2016 Elsevier Patient Education  2020 [employer].  Health Maintenance, Female Adopting a healthy lifestyle and getting preventive care are important in promoting health and wellness. Ask your health care provider about:  The right schedule for you to have regular tests and exams.  Things you can do on your own to prevent diseases and keep yourself healthy. What should I know about diet, weight, and exercise? Eat a healthy diet   Eat a diet that includes plenty of vegetables, fruits, low-fat dairy products, and lean protein.  Do not eat a lot of foods that are high in solid fats, added sugars, or sodium. Maintain a healthy weight Body mass index (BMI) is used to identify weight problems. It estimates body fat based on height and weight. Your health care provider can help determine your BMI and help you achieve or maintain a healthy weight. Get regular exercise Get regular exercise. This is one of the most important things you can do for your health. Most adults should:  Exercise for at least 150 minutes each week. The exercise  should increase your heart rate and make you sweat (moderate-intensity exercise).  Do strengthening exercises at least twice a week. This is in addition to the moderate-intensity exercise.  Spend less time sitting. Even light physical activity can be beneficial. Watch cholesterol and blood lipids Have your blood tested for lipids and cholesterol at 64 years of age, then have this test every 5 years. Have your cholesterol levels checked more often if:  Your lipid or cholesterol levels are high.  You are older than 65 years of age.  You are at high risk for heart disease. What should I know about cancer screening? Depending on your health history and family history, you may need to have cancer screening at various ages. This may include screening for:  Breast cancer.  Cervical cancer.  Colorectal cancer.  Skin cancer.  Lung cancer. What should I know about heart disease, diabetes, and high blood pressure? Blood pressure and heart disease  High blood pressure causes heart disease and increases the risk of stroke. This is more likely to develop in people who have high blood pressure readings, are of African  descent, or are overweight.  Have your blood pressure checked: ? Every 3-5 years if you are 2518-64 years of age. ? Every year if you are 64 years old or older. Diabetes Have regular diabetes screenings. This checks your fasting blood sugar level. Have the screening done:  Once every three years after age 64 if you are at a normal weight and have a low risk for diabetes.  More often and at a younger age if you are overweight or have a high risk for diabetes. What should I know about preventing infection? Hepatitis B If you have a higher risk for hepatitis B, you should be screened for this virus. Talk with your health care provider to find out if you are at risk for hepatitis B infection. Hepatitis C Testing is recommended for:  Everyone born from [birth year] through  1965.  Anyone with known risk factors for hepatitis C. Sexually transmitted infections (STIs)  Get screened for STIs, including gonorrhea and chlamydia, if: ? You are sexually active and are younger than 64 years of age. ? You are older than 64 years of age and your health care provider tells you that you are at risk for this type of infection. ? Your sexual activity has changed since you were last screened, and you are at increased risk for chlamydia or gonorrhea. Ask your health care provider if you are at risk.  Ask your health care provider about whether you are at high risk for HIV. Your health care provider may recommend a prescription medicine to help prevent HIV infection. If you choose to take medicine to prevent HIV, you should first get tested for HIV. You should then be tested every 3 months for as long as you are taking the medicine. Pregnancy  If you are about to stop having your period (premenopausal) and you may become pregnant, seek counseling before you get pregnant.  Take 400 to 800 micrograms (mcg) of folic acid every day if you become pregnant.  Ask for birth control (contraception) if you want to prevent pregnancy. Osteoporosis and menopause Osteoporosis is a disease in which the bones lose minerals and strength with aging. This can result in bone fractures. If you are 64 years old or older, or if you are at risk for osteoporosis and fractures, ask your health care provider if you should:  Be screened for bone loss.  Take a calcium or vitamin D supplement to lower your risk of fractures.  Be given hormone replacement therapy (HRT) to treat symptoms of menopause. Follow these instructions at home: Lifestyle  Do not use any products that contain nicotine or tobacco, such as cigarettes, e-cigarettes, and chewing tobacco. If you need help quitting, ask your health care provider.  Do not use street drugs.  Do not share needles.  Ask your health care provider for  help if you need support or information about quitting drugs. Alcohol use  Do not drink alcohol if: ? Your health care provider tells you not to drink. ? You are pregnant, may be pregnant, or are planning to become pregnant.  If you drink alcohol: ? Limit how much you use to 0-1 drink a day. ? Limit intake if you are breastfeeding.  Be aware of how much alcohol is in your drink. In the [place] one drink equals one 12 oz bottle of beer (355 mL), one 5 oz glass of wine (148 mL), or one 1 oz glass of hard liquor (44 mL). General instructions  Schedule regular health, dental,  and eye exams.  Stay current with your vaccines.  Tell your health care provider if: ? You often feel depressed. ? You have ever been abused or do not feel safe at home. Summary  Adopting a healthy lifestyle and getting preventive care are important in promoting health and wellness.  Follow your health care provider's instructions about healthy diet, exercising, and getting tested or screened for diseases.  Follow your health care provider's instructions on monitoring your cholesterol and blood pressure. This information is not intended to replace advice given to you by your health care provider. Make sure you discuss any questions you have with your health care provider. Document Revised: 02/16/2018 Document Reviewed: 02/16/2018 Elsevier Patient Education  2020 [employer] Inc[employer]      Edwina Barth, MD [facility]

## 2019-03-15 NOTE — Patient Instructions (Addendum)
   If you have lab work done today you will be contacted with your lab results within the next 2 weeks.  If you have not heard from us then please contact us. The fastest way to get your results is to register for My Chart.   IF you received an x-ray today, you will receive an invoice from [facility]. Please contact [facility] at [phone] with questions or concerns regarding your invoice.   IF you received labwork today, you will receive an invoice from [facility]. Please contact [facility] at [phone] with questions or concerns regarding your invoice.   Our billing staff will not be able to assist you with questions regarding bills from these companies.  You will be contacted with the lab results as soon as they are available. The fastest way to get your results is to activate your My Chart account. Instructions are located on the last page of this paperwork. If you have not heard from us regarding the results in 2 weeks, please contact this office.     Insomnia Insomnia is a sleep disorder that makes it difficult to fall asleep or stay asleep. Insomnia can cause fatigue, low energy, difficulty concentrating, mood swings, and poor performance at work or school. There are three different ways to classify insomnia:  Difficulty falling asleep.  Difficulty staying asleep.  Waking up too early in the morning. Any type of insomnia can be long-term (chronic) or short-term (acute). Both are common. Short-term insomnia usually lasts for three months or less. Chronic insomnia occurs at least three times a week for longer than three months. What are the causes? Insomnia may be caused by another condition, situation, or substance, such as:  Anxiety.  Certain medicines.  Gastroesophageal reflux disease (GERD) or other gastrointestinal conditions.  Asthma or other breathing conditions.  Restless legs syndrome, sleep apnea, or other sleep disorders.  Chronic  pain.  Menopause.  Stroke.  Abuse of alcohol, tobacco, or illegal drugs.  Mental health conditions, such as depression.  Caffeine.  Neurological disorders, such as Alzheimer's disease.  An overactive thyroid (hyperthyroidism). Sometimes, the cause of insomnia may not be known. What increases the risk? Risk factors for insomnia include:  Gender. Women are affected more often than men.  Age. Insomnia is more common as you get older.  Stress.  Lack of exercise.  Irregular work schedule or working night shifts.  Traveling between different time zones.  Certain medical and mental health conditions. What are the signs or symptoms? If you have insomnia, the main symptom is having trouble falling asleep or having trouble staying asleep. This may lead to other symptoms, such as:  Feeling fatigued or having low energy.  Feeling nervous about going to sleep.  Not feeling rested in the morning.  Having trouble concentrating.  Feeling irritable, anxious, or depressed. How is this diagnosed? This condition may be diagnosed based on:  Your symptoms and medical history. Your health care provider may ask about: ? Your sleep habits. ? Any medical conditions you have. ? Your mental health.  A physical exam. How is this treated? Treatment for insomnia depends on the cause. Treatment may focus on treating an underlying condition that is causing insomnia. Treatment may also include:  Medicines to help you sleep.  Counseling or therapy.  Lifestyle adjustments to help you sleep better. Follow these instructions at home: Eating and drinking   Limit or avoid alcohol, caffeinated beverages, and cigarettes, especially close to bedtime. These can disrupt your sleep.    Do not eat a large meal or eat spicy foods right before bedtime. This can lead to digestive discomfort that can make it hard for you to sleep. Sleep habits   Keep a sleep diary to help you and your health care  provider figure out what could be causing your insomnia. Write down: ? When you sleep. ? When you wake up during the night. ? How well you sleep. ? How rested you feel the next day. ? Any side effects of medicines you are taking. ? What you eat and drink.  Make your bedroom a dark, comfortable place where it is easy to fall asleep. ? Put up shades or blackout curtains to block light from outside. ? Use a white noise machine to block noise. ? Keep the temperature cool.  Limit screen use before bedtime. This includes: ? Watching TV. ? Using your smartphone, tablet, or computer.  Stick to a routine that includes going to bed and waking up at the same times every day and night. This can help you fall asleep faster. Consider making a quiet activity, such as reading, part of your nighttime routine.  Try to avoid taking naps during the day so that you sleep better at night.  Get out of bed if you are still awake after 15 minutes of trying to sleep. Keep the lights down, but try reading or doing a quiet activity. When you feel sleepy, go back to bed. General instructions  Take over-the-counter and prescription medicines only as told by your health care provider.  Exercise regularly, as told by your health care provider. Avoid exercise starting several hours before bedtime.  Use relaxation techniques to manage stress. Ask your health care provider to suggest some techniques that may work well for you. These may include: ? Breathing exercises. ? Routines to release muscle tension. ? Visualizing peaceful scenes.  Make sure that you drive carefully. Avoid driving if you feel very sleepy.  Keep all follow-up visits as told by your health care provider. This is important. Contact a health care provider if:  You are tired throughout the day.  You have trouble in your daily routine due to sleepiness.  You continue to have sleep problems, or your sleep problems get worse. Get help right  away if:  You have serious thoughts about hurting yourself or someone else. If you ever feel like you may hurt yourself or others, or have thoughts about taking your own life, get help right away. You can go to your nearest emergency department or call:  Your local emergency services (911 in the [place]  A suicide crisis helpline, such as the National Suicide Prevention Lifeline at 450-264-8721. This is open 24 hours a day. Summary  Insomnia is a sleep disorder that makes it difficult to fall asleep or stay asleep.  Insomnia can be long-term (chronic) or short-term (acute).  Treatment for insomnia depends on the cause. Treatment may focus on treating an underlying condition that is causing insomnia.  Keep a sleep diary to help you and your health care provider figure out what could be causing your insomnia. This information is not intended to replace advice given to you by your health care provider. Make sure you discuss any questions you have with your health care provider. Document Revised: 02/05/2017 Document Reviewed: 12/03/2016 Elsevier Patient Education  2020 [employer].  Health Maintenance, Female Adopting a healthy lifestyle and getting preventive care are important in promoting health and wellness. Ask your health care provider about:  The right schedule for you to have regular tests and exams.  Things you can do on your own to prevent diseases and keep yourself healthy. What should I know about diet, weight, and exercise? Eat a healthy diet   Eat a diet that includes plenty of vegetables, fruits, low-fat dairy products, and lean protein.  Do not eat a lot of foods that are high in solid fats, added sugars, or sodium. Maintain a healthy weight Body mass index (BMI) is used to identify weight problems. It estimates body fat based on height and weight. Your health care provider can help determine your BMI and help you achieve or maintain a healthy weight. Get regular  exercise Get regular exercise. This is one of the most important things you can do for your health. Most adults should:  Exercise for at least 150 minutes each week. The exercise should increase your heart rate and make you sweat (moderate-intensity exercise).  Do strengthening exercises at least twice a week. This is in addition to the moderate-intensity exercise.  Spend less time sitting. Even light physical activity can be beneficial. Watch cholesterol and blood lipids Have your blood tested for lipids and cholesterol at 64 years of age, then have this test every 5 years. Have your cholesterol levels checked more often if:  Your lipid or cholesterol levels are high.  You are older than 64 years of age.  You are at high risk for heart disease. What should I know about cancer screening? Depending on your health history and family history, you may need to have cancer screening at various ages. This may include screening for:  Breast cancer.  Cervical cancer.  Colorectal cancer.  Skin cancer.  Lung cancer. What should I know about heart disease, diabetes, and high blood pressure? Blood pressure and heart disease  High blood pressure causes heart disease and increases the risk of stroke. This is more likely to develop in people who have high blood pressure readings, are of African descent, or are overweight.  Have your blood pressure checked: ? Every 3-5 years if you are 87-35 years of age. ? Every year if you are 68 years old or older. Diabetes Have regular diabetes screenings. This checks your fasting blood sugar level. Have the screening done:  Once every three years after age 65 if you are at a normal weight and have a low risk for diabetes.  More often and at a younger age if you are overweight or have a high risk for diabetes. What should I know about preventing infection? Hepatitis B If you have a higher risk for hepatitis B, you should be screened for this virus.  Talk with your health care provider to find out if you are at risk for hepatitis B infection. Hepatitis C Testing is recommended for:  Everyone born from [birth year] through 1965.  Anyone with known risk factors for hepatitis C. Sexually transmitted infections (STIs)  Get screened for STIs, including gonorrhea and chlamydia, if: ? You are sexually active and are younger than 64 years of age. ? You are older than 64 years of age and your health care provider tells you that you are at risk for this type of infection. ? Your sexual activity has changed since you were last screened, and you are at increased risk for chlamydia or gonorrhea. Ask your health care provider if you are at risk.  Ask your health care provider about whether you are at high risk for HIV. Your health care provider may  recommend a prescription medicine to help prevent HIV infection. If you choose to take medicine to prevent HIV, you should first get tested for HIV. You should then be tested every 3 months for as long as you are taking the medicine. Pregnancy  If you are about to stop having your period (premenopausal) and you may become pregnant, seek counseling before you get pregnant.  Take 400 to 800 micrograms (mcg) of folic acid every day if you become pregnant.  Ask for birth control (contraception) if you want to prevent pregnancy. Osteoporosis and menopause Osteoporosis is a disease in which the bones lose minerals and strength with aging. This can result in bone fractures. If you are 32 years old or older, or if you are at risk for osteoporosis and fractures, ask your health care provider if you should:  Be screened for bone loss.  Take a calcium or vitamin D supplement to lower your risk of fractures.  Be given hormone replacement therapy (HRT) to treat symptoms of menopause. Follow these instructions at home: Lifestyle  Do not use any products that contain nicotine or tobacco, such as cigarettes, e-cigarettes,  and chewing tobacco. If you need help quitting, ask your health care provider.  Do not use street drugs.  Do not share needles.  Ask your health care provider for help if you need support or information about quitting drugs. Alcohol use  Do not drink alcohol if: ? Your health care provider tells you not to drink. ? You are pregnant, may be pregnant, or are planning to become pregnant.  If you drink alcohol: ? Limit how much you use to 0-1 drink a day. ? Limit intake if you are breastfeeding.  Be aware of how much alcohol is in your drink. In the [place] one drink equals one 12 oz bottle of beer (355 mL), one 5 oz glass of wine (148 mL), or one 1 oz glass of hard liquor (44 mL). General instructions  Schedule regular health, dental, and eye exams.  Stay current with your vaccines.  Tell your health care provider if: ? You often feel depressed. ? You have ever been abused or do not feel safe at home. Summary  Adopting a healthy lifestyle and getting preventive care are important in promoting health and wellness.  Follow your health care provider's instructions about healthy diet, exercising, and getting tested or screened for diseases.  Follow your health care provider's instructions on monitoring your cholesterol and blood pressure. This information is not intended to replace advice given to you by your health care provider. Make sure you discuss any questions you have with your health care provider. Document Revised: 02/16/2018 Document Reviewed: 02/16/2018 Elsevier Patient Education  2020 [employer].

## 2019-03-16 ENCOUNTER — Encounter: Payer: Self-pay | Admitting: Emergency Medicine

## 2019-03-16 LAB — COMPREHENSIVE METABOLIC PANEL
ALT: 16 IU/L (ref 0–32)
AST: 18 IU/L (ref 0–40)
Albumin/Globulin Ratio: 1.8 (ref 1.2–2.2)
Albumin: 4.2 g/dL (ref 3.8–4.8)
Alkaline Phosphatase: 94 IU/L (ref 39–117)
BUN/Creatinine Ratio: 19 (ref 12–28)
BUN: 14 mg/dL (ref 8–27)
Bilirubin Total: 0.3 mg/dL (ref 0.0–1.2)
CO2: 23 mmol/L (ref 20–29)
Calcium: 9.4 mg/dL (ref 8.7–10.3)
Chloride: 104 mmol/L (ref 96–106)
Creatinine, Ser: 0.73 mg/dL (ref 0.57–1.00)
GFR calc Af Amer: 101 mL/min/{1.73_m2} (ref >59)
GFR calc non Af Amer: 88 mL/min/{1.73_m2} (ref >59)
Globulin, Total: 2.4 g/dL (ref 1.5–4.5)
Glucose: 109 mg/dL — ABNORMAL HIGH (ref 65–99)
Potassium: 4.2 mmol/L (ref 3.5–5.2)
Sodium: 141 mmol/L (ref 134–144)
Total Protein: 6.6 g/dL (ref 6.0–8.5)

## 2019-03-16 LAB — LIPID PANEL
Chol/HDL Ratio: 3.5 ratio (ref 0.0–4.4)
Cholesterol, Total: 224 mg/dL — ABNORMAL HIGH (ref 100–199)
HDL: 64 mg/dL (ref >39)
LDL Chol Calc (NIH): 128 mg/dL — ABNORMAL HIGH (ref 0–99)
Triglycerides: 183 mg/dL — ABNORMAL HIGH (ref 0–149)
VLDL Cholesterol Cal: 32 mg/dL (ref 5–40)

## 2019-03-30 ENCOUNTER — Encounter: Payer: Self-pay | Admitting: Emergency Medicine

## 2019-04-04 ENCOUNTER — Other Ambulatory Visit: Payer: Self-pay | Admitting: Emergency Medicine

## 2019-04-04 MED ORDER — ALPRAZOLAM 0.5 MG PO TABS: 0.5000 mg | ORAL_TABLET | Freq: Every day | ORAL | 1 refills | Status: DC | PRN

## 2019-04-04 NOTE — Progress Notes (Signed)
Prescription for alprazolam sent.

## 2019-04-25 ENCOUNTER — Other Ambulatory Visit: Payer: Self-pay | Admitting: Emergency Medicine

## 2019-04-25 DIAGNOSIS — M25519 Pain in unspecified shoulder: Secondary | ICD-10-CM

## 2019-04-25 MED ORDER — IBUPROFEN 600 MG PO TABS: 600.0000 mg | ORAL_TABLET | Freq: Every day | ORAL | 0 refills | Status: DC | PRN

## 2019-05-19 ENCOUNTER — Telehealth: Payer: Self-pay

## 2019-05-19 NOTE — Telephone Encounter (Signed)
Called and left message for pt to call back for clarification on the letter she's requesting

## 2019-05-23 ENCOUNTER — Other Ambulatory Visit: Payer: Self-pay | Admitting: Emergency Medicine

## 2019-05-23 DIAGNOSIS — F419 Anxiety disorder, unspecified: Secondary | ICD-10-CM

## 2019-05-23 DIAGNOSIS — F329 Major depressive disorder, single episode, unspecified: Secondary | ICD-10-CM

## 2019-05-23 MED ORDER — FLUOXETINE HCL 20 MG PO CAPS: 20.0000 mg | ORAL_CAPSULE | Freq: Every day | ORAL | 1 refills | Status: DC

## 2019-05-24 NOTE — Telephone Encounter (Signed)
I have called pt back and she stated that she wanted a letter stating that she has depression dx. This letter is for her own records. I have pended the letter.   Please advise if it is okay to send

## 2019-05-24 NOTE — Telephone Encounter (Signed)
Letter for court , her husband left her and she is depressed and she would like to medically show that she is not doing well

## 2019-05-27 NOTE — Telephone Encounter (Signed)
OK to send. Thanks.

## 2019-05-29 NOTE — Telephone Encounter (Signed)
Letter has been sent to pt mychart.  

## 2019-05-31 ENCOUNTER — Ambulatory Visit (INDEPENDENT_AMBULATORY_CARE_PROVIDER_SITE_OTHER): Payer: 59 | Admitting: Emergency Medicine

## 2019-05-31 ENCOUNTER — Other Ambulatory Visit: Payer: Self-pay

## 2019-05-31 ENCOUNTER — Encounter: Payer: Self-pay | Admitting: Emergency Medicine

## 2019-05-31 VITALS — BP 128/74 | HR 60 | Temp 98.2°F | Ht 60.0 in | Wt 140.0 lb

## 2019-05-31 DIAGNOSIS — F418 Other specified anxiety disorders: Secondary | ICD-10-CM | POA: Diagnosis not present

## 2019-05-31 DIAGNOSIS — F5104 Psychophysiologic insomnia: Secondary | ICD-10-CM | POA: Diagnosis not present

## 2019-05-31 MED ORDER — TRAZODONE HCL 50 MG PO TABS: 50.0000 mg | ORAL_TABLET | Freq: Every evening | ORAL | 3 refills | Status: DC | PRN

## 2019-05-31 NOTE — Progress Notes (Signed)
Katie Gardner 64 y.o.   Chief Complaint  Patient presents with  . Insomnia    7 months. since the seperation   . Stress    7 months. since the seperation     HISTORY OF PRESENT ILLNESS: This is a 64 y.o. female complaining of insomnia for the past 7 months since she and her husband separated after 25 years of marriage. Has been having chronic anxiety on and off since.  Presently taking Prozac 20 mg daily and alprazolam 0.5 mg as needed. Has tried melatonin with little success.  Unable to get more than 4 hours of sleep. No other complaints or medical concerns.  HPI   [name]   Medication Sig Start Date End Date Taking? Authorizing Provider  ALPRAZolam Prudy Feeler) 0.5 MG tablet Take 1 tablet (0.5 mg total) by mouth daily as needed for anxiety. 04/04/19  Yes Jazzma Neidhardt, Eilleen Kempf, MD  cyclobenzaprine (FLEXERIL) 5 MG tablet Take 1 tablet (5 mg total) by mouth 3 (three) times daily as needed for muscle spasms. 09/12/18  Yes Mckinnley Cottier, Eilleen Kempf, MD  FLUoxetine (PROZAC) 20 MG capsule Take 1 capsule (20 mg total) by mouth daily. 05/23/19 08/21/19 Yes Hairo Garraway, Eilleen Kempf, MD  ibuprofen (ADVIL) 600 MG tablet Take 1 tablet (600 mg total) by mouth daily as needed for moderate pain. 04/25/19  Yes Loreda Silverio, Eilleen Kempf, MD  lisinopril (PRINIVIL,ZESTRIL) 10 MG tablet Take 1 tablet (10 mg total) by mouth daily. 04/22/18  Yes Hendry Speas, Eilleen Kempf, MD  loratadine (CLARITIN) 10 MG tablet Take 1 tablet (10 mg total) by mouth daily. 09/12/18  Yes Illana Nolting, Eilleen Kempf, MD  rosuvastatin (CRESTOR) 10 MG tablet Take 1 tablet (10 mg total) by mouth daily. 09/13/18  Yes Georgina Quint, MD    Allergies  Allergen Reactions  . Hydrocodone Nausea And Vomiting    dizzy  . Mucinex Dm [Dm-Guaifenesin Er] Swelling    Sob, throat closing    Patient Active Problem List   Diagnosis Date Noted  . Uncontrolled hypertension 12/17/2017  . Seasonal allergies 12/17/2017  . Depression 11/13/2016    . Chronic anxiety 11/13/2016  . Chronic sore throat 11/13/2016  . Hot flashes   . Hyperlipidemia     Past Medical History:  Diagnosis Date  . Allergy    seasonal  . Anxiety   . Depression   . Hematuria   . Hot flashes   . Hx of cardiovascular stress test    Lex MV 12/13:  EF 68%, mild apical thinning, no ischemia  . Hyperlipidemia     Past Surgical History:  Procedure Laterality Date  . BREAST LUMPECTOMY     right breast, not cancer  . Carpel tunnel    . ECTOPIC PREGNANCY SURGERY      Social History   Socioeconomic History  . Marital status: Legally Separated    Spouse name: Not on file  . Number of children: 1  . Years of education: 8th grade  . Highest education level: Not on file  Occupational History    Employer: UNEMPLOYED  Tobacco Use  . Smoking status: Former Games developer  . Smokeless tobacco: Never Used  Substance and Sexual Activity  . Alcohol use: No  . Drug use: No  . Sexual activity: Yes  Other Topics Concern  . Not on file  Social History Narrative  . Not on file   Social Determinants of Health   Financial Resource Strain:   . Difficulty of Paying Living Expenses:   Food  Insecurity:   . Worried About Charity fundraiser in the Last Year:   . Arboriculturist in the Last Year:   Transportation Needs:   . Film/video editor (Medical):   Marland Kitchen Lack of Transportation (Non-Medical):   Physical Activity:   . Days of Exercise per Week:   . Minutes of Exercise per Session:   Stress:   . Feeling of Stress :   Social Connections:   . Frequency of Communication with Friends and Family:   . Frequency of Social Gatherings with Friends and Family:   . Attends Religious Services:   . Active Member of Clubs or Organizations:   . Attends Archivist Meetings:   Marland Kitchen Marital Status:   Intimate Partner Violence:   . Fear of Current or Ex-Partner:   . Emotionally Abused:   Marland Kitchen Physically Abused:   . Sexually Abused:     Family History  Problem  Relation Age of Onset  . Heart disease Mother   . Diabetes Mother   . Esophageal cancer Father        28's  . Colon cancer Neg Hx      Review of Systems  Constitutional: Negative.  Negative for chills and fever.  HENT: Negative.  Negative for congestion and sore throat.   Respiratory: Negative.  Negative for cough and shortness of breath.   Cardiovascular: Negative.  Negative for chest pain and palpitations.  Gastrointestinal: Negative.  Negative for abdominal pain, diarrhea, nausea and vomiting.  Genitourinary: Negative.  Negative for dysuria and hematuria.  Musculoskeletal: Negative.  Negative for back pain, myalgias and neck pain.  Skin: Negative.  Negative for rash.  Neurological: Negative.  Negative for dizziness and headaches.  Psychiatric/Behavioral: The patient has insomnia.   All other systems reviewed and are negative.  Today's Vitals   05/31/19 1004 05/31/19 1015  BP: (!) 151/80 128/74  Pulse: 60   Temp: 98.2 F (36.8 C)   TempSrc: Temporal   SpO2: 96%   Weight: 140 lb (63.5 kg)   Height: 5' (1.524 m)    Body mass index is 27.34 kg/m.   Physical Exam Vitals reviewed.  Constitutional:      Appearance: Normal appearance.  HENT:     Head: Normocephalic.  Eyes:     Extraocular Movements: Extraocular movements intact.     Pupils: Pupils are equal, round, and reactive to light.  Cardiovascular:     Rate and Rhythm: Normal rate and regular rhythm.     Pulses: Normal pulses.     Heart sounds: Normal heart sounds.  Pulmonary:     Effort: Pulmonary effort is normal.     Breath sounds: Normal breath sounds.  Musculoskeletal:        General: Normal range of motion.     Cervical back: Normal range of motion and neck supple.  Skin:    General: Skin is warm and dry.     Capillary Refill: Capillary refill takes less than 2 seconds.  Neurological:     General: No focal deficit present.     Mental Status: She is alert and oriented to person, place, and time.       ASSESSMENT & PLAN: Clarke was seen today for insomnia and stress.  Diagnoses and all orders for this visit:  Chronic insomnia -     traZODone (DESYREL) 50 MG tablet; Take 1 tablet (50 mg total) by mouth at bedtime as needed for sleep.  Situational anxiety    Patient  Instructions       If you have lab work done today you will be contacted with your lab results within the next 2 weeks.  If you have not heard from [place] then please contact us. The fastest way to get your results is to register for My Chart.   IF you received an x-ray today, you will receive an invoice from [facility]. Please contact [facility] at [phone] with questions or concerns regarding your invoice.   IF you received labwork today, you will receive an invoice from [facility]. Please contact [facility] at [phone] with questions or concerns regarding your invoice.   Our billing staff will not be able to assist you with questions regarding bills from these companies.  You will be contacted with the lab results as soon as they are available. The fastest way to get your results is to activate your My Chart account. Instructions are located on the last page of this paperwork. If you have not heard from [place] regarding the results in 2 weeks, please contact this office.      Insomnia Insomnia is a sleep disorder that makes it difficult to fall asleep or stay asleep. Insomnia can cause fatigue, low energy, difficulty concentrating, mood swings, and poor performance at work or school. There are three different ways to classify insomnia:  Difficulty falling asleep.  Difficulty staying asleep.  Waking up too early in the morning. Any type of insomnia can be long-term (chronic) or short-term (acute). Both are common. Short-term insomnia usually lasts for three months or less. Chronic insomnia occurs at least three times a week for longer than three months. What are the causes? Insomnia  may be caused by another condition, situation, or substance, such as:  Anxiety.  Certain medicines.  Gastroesophageal reflux disease (GERD) or other gastrointestinal conditions.  Asthma or other breathing conditions.  Restless legs syndrome, sleep apnea, or other sleep disorders.  Chronic pain.  Menopause.  Stroke.  Abuse of alcohol, tobacco, or illegal drugs.  Mental health conditions, such as depression.  Caffeine.  Neurological disorders, such as Alzheimer's disease.  An overactive thyroid (hyperthyroidism). Sometimes, the cause of insomnia may not be known. What increases the risk? Risk factors for insomnia include:  Gender. Women are affected more often than men.  Age. Insomnia is more common as you get older.  Stress.  Lack of exercise.  Irregular work schedule or working night shifts.  Traveling between different time zones.  Certain medical and mental health conditions. What are the signs or symptoms? If you have insomnia, the main symptom is having trouble falling asleep or having trouble staying asleep. This may lead to other symptoms, such as:  Feeling fatigued or having low energy.  Feeling nervous about going to sleep.  Not feeling rested in the morning.  Having trouble concentrating.  Feeling irritable, anxious, or depressed. How is this diagnosed? This condition may be diagnosed based on:  Your symptoms and medical history. Your health care provider may ask about: ? Your sleep habits. ? Any medical conditions you have. ? Your mental health.  A physical exam. How is this treated? Treatment for insomnia depends on the cause. Treatment may focus on treating an underlying condition that is causing insomnia. Treatment may also include:  Medicines to help you sleep.  Counseling or therapy.  Lifestyle adjustments to help you sleep better. Follow these instructions at home: Eating and drinking   Limit or avoid alcohol, caffeinated  beverages, and cigarettes, especially close to bedtime.  These can disrupt your sleep.  Do not eat a large meal or eat spicy foods right before bedtime. This can lead to digestive discomfort that can make it hard for you to sleep. Sleep habits   Keep a sleep diary to help you and your health care provider figure out what could be causing your insomnia. Write down: ? When you sleep. ? When you wake up during the night. ? How well you sleep. ? How rested you feel the next day. ? Any side effects of medicines you are taking. ? What you eat and drink.  Make your bedroom a dark, comfortable place where it is easy to fall asleep. ? Put up shades or blackout curtains to block light from outside. ? Use a white noise machine to block noise. ? Keep the temperature cool.  Limit screen use before bedtime. This includes: ? Watching TV. ? Using your smartphone, tablet, or computer.  Stick to a routine that includes going to bed and waking up at the same times every day and night. This can help you fall asleep faster. Consider making a quiet activity, such as reading, part of your nighttime routine.  Try to avoid taking naps during the day so that you sleep better at night.  Get out of bed if you are still awake after 15 minutes of trying to sleep. Keep the lights down, but try reading or doing a quiet activity. When you feel sleepy, go back to bed. General instructions  Take over-the-counter and prescription medicines only as told by your health care provider.  Exercise regularly, as told by your health care provider. Avoid exercise starting several hours before bedtime.  Use relaxation techniques to manage stress. Ask your health care provider to suggest some techniques that may work well for you. These may include: ? Breathing exercises. ? Routines to release muscle tension. ? Visualizing peaceful scenes.  Make sure that you drive carefully. Avoid driving if you feel very sleepy.  Keep  all follow-up visits as told by your health care provider. This is important. Contact a health care provider if:  You are tired throughout the day.  You have trouble in your daily routine due to sleepiness.  You continue to have sleep problems, or your sleep problems get worse. Get help right away if:  You have serious thoughts about hurting yourself or someone else. If you ever feel like you may hurt yourself or others, or have thoughts about taking your own life, get help right away. You can go to your nearest emergency department or call:  Your local emergency services (911 in the [place]  A suicide crisis helpline, such as the National Suicide Prevention Lifeline at 360-159-4414. This is open 24 hours a day. Summary  Insomnia is a sleep disorder that makes it difficult to fall asleep or stay asleep.  Insomnia can be long-term (chronic) or short-term (acute).  Treatment for insomnia depends on the cause. Treatment may focus on treating an underlying condition that is causing insomnia.  Keep a sleep diary to help you and your health care provider figure out what could be causing your insomnia. This information is not intended to replace advice given to you by your health care provider. Make sure you discuss any questions you have with your health care provider. Document Revised: 02/05/2017 Document Reviewed: 12/03/2016 Elsevier Patient Education  2020 Elsevier Inc.      Edwina Barth, MD [facility]

## 2019-05-31 NOTE — Patient Instructions (Addendum)
   If you have lab work done today you will be contacted with your lab results within the next 2 weeks.  If you have not heard from us then please contact us. The fastest way to get your results is to register for My Chart.   IF you received an x-ray today, you will receive an invoice from [facility]. Please contact [facility] at [phone] with questions or concerns regarding your invoice.   IF you received labwork today, you will receive an invoice from [facility]. Please contact [facility] at [phone] with questions or concerns regarding your invoice.   Our billing staff will not be able to assist you with questions regarding bills from these companies.  You will be contacted with the lab results as soon as they are available. The fastest way to get your results is to activate your My Chart account. Instructions are located on the last page of this paperwork. If you have not heard from us regarding the results in 2 weeks, please contact this office.     Insomnia Insomnia is a sleep disorder that makes it difficult to fall asleep or stay asleep. Insomnia can cause fatigue, low energy, difficulty concentrating, mood swings, and poor performance at work or school. There are three different ways to classify insomnia:  Difficulty falling asleep.  Difficulty staying asleep.  Waking up too early in the morning. Any type of insomnia can be long-term (chronic) or short-term (acute). Both are common. Short-term insomnia usually lasts for three months or less. Chronic insomnia occurs at least three times a week for longer than three months. What are the causes? Insomnia may be caused by another condition, situation, or substance, such as:  Anxiety.  Certain medicines.  Gastroesophageal reflux disease (GERD) or other gastrointestinal conditions.  Asthma or other breathing conditions.  Restless legs syndrome, sleep apnea, or other sleep disorders.  Chronic  pain.  Menopause.  Stroke.  Abuse of alcohol, tobacco, or illegal drugs.  Mental health conditions, such as depression.  Caffeine.  Neurological disorders, such as Alzheimer's disease.  An overactive thyroid (hyperthyroidism). Sometimes, the cause of insomnia may not be known. What increases the risk? Risk factors for insomnia include:  Gender. Women are affected more often than men.  Age. Insomnia is more common as you get older.  Stress.  Lack of exercise.  Irregular work schedule or working night shifts.  Traveling between different time zones.  Certain medical and mental health conditions. What are the signs or symptoms? If you have insomnia, the main symptom is having trouble falling asleep or having trouble staying asleep. This may lead to other symptoms, such as:  Feeling fatigued or having low energy.  Feeling nervous about going to sleep.  Not feeling rested in the morning.  Having trouble concentrating.  Feeling irritable, anxious, or depressed. How is this diagnosed? This condition may be diagnosed based on:  Your symptoms and medical history. Your health care provider may ask about: ? Your sleep habits. ? Any medical conditions you have. ? Your mental health.  A physical exam. How is this treated? Treatment for insomnia depends on the cause. Treatment may focus on treating an underlying condition that is causing insomnia. Treatment may also include:  Medicines to help you sleep.  Counseling or therapy.  Lifestyle adjustments to help you sleep better. Follow these instructions at home: Eating and drinking   Limit or avoid alcohol, caffeinated beverages, and cigarettes, especially close to bedtime. These can disrupt your sleep.    Do not eat a large meal or eat spicy foods right before bedtime. This can lead to digestive discomfort that can make it hard for you to sleep. Sleep habits   Keep a sleep diary to help you and your health care  provider figure out what could be causing your insomnia. Write down: ? When you sleep. ? When you wake up during the night. ? How well you sleep. ? How rested you feel the next day. ? Any side effects of medicines you are taking. ? What you eat and drink.  Make your bedroom a dark, comfortable place where it is easy to fall asleep. ? Put up shades or blackout curtains to block light from outside. ? Use a white noise machine to block noise. ? Keep the temperature cool.  Limit screen use before bedtime. This includes: ? Watching TV. ? Using your smartphone, tablet, or computer.  Stick to a routine that includes going to bed and waking up at the same times every day and night. This can help you fall asleep faster. Consider making a quiet activity, such as reading, part of your nighttime routine.  Try to avoid taking naps during the day so that you sleep better at night.  Get out of bed if you are still awake after 15 minutes of trying to sleep. Keep the lights down, but try reading or doing a quiet activity. When you feel sleepy, go back to bed. General instructions  Take over-the-counter and prescription medicines only as told by your health care provider.  Exercise regularly, as told by your health care provider. Avoid exercise starting several hours before bedtime.  Use relaxation techniques to manage stress. Ask your health care provider to suggest some techniques that may work well for you. These may include: ? Breathing exercises. ? Routines to release muscle tension. ? Visualizing peaceful scenes.  Make sure that you drive carefully. Avoid driving if you feel very sleepy.  Keep all follow-up visits as told by your health care provider. This is important. Contact a health care provider if:  You are tired throughout the day.  You have trouble in your daily routine due to sleepiness.  You continue to have sleep problems, or your sleep problems get worse. Get help right  away if:  You have serious thoughts about hurting yourself or someone else. If you ever feel like you may hurt yourself or others, or have thoughts about taking your own life, get help right away. You can go to your nearest emergency department or call:  Your local emergency services (911 in the [place]  A suicide crisis helpline, such as the National Suicide Prevention Lifeline at 1-800-273-8255. This is open 24 hours a day. Summary  Insomnia is a sleep disorder that makes it difficult to fall asleep or stay asleep.  Insomnia can be long-term (chronic) or short-term (acute).  Treatment for insomnia depends on the cause. Treatment may focus on treating an underlying condition that is causing insomnia.  Keep a sleep diary to help you and your health care provider figure out what could be causing your insomnia. This information is not intended to replace advice given to you by your health care provider. Make sure you discuss any questions you have with your health care provider. Document Revised: 02/05/2017 Document Reviewed: 12/03/2016 Elsevier Patient Education  2020 Elsevier Inc.  

## 2019-06-05 ENCOUNTER — Telehealth: Payer: Self-pay | Admitting: Emergency Medicine

## 2019-06-05 MED ORDER — ALPRAZOLAM 0.5 MG PO TABS: 0.5000 mg | ORAL_TABLET | Freq: Every day | ORAL | 1 refills | Status: DC | PRN

## 2019-06-05 NOTE — Telephone Encounter (Signed)
Patient is requesting a refill of the following medications: Requested Prescriptions   Pending Prescriptions Disp Refills  . ALPRAZolam (XANAX) 0.5 MG tablet 30 tablet 1    Sig: Take 1 tablet (0.5 mg total) by mouth daily as needed for anxiety.    Date of patient request: 06/05/19 Last office visit: 05/31/19 Date of last refill: 04/04/19 Last refill amount: 30 + 1 Follow up time period per chart:

## 2019-06-05 NOTE — Telephone Encounter (Signed)
Medication Refill - Medication:  ALPRAZolam (XANAX) 0.5 MG tablet    Has the patient contacted their pharmacy? Yes advised to call office.   Preferred Pharmacy (with phone number or street name):  [id number] [place], [place] - [address] AT [facility] & [address] Phone:  [phone]  Fax:  [phone]     Agent: Please be advised that RX refills may take up to 3 business days. We ask that you follow-up with your pharmacy.

## 2019-06-05 NOTE — Telephone Encounter (Signed)
Requested medication (s) are due for refill today: yes  Requested medication (s) are on the active medication list: yes  Last refill: 04/04/19  Future visit scheduled: yes  Notes to clinic:  Not delegated    Requested Prescriptions  Pending Prescriptions Disp Refills   ALPRAZolam (XANAX) 0.5 MG tablet 30 tablet 1    Sig: Take 1 tablet (0.5 mg total) by mouth daily as needed for anxiety.      Not Delegated - Psychiatry:  Anxiolytics/Hypnotics Failed - 06/05/2019  3:50 PM      Failed - This refill cannot be delegated      Failed - Urine Drug Screen completed in last 360 days.      Passed - Valid encounter within last 6 months    Recent Outpatient Visits           5 days ago Chronic insomnia   [facility] at [facility], Eilleen Kempf, MD   2 months ago Essential hypertension   Primary Care at [facility], Eilleen Kempf, MD   7 months ago Situational anxiety   Primary Care at [place], Eilleen Kempf, MD   8 months ago Allergic reaction, initial encounter   Primary Care at [facility], Eilleen Kempf, MD   8 months ago Essential hypertension   Primary Care at [facility], Eilleen Kempf, MD       Future Appointments             In 3 months Sagard[place], Eilleen Kempf, MD Primary Care at [place], [facility]

## 2019-06-06 ENCOUNTER — Telehealth: Payer: Self-pay

## 2019-06-06 NOTE — Telephone Encounter (Signed)
No action needed

## 2019-07-14 ENCOUNTER — Telehealth: Payer: Self-pay

## 2019-07-17 NOTE — Telephone Encounter (Signed)
No ation required at this time, closing encounter from inbox 

## 2019-07-18 NOTE — Telephone Encounter (Signed)
Spoke with pt and scheduled appt °

## 2019-07-19 ENCOUNTER — Other Ambulatory Visit: Payer: Self-pay

## 2019-07-19 ENCOUNTER — Ambulatory Visit (INDEPENDENT_AMBULATORY_CARE_PROVIDER_SITE_OTHER): Payer: 59 | Admitting: Emergency Medicine

## 2019-07-19 ENCOUNTER — Encounter: Payer: Self-pay | Admitting: Emergency Medicine

## 2019-07-19 VITALS — BP 145/73 | HR 65 | Temp 98.3°F | Ht 60.0 in | Wt 136.2 lb

## 2019-07-19 DIAGNOSIS — N941 Unspecified dyspareunia: Secondary | ICD-10-CM | POA: Diagnosis not present

## 2019-07-19 DIAGNOSIS — M25519 Pain in unspecified shoulder: Secondary | ICD-10-CM

## 2019-07-19 DIAGNOSIS — L989 Disorder of the skin and subcutaneous tissue, unspecified: Secondary | ICD-10-CM | POA: Diagnosis not present

## 2019-07-19 DIAGNOSIS — N952 Postmenopausal atrophic vaginitis: Secondary | ICD-10-CM | POA: Diagnosis not present

## 2019-07-19 MED ORDER — PREMARIN 0.625 MG/GM VA CREA: 1.0000 | TOPICAL_CREAM | Freq: Every day | VAGINAL | 12 refills | Status: DC

## 2019-07-19 MED ORDER — IBUPROFEN 600 MG PO TABS: 600.0000 mg | ORAL_TABLET | Freq: Every day | ORAL | 0 refills | Status: DC | PRN

## 2019-07-19 NOTE — Patient Instructions (Addendum)
If you have lab work done today you will be contacted with your lab results within the next 2 weeks.  If you have not heard from [place] then please contact us. The fastest way to get your results is to register for My Chart.   IF you received an x-ray today, you will receive an invoice from [facility]. Please contact [facility] at [phone] with questions or concerns regarding your invoice.   IF you received labwork today, you will receive an invoice from [place]. Please contact [facility] at [phone] with questions or concerns regarding your invoice.   Our billing staff will not be able to assist you with questions regarding bills from these companies.  You will be contacted with the lab results as soon as they are available. The fastest way to get your results is to activate your My Chart account. Instructions are located on the last page of this paperwork. If you have not heard from [place] regarding the results in 2 weeks, please contact this office.      Dyspareunia, Female Dyspareunia is pain that is associated with sexual activity. This can affect any part of the genitals or lower abdomen. There are many possible causes of this condition. In some cases, diagnosing the cause of dyspareunia can be difficult. This condition can be mild, moderate, or severe. Depending on the cause, dyspareunia may get better with treatment, but may return (recur) over time. What are the causes?  The cause of this condition is not always known. However, problems that affect the vulva, vagina, uterus, and other organs may cause dyspareunia. Common causes of this condition include:  Vaginal dryness.  Giving birth.  Infection.  Skin changes or conditions.  Side effects of medicines.  Endometriosis. This is when tissue that is like the lining of the uterus grows on the outside of the uterus.  Psychological conditions. These include depression, anxiety, or traumatic  experiences.  Allergic reaction. What increases the risk? The following factors may make you more likely to develop this condition:  History of physical or sexual trauma.  Some medicines.  No longer having a monthly period (menopause).  Having recently given birth.  Taking baths using soaps that have perfumes. These can cause irritation.  Douching. What are the signs or symptoms? The main symptom of this condition is pain in any part of your genitals or lower abdomen during or after sex. This may include:  Irritation, burning, or stinging sensations in your vulva.  Discomfort when your vulva or surrounding area is touched.  Aching and throbbing pain that may be constant.  Pain that gets worse when something is inserted into your vagina. How is this diagnosed? This condition may be diagnosed based on:  Your symptoms, including where and when your pain occurs.  Your medical history.  A physical exam. A pelvic exam will most likely be done.  Tests that include ultrasound, blood tests, and tests that check the body for infection.  Imaging tests, such as X-ray, MRI, and CT scan. You may be referred to a health care provider who specializes in women's health (gynecologist). How is this treated? Treatment depends on the cause of your condition and your symptoms. In most cases, you may need to stop sexual activity until your symptoms go away or get better. Treatment may include:  Lubricants, ointments, and creams.  Physical therapy.  Massage therapy.  Hormonal therapy.  Medicines to: ? Prevent or fight infection. ? Relieve pain. ? Help numb the  area. ? Treat depression (antidepressants).  Counseling, which may include sex therapy.  Surgery. Follow these instructions at home: Lifestyle  Wear cotton underwear.  Use water-based lubricants as needed during sex. Avoid oil-based lubricants.  Do not use any products that can cause irritation. This may include  certain condoms, spermicides, lubricants, soaps, tampons, vaginal sprays, or douches.  Always practice safe sex. Use a condom to prevent sexually transmitted infections (STIs).  Talk freely with your partner about your condition. General instructions  Take or apply over-the-counter and prescription medicines only as told by your health care provider.  Urinate before you have sex.  Consider joining a support group.  Get the results of any tests you have done. Ask your health care provider, or the department that is doing the procedure, when your results will be ready.  Keep all follow-up visits as told by your health care provider. This is important. Contact a health care provider if:  You have vaginal bleeding after having sex.  You develop a lump at the opening of your vagina even if the lump is painless.  You have: ? Abnormal discharge from your vagina. ? Vaginal dryness. ? Itchiness or irritation of your vulva or vagina. ? A new rash. ? Symptoms that get worse or do not improve with treatment. ? A fever. ? Pain when you urinate. ? Blood in your urine. Get help right away if:  You have severe pain in your abdomen during or shortly after sex.  You pass out after sex. Summary  Dyspareunia is pain that is associated with sexual activity. This can affect any part of the genitals or lower abdomen.  There are many causes of this condition. Treatment depends on the cause and your symptoms. In most cases, you may need to stop sexual activity until your symptoms improve.  Take or apply over-the-counter and prescription medicines only as told by your health care provider.  Contact a health care provider if your symptoms get worse or do not improve with treatment.  Keep all follow-up visits as told by your health care provider. This is important. This information is not intended to replace advice given to you by your health care provider. Make sure you discuss any questions you  have with your health care provider. Document Revised: 05/02/2018 Document Reviewed: 05/02/2018 Elsevier Patient Education  [place].

## 2019-07-19 NOTE — Addendum Note (Signed)
Addended by: Evie Lacks on: 07/19/2019 12:15 PM   Modules accepted: Orders

## 2019-07-19 NOTE — Progress Notes (Addendum)
Katie Gardner 64 y.o.   Chief Complaint  Patient presents with  . Vaginal Discharge    light and dry going on a couple of day     HISTORY OF PRESENT ILLNESS: This is a 64 y.o. female complaining of painful intercourse and vaginal dryness. No other complaints or medical concerns today.  HPI   [name]   Medication Sig Start Date End Date Taking? Authorizing Provider  ALPRAZolam Prudy Feeler) 0.5 MG tablet Take 1 tablet (0.5 mg total) by mouth daily as needed for anxiety. 06/05/19  Yes Kayce Chismar, Eilleen Kempf, MD  cyclobenzaprine (FLEXERIL) 5 MG tablet Take 1 tablet (5 mg total) by mouth 3 (three) times daily as needed for muscle spasms. 09/12/18  Yes Mikita Lesmeister, Eilleen Kempf, MD  FLUoxetine (PROZAC) 20 MG capsule Take 1 capsule (20 mg total) by mouth daily. 05/23/19 08/21/19 Yes Katisha Shimizu, Eilleen Kempf, MD  ibuprofen (ADVIL) 600 MG tablet Take 1 tablet (600 mg total) by mouth daily as needed for moderate pain. 04/25/19  Yes Karyss Frese, Eilleen Kempf, MD  lisinopril (PRINIVIL,ZESTRIL) 10 MG tablet Take 1 tablet (10 mg total) by mouth daily. 04/22/18  Yes Katheryne Gorr, Eilleen Kempf, MD  loratadine (CLARITIN) 10 MG tablet Take 1 tablet (10 mg total) by mouth daily. 09/12/18  Yes Dacari Beckstrand, Eilleen Kempf, MD  rosuvastatin (CRESTOR) 10 MG tablet Take 1 tablet (10 mg total) by mouth daily. 09/13/18  Yes Adriena Manfre, Eilleen Kempf, MD  traZODone (DESYREL) 50 MG tablet Take 1 tablet (50 mg total) by mouth at bedtime as needed for sleep. 05/31/19  Yes Georgina Quint, MD    Allergies  Allergen Reactions  . Hydrocodone Nausea And Vomiting    dizzy  . Mucinex Dm [Dm-Guaifenesin Er] Swelling    Sob, throat closing    Patient Active Problem List   Diagnosis Date Noted  . Chronic insomnia 05/31/2019  . Uncontrolled hypertension 12/17/2017  . Seasonal allergies 12/17/2017  . Depression 11/13/2016  . Chronic anxiety 11/13/2016  . Chronic sore throat 11/13/2016  . Hot flashes   . Situational anxiety     . Hyperlipidemia     Past Medical History:  Diagnosis Date  . Allergy    seasonal  . Anxiety   . Depression   . Hematuria   . Hot flashes   . Hx of cardiovascular stress test    Lex MV 12/13:  EF 68%, mild apical thinning, no ischemia  . Hyperlipidemia     Past Surgical History:  Procedure Laterality Date  . BREAST LUMPECTOMY     right breast, not cancer  . Carpel tunnel    . ECTOPIC PREGNANCY SURGERY      Social History   Socioeconomic History  . Marital status: Legally Separated    Spouse name: Not on file  . Number of children: 1  . Years of education: 8th grade  . Highest education level: Not on file  Occupational History    Employer: UNEMPLOYED  Tobacco Use  . Smoking status: Former Games developer  . Smokeless tobacco: Never Used  Substance and Sexual Activity  . Alcohol use: No  . Drug use: No  . Sexual activity: Yes  Other Topics Concern  . Not on file  Social History Narrative  . Not on file   Social Determinants of Health   Financial Resource Strain:   . Difficulty of Paying Living Expenses:   Food Insecurity:   . Worried About Programme researcher, broadcasting/film/video in the Last Year:   . [employer]  of Food in the Last Year:   Transportation Needs:   . Film/video editor (Medical):   Marland Kitchen Lack of Transportation (Non-Medical):   Physical Activity:   . Days of Exercise per Week:   . Minutes of Exercise per Session:   Stress:   . Feeling of Stress :   Social Connections:   . Frequency of Communication with Friends and Family:   . Frequency of Social Gatherings with Friends and Family:   . Attends Religious Services:   . Active Member of Clubs or Organizations:   . Attends Archivist Meetings:   Marland Kitchen Marital Status:   Intimate Partner Violence:   . Fear of Current or Ex-Partner:   . Emotionally Abused:   Marland Kitchen Physically Abused:   . Sexually Abused:     Family History  Problem Relation Age of Onset  . Heart disease Mother   . Diabetes Mother   . Esophageal  cancer Father        43's  . Colon cancer Neg Hx      Review of Systems  Constitutional: Negative.  Negative for chills and fever.  HENT: Negative.  Negative for congestion and sore throat.   Respiratory: Negative.  Negative for cough and shortness of breath.   Cardiovascular: Negative.  Negative for chest pain and palpitations.  Gastrointestinal: Negative for abdominal pain, blood in stool, diarrhea, melena, nausea and vomiting.  Genitourinary: Negative.  Negative for dysuria and hematuria.  Musculoskeletal: Negative.  Negative for joint pain and myalgias.  Skin: Negative.  Negative for rash.       Multiple skin lesions, requesting dermatology referral  Neurological: Negative.  Negative for dizziness and headaches.  All other systems reviewed and are negative.  Today's Vitals   07/19/19 1123  BP: (!) 145/73  Pulse: 65  Temp: 98.3 F (36.8 C)  TempSrc: Temporal  SpO2: 99%  Weight: 136 lb 3.2 oz (61.8 kg)  Height: 5' (1.524 m)   Body mass index is 26.6 kg/m.   Physical Exam Vitals reviewed.  Constitutional:      Appearance: Normal appearance.  HENT:     Head: Normocephalic.  Eyes:     Extraocular Movements: Extraocular movements intact.     Pupils: Pupils are equal, round, and reactive to light.  Cardiovascular:     Rate and Rhythm: Normal rate.  Pulmonary:     Effort: Pulmonary effort is normal.  Musculoskeletal:        General: Normal range of motion.     Cervical back: Normal range of motion.  Skin:    Capillary Refill: Capillary refill takes less than 2 seconds.     Comments: Several skin lesions mostly on her neck  Neurological:     General: No focal deficit present.     Mental Status: She is alert and oriented to person, place, and time.  Psychiatric:        Mood and Affect: Mood normal.        Behavior: Behavior normal.      ASSESSMENT & PLAN: [name] was seen today for vaginal discharge.  Diagnoses and all orders for this visit:  Dyspareunia  in female -     Ambulatory referral to Gynecology -     conjugated estrogens (PREMARIN) vaginal cream; Place 1 Applicatorful vaginally daily.  Post-menopausal atrophic vaginitis -     Ambulatory referral to Gynecology  Arthralgia of shoulder, unspecified laterality -     ibuprofen (ADVIL) 600 MG tablet; Take 1 tablet (600  mg total) by mouth daily as needed for moderate pain.  Skin lesions, generalized -     Ambulatory referral to Dermatology     Patient Instructions       If you have lab work done today you will be contacted with your lab results within the next 2 weeks.  If you have not heard from [place] then please contact us. The fastest way to get your results is to register for My Chart.   IF you received an x-ray today, you will receive an invoice from [facility]. Please contact [facility] at [phone] with questions or concerns regarding your invoice.   IF you received labwork today, you will receive an invoice from [place]. Please contact [facility] at [phone] with questions or concerns regarding your invoice.   Our billing staff will not be able to assist you with questions regarding bills from these companies.  You will be contacted with the lab results as soon as they are available. The fastest way to get your results is to activate your My Chart account. Instructions are located on the last page of this paperwork. If you have not heard from [place] regarding the results in 2 weeks, please contact this office.      Dyspareunia, Female Dyspareunia is pain that is associated with sexual activity. This can affect any part of the genitals or lower abdomen. There are many possible causes of this condition. In some cases, diagnosing the cause of dyspareunia can be difficult. This condition can be mild, moderate, or severe. Depending on the cause, dyspareunia may get better with treatment, but may return (recur) over time. What are the causes?  The cause  of this condition is not always known. However, problems that affect the vulva, vagina, uterus, and other organs may cause dyspareunia. Common causes of this condition include:  Vaginal dryness.  Giving birth.  Infection.  Skin changes or conditions.  Side effects of medicines.  Endometriosis. This is when tissue that is like the lining of the uterus grows on the outside of the uterus.  Psychological conditions. These include depression, anxiety, or traumatic experiences.  Allergic reaction. What increases the risk? The following factors may make you more likely to develop this condition:  History of physical or sexual trauma.  Some medicines.  No longer having a monthly period (menopause).  Having recently given birth.  Taking baths using soaps that have perfumes. These can cause irritation.  Douching. What are the signs or symptoms? The main symptom of this condition is pain in any part of your genitals or lower abdomen during or after sex. This may include:  Irritation, burning, or stinging sensations in your vulva.  Discomfort when your vulva or surrounding area is touched.  Aching and throbbing pain that may be constant.  Pain that gets worse when something is inserted into your vagina. How is this diagnosed? This condition may be diagnosed based on:  Your symptoms, including where and when your pain occurs.  Your medical history.  A physical exam. A pelvic exam will most likely be done.  Tests that include ultrasound, blood tests, and tests that check the body for infection.  Imaging tests, such as X-ray, MRI, and CT scan. You may be referred to a health care provider who specializes in women's health (gynecologist). How is this treated? Treatment depends on the cause of your condition and your symptoms. In most cases, you may need to stop sexual activity until your symptoms go away or get better.  Treatment may include:  Lubricants, ointments, and  creams.  Physical therapy.  Massage therapy.  Hormonal therapy.  Medicines to: ? Prevent or fight infection. ? Relieve pain. ? Help numb the area. ? Treat depression (antidepressants).  Counseling, which may include sex therapy.  Surgery. Follow these instructions at home: Lifestyle  Wear cotton underwear.  Use water-based lubricants as needed during sex. Avoid oil-based lubricants.  Do not use any products that can cause irritation. This may include certain condoms, spermicides, lubricants, soaps, tampons, vaginal sprays, or douches.  Always practice safe sex. Use a condom to prevent sexually transmitted infections (STIs).  Talk freely with your partner about your condition. General instructions  Take or apply over-the-counter and prescription medicines only as told by your health care provider.  Urinate before you have sex.  Consider joining a support group.  Get the results of any tests you have done. Ask your health care provider, or the department that is doing the procedure, when your results will be ready.  Keep all follow-up visits as told by your health care provider. This is important. Contact a health care provider if:  You have vaginal bleeding after having sex.  You develop a lump at the opening of your vagina even if the lump is painless.  You have: ? Abnormal discharge from your vagina. ? Vaginal dryness. ? Itchiness or irritation of your vulva or vagina. ? A new rash. ? Symptoms that get worse or do not improve with treatment. ? A fever. ? Pain when you urinate. ? Blood in your urine. Get help right away if:  You have severe pain in your abdomen during or shortly after sex.  You pass out after sex. Summary  Dyspareunia is pain that is associated with sexual activity. This can affect any part of the genitals or lower abdomen.  There are many causes of this condition. Treatment depends on the cause and your symptoms. In most cases, you may  need to stop sexual activity until your symptoms improve.  Take or apply over-the-counter and prescription medicines only as told by your health care provider.  Contact a health care provider if your symptoms get worse or do not improve with treatment.  Keep all follow-up visits as told by your health care provider. This is important. This information is not intended to replace advice given to you by your health care provider. Make sure you discuss any questions you have with your health care provider. Document Revised: 05/02/2018 Document Reviewed: 05/02/2018 Elsevier Patient Education  2020 Elsevier Inc.      Edwina Barth, MD [facility]

## 2019-07-24 ENCOUNTER — Telehealth: Payer: Self-pay | Admitting: Dermatology

## 2019-07-24 NOTE — Telephone Encounter (Signed)
Patient left message & when I returned her call she told me that she was being referred by another practice.

## 2019-07-27 ENCOUNTER — Other Ambulatory Visit: Payer: Self-pay

## 2019-07-27 ENCOUNTER — Ambulatory Visit (INDEPENDENT_AMBULATORY_CARE_PROVIDER_SITE_OTHER): Payer: Commercial Managed Care - HMO | Admitting: Family Medicine

## 2019-07-27 VITALS — BP 126/80 | HR 80 | Ht 60.0 in | Wt 134.4 lb

## 2019-07-27 DIAGNOSIS — L918 Other hypertrophic disorders of the skin: Secondary | ICD-10-CM | POA: Insufficient documentation

## 2019-07-27 NOTE — Assessment & Plan Note (Signed)
Option of snipping pedunculated lesion and shaving sessile lesions were declined by patient when procedure described. She would like to continue monitor the areas for further skin changes. These lesion are benign.

## 2019-07-27 NOTE — Progress Notes (Signed)
    SUBJECTIVE:   CHIEF COMPLAINT / HPI:   Ms. Ruggirello presents to dermatology clinic today for the below concerns.   Multiple Neck moles These lesions do not bother her but she would like them removed for cosmetic purposes. They do not have pruritis but has started to spread over the years.     PERTINENT  PMH / PSH: seasonal allergies  OBJECTIVE:   BP 126/80   Pulse 80   Ht 5' (1.524 m)   Wt 134 lb 6.4 oz (61 kg)   SpO2 98%   BMI 26.25 kg/m         ASSESSMENT/PLAN:   Skin tags, multiple acquired Option of snipping pedunculated lesion and shaving sessile lesions were declined by patient when procedure described. She would like to continue monitor the areas for further skin changes. These lesion are benign.    Lavonda Jumbo, DO [facility]

## 2019-07-27 NOTE — Patient Instructions (Signed)
It was very nice to meet you today.   Please enjoy the rest of your week.   Today you were seen for multiple neck moles.   You opted to not have them removed.   Please continue to monitor for skin changes.   Please return if you change your mind or they have rapid growth or discoloration.    It was our pleasure to serve you.  Dr. Salvadore Dom, DO

## 2019-08-16 ENCOUNTER — Other Ambulatory Visit: Payer: Self-pay | Admitting: Emergency Medicine

## 2019-08-16 DIAGNOSIS — M25519 Pain in unspecified shoulder: Secondary | ICD-10-CM

## 2019-08-16 MED ORDER — ALPRAZOLAM 0.5 MG PO TABS: 0.5000 mg | ORAL_TABLET | Freq: Every day | ORAL | 1 refills | Status: DC | PRN

## 2019-08-16 NOTE — Telephone Encounter (Signed)
Requested Prescriptions  Pending Prescriptions Disp Refills  . ibuprofen (ADVIL) 600 MG tablet [Pharmacy Med Name: IBUPROFEN 600MG  TABLETS] 30 tablet 0    Sig: TAKE 1 TABLET(600 MG) BY MOUTH DAILY AS NEEDED FOR MODERATE PAIN     Analgesics:  NSAIDS Passed - 08/16/2019  3:53 AM      Passed - Cr in normal range and within 360 days    Creatinine, Ser  Date Value Ref Range Status  03/15/2019 0.73 0.57 - 1.00 mg/dL Final   Creatinine,U  Date Value Ref Range Status  02/08/2010  mg/dL Final   14/05/2009 (NOTE)  Cutoff Values for Urine Drug Screen:        Drug Class           Cutoff (ng/mL)        Amphetamines            1000        Barbiturates             200        Cocaine Metabolites      300        Benzodiazepines          200        Methadone                 300        Opiates                 2000        Phencyclidine             25        Propoxyphene             300        Marijuana Metabolites     50  For medical purposes only.         Passed - HGB in normal range and within 360 days    Hemoglobin  Date Value Ref Range Status  09/12/2018 13.0 11.1 - 15.9 g/dL Final         Passed - Patient is not pregnant      Passed - Valid encounter within last 12 months    Recent Outpatient Visits          4 weeks ago Dyspareunia in female   [facility] at [place], [place], MD   2 months ago Chronic insomnia   [facility] at [facility], [facility], MD   5 months ago Essential hypertension   [facility] at [place], [place], MD   9 months ago Situational anxiety   [facility] at [place], [place], MD   11 months ago Allergic reaction, initial encounter   [facility] at [facility], [facility], MD      Future Appointments            In 4 weeks Sagardia, Eilleen Kempf, MD [facility] at [place], [facility]

## 2019-08-17 ENCOUNTER — Telehealth: Payer: Self-pay | Admitting: *Deleted

## 2019-08-17 NOTE — Telephone Encounter (Signed)
Faxed Rx request for Cetirizine 10 mg to [facility]. Confirmation page 8:14 am.

## 2019-09-01 ENCOUNTER — Other Ambulatory Visit: Payer: Self-pay | Admitting: Emergency Medicine

## 2019-09-01 DIAGNOSIS — E785 Hyperlipidemia, unspecified: Secondary | ICD-10-CM

## 2019-09-12 ENCOUNTER — Other Ambulatory Visit: Payer: Self-pay | Admitting: Emergency Medicine

## 2019-09-12 DIAGNOSIS — M25519 Pain in unspecified shoulder: Secondary | ICD-10-CM

## 2019-09-13 ENCOUNTER — Ambulatory Visit (INDEPENDENT_AMBULATORY_CARE_PROVIDER_SITE_OTHER): Payer: 59 | Admitting: Emergency Medicine

## 2019-09-13 ENCOUNTER — Encounter: Payer: Self-pay | Admitting: Emergency Medicine

## 2019-09-13 ENCOUNTER — Other Ambulatory Visit: Payer: Self-pay

## 2019-09-13 VITALS — BP 106/63 | HR 75 | Temp 98.0°F | Ht 60.0 in | Wt 129.0 lb

## 2019-09-13 DIAGNOSIS — F5104 Psychophysiologic insomnia: Secondary | ICD-10-CM

## 2019-09-13 DIAGNOSIS — F329 Major depressive disorder, single episode, unspecified: Secondary | ICD-10-CM

## 2019-09-13 DIAGNOSIS — N952 Postmenopausal atrophic vaginitis: Secondary | ICD-10-CM | POA: Diagnosis not present

## 2019-09-13 DIAGNOSIS — F418 Other specified anxiety disorders: Secondary | ICD-10-CM | POA: Diagnosis not present

## 2019-09-13 DIAGNOSIS — I10 Essential (primary) hypertension: Secondary | ICD-10-CM

## 2019-09-13 DIAGNOSIS — F419 Anxiety disorder, unspecified: Secondary | ICD-10-CM

## 2019-09-13 DIAGNOSIS — E785 Hyperlipidemia, unspecified: Secondary | ICD-10-CM

## 2019-09-13 DIAGNOSIS — G8929 Other chronic pain: Secondary | ICD-10-CM | POA: Insufficient documentation

## 2019-09-13 DIAGNOSIS — N941 Unspecified dyspareunia: Secondary | ICD-10-CM | POA: Insufficient documentation

## 2019-09-13 DIAGNOSIS — M7918 Myalgia, other site: Secondary | ICD-10-CM | POA: Insufficient documentation

## 2019-09-13 MED ORDER — ALPRAZOLAM 0.5 MG PO TABS: 0.5000 mg | ORAL_TABLET | Freq: Every day | ORAL | 1 refills | Status: DC | PRN

## 2019-09-13 NOTE — Progress Notes (Signed)
Katie Gardner 64 y.o.   Chief Complaint  Patient presents with   Follow-up    on Chronic musculoskeletal pain. Pt stats she isn't in any pain today. pt stats 2 days ago the pain was intence in her R shin. Pt reports a pulling sinsation.   Medication Refill    pt would like a refill on her allergy medication and xanax. pt reports medications work well with no side effects.   Referral    pt would like a referral to OBGYN for a private issue and a PAP.    HISTORY OF PRESENT ILLNESS: This is a 64 y.o. female with several chronic medical problems here for follow-up and medication refill. #1 has history of chronic depression anxiety as well as situational anxiety.  Takes Xanax daily as needed.  Also on Prozac and trazodone at bedtime.  Doing well.  No complaints. #2 chronic musculoskeletal pain.  Takes Flexeril as needed. #3 dyslipidemia: Takes rosuvastatin 10 mg daily. #4 chronic vaginal dryness: Referred to GYN earlier this year but unable to schedule #5 essential hypertension: On lisinopril 10 mg daily.  HPI   [name]   Medication Sig Start Date End Date Taking? Authorizing Provider  ALPRAZolam Prudy Feeler) 0.5 MG tablet Take 1 tablet (0.5 mg total) by mouth daily as needed for anxiety. 09/13/19  Yes Jacque Byron, Eilleen Kempf, MD  conjugated estrogens (PREMARIN) vaginal cream Place 1 Applicatorful vaginally daily. 07/19/19  Yes Anahi Belmar, Eilleen Kempf, MD  cyclobenzaprine (FLEXERIL) 5 MG tablet Take 1 tablet (5 mg total) by mouth 3 (three) times daily as needed for muscle spasms. 09/12/18  Yes Ameir Faria, Eilleen Kempf, MD  ibuprofen (ADVIL) 600 MG tablet TAKE 1 TABLET(600 MG) BY MOUTH DAILY AS NEEDED FOR MODERATE PAIN 09/12/19  Yes Stiles Maxcy, Eilleen Kempf, MD  lisinopril (PRINIVIL,ZESTRIL) 10 MG tablet Take 1 tablet (10 mg total) by mouth daily. 04/22/18  Yes Shamone Winzer, Eilleen Kempf, MD  loratadine (CLARITIN) 10 MG tablet Take 1 tablet (10 mg total) by mouth daily. 09/12/18  Yes Jhase Creppel,  Eilleen Kempf, MD  rosuvastatin (CRESTOR) 10 MG tablet TAKE 1 TABLET(10 MG) BY MOUTH DAILY 09/01/19  Yes Ruhan Borak, Eilleen Kempf, MD  traZODone (DESYREL) 50 MG tablet Take 1 tablet (50 mg total) by mouth at bedtime as needed for sleep. 05/31/19  Yes Levii Hairfield, Eilleen Kempf, MD  FLUoxetine (PROZAC) 20 MG capsule Take 1 capsule (20 mg total) by mouth daily. 05/23/19 08/21/19  Georgina Quint, MD    Allergies  Allergen Reactions   Hydrocodone Nausea And Vomiting    dizzy   Mucinex Dm [Dm-Guaifenesin Er] Swelling    Sob, throat closing    Patient Active Problem List   Diagnosis Date Noted   Skin tags, multiple acquired 07/27/2019   Chronic insomnia 05/31/2019   Uncontrolled hypertension 12/17/2017   Seasonal allergies 12/17/2017   Depression 11/13/2016   Chronic anxiety 11/13/2016   Chronic sore throat 11/13/2016   Hot flashes    Situational anxiety    Hyperlipidemia     Past Medical History:  Diagnosis Date   Allergy    seasonal   Anxiety    Depression    Hematuria    Hot flashes    Hx of cardiovascular stress test    Lex MV 12/13:  EF 68%, mild apical thinning, no ischemia   Hyperlipidemia     Past Surgical History:  Procedure Laterality Date   BREAST LUMPECTOMY     right breast, not cancer   Carpel tunnel  ECTOPIC PREGNANCY SURGERY      Social History   Socioeconomic History   Marital status: Legally Separated    Spouse name: Not on file   Number of children: 1   Years of education: 8th grade   Highest education level: Not on file  Occupational History    Employer: UNEMPLOYED  Tobacco Use   Smoking status: Former Smoker   Smokeless tobacco: Never Used  Building services engineer Use: Never used  Substance and Sexual Activity   Alcohol use: No   Drug use: No   Sexual activity: Yes  Other Topics Concern   Not on file  Social History Narrative   Not on file   Social Determinants of Health   Financial Resource Strain:      Difficulty of Paying Living Expenses:   Food Insecurity:    Worried About Programme researcher, broadcasting/film/video in the Last Year:    Barista in the Last Year:   Transportation Needs:    Freight forwarder (Medical):    Lack of Transportation (Non-Medical):   Physical Activity:    Days of Exercise per Week:    Minutes of Exercise per Session:   Stress:    Feeling of Stress :   Social Connections:    Frequency of Communication with Friends and Family:    Frequency of Social Gatherings with Friends and Family:    Attends Religious Services:    Active Member of Clubs or Organizations:    Attends Engineer, structural:    Marital Status:   Intimate Partner Violence:    Fear of Current or Ex-Partner:    Emotionally Abused:    Physically Abused:    Sexually Abused:     Family History  Problem Relation Age of Onset   Heart disease Mother    Diabetes Mother    Esophageal cancer Father        52's   Colon cancer Neg Hx      Review of Systems  Constitutional: Negative.  Negative for chills and fever.  HENT: Negative.  Negative for congestion and sore throat.   Respiratory: Negative.  Negative for cough and shortness of breath.   Cardiovascular: Negative.  Negative for chest pain and palpitations.  Gastrointestinal: Negative.  Negative for abdominal pain, blood in stool, diarrhea, melena, nausea and vomiting.  Genitourinary: Negative.  Negative for dysuria and hematuria.  Musculoskeletal: Negative.  Negative for back pain, myalgias and neck pain.  Skin: Negative.  Negative for rash.  Neurological: Negative.  Negative for dizziness and headaches.  All other systems reviewed and are negative.  Today's Vitals   09/13/19 1031  BP: 106/63  Pulse: 75  Temp: 98 F (36.7 C)  TempSrc: Temporal  SpO2: 98%  Weight: 129 lb (58.5 kg)  Height: 5' (1.524 m)   Body mass index is 25.19 kg/m.   Physical Exam Vitals reviewed.  Constitutional:       Appearance: Normal appearance.  HENT:     Head: Normocephalic.  Eyes:     Extraocular Movements: Extraocular movements intact.     Pupils: Pupils are equal, round, and reactive to light.  Cardiovascular:     Rate and Rhythm: Normal rate and regular rhythm.     Pulses: Normal pulses.     Heart sounds: Normal heart sounds.  Pulmonary:     Effort: Pulmonary effort is normal.     Breath sounds: Normal breath sounds.  Musculoskeletal:  General: Normal range of motion.     Cervical back: Normal range of motion.  Skin:    General: Skin is warm and dry.     Capillary Refill: Capillary refill takes less than 2 seconds.  Neurological:     General: No focal deficit present.     Mental Status: She is alert and oriented to person, place, and time.  Psychiatric:        Mood and Affect: Mood normal.        Behavior: Behavior normal.    A total of 30 minutes was spent with the patient, greater than 50% of which was in counseling/coordination of care regarding multiple chronic medical problems, review of all medications, review of most recent office visit notes, review of most recent blood work results, need for gynecological evaluation and possible hormone replacement therapy, diet and nutrition and treatment of dyslipidemia, prognosis and need for follow-up.   ASSESSMENT & PLAN: Clinically stable.  No medical concerns identified during this visit.  Continue present medications.  No changes.  Follow-up in 6 months. [name] was seen today for follow-up, medication refill and referral.  Diagnoses and all orders for this visit:  Dyspareunia in female -     Ambulatory referral to Gynecology  Post-menopausal atrophic vaginitis -     Ambulatory referral to Gynecology  Chronic insomnia  Situational anxiety -     ALPRAZolam (XANAX) 0.5 MG tablet; Take 1 tablet (0.5 mg total) by mouth daily as needed for anxiety.  Anxiety and depression -     ALPRAZolam (XANAX) 0.5 MG tablet; Take 1  tablet (0.5 mg total) by mouth daily as needed for anxiety.  Dyslipidemia -     Lipid panel -     Hemoglobin A1c  Essential hypertension -     Comprehensive metabolic panel -     Hemoglobin A1c  Chronic musculoskeletal pain    Patient Instructions       If you have lab work done today you will be contacted with your lab results within the next 2 weeks.  If you have not heard from us then please contact us. The fastest way to get your results is to register for My Chart.   IF you received an x-ray today, you will receive an invoice from [facility]. Please contact [facility] at [phone] with questions or concerns regarding your invoice.   IF you received labwork today, you will receive an invoice from [facility]. Please contact [facility] at [phone] with questions or concerns regarding your invoice.   Our billing staff will not be able to assist you with questions regarding bills from these companies.  You will be contacted with the lab results as soon as they are available. The fastest way to get your results is to activate your My Chart account. Instructions are located on the last page of this paperwork. If you have not heard from us regarding the results in 2 weeks, please contact this office.     Atrophic Vaginitis Atrophic vaginitis is a condition in which the tissues that line the vagina become dry and thin. This condition occurs in women who have stopped having their period. It is caused by a drop in a female hormone (estrogen). This hormone helps:  To keep the vagina moist.  To make a clear fluid. This clear fluid helps: ? To make the vagina ready for sex. ? To protect the vagina from infection. If the lining of the vagina is dry and thin, it may  cause irritation, burning, or itchiness. It may also:  Make sex painful.  Make an exam of your vagina painful.  Cause bleeding.  Make you lose interest in sex.  Cause a burning feeling when  you pee (urinate).  Cause a brown or yellow fluid to come from your vagina. Some women do not have symptoms. Follow these instructions at home: Medicines  Take over-the-counter and prescription medicines only as told by your doctor.  Do not use herbs or other medicines unless your doctor says it is okay.  Use medicines for for dryness. These include: ? Oils to make the vagina soft. ? Creams. ? Moisturizers. General instructions  Do not douche.  Do not use products that can make your vagina dry. These include: ? Scented sprays. ? Scented tampons. ? Scented soaps.  Sex can help increase blood flow and soften the tissue in the vagina. If it hurts to have sex: ? Tell your partner. ? Use products to make sex more comfortable. Use these only as told by your doctor. Contact a doctor if you:  Have discharge from the vagina that is different than usual.  Have a bad smell coming from your vagina.  Have new symptoms.  Do not get better.  Get worse. Summary  Atrophic vaginitis is a condition in which the lining of the vagina becomes dry and thin.  This condition affects women who have stopped having their periods.  Treatment may include using products that help make the vagina soft.  Call a doctor if do not get better with treatment. This information is not intended to replace advice given to you by your health care provider. Make sure you discuss any questions you have with your health care provider. Document Revised: 03/08/2017 Document Reviewed: 03/08/2017 Elsevier Patient Education  2020 Elsevier Inc.      Edwina Barth, MD [facility]

## 2019-09-13 NOTE — Patient Instructions (Addendum)
     If you have lab work done today you will be contacted with your lab results within the next 2 weeks.  If you have not heard from us then please contact us. The fastest way to get your results is to register for My Chart.   IF you received an x-ray today, you will receive an invoice from [facility]. Please contact [facility] at [phone] with questions or concerns regarding your invoice.   IF you received labwork today, you will receive an invoice from [facility]. Please contact [facility] at [phone] with questions or concerns regarding your invoice.   Our billing staff will not be able to assist you with questions regarding bills from these companies.  You will be contacted with the lab results as soon as they are available. The fastest way to get your results is to activate your My Chart account. Instructions are located on the last page of this paperwork. If you have not heard from us regarding the results in 2 weeks, please contact this office.     Atrophic Vaginitis Atrophic vaginitis is a condition in which the tissues that line the vagina become dry and thin. This condition occurs in women who have stopped having their period. It is caused by a drop in a female hormone (estrogen). This hormone helps:  To keep the vagina moist.  To make a clear fluid. This clear fluid helps: ? To make the vagina ready for sex. ? To protect the vagina from infection. If the lining of the vagina is dry and thin, it may cause irritation, burning, or itchiness. It may also:  Make sex painful.  Make an exam of your vagina painful.  Cause bleeding.  Make you lose interest in sex.  Cause a burning feeling when you pee (urinate).  Cause a brown or yellow fluid to come from your vagina. Some women do not have symptoms. Follow these instructions at home: Medicines  Take over-the-counter and prescription medicines only as told by your doctor.  Do not use herbs or  other medicines unless your doctor says it is okay.  Use medicines for for dryness. These include: ? Oils to make the vagina soft. ? Creams. ? Moisturizers. General instructions  Do not douche.  Do not use products that can make your vagina dry. These include: ? Scented sprays. ? Scented tampons. ? Scented soaps.  Sex can help increase blood flow and soften the tissue in the vagina. If it hurts to have sex: ? Tell your partner. ? Use products to make sex more comfortable. Use these only as told by your doctor. Contact a doctor if you:  Have discharge from the vagina that is different than usual.  Have a bad smell coming from your vagina.  Have new symptoms.  Do not get better.  Get worse. Summary  Atrophic vaginitis is a condition in which the lining of the vagina becomes dry and thin.  This condition affects women who have stopped having their periods.  Treatment may include using products that help make the vagina soft.  Call a doctor if do not get better with treatment. This information is not intended to replace advice given to you by your health care provider. Make sure you discuss any questions you have with your health care provider. Document Revised: 03/08/2017 Document Reviewed: 03/08/2017 Elsevier Patient Education  2020 Elsevier Inc.  

## 2019-09-14 LAB — COMPREHENSIVE METABOLIC PANEL
ALT: 14 IU/L (ref 0–32)
AST: 19 IU/L (ref 0–40)
Albumin/Globulin Ratio: 1.8 (ref 1.2–2.2)
Albumin: 4.2 g/dL (ref 3.8–4.8)
Alkaline Phosphatase: 106 IU/L (ref 48–121)
BUN/Creatinine Ratio: 22 (ref 12–28)
BUN: 17 mg/dL (ref 8–27)
Bilirubin Total: 0.3 mg/dL (ref 0.0–1.2)
CO2: 27 mmol/L (ref 20–29)
Calcium: 9.4 mg/dL (ref 8.7–10.3)
Chloride: 105 mmol/L (ref 96–106)
Creatinine, Ser: 0.78 mg/dL (ref 0.57–1.00)
GFR calc Af Amer: 94 mL/min/{1.73_m2} (ref >59)
GFR calc non Af Amer: 81 mL/min/{1.73_m2} (ref >59)
Globulin, Total: 2.3 g/dL (ref 1.5–4.5)
Glucose: 93 mg/dL (ref 65–99)
Potassium: 4.7 mmol/L (ref 3.5–5.2)
Sodium: 143 mmol/L (ref 134–144)
Total Protein: 6.5 g/dL (ref 6.0–8.5)

## 2019-09-14 LAB — LIPID PANEL
Chol/HDL Ratio: 2.8 ratio (ref 0.0–4.4)
Cholesterol, Total: 196 mg/dL (ref 100–199)
HDL: 69 mg/dL (ref >39)
LDL Chol Calc (NIH): 111 mg/dL — ABNORMAL HIGH (ref 0–99)
Triglycerides: 89 mg/dL (ref 0–149)
VLDL Cholesterol Cal: 16 mg/dL (ref 5–40)

## 2019-09-14 LAB — HEMOGLOBIN A1C
Est. average glucose Bld gHb Est-mCnc: 120 mg/dL
Hgb A1c MFr Bld: 5.8 % — ABNORMAL HIGH (ref 4.8–5.6)

## 2019-09-17 ENCOUNTER — Other Ambulatory Visit: Payer: Self-pay | Admitting: Emergency Medicine

## 2019-09-17 DIAGNOSIS — F5104 Psychophysiologic insomnia: Secondary | ICD-10-CM

## 2019-09-17 NOTE — Telephone Encounter (Signed)
Requested Prescriptions  Pending Prescriptions Disp Refills  . traZODone (DESYREL) 50 MG tablet [Pharmacy Med Name: TRAZODONE 50MG  TABLETS] 90 tablet 0    Sig: TAKE 1 TABLET(50 MG) BY MOUTH AT BEDTIME AS NEEDED FOR SLEEP     Psychiatry: Antidepressants - Serotonin Modulator Passed - 09/17/2019  8:01 AM      Passed - Completed PHQ-2 or PHQ-9 in the last 360 days.      Passed - Valid encounter within last 6 months    Recent Outpatient Visits          4 days ago Dyspareunia in female   [facility] [place], [place], [place]   2 months ago Dyspareunia in female   [facility] at [place], [place], MD   3 months ago Chronic insomnia   [facility] at [facility], [facility], MD   6 months ago Essential hypertension   Primary Care at [facility], [facility], MD   10 months ago Situational anxiety   Primary Care at [facility], [place], [place]

## 2019-09-22 ENCOUNTER — Telehealth: Payer: Self-pay | Admitting: Emergency Medicine

## 2019-09-22 NOTE — Telephone Encounter (Signed)
Patient is calling to let us know thank you for recent ob/gyn Referral , but she prefers female. Patient is asking for a new referral to an office with female Provider

## 2019-09-22 NOTE — Telephone Encounter (Signed)
I edit this referral for the patient. Not sure if there is anything else you or I need to do. Patient was requesting a female provider and the original facility did not have any female at this time. Please look at this referral to make sure I done it correctly.  Thanks [employer]

## 2019-09-25 ENCOUNTER — Telehealth: Payer: Self-pay | Admitting: Emergency Medicine

## 2019-09-25 DIAGNOSIS — N941 Unspecified dyspareunia: Secondary | ICD-10-CM

## 2019-09-25 NOTE — Telephone Encounter (Signed)
Pt called and is needing another referral to a OBGYN. The office she was referred to all they had were female and pt is wanting a female. Pt is wanting to know something asap. Please advise.

## 2019-09-25 NOTE — Telephone Encounter (Signed)
Pt just called and stated she was returning the call from the office. Pt is ready to be called. Please advise.

## 2019-09-25 NOTE — Telephone Encounter (Signed)
Patient has been informed that her labs looks good and her referral has been sent [facility] at [phone]

## 2019-10-05 ENCOUNTER — Ambulatory Visit: Payer: 59 | Admitting: Obstetrics and Gynecology

## 2019-10-10 ENCOUNTER — Other Ambulatory Visit: Payer: Self-pay | Admitting: Emergency Medicine

## 2019-10-10 DIAGNOSIS — M25519 Pain in unspecified shoulder: Secondary | ICD-10-CM

## 2019-10-23 ENCOUNTER — Ambulatory Visit (INDEPENDENT_AMBULATORY_CARE_PROVIDER_SITE_OTHER): Payer: 59 | Admitting: Emergency Medicine

## 2019-10-23 ENCOUNTER — Encounter: Payer: Self-pay | Admitting: Emergency Medicine

## 2019-10-23 ENCOUNTER — Other Ambulatory Visit: Payer: Self-pay

## 2019-10-23 VITALS — BP 133/71 | HR 72 | Temp 98.8°F | Resp 16 | Ht 60.0 in | Wt 123.0 lb

## 2019-10-23 DIAGNOSIS — F418 Other specified anxiety disorders: Secondary | ICD-10-CM | POA: Diagnosis not present

## 2019-10-23 DIAGNOSIS — Z760 Encounter for issue of repeat prescription: Secondary | ICD-10-CM

## 2019-10-23 DIAGNOSIS — Z7189 Other specified counseling: Secondary | ICD-10-CM

## 2019-10-23 DIAGNOSIS — F419 Anxiety disorder, unspecified: Secondary | ICD-10-CM

## 2019-10-23 DIAGNOSIS — M25519 Pain in unspecified shoulder: Secondary | ICD-10-CM

## 2019-10-23 DIAGNOSIS — F329 Major depressive disorder, single episode, unspecified: Secondary | ICD-10-CM

## 2019-10-23 DIAGNOSIS — F32A Depression, unspecified: Secondary | ICD-10-CM

## 2019-10-23 MED ORDER — CYCLOBENZAPRINE HCL 5 MG PO TABS: 5.0000 mg | ORAL_TABLET | Freq: Three times a day (TID) | ORAL | 1 refills | Status: DC | PRN

## 2019-10-23 MED ORDER — ALPRAZOLAM 0.5 MG PO TABS: 0.5000 mg | ORAL_TABLET | Freq: Every day | ORAL | 1 refills | Status: DC | PRN

## 2019-10-23 MED ORDER — FLUOXETINE HCL 20 MG PO CAPS: 20.0000 mg | ORAL_CAPSULE | Freq: Every day | ORAL | 1 refills | Status: DC

## 2019-10-23 NOTE — Progress Notes (Signed)
Katie Gardner 64 y.o.   Chief Complaint  Patient presents with  . Anxiety    per patient she has an appointment at OB/GYN on 11/15/2019 and mammogram  . Medication Refill    PEND    HISTORY OF PRESENT ILLNESS: This is a 64 y.o. female here for follow-up of generalized anxiety and medication refill. Saw gynecologist last week and has follow-up pelvic ultrasound and appointment on November 15, 2019.  Doing well. Has no new complaints or medical concerns today. Not yet vaccinated against Covid infection.  HPI   [name]   Medication Sig Start Date End Date Taking? Authorizing Provider  ALPRAZolam Prudy Feeler) 0.5 MG tablet Take 1 tablet (0.5 mg total) by mouth daily as needed for anxiety. 10/23/19  Yes Michail Boyte, Eilleen Kempf, MD  cyclobenzaprine (FLEXERIL) 5 MG tablet Take 1 tablet (5 mg total) by mouth 3 (three) times daily as needed for muscle spasms. 10/23/19  Yes Christalynn Boise, Eilleen Kempf, MD  ibuprofen (ADVIL) 600 MG tablet TAKE 1 TABLET(600 MG) BY MOUTH DAILY AS NEEDED FOR MODERATE PAIN 10/10/19  Yes Cornelio Parkerson, Eilleen Kempf, MD  lisinopril (PRINIVIL,ZESTRIL) 10 MG tablet Take 1 tablet (10 mg total) by mouth daily. 04/22/18  Yes Dontarius Sheley, Eilleen Kempf, MD  loratadine (CLARITIN) 10 MG tablet Take 1 tablet (10 mg total) by mouth daily. 09/12/18  Yes Mette Southgate, Eilleen Kempf, MD  rosuvastatin (CRESTOR) 10 MG tablet TAKE 1 TABLET(10 MG) BY MOUTH DAILY 09/01/19  Yes Georgina Quint, MD  traZODone (DESYREL) 50 MG tablet TAKE 1 TABLET(50 MG) BY MOUTH AT BEDTIME AS NEEDED FOR SLEEP 09/17/19  Yes Dailyn Reith, Eilleen Kempf, MD  conjugated estrogens (PREMARIN) vaginal cream Place 1 Applicatorful vaginally daily. Patient not taking: Reported on 10/23/2019 07/19/19   Georgina Quint, MD  FLUoxetine (PROZAC) 20 MG capsule Take 1 capsule (20 mg total) by mouth daily. 10/23/19 01/21/20  Georgina Quint, MD    Allergies  Allergen Reactions  . Hydrocodone Nausea And Vomiting    dizzy  .  Mucinex Dm [Dm-Guaifenesin Er] Swelling    Sob, throat closing    Patient Active Problem List   Diagnosis Date Noted  . Chronic musculoskeletal pain 09/13/2019  . 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 Dyspareunia in female 09/13/2019  . Skin tags, multiple acquired 07/27/2019  . Chronic insomnia 05/31/2019  . Essential hypertension 12/17/2017  . Seasonal allergies 12/17/2017  . Anxiety and depression 11/13/2016  . Chronic anxiety 11/13/2016  . Chronic sore throat 11/13/2016  . Hot flashes   . Situational anxiety   . Dyslipidemia     Past Medical History:  Diagnosis Date  . Allergy    seasonal  . Anxiety   . Depression   . Hematuria   . Hot flashes   . Hx of cardiovascular stress test    Lex MV 12/13:  EF 68%, mild apical thinning, no ischemia  . Hyperlipidemia     Past Surgical History:  Procedure Laterality Date  . BREAST LUMPECTOMY     right breast, not cancer  . Carpel tunnel    . ECTOPIC PREGNANCY SURGERY      Social History   Socioeconomic History  . Marital status: Legally Separated    Spouse name: Not on file  . Number of children: 1  . Years of education: 8th grade  . Highest education level: Not on file  Occupational History    Employer: UNEMPLOYED  Tobacco Use  . Smoking status: Former Games developer  . Smokeless tobacco: Never  Used  Vaping Use  . Vaping Use: Never used  Substance and Sexual Activity  . Alcohol use: No  . Drug use: No  . Sexual activity: Yes  Other Topics Concern  . Not on file  Social History Narrative  . Not on file   Social Determinants of Health   Financial Resource Strain:   . Difficulty of Paying Living Expenses:   Food Insecurity:   . Worried About Programme researcher, broadcasting/film/video in the Last Year:   . Barista in the Last Year:   Transportation Needs:   . Freight forwarder (Medical):   Marland Kitchen Lack of Transportation (Non-Medical):   Physical Activity:   . Days of Exercise per Week:   . Minutes of Exercise per  Session:   Stress:   . Feeling of Stress :   Social Connections:   . Frequency of Communication with Friends and Family:   . Frequency of Social Gatherings with Friends and Family:   . Attends Religious Services:   . Active Member of Clubs or Organizations:   . Attends Banker Meetings:   Marland Kitchen Marital Status:   Intimate Partner Violence:   . Fear of Current or Ex-Partner:   . Emotionally Abused:   Marland Kitchen Physically Abused:   . Sexually Abused:     Family History  Problem Relation Age of Onset  . Heart disease Mother   . Diabetes Mother   . Esophageal cancer Father        83's  . Colon cancer Neg Hx      Review of Systems  Constitutional: Negative.  Negative for chills and fever.  HENT: Negative.  Negative for congestion and sore throat.   Respiratory: Negative.  Negative for cough and shortness of breath.   Cardiovascular: Negative.  Negative for chest pain and palpitations.  Gastrointestinal: Negative.  Negative for abdominal pain, diarrhea, nausea and vomiting.  Genitourinary: Negative for dysuria and hematuria.  Musculoskeletal: Negative.   Skin: Negative.  Negative for rash.  Neurological: Negative for dizziness and headaches.  All other systems reviewed and are negative.  Today's Vitals   10/23/19 1425  BP: 133/71  Pulse: 72  Resp: 16  Temp: 98.8 F (37.1 C)  TempSrc: Temporal  SpO2: 98%  Weight: 123 lb (55.8 kg)  Height: 5' (1.524 m)   Body mass index is 24.02 kg/m.   Physical Exam Vitals reviewed.  Constitutional:      Appearance: Normal appearance.  HENT:     Head: Normocephalic.  Eyes:     Extraocular Movements: Extraocular movements intact.     Pupils: Pupils are equal, round, and reactive to light.  Cardiovascular:     Rate and Rhythm: Normal rate and regular rhythm.  Pulmonary:     Effort: Pulmonary effort is normal.  Musculoskeletal:        General: Normal range of motion.     Cervical back: Normal range of motion and neck  supple.  Skin:    General: Skin is warm and dry.     Capillary Refill: Capillary refill takes less than 2 seconds.  Neurological:     General: No focal deficit present.     Mental Status: She is alert and oriented to person, place, and time.  Psychiatric:        Mood and Affect: Mood normal.        Behavior: Behavior normal.      ASSESSMENT & PLAN: Clinically stable.  No medical concerns identified  during this visit.  Continue present medications.  No changes.  Follow-up with gynecologist as scheduled next month. Follow-up in 6 months.  Kristie was seen today for anxiety and medication refill.  Diagnoses and all orders for this visit:  Anxiety and depression -     ALPRAZolam (XANAX) 0.5 MG tablet; Take 1 tablet (0.5 mg total) by mouth daily as needed for anxiety. -     FLUoxetine (PROZAC) 20 MG capsule; Take 1 capsule (20 mg total) by mouth daily.  Situational anxiety -     ALPRAZolam (XANAX) 0.5 MG tablet; Take 1 tablet (0.5 mg total) by mouth daily as needed for anxiety.  Arthralgia of shoulder, unspecified laterality -     cyclobenzaprine (FLEXERIL) 5 MG tablet; Take 1 tablet (5 mg total) by mouth 3 (three) times daily as needed for muscle spasms.  Encounter for medication refill  Advice given about COVID-19 virus infection      Patient Instructions   Call [facility] Gyne[facility] to schedule appointment [phone].     If you have lab work done today you will be contacted with your lab results within the next 2 weeks.  If you have not heard from [place] then please contact us. The fastest way to get your results is to register for My Chart.   IF you received an x-ray today, you will receive an invoice from [facility]. Please contact [facility] at [phone] with questions or concerns regarding your invoice.   IF you received labwork today, you will receive an invoice from [place]. Please contact [facility] at [phone] with questions or  concerns regarding your invoice.   Our billing staff will not be able to assist you with questions regarding bills from these companies.  You will be contacted with the lab results as soon as they are available. The fastest way to get your results is to activate your My Chart account. Instructions are located on the last page of this paperwork. If you have not heard from [place] regarding the results in 2 weeks, please contact this office.     Health Maintenance, Female Adopting a healthy lifestyle and getting preventive care are important in promoting health and wellness. Ask your health care provider about:  The right schedule for you to have regular tests and exams.  Things you can do on your own to prevent diseases and keep yourself healthy. What should I know about diet, weight, and exercise? Eat a healthy diet   Eat a diet that includes plenty of vegetables, fruits, low-fat dairy products, and lean protein.  Do not eat a lot of foods that are high in solid fats, added sugars, or sodium. Maintain a healthy weight Body mass index (BMI) is used to identify weight problems. It estimates body fat based on height and weight. Your health care provider can help determine your BMI and help you achieve or maintain a healthy weight. Get regular exercise Get regular exercise. This is one of the most important things you can do for your health. Most adults should:  Exercise for at least 150 minutes each week. The exercise should increase your heart rate and make you sweat (moderate-intensity exercise).  Do strengthening exercises at least twice a week. This is in addition to the moderate-intensity exercise.  Spend less time sitting. Even light physical activity can be beneficial. Watch cholesterol and blood lipids Have your blood tested for lipids and cholesterol at 64 years of age, then have this test every 5 years. Have your cholesterol levels  checked more often if:  Your lipid or  cholesterol levels are high.  You are older than 64 years of age.  You are at high risk for heart disease. What should I know about cancer screening? Depending on your health history and family history, you may need to have cancer screening at various ages. This may include screening for:  Breast cancer.  Cervical cancer.  Colorectal cancer.  Skin cancer.  Lung cancer. What should I know about heart disease, diabetes, and high blood pressure? Blood pressure and heart disease  High blood pressure causes heart disease and increases the risk of stroke. This is more likely to develop in people who have high blood pressure readings, are of African descent, or are overweight.  Have your blood pressure checked: ? Every 3-5 years if you are 7018-64 years of age. ? Every year if you are 64 years old or older. Diabetes Have regular diabetes screenings. This checks your fasting blood sugar level. Have the screening done:  Once every three years after age 64 if you are at a normal weight and have a low risk for diabetes.  More often and at a younger age if you are overweight or have a high risk for diabetes. What should I know about preventing infection? Hepatitis B If you have a higher risk for hepatitis B, you should be screened for this virus. Talk with your health care provider to find out if you are at risk for hepatitis B infection. Hepatitis C Testing is recommended for:  Everyone born from [address]1945 through 1965.  Anyone with known risk factors for hepatitis C. Sexually transmitted infections (STIs)  Get screened for STIs, including gonorrhea and chlamydia, if: ? You are sexually active and are younger than 64 years of age. ? You are older than 64 years of age and your health care provider tells you that you are at risk for this type of infection. ? Your sexual activity has changed since you were last screened, and you are at increased risk for chlamydia or gonorrhea. Ask your  health care provider if you are at risk.  Ask your health care provider about whether you are at high risk for HIV. Your health care provider may recommend a prescription medicine to help prevent HIV infection. If you choose to take medicine to prevent HIV, you should first get tested for HIV. You should then be tested every 3 months for as long as you are taking the medicine. Pregnancy  If you are about to stop having your period (premenopausal) and you may become pregnant, seek counseling before you get pregnant.  Take 400 to 800 micrograms (mcg) of folic acid every day if you become pregnant.  Ask for birth control (contraception) if you want to prevent pregnancy. Osteoporosis and menopause Osteoporosis is a disease in which the bones lose minerals and strength with aging. This can result in bone fractures. If you are 64 years old or older, or if you are at risk for osteoporosis and fractures, ask your health care provider if you should:  Be screened for bone loss.  Take a calcium or vitamin D supplement to lower your risk of fractures.  Be given hormone replacement therapy (HRT) to treat symptoms of menopause. Follow these instructions at home: Lifestyle  Do not use any products that contain nicotine or tobacco, such as cigarettes, e-cigarettes, and chewing tobacco. If you need help quitting, ask your health care provider.  Do not use street drugs.  Do not share  needles.  Ask your health care provider for help if you need support or information about quitting drugs. Alcohol use  Do not drink alcohol if: ? Your health care provider tells you not to drink. ? You are pregnant, may be pregnant, or are planning to become pregnant.  If you drink alcohol: ? Limit how much you use to 0-1 drink a day. ? Limit intake if you are breastfeeding.  Be aware of how much alcohol is in your drink. In the [place] one drink equals one 12 oz bottle of beer (355 mL), one 5 oz glass of wine (148  mL), or one 1 oz glass of hard liquor (44 mL). General instructions  Schedule regular health, dental, and eye exams.  Stay current with your vaccines.  Tell your health care provider if: ? You often feel depressed. ? You have ever been abused or do not feel safe at home. Summary  Adopting a healthy lifestyle and getting preventive care are important in promoting health and wellness.  Follow your health care provider's instructions about healthy diet, exercising, and getting tested or screened for diseases.  Follow your health care provider's instructions on monitoring your cholesterol and blood pressure. This information is not intended to replace advice given to you by your health care provider. Make sure you discuss any questions you have with your health care provider. Document Revised: 02/16/2018 Document Reviewed: 02/16/2018 Elsevier Patient Education  2020 Elsevier Inc.      Edwina Barth, MD [facility]

## 2019-10-23 NOTE — Patient Instructions (Addendum)
Call [facility] Gynecology to schedule appointment [phone].     If you have lab work done today you will be contacted with your lab results within the next 2 weeks.  If you have not heard from [place] then please contact us. The fastest way to get your results is to register for My Chart.   IF you received an x-ray today, you will receive an invoice from [facility]. Please contact [facility] at [phone] with questions or concerns regarding your invoice.   IF you received labwork today, you will receive an invoice from [place]. Please contact [facility] at [phone] with questions or concerns regarding your invoice.   Our billing staff will not be able to assist you with questions regarding bills from these companies.  You will be contacted with the lab results as soon as they are available. The fastest way to get your results is to activate your My Chart account. Instructions are located on the last page of this paperwork. If you have not heard from [place] regarding the results in 2 weeks, please contact this office.     Health Maintenance, Female Adopting a healthy lifestyle and getting preventive care are important in promoting health and wellness. Ask your health care provider about:  The right schedule for you to have regular tests and exams.  Things you can do on your own to prevent diseases and keep yourself healthy. What should I know about diet, weight, and exercise? Eat a healthy diet   Eat a diet that includes plenty of vegetables, fruits, low-fat dairy products, and lean protein.  Do not eat a lot of foods that are high in solid fats, added sugars, or sodium. Maintain a healthy weight Body mass index (BMI) is used to identify weight problems. It estimates body fat based on height and weight. Your health care provider can help determine your BMI and help you achieve or maintain a healthy weight. Get regular exercise Get regular exercise. This is one  of the most important things you can do for your health. Most adults should:  Exercise for at least 150 minutes each week. The exercise should increase your heart rate and make you sweat (moderate-intensity exercise).  Do strengthening exercises at least twice a week. This is in addition to the moderate-intensity exercise.  Spend less time sitting. Even light physical activity can be beneficial. Watch cholesterol and blood lipids Have your blood tested for lipids and cholesterol at 64 years of age, then have this test every 5 years. Have your cholesterol levels checked more often if:  Your lipid or cholesterol levels are high.  You are older than 64 years of age.  You are at high risk for heart disease. What should I know about cancer screening? Depending on your health history and family history, you may need to have cancer screening at various ages. This may include screening for:  Breast cancer.  Cervical cancer.  Colorectal cancer.  Skin cancer.  Lung cancer. What should I know about heart disease, diabetes, and high blood pressure? Blood pressure and heart disease  High blood pressure causes heart disease and increases the risk of stroke. This is more likely to develop in people who have high blood pressure readings, are of African descent, or are overweight.  Have your blood pressure checked: ? Every 3-5 years if you are 54-50 years of age. ? Every year if you are 82 years old or older. Diabetes Have regular diabetes screenings. This checks your fasting blood sugar level.  Have the screening done:  Once every three years after age 20 if you are at a normal weight and have a low risk for diabetes.  More often and at a younger age if you are overweight or have a high risk for diabetes. What should I know about preventing infection? Hepatitis B If you have a higher risk for hepatitis B, you should be screened for this virus. Talk with your health care provider to find  out if you are at risk for hepatitis B infection. Hepatitis C Testing is recommended for:  Everyone born from [birth year] through 1965.  Anyone with known risk factors for hepatitis C. Sexually transmitted infections (STIs)  Get screened for STIs, including gonorrhea and chlamydia, if: ? You are sexually active and are younger than 64 years of age. ? You are older than 64 years of age and your health care provider tells you that you are at risk for this type of infection. ? Your sexual activity has changed since you were last screened, and you are at increased risk for chlamydia or gonorrhea. Ask your health care provider if you are at risk.  Ask your health care provider about whether you are at high risk for HIV. Your health care provider may recommend a prescription medicine to help prevent HIV infection. If you choose to take medicine to prevent HIV, you should first get tested for HIV. You should then be tested every 3 months for as long as you are taking the medicine. Pregnancy  If you are about to stop having your period (premenopausal) and you may become pregnant, seek counseling before you get pregnant.  Take 400 to 800 micrograms (mcg) of folic acid every day if you become pregnant.  Ask for birth control (contraception) if you want to prevent pregnancy. Osteoporosis and menopause Osteoporosis is a disease in which the bones lose minerals and strength with aging. This can result in bone fractures. If you are 79 years old or older, or if you are at risk for osteoporosis and fractures, ask your health care provider if you should:  Be screened for bone loss.  Take a calcium or vitamin D supplement to lower your risk of fractures.  Be given hormone replacement therapy (HRT) to treat symptoms of menopause. Follow these instructions at home: Lifestyle  Do not use any products that contain nicotine or tobacco, such as cigarettes, e-cigarettes, and chewing tobacco. If you need help  quitting, ask your health care provider.  Do not use street drugs.  Do not share needles.  Ask your health care provider for help if you need support or information about quitting drugs. Alcohol use  Do not drink alcohol if: ? Your health care provider tells you not to drink. ? You are pregnant, may be pregnant, or are planning to become pregnant.  If you drink alcohol: ? Limit how much you use to 0-1 drink a day. ? Limit intake if you are breastfeeding.  Be aware of how much alcohol is in your drink. In the [place] one drink equals one 12 oz bottle of beer (355 mL), one 5 oz glass of wine (148 mL), or one 1 oz glass of hard liquor (44 mL). General instructions  Schedule regular health, dental, and eye exams.  Stay current with your vaccines.  Tell your health care provider if: ? You often feel depressed. ? You have ever been abused or do not feel safe at home. Summary  Adopting a healthy lifestyle and getting preventive care  are important in promoting health and wellness.  Follow your health care provider's instructions about healthy diet, exercising, and getting tested or screened for diseases.  Follow your health care provider's instructions on monitoring your cholesterol and blood pressure. This information is not intended to replace advice given to you by your health care provider. Make sure you discuss any questions you have with your health care provider. Document Revised: 02/16/2018 Document Reviewed: 02/16/2018 Elsevier Patient Education  2020 [employer].

## 2019-11-20 ENCOUNTER — Other Ambulatory Visit: Payer: Self-pay | Admitting: Emergency Medicine

## 2019-11-20 DIAGNOSIS — F5104 Psychophysiologic insomnia: Secondary | ICD-10-CM

## 2019-11-21 ENCOUNTER — Ambulatory Visit (INDEPENDENT_AMBULATORY_CARE_PROVIDER_SITE_OTHER): Payer: 59 | Admitting: Emergency Medicine

## 2019-11-21 ENCOUNTER — Ambulatory Visit: Payer: 59 | Admitting: Emergency Medicine

## 2019-11-21 ENCOUNTER — Encounter: Payer: Self-pay | Admitting: Emergency Medicine

## 2019-11-21 ENCOUNTER — Other Ambulatory Visit: Payer: Self-pay

## 2019-11-21 VITALS — BP 125/70 | HR 62 | Temp 98.4°F | Resp 16 | Ht 61.0 in | Wt 124.4 lb

## 2019-11-21 DIAGNOSIS — H01134 Eczematous dermatitis of left upper eyelid: Secondary | ICD-10-CM | POA: Diagnosis not present

## 2019-11-21 MED ORDER — CLOTRIMAZOLE-BETAMETHASONE 1-0.05 % EX CREA: TOPICAL_CREAM | Freq: Two times a day (BID) | CUTANEOUS | 2 refills | Status: AC

## 2019-11-21 NOTE — Patient Instructions (Addendum)
   If you have lab work done today you will be contacted with your lab results within the next 2 weeks.  If you have not heard from us then please contact us. The fastest way to get your results is to register for My Chart.   IF you received an x-ray today, you will receive an invoice from [facility]. Please contact [facility] at [phone] with questions or concerns regarding your invoice.   IF you received labwork today, you will receive an invoice from [facility]. Please contact [facility] at [phone] with questions or concerns regarding your invoice.   Our billing staff will not be able to assist you with questions regarding bills from these companies.  You will be contacted with the lab results as soon as they are available. The fastest way to get your results is to activate your My Chart account. Instructions are located on the last page of this paperwork. If you have not heard from us regarding the results in 2 weeks, please contact this office.      Hypertension, Adult High blood pressure (hypertension) is when the force of blood pumping through the arteries is too strong. The arteries are the blood vessels that carry blood from the heart throughout the body. Hypertension forces the heart to work harder to pump blood and may cause arteries to become narrow or stiff. Untreated or uncontrolled hypertension can cause a heart attack, heart failure, a stroke, kidney disease, and other problems. A blood pressure reading consists of a higher number over a lower number. Ideally, your blood pressure should be below 120/80. The first ("top") number is called the systolic pressure. It is a measure of the pressure in your arteries as your heart beats. The second ("bottom") number is called the diastolic pressure. It is a measure of the pressure in your arteries as the heart relaxes. What are the causes? The exact cause of this condition is not known. There are some conditions  that result in or are related to high blood pressure. What increases the risk? Some risk factors for high blood pressure are under your control. The following factors may make you more likely to develop this condition:  Smoking.  Having type 2 diabetes mellitus, high cholesterol, or both.  Not getting enough exercise or physical activity.  Being overweight.  Having too much fat, sugar, calories, or salt (sodium) in your diet.  Drinking too much alcohol. Some risk factors for high blood pressure may be difficult or impossible to change. Some of these factors include:  Having chronic kidney disease.  Having a family history of high blood pressure.  Age. Risk increases with age.  Race. You may be at higher risk if you are African American.  Gender. Men are at higher risk than women before age 45. After age 65, women are at higher risk than men.  Having obstructive sleep apnea.  Stress. What are the signs or symptoms? High blood pressure may not cause symptoms. Very high blood pressure (hypertensive crisis) may cause:  Headache.  Anxiety.  Shortness of breath.  Nosebleed.  Nausea and vomiting.  Vision changes.  Severe chest pain.  Seizures. How is this diagnosed? This condition is diagnosed by measuring your blood pressure while you are seated, with your arm resting on a flat surface, your legs uncrossed, and your feet flat on the floor. The cuff of the blood pressure monitor will be placed directly against the skin of your upper arm at the level of your   heart. It should be measured at least twice using the same arm. Certain conditions can cause a difference in blood pressure between your right and left arms. Certain factors can cause blood pressure readings to be lower or higher than normal for a short period of time:  When your blood pressure is higher when you are in a health care provider's office than when you are at home, this is called white coat hypertension.  Most people with this condition do not need medicines.  When your blood pressure is higher at home than when you are in a health care provider's office, this is called masked hypertension. Most people with this condition may need medicines to control blood pressure. If you have a high blood pressure reading during one visit or you have normal blood pressure with other risk factors, you may be asked to:  Return on a different day to have your blood pressure checked again.  Monitor your blood pressure at home for 1 week or longer. If you are diagnosed with hypertension, you may have other blood or imaging tests to help your health care provider understand your overall risk for other conditions. How is this treated? This condition is treated by making healthy lifestyle changes, such as eating healthy foods, exercising more, and reducing your alcohol intake. Your health care provider may prescribe medicine if lifestyle changes are not enough to get your blood pressure under control, and if:  Your systolic blood pressure is above 130.  Your diastolic blood pressure is above 80. Your personal target blood pressure may vary depending on your medical conditions, your age, and other factors. Follow these instructions at home: Eating and drinking   Eat a diet that is high in fiber and potassium, and low in sodium, added sugar, and fat. An example eating plan is called the DASH (Dietary Approaches to Stop Hypertension) diet. To eat this way: ? Eat plenty of fresh fruits and vegetables. Try to fill one half of your plate at each meal with fruits and vegetables. ? Eat whole grains, such as whole-wheat pasta, brown rice, or whole-grain bread. Fill about one fourth of your plate with whole grains. ? Eat or drink low-fat dairy products, such as skim milk or low-fat yogurt. ? Avoid fatty cuts of meat, processed or cured meats, and poultry with skin. Fill about one fourth of your plate with lean proteins, such  as fish, chicken without skin, beans, eggs, or tofu. ? Avoid pre-made and processed foods. These tend to be higher in sodium, added sugar, and fat.  Reduce your daily sodium intake. Most people with hypertension should eat less than 1,500 mg of sodium a day.  Do not drink alcohol if: ? Your health care provider tells you not to drink. ? You are pregnant, may be pregnant, or are planning to become pregnant.  If you drink alcohol: ? Limit how much you use to:  0-1 drink a day for women.  0-2 drinks a day for men. ? Be aware of how much alcohol is in your drink. In the [place] one drink equals one 12 oz bottle of beer (355 mL), one 5 oz glass of wine (148 mL), or one 1 oz glass of hard liquor (44 mL). Lifestyle   Work with your health care provider to maintain a healthy body weight or to lose weight. Ask what an ideal weight is for you.  Get at least 30 minutes of exercise most days of the week. Activities may include walking, swimming,   or biking.  Include exercise to strengthen your muscles (resistance exercise), such as Pilates or lifting weights, as part of your weekly exercise routine. Try to do these types of exercises for 30 minutes at least 3 days a week.  Do not use any products that contain nicotine or tobacco, such as cigarettes, e-cigarettes, and chewing tobacco. If you need help quitting, ask your health care provider.  Monitor your blood pressure at home as told by your health care provider.  Keep all follow-up visits as told by your health care provider. This is important. Medicines  Take over-the-counter and prescription medicines only as told by your health care provider. Follow directions carefully. Blood pressure medicines must be taken as prescribed.  Do not skip doses of blood pressure medicine. Doing this puts you at risk for problems and can make the medicine less effective.  Ask your health care provider about side effects or reactions to medicines that you  should watch for. Contact a health care provider if you:  Think you are having a reaction to a medicine you are taking.  Have headaches that keep coming back (recurring).  Feel dizzy.  Have swelling in your ankles.  Have trouble with your vision. Get help right away if you:  Develop a severe headache or confusion.  Have unusual weakness or numbness.  Feel faint.  Have severe pain in your chest or abdomen.  Vomit repeatedly.  Have trouble breathing. Summary  Hypertension is when the force of blood pumping through your arteries is too strong. If this condition is not controlled, it may put you at risk for serious complications.  Your personal target blood pressure may vary depending on your medical conditions, your age, and other factors. For most people, a normal blood pressure is less than 120/80.  Hypertension is treated with lifestyle changes, medicines, or a combination of both. Lifestyle changes include losing weight, eating a healthy, low-sodium diet, exercising more, and limiting alcohol. This information is not intended to replace advice given to you by your health care provider. Make sure you discuss any questions you have with your health care provider. Document Revised: 11/03/2017 Document Reviewed: 11/03/2017 [employer] Patient Education  2020 [employer]  

## 2019-11-21 NOTE — Progress Notes (Signed)
Sashay Bauch 64 y.o.   Chief Complaint  Patient presents with  . Rash    on left eye with itching for 5 days, patient used [facility] Jock Itch Cream     HISTORY OF PRESENT ILLNESS: This is a 64 y.o. female complaining of itchy rash to left eyelid for the past 5 days. No other associated symptoms. No other complaints or medical concerns.  HPI   [name]   Medication Sig Start Date End Date Taking? Authorizing Provider  ALPRAZolam Prudy Feeler) 0.5 MG tablet Take 1 tablet (0.5 mg total) by mouth daily as needed for anxiety. 10/23/19  Yes Byard Carranza, Eilleen Kempf, MD  cyclobenzaprine (FLEXERIL) 5 MG tablet Take 1 tablet (5 mg total) by mouth 3 (three) times daily as needed for muscle spasms. 10/23/19  Yes Sharnika Binney, Eilleen Kempf, MD  FLUoxetine (PROZAC) 20 MG capsule Take 1 capsule (20 mg total) by mouth daily. 10/23/19 01/21/20 Yes Trebor Galdamez, Eilleen Kempf, MD  ibuprofen (ADVIL) 600 MG tablet TAKE 1 TABLET(600 MG) BY MOUTH DAILY AS NEEDED FOR MODERATE PAIN 10/10/19  Yes Alyx Mcguirk, Eilleen Kempf, MD  loratadine (CLARITIN) 10 MG tablet Take 1 tablet (10 mg total) by mouth daily. 09/12/18  Yes Tayte Mcwherter, Eilleen Kempf, MD  rosuvastatin (CRESTOR) 10 MG tablet TAKE 1 TABLET(10 MG) BY MOUTH DAILY 09/01/19  Yes Georgina Quint, MD  traZODone (DESYREL) 50 MG tablet TAKE 1 TABLET(50 MG) BY MOUTH AT BEDTIME AS NEEDED FOR SLEEP 11/20/19  Yes Anthony Roland, Eilleen Kempf, MD  conjugated estrogens (PREMARIN) vaginal cream Place 1 Applicatorful vaginally daily. Patient not taking: Reported on 11/21/2019 07/19/19   Georgina Quint, MD  lisinopril (PRINIVIL,ZESTRIL) 10 MG tablet Take 1 tablet (10 mg total) by mouth daily. Patient not taking: Reported on 11/21/2019 04/22/18   Georgina Quint, MD    Allergies  Allergen Reactions  . Hydrocodone Nausea And Vomiting    dizzy  . Mucinex Dm [Dm-Guaifenesin Er] Swelling    Sob, throat closing    Patient Active Problem List   Diagnosis Date Noted  .  Chronic musculoskeletal pain 09/13/2019  . 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 Dyspareunia in female 09/13/2019  . Skin tags, multiple acquired 07/27/2019  . Chronic insomnia 05/31/2019  . Essential hypertension 12/17/2017  . Seasonal allergies 12/17/2017  . Anxiety and depression 11/13/2016  . Chronic anxiety 11/13/2016  . Chronic sore throat 11/13/2016  . Hot flashes   . Situational anxiety   . Dyslipidemia     Past Medical History:  Diagnosis Date  . Allergy    seasonal  . Anxiety   . Depression   . Hematuria   . Hot flashes   . Hx of cardiovascular stress test    Lex MV 12/13:  EF 68%, mild apical thinning, no ischemia  . Hyperlipidemia     Past Surgical History:  Procedure Laterality Date  . BREAST LUMPECTOMY     right breast, not cancer  . Carpel tunnel    . ECTOPIC PREGNANCY SURGERY      Social History   Socioeconomic History  . Marital status: Legally Separated    Spouse name: Not on file  . Number of children: 1  . Years of education: 8th grade  . Highest education level: Not on file  Occupational History    Employer: UNEMPLOYED  Tobacco Use  . Smoking status: Former Games developer  . Smokeless tobacco: Never Used  Vaping Use  . Vaping Use: Never used  Substance and Sexual Activity  . Alcohol  use: No  . Drug use: No  . Sexual activity: Yes  Other Topics Concern  . Not on file  Social History Narrative  . Not on file   Social Determinants of Health   Financial Resource Strain:   . Difficulty of Paying Living Expenses: Not on file  Food Insecurity:   . Worried About Programme researcher, broadcasting/film/video in the Last Year: Not on file  . Ran Out of Food in the Last Year: Not on file  Transportation Needs:   . Lack of Transportation (Medical): Not on file  . Lack of Transportation (Non-Medical): Not on file  Physical Activity:   . Days of Exercise per Week: Not on file  . Minutes of Exercise per Session: Not on file  Stress:   . Feeling of Stress : Not  on file  Social Connections:   . Frequency of Communication with Friends and Family: Not on file  . Frequency of Social Gatherings with Friends and Family: Not on file  . Attends Religious Services: Not on file  . Active Member of Clubs or Organizations: Not on file  . Attends Banker Meetings: Not on file  . Marital Status: Not on file  Intimate Partner Violence:   . Fear of Current or Ex-Partner: Not on file  . Emotionally Abused: Not on file  . Physically Abused: Not on file  . Sexually Abused: Not on file    Family History  Problem Relation Age of Onset  . Heart disease Mother   . Diabetes Mother   . Esophageal cancer Father        1's  . Colon cancer Neg Hx      Review of Systems  Constitutional: Negative.  Negative for chills and fever.  HENT: Negative.  Negative for congestion and sore throat.   Respiratory: Negative.  Negative for cough and shortness of breath.   Cardiovascular: Negative.  Negative for chest pain and palpitations.  Gastrointestinal: Negative.  Negative for abdominal pain, diarrhea, nausea and vomiting.  Genitourinary: Negative.  Negative for dysuria.  Skin: Positive for itching and rash.       Left upper eyelid  Neurological: Negative.  Negative for dizziness and headaches.  All other systems reviewed and are negative.  Today's Vitals   11/21/19 1430  BP: 125/70  Pulse: 62  Resp: 16  Temp: 98.4 F (36.9 C)  TempSrc: Temporal  SpO2: 98%  Weight: 124 lb 6.4 oz (56.4 kg)  Height: 5\' 1"  (1.549 m)   Body mass index is 23.51 kg/m.   Physical Exam Vitals reviewed.  Constitutional:      Appearance: Normal appearance.  HENT:     Head: Normocephalic.  Eyes:     Extraocular Movements: Extraocular movements intact.     Conjunctiva/sclera: Conjunctivae normal.     Pupils: Pupils are equal, round, and reactive to light.  Cardiovascular:     Rate and Rhythm: Normal rate.  Pulmonary:     Effort: Pulmonary effort is normal.    Musculoskeletal:        General: Normal range of motion.  Skin:    General: Skin is warm and dry.     Comments: Left upper eyelid: Dry eczematous rash with mild erythema  Neurological:     General: No focal deficit present.     Mental Status: She is alert and oriented to person, place, and time.  Psychiatric:        Mood and Affect: Mood normal.  Behavior: Behavior normal.      ASSESSMENT & PLAN: [name] was seen today for rash.  Diagnoses and all orders for this visit:  Eczematous dermatitis of left upper eyelid -     clotrimazole-betamethasone (LOTRISONE) cream; Apply topically 2 (two) times daily for 7 days.    Patient Instructions       If you have lab work done today you will be contacted with your lab results within the next 2 weeks.  If you have not heard from us then please contact us. The fastest way to get your results is to register for My Chart.   IF you received an x-ray today, you will receive an invoice from [facility]. Please contact [facility] at [phone] with questions or concerns regarding your invoice.   IF you received labwork today, you will receive an invoice from [facility]. Please contact [facility] at [phone] with questions or concerns regarding your invoice.   Our billing staff will not be able to assist you with questions regarding bills from these companies.  You will be contacted with the lab results as soon as they are available. The fastest way to get your results is to activate your My Chart account. Instructions are located on the last page of this paperwork. If you have not heard from us regarding the results in 2 weeks, please contact this office.     Hypertension, Adult High blood pressure (hypertension) is when the force of blood pumping through the arteries is too strong. The arteries are the blood vessels that carry blood from the heart throughout the body. Hypertension forces the heart to work  harder to pump blood and may cause arteries to become narrow or stiff. Untreated or uncontrolled hypertension can cause a heart attack, heart failure, a stroke, kidney disease, and other problems. A blood pressure reading consists of a higher number over a lower number. Ideally, your blood pressure should be below 120/80. The first ("top") number is called the systolic pressure. It is a measure of the pressure in your arteries as your heart beats. The second ("bottom") number is called the diastolic pressure. It is a measure of the pressure in your arteries as the heart relaxes. What are the causes? The exact cause of this condition is not known. There are some conditions that result in or are related to high blood pressure. What increases the risk? Some risk factors for high blood pressure are under your control. The following factors may make you more likely to develop this condition:  Smoking.  Having type 2 diabetes mellitus, high cholesterol, or both.  Not getting enough exercise or physical activity.  Being overweight.  Having too much fat, sugar, calories, or salt (sodium) in your diet.  Drinking too much alcohol. Some risk factors for high blood pressure may be difficult or impossible to change. Some of these factors include:  Having chronic kidney disease.  Having a family history of high blood pressure.  Age. Risk increases with age.  Race. You may be at higher risk if you are African American.  Gender. Men are at higher risk than women before age 64. After age 64, women are at higher risk than men.  Having obstructive sleep apnea.  Stress. What are the signs or symptoms? High blood pressure may not cause symptoms. Very high blood pressure (hypertensive crisis) may cause:  Headache.  Anxiety.  Shortness of breath.  Nosebleed.  Nausea and vomiting.  Vision changes.  Severe chest pain.  Seizures. How is  this diagnosed? This condition is diagnosed by  measuring your blood pressure while you are seated, with your arm resting on a flat surface, your legs uncrossed, and your feet flat on the floor. The cuff of the blood pressure monitor will be placed directly against the skin of your upper arm at the level of your heart. It should be measured at least twice using the same arm. Certain conditions can cause a difference in blood pressure between your right and left arms. Certain factors can cause blood pressure readings to be lower or higher than normal for a short period of time:  When your blood pressure is higher when you are in a health care provider's office than when you are at home, this is called white coat hypertension. Most people with this condition do not need medicines.  When your blood pressure is higher at home than when you are in a health care provider's office, this is called masked hypertension. Most people with this condition may need medicines to control blood pressure. If you have a high blood pressure reading during one visit or you have normal blood pressure with other risk factors, you may be asked to:  Return on a different day to have your blood pressure checked again.  Monitor your blood pressure at home for 1 week or longer. If you are diagnosed with hypertension, you may have other blood or imaging tests to help your health care provider understand your overall risk for other conditions. How is this treated? This condition is treated by making healthy lifestyle changes, such as eating healthy foods, exercising more, and reducing your alcohol intake. Your health care provider may prescribe medicine if lifestyle changes are not enough to get your blood pressure under control, and if:  Your systolic blood pressure is above 130.  Your diastolic blood pressure is above 80. Your personal target blood pressure may vary depending on your medical conditions, your age, and other factors. Follow these instructions at  home: Eating and drinking   Eat a diet that is high in fiber and potassium, and low in sodium, added sugar, and fat. An example eating plan is called the DASH (Dietary Approaches to Stop Hypertension) diet. To eat this way: ? Eat plenty of fresh fruits and vegetables. Try to fill one half of your plate at each meal with fruits and vegetables. ? Eat whole grains, such as whole-wheat pasta, brown rice, or whole-grain bread. Fill about one fourth of your plate with whole grains. ? Eat or drink low-fat dairy products, such as skim milk or low-fat yogurt. ? Avoid fatty cuts of meat, processed or cured meats, and poultry with skin. Fill about one fourth of your plate with lean proteins, such as fish, chicken without skin, beans, eggs, or tofu. ? Avoid pre-made and processed foods. These tend to be higher in sodium, added sugar, and fat.  Reduce your daily sodium intake. Most people with hypertension should eat less than 1,500 mg of sodium a day.  Do not drink alcohol if: ? Your health care provider tells you not to drink. ? You are pregnant, may be pregnant, or are planning to become pregnant.  If you drink alcohol: ? Limit how much you use to:  0-1 drink a day for women.  0-2 drinks a day for men. ? Be aware of how much alcohol is in your drink. In the [place] one drink equals one 12 oz bottle of beer (355 mL), one 5 oz glass of wine (148  mL), or one 1 oz glass of hard liquor (44 mL). Lifestyle   Work with your health care provider to maintain a healthy body weight or to lose weight. Ask what an ideal weight is for you.  Get at least 30 minutes of exercise most days of the week. Activities may include walking, swimming, or biking.  Include exercise to strengthen your muscles (resistance exercise), such as Pilates or lifting weights, as part of your weekly exercise routine. Try to do these types of exercises for 30 minutes at least 3 days a week.  Do not use any products that contain  nicotine or tobacco, such as cigarettes, e-cigarettes, and chewing tobacco. If you need help quitting, ask your health care provider.  Monitor your blood pressure at home as told by your health care provider.  Keep all follow-up visits as told by your health care provider. This is important. Medicines  Take over-the-counter and prescription medicines only as told by your health care provider. Follow directions carefully. Blood pressure medicines must be taken as prescribed.  Do not skip doses of blood pressure medicine. Doing this puts you at risk for problems and can make the medicine less effective.  Ask your health care provider about side effects or reactions to medicines that you should watch for. Contact a health care provider if you:  Think you are having a reaction to a medicine you are taking.  Have headaches that keep coming back (recurring).  Feel dizzy.  Have swelling in your ankles.  Have trouble with your vision. Get help right away if you:  Develop a severe headache or confusion.  Have unusual weakness or numbness.  Feel faint.  Have severe pain in your chest or abdomen.  Vomit repeatedly.  Have trouble breathing. Summary  Hypertension is when the force of blood pumping through your arteries is too strong. If this condition is not controlled, it may put you at risk for serious complications.  Your personal target blood pressure may vary depending on your medical conditions, your age, and other factors. For most people, a normal blood pressure is less than 120/80.  Hypertension is treated with lifestyle changes, medicines, or a combination of both. Lifestyle changes include losing weight, eating a healthy, low-sodium diet, exercising more, and limiting alcohol. This information is not intended to replace advice given to you by your health care provider. Make sure you discuss any questions you have with your health care provider. Document Revised: 11/03/2017  Document Reviewed: 11/03/2017 Elsevier Patient Education  2020 Elsevier Inc.      Edwina Barth, MD [facility]

## 2020-01-06 ENCOUNTER — Other Ambulatory Visit: Payer: Self-pay | Admitting: Emergency Medicine

## 2020-01-06 DIAGNOSIS — M25519 Pain in unspecified shoulder: Secondary | ICD-10-CM

## 2020-01-19 ENCOUNTER — Other Ambulatory Visit: Payer: Self-pay | Admitting: Emergency Medicine

## 2020-01-19 DIAGNOSIS — F419 Anxiety disorder, unspecified: Secondary | ICD-10-CM

## 2020-01-19 DIAGNOSIS — F418 Other specified anxiety disorders: Secondary | ICD-10-CM

## 2020-01-19 NOTE — Telephone Encounter (Signed)
Requested medication (s) are due for refill today: yes  Requested medication (s) are on the active medication list: yes  Last refill:  10/23/19 #30 1 refill  Future visit scheduled: yes  Notes to clinic:  not delegated per protocol     Requested Prescriptions  Pending Prescriptions Disp Refills   ALPRAZolam (XANAX) 0.5 MG tablet [Pharmacy Med Name: ALPRAZOLAM 0.5MG  TABLETS] 30 tablet     Sig: TAKE 1 TABLET(0.5 MG) BY MOUTH DAILY AS NEEDED FOR ANXIETY      Not Delegated - Psychiatry:  Anxiolytics/Hypnotics Failed - 01/19/2020 12:25 PM      Failed - This refill cannot be delegated      Failed - Urine Drug Screen completed in last 360 days      Passed - Valid encounter within last 6 months    Recent Outpatient Visits           1 month ago Eczematous dermatitis of left upper eyelid   [facility] at [facility], Eilleen Kempf, MD   2 months ago Anxiety and depression   [facility] at [place], Eilleen Kempf, MD   4 months ago Dyspareunia in female   [facility] at [place], [place], MD   6 months ago Dyspareunia in female   [facility] at [place], [place], MD   7 months ago Chronic insomnia   [facility] at [facility], Eilleen Kempf, MD       Future Appointments             In 3 months Sagardia, Eilleen Kempf, MD [facility] at [place], [facility]

## 2020-01-19 NOTE — Telephone Encounter (Signed)
Patient is requesting a refill of the following medications: Requested Prescriptions   Pending Prescriptions Disp Refills  . ALPRAZolam (XANAX) 0.5 MG tablet [Pharmacy Med Name: ALPRAZOLAM 0.5MG  TABLETS] 30 tablet     Sig: TAKE 1 TABLET(0.5 MG) BY MOUTH DAILY AS NEEDED FOR ANXIETY    Date of patient request: 01/19/20 Last office visit: 11/21/19 Date of last refill: 10/23/19 Last refill amount: 30 + 1 Follow up time period per chart: 04/22/20

## 2020-02-14 ENCOUNTER — Ambulatory Visit (INDEPENDENT_AMBULATORY_CARE_PROVIDER_SITE_OTHER): Payer: 59 | Admitting: Emergency Medicine

## 2020-02-14 ENCOUNTER — Encounter: Payer: Self-pay | Admitting: Emergency Medicine

## 2020-02-14 ENCOUNTER — Other Ambulatory Visit: Payer: Self-pay

## 2020-02-14 VITALS — BP 124/68 | HR 73 | Temp 97.4°F | Resp 16 | Ht 60.0 in | Wt 125.0 lb

## 2020-02-14 DIAGNOSIS — F418 Other specified anxiety disorders: Secondary | ICD-10-CM | POA: Diagnosis not present

## 2020-02-14 DIAGNOSIS — R21 Rash and other nonspecific skin eruption: Secondary | ICD-10-CM | POA: Diagnosis not present

## 2020-02-14 DIAGNOSIS — F32A Depression, unspecified: Secondary | ICD-10-CM

## 2020-02-14 DIAGNOSIS — F419 Anxiety disorder, unspecified: Secondary | ICD-10-CM | POA: Diagnosis not present

## 2020-02-14 MED ORDER — ALPRAZOLAM 0.5 MG PO TABS: ORAL_TABLET | ORAL | 3 refills | Status: DC

## 2020-02-14 NOTE — Progress Notes (Signed)
Katie Gardner 64 y.o.   Chief Complaint  Patient presents with  . Eye Problem    left with the rash  . Arm Pain    per patient sharpe pain and goes away fast happened 3 days ago  . Medication Refill    Alprazolam    HISTORY OF PRESENT ILLNESS: This is a 64 y.o. female complaining of chronic rash to left upper eyelid for several months. Also complaining of intermittent episodes of short lived sharp pain to left arm that goes away as fast as it comes on. Has history of situational anxiety.  Needs refill on alprazolam. No other complaints or medical concerns today.  HPI   [name]   Medication Sig Start Date End Date Taking? Authorizing Provider  ALPRAZolam (XANAX) 0.5 MG tablet TAKE 1 TABLET(0.5 MG) BY MOUTH DAILY AS NEEDED FOR ANXIETY 02/14/20  Yes Jaekwon Mcclune, Eilleen Kempf, MD  cyclobenzaprine (FLEXERIL) 5 MG tablet Take 1 tablet (5 mg total) by mouth 3 (three) times daily as needed for muscle spasms. 10/23/19  Yes Adalena Abdulla, Eilleen Kempf, MD  ibuprofen (ADVIL) 600 MG tablet TAKE 1 TABLET(600 MG) BY MOUTH DAILY AS NEEDED FOR MODERATE PAIN 01/06/20  Yes Jovonte Commins, Eilleen Kempf, MD  lisinopril (PRINIVIL,ZESTRIL) 10 MG tablet Take 1 tablet (10 mg total) by mouth daily. 04/22/18  Yes Charlie Seda, Eilleen Kempf, MD  loratadine (CLARITIN) 10 MG tablet Take 1 tablet (10 mg total) by mouth daily. 09/12/18  Yes Atiba Kimberlin, Eilleen Kempf, MD  rosuvastatin (CRESTOR) 10 MG tablet TAKE 1 TABLET(10 MG) BY MOUTH DAILY 09/01/19  Yes Georgina Quint, MD  traZODone (DESYREL) 50 MG tablet TAKE 1 TABLET(50 MG) BY MOUTH AT BEDTIME AS NEEDED FOR SLEEP 11/20/19  Yes Ashton Sabine, Eilleen Kempf, MD  conjugated estrogens (PREMARIN) vaginal cream Place 1 Applicatorful vaginally daily. Patient not taking: Reported on 02/14/2020 07/19/19   Georgina Quint, MD  FLUoxetine (PROZAC) 20 MG capsule Take 1 capsule (20 mg total) by mouth daily. 10/23/19 01/21/20  Georgina Quint, MD    Allergies  Allergen  Reactions  . Hydrocodone Nausea And Vomiting    dizzy  . Mucinex Dm [Dm-Guaifenesin Er] Swelling    Sob, throat closing    Patient Active Problem List   Diagnosis Date Noted  . Chronic musculoskeletal pain 09/13/2019  . 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 Dyspareunia in female 09/13/2019  . Skin tags, multiple acquired 07/27/2019  . Chronic insomnia 05/31/2019  . Essential hypertension 12/17/2017  . Seasonal allergies 12/17/2017  . Anxiety and depression 11/13/2016  . Chronic anxiety 11/13/2016  . Chronic sore throat 11/13/2016  . Hot flashes   . Situational anxiety   . Dyslipidemia     Past Medical History:  Diagnosis Date  . Allergy    seasonal  . Anxiety   . Depression   . Hematuria   . Hot flashes   . Hx of cardiovascular stress test    Lex MV 12/13:  EF 68%, mild apical thinning, no ischemia  . Hyperlipidemia     Past Surgical History:  Procedure Laterality Date  . BREAST LUMPECTOMY     right breast, not cancer  . Carpel tunnel    . ECTOPIC PREGNANCY SURGERY      Social History   Socioeconomic History  . Marital status: Legally Separated    Spouse name: Not on file  . Number of children: 1  . Years of education: 8th grade  . Highest education level: Not on file  Occupational History  Employer: UNEMPLOYED  Tobacco Use  . Smoking status: Former Games developer  . Smokeless tobacco: Never Used  Vaping Use  . Vaping Use: Never used  Substance and Sexual Activity  . Alcohol use: No  . Drug use: No  . Sexual activity: Yes  Other Topics Concern  . Not on file  Social History Narrative  . Not on file   Social Determinants of Health   Financial Resource Strain:   . Difficulty of Paying Living Expenses: Not on file  Food Insecurity:   . Worried About Programme researcher, broadcasting/film/video in the Last Year: Not on file  . Ran Out of Food in the Last Year: Not on file  Transportation Needs:   . Lack of Transportation (Medical): Not on file  . Lack of  Transportation (Non-Medical): Not on file  Physical Activity:   . Days of Exercise per Week: Not on file  . Minutes of Exercise per Session: Not on file  Stress:   . Feeling of Stress : Not on file  Social Connections:   . Frequency of Communication with Friends and Family: Not on file  . Frequency of Social Gatherings with Friends and Family: Not on file  . Attends Religious Services: Not on file  . Active Member of Clubs or Organizations: Not on file  . Attends Banker Meetings: Not on file  . Marital Status: Not on file  Intimate Partner Violence:   . Fear of Current or Ex-Partner: Not on file  . Emotionally Abused: Not on file  . Physically Abused: Not on file  . Sexually Abused: Not on file    Family History  Problem Relation Age of Onset  . Heart disease Mother   . Diabetes Mother   . Esophageal cancer Father        71's  . Colon cancer Neg Hx      Review of Systems  Constitutional: Negative.  Negative for chills and fever.  HENT: Negative.  Negative for congestion and sore throat.   Respiratory: Negative.  Negative for cough and shortness of breath.   Cardiovascular: Negative.  Negative for chest pain and palpitations.  Gastrointestinal: Negative.  Negative for abdominal pain, diarrhea, nausea and vomiting.  Genitourinary: Negative.  Negative for dysuria and hematuria.  Neurological: Negative.  Negative for dizziness and headaches.  All other systems reviewed and are negative.  Today's Vitals   02/14/20 1509  BP: 124/68  Pulse: 73  Resp: 16  Temp: (!) 97.4 F (36.3 C)  TempSrc: Temporal  SpO2: 98%  Weight: 125 lb (56.7 kg)  Height: 5' (1.524 m)   Body mass index is 24.41 kg/m.   Physical Exam Vitals reviewed.  Constitutional:      Appearance: Normal appearance.  HENT:     Head: Normocephalic.  Eyes:     Extraocular Movements: Extraocular movements intact.     Conjunctiva/sclera: Conjunctivae normal.     Pupils: Pupils are equal,  round, and reactive to light.  Cardiovascular:     Rate and Rhythm: Normal rate.  Pulmonary:     Effort: Pulmonary effort is normal.  Musculoskeletal:     Cervical back: Normal range of motion.  Skin:    General: Skin is warm and dry.     Capillary Refill: Capillary refill takes less than 2 seconds.     Comments: Mild erythematous rash to left upper eyelid  Neurological:     General: No focal deficit present.     Mental Status: She is  alert and oriented to person, place, and time.  Psychiatric:        Mood and Affect: Mood normal.        Behavior: Behavior normal.      ASSESSMENT & PLAN: Breona was seen today for eye problem, arm pain and medication refill.  Diagnoses and all orders for this visit:  Situational anxiety -     ALPRAZolam (XANAX) 0.5 MG tablet; TAKE 1 TABLET(0.5 MG) BY MOUTH DAILY AS NEEDED FOR ANXIETY  Anxiety and depression -     ALPRAZolam (XANAX) 0.5 MG tablet; TAKE 1 TABLET(0.5 MG) BY MOUTH DAILY AS NEEDED FOR ANXIETY  Facial rash -     Ambulatory referral to Dermatology    Patient Instructions       If you have lab work done today you will be contacted with your lab results within the next 2 weeks.  If you have not heard from [place] then please contact us. The fastest way to get your results is to register for My Chart.   IF you received an x-ray today, you will receive an invoice from [facility]. Please contact [facility] at [phone] with questions or concerns regarding your invoice.   IF you received labwork today, you will receive an invoice from [facility]. Please contact [facility] at [phone] with questions or concerns regarding your invoice.   Our billing staff will not be able to assist you with questions regarding bills from these companies.  You will be contacted with the lab results as soon as they are available. The fastest way to get your results is to activate your My Chart account. Instructions are located on  the last page of this paperwork. If you have not heard from [place] regarding the results in 2 weeks, please contact this office.     Health Maintenance, Female Adopting a healthy lifestyle and getting preventive care are important in promoting health and wellness. Ask your health care provider about:  The right schedule for you to have regular tests and exams.  Things you can do on your own to prevent diseases and keep yourself healthy. What should I know about diet, weight, and exercise? Eat a healthy diet   Eat a diet that includes plenty of vegetables, fruits, low-fat dairy products, and lean protein.  Do not eat a lot of foods that are high in solid fats, added sugars, or sodium. Maintain a healthy weight Body mass index (BMI) is used to identify weight problems. It estimates body fat based on height and weight. Your health care provider can help determine your BMI and help you achieve or maintain a healthy weight. Get regular exercise Get regular exercise. This is one of the most important things you can do for your health. Most adults should:  Exercise for at least 150 minutes each week. The exercise should increase your heart rate and make you sweat (moderate-intensity exercise).  Do strengthening exercises at least twice a week. This is in addition to the moderate-intensity exercise.  Spend less time sitting. Even light physical activity can be beneficial. Watch cholesterol and blood lipids Have your blood tested for lipids and cholesterol at 64 years of age, then have this test every 5 years. Have your cholesterol levels checked more often if:  Your lipid or cholesterol levels are high.  You are older than 64 years of age.  You are at high risk for heart disease. What should I know about cancer screening? Depending on your health history and family history,  you may need to have cancer screening at various ages. This may include screening for:  Breast cancer.  Cervical  cancer.  Colorectal cancer.  Skin cancer.  Lung cancer. What should I know about heart disease, diabetes, and high blood pressure? Blood pressure and heart disease  High blood pressure causes heart disease and increases the risk of stroke. This is more likely to develop in people who have high blood pressure readings, are of African descent, or are overweight.  Have your blood pressure checked: ? Every 3-5 years if you are 6218-64 years of age. ? Every year if you are 64 years old or older. Diabetes Have regular diabetes screenings. This checks your fasting blood sugar level. Have the screening done:  Once every three years after age 64 if you are at a normal weight and have a low risk for diabetes.  More often and at a younger age if you are overweight or have a high risk for diabetes. What should I know about preventing infection? Hepatitis B If you have a higher risk for hepatitis B, you should be screened for this virus. Talk with your health care provider to find out if you are at risk for hepatitis B infection. Hepatitis C Testing is recommended for:  Everyone born from [birth year] through 1965.  Anyone with known risk factors for hepatitis C. Sexually transmitted infections (STIs)  Get screened for STIs, including gonorrhea and chlamydia, if: ? You are sexually active and are younger than 64 years of age. ? You are older than 64 years of age and your health care provider tells you that you are at risk for this type of infection. ? Your sexual activity has changed since you were last screened, and you are at increased risk for chlamydia or gonorrhea. Ask your health care provider if you are at risk.  Ask your health care provider about whether you are at high risk for HIV. Your health care provider may recommend a prescription medicine to help prevent HIV infection. If you choose to take medicine to prevent HIV, you should first get tested for HIV. You should then be tested every 3  months for as long as you are taking the medicine. Pregnancy  If you are about to stop having your period (premenopausal) and you may become pregnant, seek counseling before you get pregnant.  Take 400 to 800 micrograms (mcg) of folic acid every day if you become pregnant.  Ask for birth control (contraception) if you want to prevent pregnancy. Osteoporosis and menopause Osteoporosis is a disease in which the bones lose minerals and strength with aging. This can result in bone fractures. If you are 64 years old or older, or if you are at risk for osteoporosis and fractures, ask your health care provider if you should:  Be screened for bone loss.  Take a calcium or vitamin D supplement to lower your risk of fractures.  Be given hormone replacement therapy (HRT) to treat symptoms of menopause. Follow these instructions at home: Lifestyle  Do not use any products that contain nicotine or tobacco, such as cigarettes, e-cigarettes, and chewing tobacco. If you need help quitting, ask your health care provider.  Do not use street drugs.  Do not share needles.  Ask your health care provider for help if you need support or information about quitting drugs. Alcohol use  Do not drink alcohol if: ? Your health care provider tells you not to drink. ? You are pregnant, may be pregnant, or are  planning to become pregnant.  If you drink alcohol: ? Limit how much you use to 0-1 drink a day. ? Limit intake if you are breastfeeding.  Be aware of how much alcohol is in your drink. In the [place] one drink equals one 12 oz bottle of beer (355 mL), one 5 oz glass of wine (148 mL), or one 1 oz glass of hard liquor (44 mL). General instructions  Schedule regular health, dental, and eye exams.  Stay current with your vaccines.  Tell your health care provider if: ? You often feel depressed. ? You have ever been abused or do not feel safe at home. Summary  Adopting a healthy lifestyle and getting  preventive care are important in promoting health and wellness.  Follow your health care provider's instructions about healthy diet, exercising, and getting tested or screened for diseases.  Follow your health care provider's instructions on monitoring your cholesterol and blood pressure. This information is not intended to replace advice given to you by your health care provider. Make sure you discuss any questions you have with your health care provider. Document Revised: 02/16/2018 Document Reviewed: 02/16/2018 Elsevier Patient Education  2020 Elsevier Inc.      Edwina Barth, MD [facility]

## 2020-02-14 NOTE — Patient Instructions (Addendum)
   If you have lab work done today you will be contacted with your lab results within the next 2 weeks.  If you have not heard from us then please contact us. The fastest way to get your results is to register for My Chart.   IF you received an x-ray today, you will receive an invoice from [facility]. Please contact [facility] at [phone] with questions or concerns regarding your invoice.   IF you received labwork today, you will receive an invoice from [facility]. Please contact [facility] at [phone] with questions or concerns regarding your invoice.   Our billing staff will not be able to assist you with questions regarding bills from these companies.  You will be contacted with the lab results as soon as they are available. The fastest way to get your results is to activate your My Chart account. Instructions are located on the last page of this paperwork. If you have not heard from us regarding the results in 2 weeks, please contact this office.       Health Maintenance, Female Adopting a healthy lifestyle and getting preventive care are important in promoting health and wellness. Ask your health care provider about:  The right schedule for you to have regular tests and exams.  Things you can do on your own to prevent diseases and keep yourself healthy. What should I know about diet, weight, and exercise? Eat a healthy diet   Eat a diet that includes plenty of vegetables, fruits, low-fat dairy products, and lean protein.  Do not eat a lot of foods that are high in solid fats, added sugars, or sodium. Maintain a healthy weight Body mass index (BMI) is used to identify weight problems. It estimates body fat based on height and weight. Your health care provider can help determine your BMI and help you achieve or maintain a healthy weight. Get regular exercise Get regular exercise. This is one of the most important things you can do for your health. Most  adults should:  Exercise for at least 150 minutes each week. The exercise should increase your heart rate and make you sweat (moderate-intensity exercise).  Do strengthening exercises at least twice a week. This is in addition to the moderate-intensity exercise.  Spend less time sitting. Even light physical activity can be beneficial. Watch cholesterol and blood lipids Have your blood tested for lipids and cholesterol at 64 years of age, then have this test every 5 years. Have your cholesterol levels checked more often if:  Your lipid or cholesterol levels are high.  You are older than 64 years of age.  You are at high risk for heart disease. What should I know about cancer screening? Depending on your health history and family history, you may need to have cancer screening at various ages. This may include screening for:  Breast cancer.  Cervical cancer.  Colorectal cancer.  Skin cancer.  Lung cancer. What should I know about heart disease, diabetes, and high blood pressure? Blood pressure and heart disease  High blood pressure causes heart disease and increases the risk of stroke. This is more likely to develop in people who have high blood pressure readings, are of African descent, or are overweight.  Have your blood pressure checked: ? Every 3-5 years if you are 18-39 years of age. ? Every year if you are 40 years old or older. Diabetes Have regular diabetes screenings. This checks your fasting blood sugar level. Have the screening done:  Once   every three years after age 40 if you are at a normal weight and have a low risk for diabetes.  More often and at a younger age if you are overweight or have a high risk for diabetes. What should I know about preventing infection? Hepatitis B If you have a higher risk for hepatitis B, you should be screened for this virus. Talk with your health care provider to find out if you are at risk for hepatitis B infection. Hepatitis  C Testing is recommended for:  Everyone born from 1945 through 1965.  Anyone with known risk factors for hepatitis C. Sexually transmitted infections (STIs)  Get screened for STIs, including gonorrhea and chlamydia, if: ? You are sexually active and are younger than 64 years of age. ? You are older than 64 years of age and your health care provider tells you that you are at risk for this type of infection. ? Your sexual activity has changed since you were last screened, and you are at increased risk for chlamydia or gonorrhea. Ask your health care provider if you are at risk.  Ask your health care provider about whether you are at high risk for HIV. Your health care provider may recommend a prescription medicine to help prevent HIV infection. If you choose to take medicine to prevent HIV, you should first get tested for HIV. You should then be tested every 3 months for as long as you are taking the medicine. Pregnancy  If you are about to stop having your period (premenopausal) and you may become pregnant, seek counseling before you get pregnant.  Take 400 to 800 micrograms (mcg) of folic acid every day if you become pregnant.  Ask for birth control (contraception) if you want to prevent pregnancy. Osteoporosis and menopause Osteoporosis is a disease in which the bones lose minerals and strength with aging. This can result in bone fractures. If you are 65 years old or older, or if you are at risk for osteoporosis and fractures, ask your health care provider if you should:  Be screened for bone loss.  Take a calcium or vitamin D supplement to lower your risk of fractures.  Be given hormone replacement therapy (HRT) to treat symptoms of menopause. Follow these instructions at home: Lifestyle  Do not use any products that contain nicotine or tobacco, such as cigarettes, e-cigarettes, and chewing tobacco. If you need help quitting, ask your health care provider.  Do not use street  drugs.  Do not share needles.  Ask your health care provider for help if you need support or information about quitting drugs. Alcohol use  Do not drink alcohol if: ? Your health care provider tells you not to drink. ? You are pregnant, may be pregnant, or are planning to become pregnant.  If you drink alcohol: ? Limit how much you use to 0-1 drink a day. ? Limit intake if you are breastfeeding.  Be aware of how much alcohol is in your drink. In the [place] one drink equals one 12 oz bottle of beer (355 mL), one 5 oz glass of wine (148 mL), or one 1 oz glass of hard liquor (44 mL). General instructions  Schedule regular health, dental, and eye exams.  Stay current with your vaccines.  Tell your health care provider if: ? You often feel depressed. ? You have ever been abused or do not feel safe at home. Summary  Adopting a healthy lifestyle and getting preventive care are important in promoting health and   wellness.  Follow your health care provider's instructions about healthy diet, exercising, and getting tested or screened for diseases.  Follow your health care provider's instructions on monitoring your cholesterol and blood pressure. This information is not intended to replace advice given to you by your health care provider. Make sure you discuss any questions you have with your health care provider. Document Revised: 02/16/2018 Document Reviewed: 02/16/2018 Elsevier Patient Education  2020 Elsevier Inc.  

## 2020-02-20 ENCOUNTER — Other Ambulatory Visit: Payer: Self-pay | Admitting: Emergency Medicine

## 2020-02-20 DIAGNOSIS — F5104 Psychophysiologic insomnia: Secondary | ICD-10-CM

## 2020-02-28 ENCOUNTER — Telehealth: Payer: Self-pay | Admitting: Emergency Medicine

## 2020-02-28 NOTE — Telephone Encounter (Signed)
Patient needs another referral for a different dermatologist. Office she was referred to doesn't accept her insurance ([employer] and [place] Patient requesting new referral asap. Please advise at [phone]

## 2020-02-28 NOTE — Telephone Encounter (Signed)
Pt needs referral sent some where that will take her insurance

## 2020-03-03 ENCOUNTER — Other Ambulatory Visit: Payer: Self-pay | Admitting: Emergency Medicine

## 2020-03-03 DIAGNOSIS — E785 Hyperlipidemia, unspecified: Secondary | ICD-10-CM

## 2020-03-03 NOTE — Telephone Encounter (Signed)
Requested Prescriptions  Pending Prescriptions Disp Refills  . rosuvastatin (CRESTOR) 10 MG tablet [Pharmacy Med Name: ROSUVASTATIN 10MG  TABLETS] 90 tablet 1    Sig: TAKE 1 TABLET(10 MG) BY MOUTH DAILY     Cardiovascular:  Antilipid - Statins Failed - 03/03/2020  3:43 AM      Failed - LDL in normal range and within 360 days    LDL Chol Calc (NIH)  Date Value Ref Range Status  09/13/2019 111 (H) 0 - 99 mg/dL Final         Passed - Total Cholesterol in normal range and within 360 days    Cholesterol, Total  Date Value Ref Range Status  09/13/2019 196 100 - 199 mg/dL Final         Passed - HDL in normal range and within 360 days    HDL  Date Value Ref Range Status  09/13/2019 69 >39 mg/dL Final         Passed - Triglycerides in normal range and within 360 days    Triglycerides  Date Value Ref Range Status  09/13/2019 89 0 - 149 mg/dL Final         Passed - Patient is not pregnant      Passed - Valid encounter within last 12 months    Recent Outpatient Visits          2 weeks ago Situational anxiety   [facility], [place], [place]   3 months ago Eczematous dermatitis of left upper eyelid   [facility] at [facility], [facility], MD   4 months ago Anxiety and depression   [facility] at [place], [place], [place]   5 months ago Dyspareunia in female   [facility] at [place], [place], [place]   7 months ago Dyspareunia in female   Primary Care at [facility], [facility], MD      Future Appointments            In 1 month Sagardia, Eilleen Kempf, MD [facility] at [place], [facility]

## 2020-03-05 ENCOUNTER — Ambulatory Visit (INDEPENDENT_AMBULATORY_CARE_PROVIDER_SITE_OTHER): Payer: 59 | Admitting: Registered Nurse

## 2020-03-05 ENCOUNTER — Other Ambulatory Visit: Payer: Self-pay

## 2020-03-05 ENCOUNTER — Encounter: Payer: Self-pay | Admitting: Registered Nurse

## 2020-03-05 VITALS — BP 130/78 | HR 85 | Temp 98.0°F | Resp 18 | Ht 60.0 in | Wt 126.0 lb

## 2020-03-05 DIAGNOSIS — M545 Low back pain, unspecified: Secondary | ICD-10-CM | POA: Diagnosis not present

## 2020-03-05 DIAGNOSIS — R0989 Other specified symptoms and signs involving the circulatory and respiratory systems: Secondary | ICD-10-CM

## 2020-03-05 LAB — POCT URINALYSIS DIP (CLINITEK)
Bilirubin, UA: NEGATIVE
Blood, UA: NEGATIVE
Glucose, UA: NEGATIVE mg/dL
Ketones, POC UA: NEGATIVE mg/dL
Leukocytes, UA: NEGATIVE
Nitrite, UA: NEGATIVE
POC PROTEIN,UA: NEGATIVE
Spec Grav, UA: >=1.03 — AB (ref 1.010–1.025)
Urobilinogen, UA: 0.2 E.U./dL
pH, UA: 7 (ref 5.0–8.0)

## 2020-03-05 LAB — BASIC METABOLIC PANEL
BUN/Creatinine Ratio: 16 (ref 12–28)
BUN: 12 mg/dL (ref 8–27)
CO2: 25 mmol/L (ref 20–29)
Calcium: 9 mg/dL (ref 8.7–10.3)
Chloride: 103 mmol/L (ref 96–106)
Creatinine, Ser: 0.73 mg/dL (ref 0.57–1.00)
GFR calc Af Amer: 101 mL/min/{1.73_m2} (ref >59)
GFR calc non Af Amer: 87 mL/min/{1.73_m2} (ref >59)
Glucose: 99 mg/dL (ref 65–99)
Potassium: 4.6 mmol/L (ref 3.5–5.2)
Sodium: 140 mmol/L (ref 134–144)

## 2020-03-05 LAB — CBC
Hematocrit: 41.1 % (ref 34.0–46.6)
Hemoglobin: 14 g/dL (ref 11.1–15.9)
MCH: 29.7 pg (ref 26.6–33.0)
MCHC: 34.1 g/dL (ref 31.5–35.7)
MCV: 87 fL (ref 79–97)
Platelets: 162 10*3/uL (ref 150–450)
RBC: 4.71 x10E6/uL (ref 3.77–5.28)
RDW: 12 % (ref 11.7–15.4)
WBC: 2.9 10*3/uL — ABNORMAL LOW (ref 3.4–10.8)

## 2020-03-05 NOTE — Patient Instructions (Signed)
° ° ° °  If you have lab work done today you will be contacted with your lab results within the next 2 weeks.  If you have not heard from us then please contact us. The fastest way to get your results is to register for My Chart. ° ° °IF you received an x-ray today, you will receive an invoice from [facility]. Please contact [facility] at [phone] with questions or concerns regarding your invoice.  ° °IF you received labwork today, you will receive an invoice from [facility]. Please contact [facility] at [phone] with questions or concerns regarding your invoice.  ° °Our billing staff will not be able to assist you with questions regarding bills from these companies. ° °You will be contacted with the lab results as soon as they are available. The fastest way to get your results is to activate your My Chart account. Instructions are located on the last page of this paperwork. If you have not heard from us regarding the results in 2 weeks, please contact this office. °  ° ° ° °

## 2020-03-05 NOTE — Progress Notes (Signed)
Acute Office Visit  Subjective:    Patient ID: Katie Gardner, female    DOB: Nov 19, 1955, 64 y.o.   MRN: [id number]  Chief Complaint  Patient presents with  . Back Pain    Patient states she has been having some back pain and rib pain since christmas eve. Per patient she feels like she had a cold and that might have happened because she took a shower and went straight outside.    HPI Patient is in today for back pain and some respiratory symptoms  Onset on Dec 25. Had been at a friend's house the day before, had been around others previously but none to her knowledge are sick Notes she has not rec'd COVID-19 vaccine Lower back pain, tightness. Has not taken Flexeril though she does have some at home Has been taking ibuprofen past 2 days with good relief No distinct urinary symptoms that she notes but wants to rule out UTI Did have fever below 100 that has since reduced. Upper respiratory symptoms have resolved  No other concerns.   Past Medical History:  Diagnosis Date  . Allergy    seasonal  . Anxiety   . Depression   . Hematuria   . Hot flashes   . Hx of cardiovascular stress test    Lex MV 12/13:  EF 68%, mild apical thinning, no ischemia  . Hyperlipidemia     Past Surgical History:  Procedure Laterality Date  . BREAST LUMPECTOMY     right breast, not cancer  . Carpel tunnel    . ECTOPIC PREGNANCY SURGERY      Family History  Problem Relation Age of Onset  . Heart disease Mother   . Diabetes Mother   . Esophageal cancer Father        27's  . Colon cancer Neg Hx     Social History   Socioeconomic History  . Marital status: Legally Separated    Spouse name: Not on file  . Number of children: 1  . Years of education: 8th grade  . Highest education level: Not on file  Occupational History    Employer: UNEMPLOYED  Tobacco Use  . Smoking status: Former Games developer  . Smokeless tobacco: Never Used  Vaping Use  . Vaping Use: Never used  Substance and Sexual  Activity  . Alcohol use: No  . Drug use: No  . Sexual activity: Yes  Other Topics Concern  . Not on file  Social History Narrative  . Not on file   Social Determinants of Health   Financial Resource Strain: Not on file  Food Insecurity: Not on file  Transportation Needs: Not on file  Physical Activity: Not on file  Stress: Not on file  Social Connections: Not on file  Intimate Partner Violence: Not on file    Outpatient Medications Prior to Visit  Medication Sig Dispense Refill  . ALPRAZolam (XANAX) 0.5 MG tablet TAKE 1 TABLET(0.5 MG) BY MOUTH DAILY AS NEEDED FOR ANXIETY 30 tablet 3  . cyclobenzaprine (FLEXERIL) 5 MG tablet Take 1 tablet (5 mg total) by mouth 3 (three) times daily as needed for muscle spasms. 30 tablet 1  . ibuprofen (ADVIL) 600 MG tablet TAKE 1 TABLET(600 MG) BY MOUTH DAILY AS NEEDED FOR MODERATE PAIN 90 tablet 0  . lisinopril (PRINIVIL,ZESTRIL) 10 MG tablet Take 1 tablet (10 mg total) by mouth daily. 90 tablet 3  . loratadine (CLARITIN) 10 MG tablet Take 1 tablet (10 mg total) by mouth daily. 30 tablet  11  . rosuvastatin (CRESTOR) 10 MG tablet TAKE 1 TABLET(10 MG) BY MOUTH DAILY 90 tablet 1  . traZODone (DESYREL) 50 MG tablet TAKE 1 TABLET(50 MG) BY MOUTH AT BEDTIME AS NEEDED FOR SLEEP 90 tablet 0  . conjugated estrogens (PREMARIN) vaginal cream Place 1 Applicatorful vaginally daily. (Patient not taking: No sig reported) 42.5 g 12  . FLUoxetine (PROZAC) 20 MG capsule Take 1 capsule (20 mg total) by mouth daily. 90 capsule 1   No facility-administered medications prior to visit.    Allergies  Allergen Reactions  . Hydrocodone Nausea And Vomiting    dizzy  . Mucinex Dm [Dm-Guaifenesin Er] Swelling    Sob, throat closing    Review of Systems Per hpi      Objective:    Physical Exam Vitals and nursing note reviewed.  Constitutional:      General: She is not in acute distress.    Appearance: Normal appearance. She is not ill-appearing,  toxic-appearing or diaphoretic.  Cardiovascular:     Rate and Rhythm: Normal rate and regular rhythm.  Pulmonary:     Effort: Pulmonary effort is normal. No respiratory distress.  Skin:    General: Skin is warm and dry.  Neurological:     General: No focal deficit present.     Mental Status: She is alert and oriented to person, place, and time.  Psychiatric:        Mood and Affect: Mood normal.        Behavior: Behavior normal.        Thought Content: Thought content normal.        Judgment: Judgment normal.     BP 130/78   Pulse 85   Temp 98 F (36.7 C) (Temporal)   Resp 18   Ht 5' (1.524 m)   Wt 126 lb (57.2 kg)   SpO2 100%   BMI 24.61 kg/m  Wt Readings from Last 3 Encounters:  03/05/20 126 lb (57.2 kg)  02/14/20 125 lb (56.7 kg)  11/21/19 124 lb 6.4 oz (56.4 kg)    There are no preventive care reminders to display for this patient.  There are no preventive care reminders to display for this patient.   Lab Results  Component Value Date   TSH 1.700 06/13/2018   Lab Results  Component Value Date   WBC 4.9 09/12/2018   HGB 13.0 09/12/2018   HCT 39.2 09/12/2018   MCV 88 09/12/2018   PLT 190 09/12/2018   Lab Results  Component Value Date   NA 143 09/13/2019   K 4.7 09/13/2019   CO2 27 09/13/2019   GLUCOSE 93 09/13/2019   BUN 17 09/13/2019   CREATININE 0.78 09/13/2019   BILITOT 0.3 09/13/2019   ALKPHOS 106 09/13/2019   AST 19 09/13/2019   ALT 14 09/13/2019   PROT 6.5 09/13/2019   ALBUMIN 4.2 09/13/2019   CALCIUM 9.4 09/13/2019   Lab Results  Component Value Date   CHOL 196 09/13/2019   Lab Results  Component Value Date   HDL 69 09/13/2019   Lab Results  Component Value Date   LDLCALC 111 (H) 09/13/2019   Lab Results  Component Value Date   TRIG 89 09/13/2019   Lab Results  Component Value Date   CHOLHDL 2.8 09/13/2019   Lab Results  Component Value Date   HGBA1C 5.8 (H) 09/13/2019       Assessment & Plan:   Problem List Items  Addressed This Visit   None  Visit Diagnoses    Symptoms of upper respiratory infection (URI)    -  Primary   Relevant Orders   COVID-19, Flu A+B and RSV   Acute bilateral low back pain without sciatica       Relevant Orders   POCT URINALYSIS DIP (CLINITEK)   Urine Culture   CBC   Basic Metabolic Panel       No orders of the defined types were placed in this encounter.  PLAN  Suspect COVID-19 infection - mild  As such, low contact exam - will collect testing and follow up as warranted  Suggest flexeril and around the clock tylenol  Return prn  Isolate until results return  POCT UA shows no evidence of UTI but elevated SG likely suggestive of dehydration. Will send culture to ensure no UTI.  Stay at home note for yesterday, today, and tomorrow given  Patient encouraged to call clinic with any questions, comments, or concerns.   Janeece Agee, NP

## 2020-03-06 ENCOUNTER — Telehealth: Payer: Self-pay | Admitting: Emergency Medicine

## 2020-03-06 NOTE — Telephone Encounter (Signed)
COVID Swab was done 03/05/2020. Results can take up to a week to return. Other lab test results are back awaiting provider to review results. We will contact the pt once results are back and provider  has review results.

## 2020-03-07 LAB — COVID-19, FLU A+B AND RSV
Influenza A, NAA: NOT DETECTED
Influenza B, NAA: NOT DETECTED
RSV, NAA: NOT DETECTED
SARS-CoV-2, NAA: DETECTED — AB

## 2020-03-09 LAB — URINE CULTURE

## 2020-03-11 ENCOUNTER — Other Ambulatory Visit: Payer: Self-pay | Admitting: Registered Nurse

## 2020-03-11 ENCOUNTER — Telehealth: Payer: Self-pay | Admitting: Emergency Medicine

## 2020-03-11 DIAGNOSIS — N3000 Acute cystitis without hematuria: Secondary | ICD-10-CM

## 2020-03-11 MED ORDER — NITROFURANTOIN MONOHYD MACRO 100 MG PO CAPS: 100.0000 mg | ORAL_CAPSULE | Freq: Two times a day (BID) | ORAL | 0 refills | Status: AC

## 2020-03-11 NOTE — Telephone Encounter (Signed)
Pt will need virtual for provider to decide

## 2020-03-11 NOTE — Telephone Encounter (Signed)
Pt has covid. She is having a very tough time. She is very tired, lots of coughing and a headache. She would like to know if we could offer her an infusion treatment of antibodies. Please advise pt at [phone].

## 2020-03-12 ENCOUNTER — Telehealth: Payer: Self-pay | Admitting: Emergency Medicine

## 2020-03-12 ENCOUNTER — Encounter: Payer: Self-pay | Admitting: Registered Nurse

## 2020-03-12 ENCOUNTER — Other Ambulatory Visit: Payer: Self-pay | Admitting: Registered Nurse

## 2020-03-12 DIAGNOSIS — N3000 Acute cystitis without hematuria: Secondary | ICD-10-CM

## 2020-03-12 MED ORDER — SULFAMETHOXAZOLE-TRIMETHOPRIM 800-160 MG PO TABS: 1.0000 | ORAL_TABLET | Freq: Two times a day (BID) | ORAL | 0 refills | Status: DC

## 2020-03-12 NOTE — Telephone Encounter (Signed)
I called the pt and relayed the message.

## 2020-03-12 NOTE — Telephone Encounter (Signed)
I called the patient mobile number and left voice message to call back. Also, I spoke to Eugene Gavia NP about the patient stating Macrobid making her sick and vomiting. He prescribed Bactrim to take twice a day for 3 days, sent to [facility] pharmacy.

## 2020-03-12 NOTE — Telephone Encounter (Signed)
Pt stated that the Rx for nitrofurantoin, macrocrystal-monohydrate, (MACROBID) 100 MG capsule Made her sick to her stomach and she started vomiting the medication up / Pt asked if another antibiotic can be sent that is not as harsh on her stomach / or an antibiotic treatment/ please advise

## 2020-03-12 NOTE — Telephone Encounter (Signed)
Unfortunately we're too far past her exposure and infusions are in extremely limited supply - the likelihood that she would see any benefit from this is extremely limited.  Thanks,  Luan Pulling

## 2020-03-14 ENCOUNTER — Telehealth: Payer: Self-pay | Admitting: Emergency Medicine

## 2020-03-14 NOTE — Telephone Encounter (Signed)
Pt wants to see a different dermatologist one in  due to transportation. Please advise. Let us know if the pt will need a new referral for this request.

## 2020-04-06 ENCOUNTER — Other Ambulatory Visit: Payer: Self-pay | Admitting: Emergency Medicine

## 2020-04-06 DIAGNOSIS — M25519 Pain in unspecified shoulder: Secondary | ICD-10-CM

## 2020-04-06 NOTE — Telephone Encounter (Signed)
Requested Prescriptions  Pending Prescriptions Disp Refills  . ibuprofen (ADVIL) 600 MG tablet [Pharmacy Med Name: IBUPROFEN 600MG  TABLETS] 90 tablet 0    Sig: TAKE 1 TABLET(600 MG) BY MOUTH DAILY AS NEEDED FOR MODERATE PAIN     Analgesics:  NSAIDS Passed - 04/06/2020  3:42 AM      Passed - Cr in normal range and within 360 days    Creatinine, Ser  Date Value Ref Range Status  03/05/2020 0.73 0.57 - 1.00 mg/dL Final   Creatinine,U  Date Value Ref Range Status  02/08/2010  mg/dL Final   14/05/2009 (NOTE)  Cutoff Values for Urine Drug Screen:        Drug Class           Cutoff (ng/mL)        Amphetamines            1000        Barbiturates             200        Cocaine Metabolites      300        Benzodiazepines          200        Methadone                 300        Opiates                 2000        Phencyclidine             25        Propoxyphene             300        Marijuana Metabolites     50  For medical purposes only.         Passed - HGB in normal range and within 360 days    Hemoglobin  Date Value Ref Range Status  03/05/2020 14.0 11.1 - 15.9 g/dL Final         Passed - Patient is not pregnant      Passed - Valid encounter within last 12 months    Recent Outpatient Visits          1 month ago Symptoms of upper respiratory infection (URI)   Primary Care at 03/07/2020, Shelbie Ammons, NP   1 month ago Situational anxiety   Primary Care at [place], [place], MD   4 months ago Eczematous dermatitis of left upper eyelid   Primary Care at [facility], [facility], MD   5 months ago Anxiety and depression   Primary Care at [place], [place], MD   6 months ago Dyspareunia in female   Primary Care at [facility], [facility], MD      Future Appointments            In 2 weeks Sagardia, Eilleen Kempf, MD Primary Care at [place], [facility]

## 2020-04-19 ENCOUNTER — Other Ambulatory Visit: Payer: Self-pay | Admitting: Emergency Medicine

## 2020-04-19 DIAGNOSIS — F32A Depression, unspecified: Secondary | ICD-10-CM

## 2020-04-19 DIAGNOSIS — F419 Anxiety disorder, unspecified: Secondary | ICD-10-CM

## 2020-04-22 ENCOUNTER — Ambulatory Visit: Payer: 59 | Admitting: Emergency Medicine

## 2020-04-24 ENCOUNTER — Ambulatory Visit: Payer: Medicaid Other | Admitting: Emergency Medicine

## 2020-05-18 ENCOUNTER — Other Ambulatory Visit: Payer: Self-pay | Admitting: Emergency Medicine

## 2020-05-18 DIAGNOSIS — F5104 Psychophysiologic insomnia: Secondary | ICD-10-CM

## 2020-05-18 NOTE — Telephone Encounter (Signed)
Requested medications are due for refill today yes  Requested medications are on the active medication list yes  Last refill 12/14  Last visit 09/2019 last OV note to address med/dx  Future visit scheduled No  Notes to clinic Failed protocol due to no valid visit within 6  months, no upcoming appt scheduled.

## 2020-05-20 NOTE — Telephone Encounter (Signed)
Patient is requesting a refill of the following medications: Requested Prescriptions   Pending Prescriptions Disp Refills   traZODone (DESYREL) 50 MG tablet [Pharmacy Med Name: TRAZODONE 50MG  TABLETS] 90 tablet 0    Sig: TAKE 1 TABLET(50 MG) BY MOUTH AT BEDTIME AS NEEDED FOR SLEEP    Date of patient request: 05/20/20 Last office visit: 03/05/20 Date of last refill: 02/20/20 Last refill amount: 90 Follow up time period per chart:

## 2020-06-09 ENCOUNTER — Other Ambulatory Visit: Payer: Self-pay | Admitting: Emergency Medicine

## 2020-06-09 DIAGNOSIS — E785 Hyperlipidemia, unspecified: Secondary | ICD-10-CM

## 2020-07-04 ENCOUNTER — Other Ambulatory Visit: Payer: Self-pay | Admitting: Emergency Medicine

## 2020-07-04 DIAGNOSIS — M25519 Pain in unspecified shoulder: Secondary | ICD-10-CM

## 2020-07-17 ENCOUNTER — Other Ambulatory Visit: Payer: Self-pay

## 2020-07-17 ENCOUNTER — Other Ambulatory Visit: Payer: Self-pay | Admitting: Emergency Medicine

## 2020-07-17 ENCOUNTER — Ambulatory Visit (INDEPENDENT_AMBULATORY_CARE_PROVIDER_SITE_OTHER): Payer: 59 | Admitting: Emergency Medicine

## 2020-07-17 ENCOUNTER — Encounter: Payer: Self-pay | Admitting: Emergency Medicine

## 2020-07-17 VITALS — BP 138/80 | HR 91 | Temp 98.2°F | Wt 128.0 lb

## 2020-07-17 DIAGNOSIS — M79602 Pain in left arm: Secondary | ICD-10-CM | POA: Diagnosis not present

## 2020-07-17 DIAGNOSIS — M7918 Myalgia, other site: Secondary | ICD-10-CM

## 2020-07-17 DIAGNOSIS — G8929 Other chronic pain: Secondary | ICD-10-CM

## 2020-07-17 DIAGNOSIS — E785 Hyperlipidemia, unspecified: Secondary | ICD-10-CM | POA: Diagnosis not present

## 2020-07-17 DIAGNOSIS — F419 Anxiety disorder, unspecified: Secondary | ICD-10-CM

## 2020-07-17 DIAGNOSIS — I10 Essential (primary) hypertension: Secondary | ICD-10-CM

## 2020-07-17 DIAGNOSIS — M79632 Pain in left forearm: Secondary | ICD-10-CM | POA: Insufficient documentation

## 2020-07-17 LAB — CBC WITH DIFFERENTIAL/PLATELET
Basophils Absolute: 0 10*3/uL (ref 0.0–0.1)
Basophils Relative: 0.5 % (ref 0.0–3.0)
Eosinophils Absolute: 0.1 10*3/uL (ref 0.0–0.7)
Eosinophils Relative: 0.7 % (ref 0.0–5.0)
HCT: 39.4 % (ref 36.0–46.0)
Hemoglobin: 13.4 g/dL (ref 12.0–15.0)
Lymphocytes Relative: 25.9 % (ref 12.0–46.0)
Lymphs Abs: 2 10*3/uL (ref 0.7–4.0)
MCHC: 33.9 g/dL (ref 30.0–36.0)
MCV: 87 fl (ref 78.0–100.0)
Monocytes Absolute: 0.5 10*3/uL (ref 0.1–1.0)
Monocytes Relative: 6.7 % (ref 3.0–12.0)
Neutro Abs: 5.2 10*3/uL (ref 1.4–7.7)
Neutrophils Relative %: 66.2 % (ref 43.0–77.0)
Platelets: 211 10*3/uL (ref 150.0–400.0)
RBC: 4.53 Mil/uL (ref 3.87–5.11)
RDW: 12.7 % (ref 11.5–15.5)
WBC: 7.9 10*3/uL (ref 4.0–10.5)

## 2020-07-17 LAB — COMPREHENSIVE METABOLIC PANEL
ALT: 17 U/L (ref 0–35)
AST: 18 U/L (ref 0–37)
Albumin: 4.2 g/dL (ref 3.5–5.2)
Alkaline Phosphatase: 63 U/L (ref 39–117)
BUN: 18 mg/dL (ref 6–23)
CO2: 31 mEq/L (ref 19–32)
Calcium: 9.4 mg/dL (ref 8.4–10.5)
Chloride: 102 mEq/L (ref 96–112)
Creatinine, Ser: 0.73 mg/dL (ref 0.40–1.20)
GFR: 86.78 mL/min (ref >60.00)
Glucose, Bld: 108 mg/dL — ABNORMAL HIGH (ref 70–99)
Potassium: 4.2 mEq/L (ref 3.5–5.1)
Sodium: 137 mEq/L (ref 135–145)
Total Bilirubin: 0.4 mg/dL (ref 0.2–1.2)
Total Protein: 6.9 g/dL (ref 6.0–8.3)

## 2020-07-17 LAB — LIPID PANEL
Cholesterol: 232 mg/dL — ABNORMAL HIGH (ref 0–200)
HDL: 72.6 mg/dL (ref >39.00)
LDL Cholesterol: 128 mg/dL — ABNORMAL HIGH (ref 0–99)
NonHDL: 159.09
Total CHOL/HDL Ratio: 3
Triglycerides: 155 mg/dL — ABNORMAL HIGH (ref 0.0–149.0)
VLDL: 31 mg/dL (ref 0.0–40.0)

## 2020-07-17 LAB — HEMOGLOBIN A1C: Hgb A1c MFr Bld: 5.9 % (ref 4.6–6.5)

## 2020-07-17 LAB — VITAMIN D 25 HYDROXY (VIT D DEFICIENCY, FRACTURES): VITD: 47.77 ng/mL (ref 30.00–100.00)

## 2020-07-17 LAB — TSH: TSH: 1.07 u[IU]/mL (ref 0.35–4.50)

## 2020-07-17 NOTE — Assessment & Plan Note (Signed)
Musculoskeletal pain, now gone.

## 2020-07-17 NOTE — Assessment & Plan Note (Signed)
Diet and nutrition discussed.  We will do fasting lipid profile today.  Continue rosuvastatin 10 mg daily.

## 2020-07-17 NOTE — Progress Notes (Signed)
Katie Gardner 65 y.o.   Chief Complaint  Patient presents with  . Arm Pain    Intermittent; took muscle relaxer & it was helpful.  Noticed x 1 day; left bicep.    HISTORY OF PRESENT ILLNESS: This is a 65 y.o. female complaining of left upper arm pain that started several days ago and is now much better.  Thinks it was muscle pain to her left bicep area.  No other complaints or medical concerns today. #1 history of hypertension, has not taken lisinopril in the past 2 days. #2 dyslipidemia: On rosuvastatin 10 mg daily. Has history of chronic anxiety and takes Xanax as needed and 20 mg of daily Prozac.  HPI   [name]   Medication Sig Start Date End Date Taking? Authorizing Provider  ALPRAZolam (XANAX) 0.5 MG tablet TAKE 1 TABLET(0.5 MG) BY MOUTH DAILY AS NEEDED FOR ANXIETY 02/14/20  Yes Faiga Stones, Eilleen KempfMiguel Jose, MD  cyclobenzaprine (FLEXERIL) 5 MG tablet Take 1 tablet (5 mg total) by mouth 3 (three) times daily as needed for muscle spasms. 10/23/19  Yes Liel Rudden, Eilleen KempfMiguel Jose, MD  FLUoxetine (PROZAC) 20 MG capsule TAKE 1 CAPSULE(20 MG) BY MOUTH DAILY 04/19/20  Yes Erienne Spelman, Eilleen KempfMiguel Jose, MD  ibuprofen (ADVIL) 600 MG tablet Take 1 tablet (600 mg total) by mouth every 8 (eight) hours as needed for moderate pain. 07/04/20  Yes Hovanes Hymas, Eilleen KempfMiguel Jose, MD  lisinopril (PRINIVIL,ZESTRIL) 10 MG tablet Take 1 tablet (10 mg total) by mouth daily. 04/22/18  Yes Sharlie Shreffler, Eilleen KempfMiguel Jose, MD  loratadine (CLARITIN) 10 MG tablet Take 1 tablet (10 mg total) by mouth daily. 09/12/18  Yes Cotton Beckley, Eilleen KempfMiguel Jose, MD  rosuvastatin (CRESTOR) 10 MG tablet TAKE 1 TABLET(10 MG) BY MOUTH DAILY 06/10/20  Yes Georgina QuintSagardia, Mieko Kneebone Jose, MD  traZODone (DESYREL) 50 MG tablet TAKE 1 TABLET(50 MG) BY MOUTH AT BEDTIME AS NEEDED FOR SLEEP 05/20/20  Yes Teoman Giraud, Eilleen KempfMiguel Jose, MD  conjugated estrogens (PREMARIN) vaginal cream Place 1 Applicatorful vaginally daily. Patient not taking: No sig reported 07/19/19   Georgina QuintSagardia, Crew Goren  Jose, MD  PREMPRO 0.45-1.5 MG tablet Take 1 tablet by mouth every morning. 05/15/20   [provider]  sulfamethoxazole-trimethoprim (BACTRIM DS) 800-160 MG tablet Take 1 tablet by mouth 2 (two) times daily. Patient not taking: Reported on 07/17/2020 03/12/20   Janeece AgeeMorrow, Richard, NP    Allergies  Allergen Reactions  . Macrobid [Nitrofurantoin] Nausea And Vomiting  . Hydrocodone Nausea And Vomiting    dizzy  . Mucinex Dm [Dm-Guaifenesin Er] Swelling    Sob, throat closing    Patient Active Problem List   Diagnosis Date Noted  . Chronic musculoskeletal pain 09/13/2019  . 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 Dyspareunia in female 09/13/2019  . Skin tags, multiple acquired 07/27/2019  . Chronic insomnia 05/31/2019  . Essential hypertension 12/17/2017  . Seasonal allergies 12/17/2017  . Anxiety and depression 11/13/2016  . Chronic anxiety 11/13/2016  . Chronic sore throat 11/13/2016  . Hot flashes   . Situational anxiety   . Dyslipidemia     Past Medical History:  Diagnosis Date  . Allergy    seasonal  . Anxiety   . Depression   . Hematuria   . Hot flashes   . Hx of cardiovascular stress test    Lex MV 12/13:  EF 68%, mild apical thinning, no ischemia  . Hyperlipidemia     Past Surgical History:  Procedure Laterality Date  . BREAST LUMPECTOMY     right breast, not  cancer  . Carpel tunnel    . ECTOPIC PREGNANCY SURGERY      Social History   Socioeconomic History  . Marital status: Legally Separated    Spouse name: Not on file  . Number of children: 1  . Years of education: 8th grade  . Highest education level: Not on file  Occupational History    Employer: UNEMPLOYED  Tobacco Use  . Smoking status: Former Games developer  . Smokeless tobacco: Never Used  Vaping Use  . Vaping Use: Never used  Substance and Sexual Activity  . Alcohol use: No  . Drug use: No  . Sexual activity: Yes  Other Topics Concern  . Not on file  Social History Narrative  .  Not on file   Social Determinants of Health   Financial Resource Strain: Not on file  Food Insecurity: Not on file  Transportation Needs: Not on file  Physical Activity: Not on file  Stress: Not on file  Social Connections: Not on file  Intimate Partner Violence: Not on file    Family History  Problem Relation Age of Onset  . Heart disease Mother   . Diabetes Mother   . Esophageal cancer Father        40's  . Colon cancer Neg Hx      Review of Systems  Constitutional: Negative.  Negative for chills and fever.  HENT: Negative.  Negative for congestion and sore throat.   Respiratory: Negative.  Negative for cough and shortness of breath.   Cardiovascular: Negative.  Negative for chest pain and palpitations.  Gastrointestinal: Negative.  Negative for abdominal pain, blood in stool, diarrhea, melena, nausea and vomiting.  Genitourinary: Negative.  Negative for dysuria and hematuria.  Skin: Negative.  Negative for rash.  Neurological: Negative.  Negative for dizziness and headaches.  All other systems reviewed and are negative.    Today's Vitals   07/17/20 1116 07/17/20 1152  BP: (!) 158/76 138/80  Pulse: 91   Temp: 98.2 F (36.8 C)   TempSrc: Oral   SpO2: 98%   Weight: 128 lb (58.1 kg)    Body mass index is 25 kg/m.   Physical Exam Vitals reviewed.  Constitutional:      Appearance: Normal appearance.  HENT:     Head: Normocephalic.  Eyes:     Extraocular Movements: Extraocular movements intact.     Conjunctiva/sclera: Conjunctivae normal.     Pupils: Pupils are equal, round, and reactive to light.  Cardiovascular:     Rate and Rhythm: Normal rate and regular rhythm.     Pulses: Normal pulses.     Heart sounds: Normal heart sounds.  Pulmonary:     Effort: Pulmonary effort is normal.     Breath sounds: Normal breath sounds.  Musculoskeletal:     Cervical back: Normal range of motion and neck supple.     Comments: Left upper extremity: No swelling,  erythema, or tenderness.  Full range of motion.  Within normal limits.  Neurovascularly intact.  Skin:    General: Skin is warm and dry.     Capillary Refill: Capillary refill takes less than 2 seconds.  Neurological:     General: No focal deficit present.     Mental Status: She is alert and oriented to person, place, and time.  Psychiatric:        Mood and Affect: Mood normal.        Behavior: Behavior normal.      ASSESSMENT & PLAN: A total  of 30 minutes was spent with the patient and counseling/coordination of care regarding multiple chronic medical problems including hypertension and dyslipidemia and cardiovascular risks associated with these conditions, review of all medications and changes made, education on nutrition, chronic anxiety and treatment, review of most recent office visit notes, review of most recent blood work results, health maintenance items, treatment of acute musculoskeletal injuries and pain, prognosis, documentation, and need for follow-up.  Essential hypertension Well-controlled hypertension with normal blood pressure readings at home.  Continue lisinopril 10 mg daily.  Advised to monitor blood pressure readings at home and increase lisinopril to 20 mg daily if readings persistently elevated. Diet and nutrition discussed.  Arm pain, musculoskeletal, left Musculoskeletal pain, now gone.  Dyslipidemia Diet and nutrition discussed.  We will do fasting lipid profile today.  Continue rosuvastatin 10 mg daily.  Chronic anxiety Well-controlled.  Continue Prozac 20 mg daily and alprazolam as needed.  Fenix was seen today for arm pain.  Diagnoses and all orders for this visit:  Arm pain, musculoskeletal, left  Chronic musculoskeletal pain -     CBC with Differential/Platelet -     TSH -     VITAMIN D 25 Hydroxy (Vit-D Deficiency, Fractures)  Essential hypertension -     CBC with Differential/Platelet -     Comprehensive metabolic panel -     Hemoglobin  A1c  Chronic anxiety  Dyslipidemia -     Hemoglobin A1c -     Lipid panel    Patient Instructions   Health Maintenance, Female Adopting a healthy lifestyle and getting preventive care are important in promoting health and wellness. Ask your health care provider about:  The right schedule for you to have regular tests and exams.  Things you can do on your own to prevent diseases and keep yourself healthy. What should I know about diet, weight, and exercise? Eat a healthy diet  Eat a diet that includes plenty of vegetables, fruits, low-fat dairy products, and lean protein.  Do not eat a lot of foods that are high in solid fats, added sugars, or sodium.   Maintain a healthy weight Body mass index (BMI) is used to identify weight problems. It estimates body fat based on height and weight. Your health care provider can help determine your BMI and help you achieve or maintain a healthy weight. Get regular exercise Get regular exercise. This is one of the most important things you can do for your health. Most adults should:  Exercise for at least 150 minutes each week. The exercise should increase your heart rate and make you sweat (moderate-intensity exercise).  Do strengthening exercises at least twice a week. This is in addition to the moderate-intensity exercise.  Spend less time sitting. Even light physical activity can be beneficial. Watch cholesterol and blood lipids Have your blood tested for lipids and cholesterol at 65 years of age, then have this test every 5 years. Have your cholesterol levels checked more often if:  Your lipid or cholesterol levels are high.  You are older than 65 years of age.  You are at high risk for heart disease. What should I know about cancer screening? Depending on your health history and family history, you may need to have cancer screening at various ages. This may include screening for:  Breast cancer.  Cervical cancer.  Colorectal  cancer.  Skin cancer.  Lung cancer. What should I know about heart disease, diabetes, and high blood pressure? Blood pressure and heart disease  High  blood pressure causes heart disease and increases the risk of stroke. This is more likely to develop in people who have high blood pressure readings, are of African descent, or are overweight.  Have your blood pressure checked: ? Every 3-5 years if you are 64-51 years of age. ? Every year if you are 44 years old or older. Diabetes Have regular diabetes screenings. This checks your fasting blood sugar level. Have the screening done:  Once every three years after age 60 if you are at a normal weight and have a low risk for diabetes.  More often and at a younger age if you are overweight or have a high risk for diabetes. What should I know about preventing infection? Hepatitis B If you have a higher risk for hepatitis B, you should be screened for this virus. Talk with your health care provider to find out if you are at risk for hepatitis B infection. Hepatitis C Testing is recommended for:  Everyone born from [birth year] through 1965.  Anyone with known risk factors for hepatitis C. Sexually transmitted infections (STIs)  Get screened for STIs, including gonorrhea and chlamydia, if: ? You are sexually active and are younger than 65 years of age. ? You are older than 65 years of age and your health care provider tells you that you are at risk for this type of infection. ? Your sexual activity has changed since you were last screened, and you are at increased risk for chlamydia or gonorrhea. Ask your health care provider if you are at risk.  Ask your health care provider about whether you are at high risk for HIV. Your health care provider may recommend a prescription medicine to help prevent HIV infection. If you choose to take medicine to prevent HIV, you should first get tested for HIV. You should then be tested every 3 months for as long as  you are taking the medicine. Pregnancy  If you are about to stop having your period (premenopausal) and you may become pregnant, seek counseling before you get pregnant.  Take 400 to 800 micrograms (mcg) of folic acid every day if you become pregnant.  Ask for birth control (contraception) if you want to prevent pregnancy. Osteoporosis and menopause Osteoporosis is a disease in which the bones lose minerals and strength with aging. This can result in bone fractures. If you are 61 years old or older, or if you are at risk for osteoporosis and fractures, ask your health care provider if you should:  Be screened for bone loss.  Take a calcium or vitamin D supplement to lower your risk of fractures.  Be given hormone replacement therapy (HRT) to treat symptoms of menopause. Follow these instructions at home: Lifestyle  Do not use any products that contain nicotine or tobacco, such as cigarettes, e-cigarettes, and chewing tobacco. If you need help quitting, ask your health care provider.  Do not use street drugs.  Do not share needles.  Ask your health care provider for help if you need support or information about quitting drugs. Alcohol use  Do not drink alcohol if: ? Your health care provider tells you not to drink. ? You are pregnant, may be pregnant, or are planning to become pregnant.  If you drink alcohol: ? Limit how much you use to 0-1 drink a day. ? Limit intake if you are breastfeeding.  Be aware of how much alcohol is in your drink. In the [place] one drink equals one 12 oz bottle of beer (  355 mL), one 5 oz glass of wine (148 mL), or one 1 oz glass of hard liquor (44 mL). General instructions  Schedule regular health, dental, and eye exams.  Stay current with your vaccines.  Tell your health care provider if: ? You often feel depressed. ? You have ever been abused or do not feel safe at home. Summary  Adopting a healthy lifestyle and getting preventive care are  important in promoting health and wellness.  Follow your health care provider's instructions about healthy diet, exercising, and getting tested or screened for diseases.  Follow your health care provider's instructions on monitoring your cholesterol and blood pressure. This information is not intended to replace advice given to you by your health care provider. Make sure you discuss any questions you have with your health care provider. Document Revised: 02/16/2018 Document Reviewed: 02/16/2018 Elsevier Patient Education  2021 Elsevier Inc.     Edwina Barth, MD [facility]

## 2020-07-17 NOTE — Assessment & Plan Note (Signed)
Well-controlled hypertension with normal blood pressure readings at home.  Continue lisinopril 10 mg daily.  Advised to monitor blood pressure readings at home and increase lisinopril to 20 mg daily if readings persistently elevated. Diet and nutrition discussed.

## 2020-07-17 NOTE — Patient Instructions (Signed)
Health Maintenance, Female Adopting a healthy lifestyle and getting preventive care are important in promoting health and wellness. Ask your health care provider about:  The right schedule for you to have regular tests and exams.  Things you can do on your own to prevent diseases and keep yourself healthy. What should I know about diet, weight, and exercise? Eat a healthy diet  Eat a diet that includes plenty of vegetables, fruits, low-fat dairy products, and lean protein.  Do not eat a lot of foods that are high in solid fats, added sugars, or sodium.   Maintain a healthy weight Body mass index (BMI) is used to identify weight problems. It estimates body fat based on height and weight. Your health care provider can help determine your BMI and help you achieve or maintain a healthy weight. Get regular exercise Get regular exercise. This is one of the most important things you can do for your health. Most adults should:  Exercise for at least 150 minutes each week. The exercise should increase your heart rate and make you sweat (moderate-intensity exercise).  Do strengthening exercises at least twice a week. This is in addition to the moderate-intensity exercise.  Spend less time sitting. Even light physical activity can be beneficial. Watch cholesterol and blood lipids Have your blood tested for lipids and cholesterol at 65 years of age, then have this test every 5 years. Have your cholesterol levels checked more often if:  Your lipid or cholesterol levels are high.  You are older than 65 years of age.  You are at high risk for heart disease. What should I know about cancer screening? Depending on your health history and family history, you may need to have cancer screening at various ages. This may include screening for:  Breast cancer.  Cervical cancer.  Colorectal cancer.  Skin cancer.  Lung cancer. What should I know about heart disease, diabetes, and high blood  pressure? Blood pressure and heart disease  High blood pressure causes heart disease and increases the risk of stroke. This is more likely to develop in people who have high blood pressure readings, are of African descent, or are overweight.  Have your blood pressure checked: ? Every 3-5 years if you are 18-39 years of age. ? Every year if you are 40 years old or older. Diabetes Have regular diabetes screenings. This checks your fasting blood sugar level. Have the screening done:  Once every three years after age 40 if you are at a normal weight and have a low risk for diabetes.  More often and at a younger age if you are overweight or have a high risk for diabetes. What should I know about preventing infection? Hepatitis B If you have a higher risk for hepatitis B, you should be screened for this virus. Talk with your health care provider to find out if you are at risk for hepatitis B infection. Hepatitis C Testing is recommended for:  Everyone born from 1945 through 1965.  Anyone with known risk factors for hepatitis C. Sexually transmitted infections (STIs)  Get screened for STIs, including gonorrhea and chlamydia, if: ? You are sexually active and are younger than 65 years of age. ? You are older than 65 years of age and your health care provider tells you that you are at risk for this type of infection. ? Your sexual activity has changed since you were last screened, and you are at increased risk for chlamydia or gonorrhea. Ask your health care provider   if you are at risk.  Ask your health care provider about whether you are at high risk for HIV. Your health care provider may recommend a prescription medicine to help prevent HIV infection. If you choose to take medicine to prevent HIV, you should first get tested for HIV. You should then be tested every 3 months for as long as you are taking the medicine. Pregnancy  If you are about to stop having your period (premenopausal) and  you may become pregnant, seek counseling before you get pregnant.  Take 400 to 800 micrograms (mcg) of folic acid every day if you become pregnant.  Ask for birth control (contraception) if you want to prevent pregnancy. Osteoporosis and menopause Osteoporosis is a disease in which the bones lose minerals and strength with aging. This can result in bone fractures. If you are 65 years old or older, or if you are at risk for osteoporosis and fractures, ask your health care provider if you should:  Be screened for bone loss.  Take a calcium or vitamin D supplement to lower your risk of fractures.  Be given hormone replacement therapy (HRT) to treat symptoms of menopause. Follow these instructions at home: Lifestyle  Do not use any products that contain nicotine or tobacco, such as cigarettes, e-cigarettes, and chewing tobacco. If you need help quitting, ask your health care provider.  Do not use street drugs.  Do not share needles.  Ask your health care provider for help if you need support or information about quitting drugs. Alcohol use  Do not drink alcohol if: ? Your health care provider tells you not to drink. ? You are pregnant, may be pregnant, or are planning to become pregnant.  If you drink alcohol: ? Limit how much you use to 0-1 drink a day. ? Limit intake if you are breastfeeding.  Be aware of how much alcohol is in your drink. In the [place] one drink equals one 12 oz bottle of beer (355 mL), one 5 oz glass of wine (148 mL), or one 1 oz glass of hard liquor (44 mL). General instructions  Schedule regular health, dental, and eye exams.  Stay current with your vaccines.  Tell your health care provider if: ? You often feel depressed. ? You have ever been abused or do not feel safe at home. Summary  Adopting a healthy lifestyle and getting preventive care are important in promoting health and wellness.  Follow your health care provider's instructions about healthy  diet, exercising, and getting tested or screened for diseases.  Follow your health care provider's instructions on monitoring your cholesterol and blood pressure. This information is not intended to replace advice given to you by your health care provider. Make sure you discuss any questions you have with your health care provider. Document Revised: 02/16/2018 Document Reviewed: 02/16/2018 Elsevier Patient Education  2021 Elsevier Inc.  

## 2020-07-17 NOTE — Assessment & Plan Note (Signed)
Well-controlled.  Continue Prozac 20 mg daily and alprazolam as needed.

## 2020-07-23 ENCOUNTER — Other Ambulatory Visit: Payer: Self-pay | Admitting: Emergency Medicine

## 2020-07-23 DIAGNOSIS — M25519 Pain in unspecified shoulder: Secondary | ICD-10-CM

## 2020-07-25 ENCOUNTER — Other Ambulatory Visit: Payer: Self-pay | Admitting: Emergency Medicine

## 2020-07-25 DIAGNOSIS — F32A Depression, unspecified: Secondary | ICD-10-CM

## 2020-07-25 DIAGNOSIS — F419 Anxiety disorder, unspecified: Secondary | ICD-10-CM

## 2020-08-09 ENCOUNTER — Other Ambulatory Visit: Payer: Self-pay | Admitting: Emergency Medicine

## 2020-08-09 DIAGNOSIS — M25519 Pain in unspecified shoulder: Secondary | ICD-10-CM

## 2020-08-29 ENCOUNTER — Telehealth (INDEPENDENT_AMBULATORY_CARE_PROVIDER_SITE_OTHER): Payer: 59 | Admitting: Emergency Medicine

## 2020-08-29 ENCOUNTER — Encounter: Payer: Self-pay | Admitting: Emergency Medicine

## 2020-08-29 DIAGNOSIS — R4582 Worries: Secondary | ICD-10-CM

## 2020-08-29 DIAGNOSIS — F418 Other specified anxiety disorders: Secondary | ICD-10-CM | POA: Diagnosis not present

## 2020-08-29 NOTE — Progress Notes (Signed)
Telemedicine Encounter- SOAP NOTE Established Patient Patient: Home  Provider: Office   Patient present only MyChart video encounter This.  Encounter was conducted with the patient's (or proxy's) verbal consent via video telecommunications: yes/no: Yes Patient was instructed to have this encounter in a suitably private space; and to only have persons present to whom they give permission to participate. In addition, patient identity was confirmed by use of name plus two identifiers (DOB and address).  I discussed the limitations, risks, security and privacy concerns of performing an evaluation and management service by telephone and the availability of in person appointments. I also discussed with the patient that there may be a patient responsible charge related to this service. The patient expressed understanding and agreed to proceed.  I spent a total of TIME; 0 MIN TO 60 MIN: 20 minutes talking with the patient or their proxy.  Chief complaint: Concern about Botox injections  Subjective   Terissa Mccleery is a 65 y.o. female established patient. Telephone visit today to discuss Botox injections she is planning on getting back Saturday at dermatologist office.  Concerned about any possible reaction or side effects.  HPI   Patient Active Problem List   Diagnosis Date Noted   Arm pain, musculoskeletal, left 07/17/2020   Chronic musculoskeletal pain 09/13/2019   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   Dyspareunia in female 09/13/2019   Skin tags, multiple acquired 07/27/2019   Chronic insomnia 05/31/2019   Essential hypertension 12/17/2017   Seasonal allergies 12/17/2017   Anxiety and depression 11/13/2016   Chronic anxiety 11/13/2016   Chronic sore throat 11/13/2016   Hot flashes    Situational anxiety    Dyslipidemia     Past Medical History:  Diagnosis Date   Allergy    seasonal   Anxiety    Depression    Hematuria    Hot flashes    Hx of cardiovascular stress  test    Lex MV 12/13:  EF 68%, mild apical thinning, no ischemia   Hyperlipidemia     Current Outpatient Medications  Medication Sig Dispense Refill   ALPRAZolam (XANAX) 0.5 MG tablet TAKE 1 TABLET(0.5 MG) BY MOUTH DAILY AS NEEDED FOR ANXIETY 30 tablet 3   conjugated estrogens (PREMARIN) vaginal cream Place 1 Applicatorful vaginally daily. (Patient not taking: No sig reported) 42.5 g 12   cyclobenzaprine (FLEXERIL) 5 MG tablet Take 1 tablet (5 mg total) by mouth 3 (three) times daily as needed for muscle spasms. 30 tablet 1   FLUoxetine (PROZAC) 20 MG capsule TAKE 1 CAPSULE(20 MG) BY MOUTH DAILY 90 capsule 1   ibuprofen (ADVIL) 600 MG tablet TAKE 1 TABLET(600 MG) BY MOUTH EVERY 8 HOURS AS NEEDED FOR MODERATE PAIN 30 tablet 1   lisinopril (PRINIVIL,ZESTRIL) 10 MG tablet Take 1 tablet (10 mg total) by mouth daily. 90 tablet 3   loratadine (CLARITIN) 10 MG tablet Take 1 tablet (10 mg total) by mouth daily. 30 tablet 11   PREMPRO 0.45-1.5 MG tablet Take 1 tablet by mouth every morning.     rosuvastatin (CRESTOR) 10 MG tablet TAKE 1 TABLET(10 MG) BY MOUTH DAILY 90 tablet 1   sulfamethoxazole-trimethoprim (BACTRIM DS) 800-160 MG tablet Take 1 tablet by mouth 2 (two) times daily. (Patient not taking: Reported on 07/17/2020) 6 tablet 0   traZODone (DESYREL) 50 MG tablet TAKE 1 TABLET(50 MG) BY MOUTH AT BEDTIME AS NEEDED FOR SLEEP 90 tablet 0   No current facility-administered medications for this visit.    Allergies  Allergen Reactions   Macrobid [Nitrofurantoin] Nausea And Vomiting   Hydrocodone Nausea And Vomiting    dizzy   Mucinex Dm [Dm-Guaifenesin Er] Swelling    Sob, throat closing    Social History   Socioeconomic History   Marital status: Legally Separated    Spouse name: Not on file   Number of children: 1   Years of education: 8th grade   Highest education level: Not on file  Occupational History    Employer: UNEMPLOYED  Tobacco Use   Smoking status: Former    Pack years:  0.00   Smokeless tobacco: Never  Vaping Use   Vaping Use: Never used  Substance and Sexual Activity   Alcohol use: No   Drug use: No   Sexual activity: Yes  Other Topics Concern   Not on file  Social History Narrative   Not on file   Social Determinants of Health   Financial Resource Strain: Not on file  Food Insecurity: Not on file  Transportation Needs: Not on file  Physical Activity: Not on file  Stress: Not on file  Social Connections: Not on file  Intimate Partner Violence: Not on file    Review of Systems  Constitutional: Negative.  Negative for chills and fever.  HENT:  Negative for congestion and sore throat.   Respiratory:  Negative for cough and shortness of breath.   Cardiovascular:  Negative for chest pain and palpitations.  Gastrointestinal:  Negative for diarrhea, nausea and vomiting.  Skin: Negative.  Negative for rash.  Neurological:  Negative for dizziness and headaches.  All other systems reviewed and are negative.  Objective  Alert and oriented x3 in no apparent respiratory distress Vitals as reported by the patient: There were no vitals filed for this visit.  There are no diagnoses linked to this encounter. Diagnoses and all orders for this visit:  Worries  Situational anxiety   Clinically stable.  Worried and concerned about Botox injections but willing to take them. Questions regarding this procedure addressed and answered. No medical concerns identified during this visit.  I discussed the assessment and treatment plan with the patient. The patient was provided an opportunity to ask questions and all were answered. The patient agreed with the plan and demonstrated an understanding of the instructions.   The patient was advised to call back or seek an in-person evaluation if the symptoms worsen or if the condition fails to improve as anticipated.  I provided 20 minutes of non-face-to-face time during this encounter.  Georgina Quint,  MD  Primary Care at [facility]

## 2020-09-23 ENCOUNTER — Other Ambulatory Visit: Payer: Self-pay | Admitting: Emergency Medicine

## 2020-09-23 DIAGNOSIS — F32A Depression, unspecified: Secondary | ICD-10-CM

## 2020-09-23 DIAGNOSIS — F419 Anxiety disorder, unspecified: Secondary | ICD-10-CM

## 2020-09-23 DIAGNOSIS — F418 Other specified anxiety disorders: Secondary | ICD-10-CM

## 2020-11-24 ENCOUNTER — Other Ambulatory Visit: Payer: Self-pay | Admitting: Emergency Medicine

## 2020-11-24 DIAGNOSIS — F418 Other specified anxiety disorders: Secondary | ICD-10-CM

## 2020-11-24 DIAGNOSIS — M25519 Pain in unspecified shoulder: Secondary | ICD-10-CM

## 2020-11-24 DIAGNOSIS — F32A Depression, unspecified: Secondary | ICD-10-CM

## 2020-11-25 NOTE — Telephone Encounter (Signed)
Already sent to pharmacy of record this morning.

## 2020-11-25 NOTE — Telephone Encounter (Signed)
Please advise as the pt has called asking for a rx refill of her cyclobenzaprine (FLEXERIL) 5 MG tablet and ALPRAZolam (XANAX) 0.5 MG tablet.  LOV: 07/17/2020

## 2020-12-09 ENCOUNTER — Ambulatory Visit: Payer: 59 | Admitting: Emergency Medicine

## 2020-12-09 ENCOUNTER — Encounter: Payer: Self-pay | Admitting: Emergency Medicine

## 2020-12-09 ENCOUNTER — Ambulatory Visit (INDEPENDENT_AMBULATORY_CARE_PROVIDER_SITE_OTHER): Payer: 59 | Admitting: Emergency Medicine

## 2020-12-09 ENCOUNTER — Other Ambulatory Visit: Payer: Self-pay

## 2020-12-09 VITALS — BP 118/60 | HR 72 | Temp 98.5°F | Ht 60.0 in | Wt 127.0 lb

## 2020-12-09 DIAGNOSIS — S301XXA Contusion of abdominal wall, initial encounter: Secondary | ICD-10-CM | POA: Diagnosis not present

## 2020-12-09 NOTE — Progress Notes (Signed)
Katie Gardner 65 y.o.   Chief Complaint  Patient presents with   Back Pain    Pt has a bruise on the left side of her back, x 4 days    HISTORY OF PRESENT ILLNESS: This is a 65 y.o. female complaining of bruise to left lower flank area she noticed about 4 days ago. Cannot recall any injuries.  No other associated symptoms. No other complaints or medical concerns today.  Back Pain Pertinent negatives include no abdominal pain, chest pain, fever or headaches.    [name]   Medication Sig Start Date End Date Taking? Authorizing Provider  ALPRAZolam (XANAX) 0.5 MG tablet TAKE 1 TABLET(0.5 MG) BY MOUTH DAILY AS NEEDED FOR ANXIETY 11/25/20  Yes Eliyohu Class, Eilleen Kempf, MD  cyclobenzaprine (FLEXERIL) 5 MG tablet TAKE 1 TABLET(5 MG) BY MOUTH THREE TIMES DAILY AS NEEDED FOR MUSCLE SPASMS 11/25/20  Yes Georgina Quint, MD  FLUoxetine (PROZAC) 20 MG capsule TAKE 1 CAPSULE(20 MG) BY MOUTH DAILY 07/25/20  Yes Chava Dulac, Eilleen Kempf, MD  ibuprofen (ADVIL) 600 MG tablet TAKE 1 TABLET(600 MG) BY MOUTH EVERY 8 HOURS AS NEEDED FOR MODERATE PAIN 08/09/20  Yes Jayne Peckenpaugh, Eilleen Kempf, MD  lisinopril (PRINIVIL,ZESTRIL) 10 MG tablet Take 1 tablet (10 mg total) by mouth daily. 04/22/18  Yes Marquis Down, Eilleen Kempf, MD  loratadine (CLARITIN) 10 MG tablet Take 1 tablet (10 mg total) by mouth daily. 09/12/18  Yes Leslye Puccini, Eilleen Kempf, MD  rosuvastatin (CRESTOR) 10 MG tablet TAKE 1 TABLET(10 MG) BY MOUTH DAILY 06/10/20  Yes Caliyah Sieh, Eilleen Kempf, MD  conjugated estrogens (PREMARIN) vaginal cream Place 1 Applicatorful vaginally daily. Patient not taking: Reported on 12/09/2020 07/19/19   Georgina Quint, MD  PREMPRO 0.45-1.5 MG tablet Take 1 tablet by mouth every morning. Patient not taking: Reported on 12/09/2020 05/15/20   [provider]  sulfamethoxazole-trimethoprim (BACTRIM DS) 800-160 MG tablet Take 1 tablet by mouth 2 (two) times daily. Patient not taking: No sig reported 03/12/20    Janeece Agee, NP    Allergies  Allergen Reactions   Macrobid [Nitrofurantoin] Nausea And Vomiting   Hydrocodone Nausea And Vomiting    dizzy   Mucinex Dm [Dm-Guaifenesin Er] Swelling    Sob, throat closing    Patient Active Problem List   Diagnosis Date Noted   Arm pain, musculoskeletal, left 07/17/2020   Chronic musculoskeletal pain 09/13/2019   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   Dyspareunia in female 09/13/2019   Skin tags, multiple acquired 07/27/2019   Chronic insomnia 05/31/2019   Essential hypertension 12/17/2017   Seasonal allergies 12/17/2017   Anxiety and depression 11/13/2016   Chronic anxiety 11/13/2016   Chronic sore throat 11/13/2016   Hot flashes    Situational anxiety    Dyslipidemia     Past Medical History:  Diagnosis Date   Allergy    seasonal   Anxiety    Depression    Hematuria    Hot flashes    Hx of cardiovascular stress test    Lex MV 12/13:  EF 68%, mild apical thinning, no ischemia   Hyperlipidemia     Past Surgical History:  Procedure Laterality Date   BREAST LUMPECTOMY     right breast, not cancer   Carpel tunnel     ECTOPIC PREGNANCY SURGERY      Social History   Socioeconomic History   Marital status: Legally Separated    Spouse name: Not on file   Number of children: 1   Years of  education: 8th grade   Highest education level: Not on file  Occupational History    Employer: UNEMPLOYED  Tobacco Use   Smoking status: Former   Smokeless tobacco: Never  Vaping Use   Vaping Use: Never used  Substance and Sexual Activity   Alcohol use: No   Drug use: No   Sexual activity: Yes  Other Topics Concern   Not on file  Social History Narrative   Not on file   Social Determinants of Health   Financial Resource Strain: Not on file  Food Insecurity: Not on file  Transportation Needs: Not on file  Physical Activity: Not on file  Stress: Not on file  Social Connections: Not on file  Intimate Partner  Violence: Not on file    Family History  Problem Relation Age of Onset   Heart disease Mother    Diabetes Mother    Esophageal cancer Father        33's   Colon cancer Neg Hx      Review of Systems  Constitutional: Negative.  Negative for chills and fever.  HENT: Negative.  Negative for congestion and sore throat.   Respiratory: Negative.  Negative for cough and shortness of breath.   Cardiovascular: Negative.  Negative for chest pain and palpitations.  Gastrointestinal:  Negative for abdominal pain, diarrhea, nausea and vomiting.  Genitourinary: Negative.  Negative for hematuria.  Musculoskeletal:  Positive for back pain.  Skin: Negative.  Negative for rash.  Neurological:  Negative for dizziness and headaches.  All other systems reviewed and are negative.  Today's Vitals   12/09/20 1527  BP: 118/60  Pulse: 72  Temp: 98.5 F (36.9 C)  TempSrc: Oral  SpO2: 98%  Weight: 127 lb (57.6 kg)  Height: 5' (1.524 m)   Body mass index is 24.8 kg/m. Wt Readings from Last 3 Encounters:  12/09/20 127 lb (57.6 kg)  07/17/20 128 lb (58.1 kg)  03/05/20 126 lb (57.2 kg)    Physical Exam Vitals reviewed.  Constitutional:      Appearance: Normal appearance.  HENT:     Head: Normocephalic.  Eyes:     Extraocular Movements: Extraocular movements intact.     Pupils: Pupils are equal, round, and reactive to light.  Cardiovascular:     Rate and Rhythm: Normal rate.  Pulmonary:     Effort: Pulmonary effort is normal.  Abdominal:     Palpations: Abdomen is soft.     Tenderness: There is no abdominal tenderness.  Skin:    Capillary Refill: Capillary refill takes less than 2 seconds.     Comments: Visible round ecchymotic area to left lower flank area compatible with old bruise secondary to injury  Neurological:     General: No focal deficit present.     Mental Status: She is alert and oriented to person, place, and time.  Psychiatric:        Mood and Affect: Mood normal.         Behavior: Behavior normal.     ASSESSMENT & PLAN: Problem List Items Addressed This Visit       Other   Hematoma of left flank - Primary    Findings compatible with hematoma secondary to injury.  Healing well.  No complications.  No clinical concerns.      Patient Instructions  Hematoma Hematoma Un hematoma es una acumulacin de [place]. El hematoma puede producirse en los siguientes lugares: Debajo de la piel. En un rgano. En un espacio del cuerpo.  En el espacio de Risk analyst. En otros tejidos. La sangre puede espesarse (coagularse) para formar un bulto que se puede ver y Paediatric nurse. El bulto suele ser duro y puede volverse doloroso y sensible al tacto. El bulto puede ser muy pequeo o [place]. La mayora de los hematomas mejoran en unos pocos das o [place]. Sin embargo, algunos hematomas pueden ser graves y Pension scheme manager atencin mdica. Cules son las causas? Las causas de esta afeccin son: Neomia Dear lesin. La prdida de sangre debajo de la piel. Problemas debido a cirugas. Afecciones mdicas que causan sangrado o moretones. Qu incrementa el riesgo? Es ms probable que contraiga esta afeccin si: Es Psychiatrist. Utiliza medicamentos que [employer]. Cules son los signos o los sntomas? Los sntomas dependen de la ubicacin que tiene el hematoma en el cuerpo. Si el hematoma se encuentra debajo de la piel, existe: Un bulto firme en el cuerpo. Dolor y sensibilidad en la zona. Moretones. La piel [employer] bulto puede ser de color [place], [place] oscuro, rojo prpura o Surveyor, quantity. Si el hematoma est en un lugar profundo de los tejidos o espacios corporales, puede haber: Probation officer. Esto puede causar dolor en el vientre (abdomen), debilidad, desmayos y falta de aire. Sangre dentro de la cabeza. Esto puede causar dolor de cabeza, debilidad, dificultad para hablar o comprender el habla, o desmayos. Cmo se diagnostica? Esta afeccin se diagnostica en  funcin de lo siguiente: Sus antecedentes mdicos. Un examen fsico. Estudios de diagnstico por imgenes, como una ecografa o una exploracin por tomografa computarizada (TC). Anlisis de [place]. Cmo se trata? El tratamiento depende de la causa, del tamao y de la ubicacin del hematoma. El tratamiento puede incluir: No hacer nada. Muchos hematomas desaparecen por s solos, sin tratamiento. Ciruga o control minucioso. Esto puede ser necesario para los hematomas grandes o los hematomas que afectan a los rganos del cuerpo. Medicamentos. Estos pueden administrarse si el hematoma se debe a Programmer, applications. Siga estas indicaciones en su casa: Control del dolor, la rigidez y la hinchazn  Si se lo indican, aplique hielo sobre la zona. Ponga el hielo en una bolsa plstica. Coloque una toalla entre la piel y Copy. Deje el hielo durante 20 minutos, de 2 a 3 veces por da, durante los [employer]. Si se lo indican, aplique calor en la zona afectada despus de haber aplicado hielo en la zona [facility]. Use la fuente de calor que el mdico le indique. Esta podra ser Neomia Dear compresa hmeda caliente o una almohadilla trmica. Para hacer esto: Coloque una toalla entre la piel y la fuente de Airline pilot. Aplique calor durante 20 a 30 minutos. Retire la fuente de calor si la piel se pone de color rojo brillante. Esto es muy importante si no puede Financial risk analyst, calor o fro. Puede correr un riesgo mayor de sufrir quemaduras. Cuando est sentado o acostado, levante (eleve) la zona afectada por encima del nivel del corazn. Envuelva la zona afectada con una venda elstica, si as se lo indic su mdico. No ajuste demasiado la venda. Si el hematoma est en una pierna o en un pie y [employer] doloroso, el mdico puede darle [place]. Utilcelas como se lo haya indicado el mdico. Indicaciones generales [employer] medicamentos de venta libre y los recetados solamente como se lo haya indicado el  mdico. Oceanographer a todas las visitas de seguimiento como se lo haya indicado el mdico. Esto es importante. Comunquese con un mdico  si: Tiene fiebre. Los moretones o la Garment/textile technologist. Comienzan a aparecerle ms hematomas. Solicite ayuda inmediatamente si: El Product/process development scientist. El dolor no mejora con medicamentos. La piel sobre el hematoma se agrieta o comienza a Geophysicist/field seismologist. El hematoma est en su pecho o su vientre y usted: Se desmaya. Se siente dbil. Le falta el aire. Tiene un hematoma en el cuero cabelludo causado por una cada o una lesin, y usted: Tiene dolor de cabeza que [place]. Tiene dificultad para hablar o comprender el lenguaje. Le disminuye el nivel de alerta o se desmaya. Resumen Un hematoma es una acumulacin de sangre en cualquier parte del cuerpo. La mayora de los hematomas mejoran por s solos en unos pocos das o [employer]. Algunos pueden necesitar atencin mdica. Siga las indicaciones del mdico sobre cmo debe cuidar el hematoma. Comunquese con un mdico si la hinchazn o los moretones empeoran, o si le falta el aire. Esta informacin no tiene Theme park manager el consejo del mdico. Asegrese de hacerle al mdico cualquier pregunta que tenga. Document Revised: 09/02/2017 Document Reviewed: 09/02/2017 Elsevier Patient Education  2022 Elsevier Inc. Health Maintenance, Female Adopting a healthy lifestyle and getting preventive care are important in promoting health and wellness. Ask your health care provider about: The right schedule for you to have regular tests and exams. Things you can do on your own to prevent diseases and keep yourself healthy. What should I know about diet, weight, and exercise? Eat a healthy diet  Eat a diet that includes plenty of vegetables, fruits, low-fat dairy products, and lean protein. Do not eat a lot of foods that are high in solid fats, added sugars, or sodium. Maintain a healthy weight Body mass index (BMI) is used to  identify weight problems. It estimates body fat based on height and weight. Your health care provider can help determine your BMI and help you achieve or maintain a healthy weight. Get regular exercise Get regular exercise. This is one of the most important things you can do for your health. Most adults should: Exercise for at least 150 minutes each week. The exercise should increase your heart rate and make you sweat (moderate-intensity exercise). Do strengthening exercises at least twice a week. This is in addition to the moderate-intensity exercise. Spend less time sitting. Even light physical activity can be beneficial. Watch cholesterol and blood lipids Have your blood tested for lipids and cholesterol at 65 years of age, then have this test every 5 years. Have your cholesterol levels checked more often if: Your lipid or cholesterol levels are high. You are older than 65 years of age. You are at high risk for heart disease. What should I know about cancer screening? Depending on your health history and family history, you may need to have cancer screening at various ages. This may include screening for: Breast cancer. Cervical cancer. Colorectal cancer. Skin cancer. Lung cancer. What should I know about heart disease, diabetes, and high blood pressure? Blood pressure and heart disease High blood pressure causes heart disease and increases the risk of stroke. This is more likely to develop in people who have high blood pressure readings, are of African descent, or are overweight. Have your blood pressure checked: Every 3-5 years if you are 88-35 years of age. Every year if you are 21 years old or older. Diabetes Have regular diabetes screenings. This checks your fasting blood sugar level. Have the screening done: Once every three years after age 34 if you are at a normal weight  and have a low risk for diabetes. More often and at a younger age if you are overweight or have a high risk  for diabetes. What should I know about preventing infection? Hepatitis B If you have a higher risk for hepatitis B, you should be screened for this virus. Talk with your health care provider to find out if you are at risk for hepatitis B infection. Hepatitis C Testing is recommended for: Everyone born from [birth year] through 1965. Anyone with known risk factors for hepatitis C. Sexually transmitted infections (STIs) Get screened for STIs, including gonorrhea and chlamydia, if: You are sexually active and are younger than 65 years of age. You are older than 65 years of age and your health care provider tells you that you are at risk for this type of infection. Your sexual activity has changed since you were last screened, and you are at increased risk for chlamydia or gonorrhea. Ask your health care provider if you are at risk. Ask your health care provider about whether you are at high risk for HIV. Your health care provider may recommend a prescription medicine to help prevent HIV infection. If you choose to take medicine to prevent HIV, you should first get tested for HIV. You should then be tested every 3 months for as long as you are taking the medicine. Pregnancy If you are about to stop having your period (premenopausal) and you may become pregnant, seek counseling before you get pregnant. Take 400 to 800 micrograms (mcg) of folic acid every day if you become pregnant. Ask for birth control (contraception) if you want to prevent pregnancy. Osteoporosis and menopause Osteoporosis is a disease in which the bones lose minerals and strength with aging. This can result in bone fractures. If you are 45 years old or older, or if you are at risk for osteoporosis and fractures, ask your health care provider if you should: Be screened for bone loss. Take a calcium or vitamin D supplement to lower your risk of fractures. Be given hormone replacement therapy (HRT) to treat symptoms of menopause. Follow  these instructions at home: Lifestyle Do not use any products that contain nicotine or tobacco, such as cigarettes, e-cigarettes, and chewing tobacco. If you need help quitting, ask your health care provider. Do not use street drugs. Do not share needles. Ask your health care provider for help if you need support or information about quitting drugs. Alcohol use Do not drink alcohol if: Your health care provider tells you not to drink. You are pregnant, may be pregnant, or are planning to become pregnant. If you drink alcohol: Limit how much you use to 0-1 drink a day. Limit intake if you are breastfeeding. Be aware of how much alcohol is in your drink. In the [place] one drink equals one 12 oz bottle of beer (355 mL), one 5 oz glass of wine (148 mL), or one 1 oz glass of hard liquor (44 mL). General instructions Schedule regular health, dental, and eye exams. Stay current with your vaccines. Tell your health care provider if: You often feel depressed. You have ever been abused or do not feel safe at home. Summary Adopting a healthy lifestyle and getting preventive care are important in promoting health and wellness. Follow your health care provider's instructions about healthy diet, exercising, and getting tested or screened for diseases. Follow your health care provider's instructions on monitoring your cholesterol and blood pressure. This information is not intended to replace advice given to you by your  health care provider. Make sure you discuss any questions you have with your health care provider. Document Revised: 05/03/2020 Document Reviewed: 02/16/2018 Elsevier Patient Education  2022 Elsevier Inc.    Edwina Barth, MD [facility]

## 2020-12-09 NOTE — Patient Instructions (Addendum)
Hematoma Hematoma Un hematoma es una acumulacin de [place]. El hematoma puede producirse en los siguientes lugares: Debajo de la piel. En un rgano. En un espacio del cuerpo. En el espacio de Risk analyst. En otros tejidos. La sangre puede espesarse (coagularse) para formar un bulto que se puede ver y Paediatric nurse. El bulto suele ser duro y puede volverse doloroso y sensible al tacto. El bulto puede ser muy pequeo o [place]. La mayora de los hematomas mejoran en unos pocos das o [place]. Sin embargo, algunos hematomas pueden ser graves y Pension scheme manager atencin mdica. Cules son las causas? Las causas de esta afeccin son: Neomia Dear lesin. La prdida de sangre debajo de la piel. Problemas debido a cirugas. Afecciones mdicas que causan sangrado o moretones. Qu incrementa el riesgo? Es ms probable que contraiga esta afeccin si: Es Psychiatrist. Utiliza medicamentos que [employer]. Cules son los signos o los sntomas? Los sntomas dependen de la ubicacin que tiene el hematoma en el cuerpo. Si el hematoma se encuentra debajo de la piel, existe: Un bulto firme en el cuerpo. Dolor y sensibilidad en la zona. Moretones. La piel [employer] bulto puede ser de color [place] oscuro, rojo prpura o Surveyor, quantity. Si el hematoma est en un lugar profundo de los tejidos o espacios corporales, puede haber: Probation officer. Esto puede causar dolor en el vientre (abdomen), debilidad, desmayos y falta de aire. Sangre dentro de la cabeza. Esto puede causar dolor de cabeza, debilidad, dificultad para hablar o comprender el habla, o desmayos. Cmo se diagnostica? Esta afeccin se diagnostica en funcin de lo siguiente: Sus antecedentes mdicos. Un examen fsico. Estudios de diagnstico por imgenes, como una ecografa o una exploracin por tomografa computarizada (TC). Anlisis de [place]. Cmo se trata? El tratamiento depende de la causa, del tamao y de la ubicacin del hematoma.  El tratamiento puede incluir: No hacer nada. Muchos hematomas desaparecen por s solos, sin tratamiento. Ciruga o control minucioso. Esto puede ser necesario para los hematomas grandes o los hematomas que afectan a los rganos del cuerpo. Medicamentos. Estos pueden administrarse si el hematoma se debe a Programmer, applications. Siga estas indicaciones en su casa: Control del dolor, la rigidez y la hinchazn  Si se lo indican, aplique hielo sobre la zona. Ponga el hielo en una bolsa plstica. Coloque una toalla entre la piel y Copy. Deje el hielo durante 20 minutos, de 2 a 3 veces por da, durante los [employer]. Si se lo indican, aplique calor en la zona afectada despus de haber aplicado hielo en la zona [facility]. Use la fuente de calor que el mdico le indique. Esta podra ser Neomia Dear compresa hmeda caliente o una almohadilla trmica. Para hacer esto: Coloque una toalla entre la piel y la fuente de Airline pilot. Aplique calor durante 20 a 30 minutos. Retire la fuente de calor si la piel se pone de color rojo brillante. Esto es muy importante si no puede Financial risk analyst, calor o fro. Puede correr un riesgo mayor de sufrir quemaduras. Cuando est sentado o acostado, levante (eleve) la zona afectada por encima del nivel del corazn. Envuelva la zona afectada con una venda elstica, si as se lo indic su mdico. No ajuste demasiado la venda. Si el hematoma est en una pierna o en un pie y [employer] doloroso, el mdico puede darle [place]. Utilcelas como se lo haya indicado el mdico. Indicaciones generales [employer] medicamentos de venta libre y los recetados solamente  como se lo haya indicado el mdico. Concurra a todas las visitas de seguimiento como se lo haya indicado el mdico. Esto es importante. Comunquese con un mdico si: Tiene fiebre. Los moretones o la Garment/textile technologist. Comienzan a aparecerle ms hematomas. Solicite ayuda inmediatamente si: El Product/process development scientist. El dolor no  mejora con medicamentos. La piel sobre el hematoma se agrieta o comienza a Geophysicist/field seismologist. El hematoma est en su pecho o su vientre y usted: Se desmaya. Se siente dbil. Le falta el aire. Tiene un hematoma en el cuero cabelludo causado por una cada o una lesin, y usted: Tiene dolor de cabeza que [place]. Tiene dificultad para hablar o comprender el lenguaje. Le disminuye el nivel de alerta o se desmaya. Resumen Un hematoma es una acumulacin de sangre en cualquier parte del cuerpo. La mayora de los hematomas mejoran por s solos en unos pocos das o [place]. Algunos pueden necesitar atencin mdica. Siga las indicaciones del mdico sobre cmo debe cuidar el hematoma. Comunquese con un mdico si la hinchazn o los moretones empeoran, o si le falta el aire. Esta informacin no tiene Theme park manager el consejo del mdico. Asegrese de hacerle al mdico cualquier pregunta que tenga. Document Revised: 09/02/2017 Document Reviewed: 09/02/2017 Elsevier Patient Education  2022 Elsevier Inc. Health Maintenance, Female Adopting a healthy lifestyle and getting preventive care are important in promoting health and wellness. Ask your health care provider about: The right schedule for you to have regular tests and exams. Things you can do on your own to prevent diseases and keep yourself healthy. What should I know about diet, weight, and exercise? Eat a healthy diet  Eat a diet that includes plenty of vegetables, fruits, low-fat dairy products, and lean protein. Do not eat a lot of foods that are high in solid fats, added sugars, or sodium. Maintain a healthy weight Body mass index (BMI) is used to identify weight problems. It estimates body fat based on height and weight. Your health care provider can help determine your BMI and help you achieve or maintain a healthy weight. Get regular exercise Get regular exercise. This is one of the most important things you can do for your health. Most  adults should: Exercise for at least 150 minutes each week. The exercise should increase your heart rate and make you sweat (moderate-intensity exercise). Do strengthening exercises at least twice a week. This is in addition to the moderate-intensity exercise. Spend less time sitting. Even light physical activity can be beneficial. Watch cholesterol and blood lipids Have your blood tested for lipids and cholesterol at 65 years of age, then have this test every 5 years. Have your cholesterol levels checked more often if: Your lipid or cholesterol levels are high. You are older than 65 years of age. You are at high risk for heart disease. What should I know about cancer screening? Depending on your health history and family history, you may need to have cancer screening at various ages. This may include screening for: Breast cancer. Cervical cancer. Colorectal cancer. Skin cancer. Lung cancer. What should I know about heart disease, diabetes, and high blood pressure? Blood pressure and heart disease High blood pressure causes heart disease and increases the risk of stroke. This is more likely to develop in people who have high blood pressure readings, are of African descent, or are overweight. Have your blood pressure checked: Every 3-5 years if you are 98-101 years of age. Every year if you are 51 years old or older. Diabetes  Have regular diabetes screenings. This checks your fasting blood sugar level. Have the screening done: Once every three years after age 63 if you are at a normal weight and have a low risk for diabetes. More often and at a younger age if you are overweight or have a high risk for diabetes. What should I know about preventing infection? Hepatitis B If you have a higher risk for hepatitis B, you should be screened for this virus. Talk with your health care provider to find out if you are at risk for hepatitis B infection. Hepatitis C Testing is recommended  for: Everyone born from [birth year] through 1965. Anyone with known risk factors for hepatitis C. Sexually transmitted infections (STIs) Get screened for STIs, including gonorrhea and chlamydia, if: You are sexually active and are younger than 65 years of age. You are older than 65 years of age and your health care provider tells you that you are at risk for this type of infection. Your sexual activity has changed since you were last screened, and you are at increased risk for chlamydia or gonorrhea. Ask your health care provider if you are at risk. Ask your health care provider about whether you are at high risk for HIV. Your health care provider may recommend a prescription medicine to help prevent HIV infection. If you choose to take medicine to prevent HIV, you should first get tested for HIV. You should then be tested every 3 months for as long as you are taking the medicine. Pregnancy If you are about to stop having your period (premenopausal) and you may become pregnant, seek counseling before you get pregnant. Take 400 to 800 micrograms (mcg) of folic acid every day if you become pregnant. Ask for birth control (contraception) if you want to prevent pregnancy. Osteoporosis and menopause Osteoporosis is a disease in which the bones lose minerals and strength with aging. This can result in bone fractures. If you are 56 years old or older, or if you are at risk for osteoporosis and fractures, ask your health care provider if you should: Be screened for bone loss. Take a calcium or vitamin D supplement to lower your risk of fractures. Be given hormone replacement therapy (HRT) to treat symptoms of menopause. Follow these instructions at home: Lifestyle Do not use any products that contain nicotine or tobacco, such as cigarettes, e-cigarettes, and chewing tobacco. If you need help quitting, ask your health care provider. Do not use street drugs. Do not share needles. Ask your health care provider  for help if you need support or information about quitting drugs. Alcohol use Do not drink alcohol if: Your health care provider tells you not to drink. You are pregnant, may be pregnant, or are planning to become pregnant. If you drink alcohol: Limit how much you use to 0-1 drink a day. Limit intake if you are breastfeeding. Be aware of how much alcohol is in your drink. In the [place] one drink equals one 12 oz bottle of beer (355 mL), one 5 oz glass of wine (148 mL), or one 1 oz glass of hard liquor (44 mL). General instructions Schedule regular health, dental, and eye exams. Stay current with your vaccines. Tell your health care provider if: You often feel depressed. You have ever been abused or do not feel safe at home. Summary Adopting a healthy lifestyle and getting preventive care are important in promoting health and wellness. Follow your health care provider's instructions about healthy diet, exercising, and getting tested or  screened for diseases. Follow your health care provider's instructions on monitoring your cholesterol and blood pressure. This information is not intended to replace advice given to you by your health care provider. Make sure you discuss any questions you have with your health care provider. Document Revised: 05/03/2020 Document Reviewed: 02/16/2018 Elsevier Patient Education  2022 [employer].
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
## 2020-12-26 ENCOUNTER — Ambulatory Visit (INDEPENDENT_AMBULATORY_CARE_PROVIDER_SITE_OTHER): Payer: 59

## 2020-12-26 DIAGNOSIS — Z0000 Encounter for general adult medical examination without abnormal findings: Secondary | ICD-10-CM | POA: Diagnosis not present

## 2020-12-26 NOTE — Progress Notes (Addendum)
I connected with Katie Gardner today by telephone and verified that I am speaking with the correct person using two identifiers. Location patient: home Location provider: work Persons participating in the virtual visit: patient, provider.   I discussed the limitations, risks, security and privacy concerns of performing an evaluation and management service by telephone and the availability of in person appointments. I also discussed with the patient that there may be a patient responsible charge related to this service. The patient expressed understanding and verbally consented to this telephonic visit.    Interactive audio and video telecommunications were attempted between this provider and patient, however failed, due to patient having technical difficulties OR patient did not have access to video capability.  We continued and completed visit with audio only.  Some vital signs may be absent or patient reported.   Time Spent with patient on telephone encounter: 40 minutes  Subjective:   Katie Gardner is a 65 y.o. female who presents for Medicare Annual (Subsequent) preventive examination.  Review of Systems     Cardiac Risk Factors include: family history of premature cardiovascular disease;advanced age (>59men, >76 women);dyslipidemia;sedentary lifestyle     Objective:    There were no vitals filed for this visit. There is no height or weight on file to calculate BMI.  Advanced Directives 12/26/2020 04/12/2017  Does Patient Have a Medical Advance Directive? No No  Would patient like information on creating a medical advance directive? No - Patient declined Yes (MAU/Ambulatory/Procedural Areas - Information given)    Current Medications (verified) Outpatient Encounter Medications as of 12/26/2020  Medication Sig   ALPRAZolam (XANAX) 0.5 MG tablet TAKE 1 TABLET(0.5 MG) BY MOUTH DAILY AS NEEDED FOR ANXIETY   conjugated estrogens (PREMARIN) vaginal cream Place 1 Applicatorful vaginally  daily. (Patient not taking: Reported on 12/09/2020)   cyclobenzaprine (FLEXERIL) 5 MG tablet TAKE 1 TABLET(5 MG) BY MOUTH THREE TIMES DAILY AS NEEDED FOR MUSCLE SPASMS   FLUoxetine (PROZAC) 20 MG capsule TAKE 1 CAPSULE(20 MG) BY MOUTH DAILY   ibuprofen (ADVIL) 600 MG tablet TAKE 1 TABLET(600 MG) BY MOUTH EVERY 8 HOURS AS NEEDED FOR MODERATE PAIN   lisinopril (PRINIVIL,ZESTRIL) 10 MG tablet Take 1 tablet (10 mg total) by mouth daily.   loratadine (CLARITIN) 10 MG tablet Take 1 tablet (10 mg total) by mouth daily.   PREMPRO 0.45-1.5 MG tablet Take 1 tablet by mouth every morning. (Patient not taking: Reported on 12/09/2020)   rosuvastatin (CRESTOR) 10 MG tablet TAKE 1 TABLET(10 MG) BY MOUTH DAILY   sulfamethoxazole-trimethoprim (BACTRIM DS) 800-160 MG tablet Take 1 tablet by mouth 2 (two) times daily. (Patient not taking: No sig reported)   No facility-administered encounter medications on file as of 12/26/2020.    Allergies (verified) Macrobid [nitrofurantoin], Hydrocodone, and Mucinex dm [dm-guaifenesin er]   History: Past Medical History:  Diagnosis Date   Allergy    seasonal   Anxiety    Depression    Hematuria    Hot flashes    Hx of cardiovascular stress test    Lex MV 12/13:  EF 68%, mild apical thinning, no ischemia   Hyperlipidemia    Past Surgical History:  Procedure Laterality Date   BREAST LUMPECTOMY     right breast, not cancer   Carpel tunnel     ECTOPIC PREGNANCY SURGERY     Family History  Problem Relation Age of Onset   Heart disease Mother    Diabetes Mother    Esophageal cancer Father  70's   Colon cancer Neg Hx    Social History   Socioeconomic History   Marital status: Legally Separated    Spouse name: Not on file   Number of children: 1   Years of education: 8th grade   Highest education level: Not on file  Occupational History    Employer: UNEMPLOYED  Tobacco Use   Smoking status: Former   Smokeless tobacco: Never  Building services engineer  Use: Never used  Substance and Sexual Activity   Alcohol use: No   Drug use: No   Sexual activity: Yes  Other Topics Concern   Not on file  Social History Narrative   Not on file   Social Determinants of Health   Financial Resource Strain: Low Risk    Difficulty of Paying Living Expenses: Not hard at all  Food Insecurity: No Food Insecurity   Worried About Programme researcher, broadcasting/film/video in the Last Year: Never true   Ran Out of Food in the Last Year: Never true  Transportation Needs: No Transportation Needs   Lack of Transportation (Medical): No   Lack of Transportation (Non-Medical): No  Physical Activity: Inactive   Days of Exercise per Week: 0 days   Minutes of Exercise per Session: 0 min  Stress: No Stress Concern Present   Feeling of Stress : Not at all  Social Connections: Socially Isolated   Frequency of Communication with Friends and Family: Once a week   Frequency of Social Gatherings with Friends and Family: Once a week   Attends Religious Services: Never   Database administrator or Organizations: No   Attends Engineer, structural: Never   Marital Status: Never married    Tobacco Counseling Counseling given: Not Answered   Clinical Intake:  Pre-visit preparation completed: Yes  Pain : No/denies pain     Nutritional Risks: None Diabetes: No  How often do you need to have someone help you when you read instructions, pamphlets, or other written materials from your doctor or pharmacy?: 1 - Never  Diabetic? no  Interpreter Needed?: No  Information entered by :: Susie Cassette, LPN   Activities of Daily Living In your present state of health, do you have any difficulty performing the following activities: 12/26/2020  Hearing? N  Vision? N  Difficulty concentrating or making decisions? Y  Walking or climbing stairs? N  Dressing or bathing? N  Doing errands, shopping? N  Preparing Food and eating ? N  Using the Toilet? N  In the past six months,  have you accidently leaked urine? Y  Do you have problems with loss of bowel control? N  Managing your Medications? N  Managing your Finances? N  Housekeeping or managing your Housekeeping? N  Some recent data might be hidden    Patient Care Team: Georgina Quint, MD as PCP - General (Internal Medicine)  Indicate any recent Medical Services you may have received from other than [facility] providers in the past year (date may be approximate).     Assessment:   This is a routine wellness examination for Shylo.  Hearing/Vision screen Hearing Screening - Comments:: Patient denied any hearing difficulty.   No hearing aids.  Vision Screening - Comments:: Patient denied any vision difficulty. Patient does not wear any corrective lenses/contacts.  No current optometrist.  Dietary issues and exercise activities discussed: Current Exercise Habits: The patient does not participate in regular exercise at present, Exercise limited by: None identified;psychological condition(s)   Goals  Addressed   None   Depression Screen PHQ 2/9 Scores 12/26/2020 12/09/2020 07/17/2020 03/05/2020 02/14/2020 11/21/2019 09/13/2019  PHQ - 2 Score 0 0 2 0 0 0 0  PHQ- 9 Score - - 5 - - - -    Fall Risk Fall Risk  12/26/2020 12/09/2020 03/05/2020 02/14/2020 11/21/2019  Falls in the past year? 0 0 0 0 0  Number falls in past yr: 0 0 0 - -  Injury with Fall? 0 0 0 - -  Comment - - - - -  Risk for fall due to : No Fall Risks - - - -  Risk for fall due to: Comment - - - - -  Follow up Falls evaluation completed - Falls evaluation completed Falls evaluation completed Falls evaluation completed    FALL RISK PREVENTION PERTAINING TO THE HOME:  Any stairs in or around the home? No  If so, are there any without handrails? No  Home free of loose throw rugs in walkways, pet beds, electrical cords, etc? Yes  Adequate lighting in your home to reduce risk of falls? Yes   ASSISTIVE DEVICES UTILIZED TO PREVENT  FALLS:  Life alert? No  Use of a cane, walker or w/c? No  Grab bars in the bathroom? No  Shower chair or bench in shower? No  Elevated toilet seat or a handicapped toilet?  no  TIMED UP AND GO:  Was the test performed? No .  Length of time to ambulate 10 feet: n/a sec.   Cognitive Function: Normal cognitive status assessed by direct observation by this Nurse Health Advisor. No abnormalities found.       6CIT Screen 04/12/2017  What Year? 0 points  What month? 0 points  What time? 0 points  Count back from 20 0 points  Months in reverse 0 points  Repeat phrase 2 points  Total Score 2    Immunizations Immunization History  Administered Date(s) Administered   DT (Pediatric) 03/09/2008   Influenza,inj,Quad PF,6+ Mos 06/06/2018, 03/15/2019   Influenza-Unspecified 11/25/2011    TDAP status: Due, Education has been provided regarding the importance of this vaccine. Advised may receive this vaccine at local pharmacy or Health Dept. Aware to provide a copy of the vaccination record if obtained from local pharmacy or Health Dept. Verbalized acceptance and understanding.  Flu Vaccine status: Declined, Education has been provided regarding the importance of this vaccine but patient still declined. Advised may receive this vaccine at local pharmacy or Health Dept. Aware to provide a copy of the vaccination record if obtained from local pharmacy or Health Dept. Verbalized acceptance and understanding.  Pneumococcal vaccine status: Declined,  Education has been provided regarding the importance of this vaccine but patient still declined. Advised may receive this vaccine at local pharmacy or Health Dept. Aware to provide a copy of the vaccination record if obtained from local pharmacy or Health Dept. Verbalized acceptance and understanding.   Covid-19 vaccine status: Declined, Education has been provided regarding the importance of this vaccine but patient still declined. Advised may receive  this vaccine at local pharmacy or Health Dept.or vaccine clinic. Aware to provide a copy of the vaccination record if obtained from local pharmacy or Health Dept. Verbalized acceptance and understanding.  Qualifies for Shingles Vaccine? Yes   Zostavax completed No   Shingrix Completed?: No.    Education has been provided regarding the importance of this vaccine. Patient has been advised to call insurance company to determine out of pocket expense if they  have not yet received this vaccine. Advised may also receive vaccine at local pharmacy or Health Dept. Verbalized acceptance and understanding.  Screening Tests Health Maintenance  Topic Date Due   Pneumonia Vaccine 10+ Years old (1 - PCV) Never done   Hepatitis C Screening  Never done   TETANUS/TDAP  Never done   Zoster Vaccines- Shingrix (1 of 2) Never done   PAP SMEAR-Modifier  Never done   COLONOSCOPY (Pts 45-17yrs Insurance coverage will need to be confirmed)  Never done   MAMMOGRAM  01/11/2014   INFLUENZA VACCINE  10/07/2020   DEXA SCAN  Never done   COVID-19 Vaccine (1) 02/06/2021 (Originally 05/29/1956)   HIV Screening  Completed   HPV VACCINES  Aged Out    Health Maintenance  Health Maintenance Due  Topic Date Due   Pneumonia Vaccine 71+ Years old (1 - PCV) Never done   Hepatitis C Screening  Never done   TETANUS/TDAP  Never done   Zoster Vaccines- Shingrix (1 of 2) Never done   PAP SMEAR-Modifier  Never done   COLONOSCOPY (Pts 45-73yrs Insurance coverage will need to be confirmed)  Never done   MAMMOGRAM  01/11/2014   INFLUENZA VACCINE  10/07/2020   DEXA SCAN  Never done    Colorectal cancer screening: No longer required.  Patient declined  Mammogram status: No longer required due to Patient declined.  Bone density status: patient declined  Lung Cancer Screening: (Low Dose CT Chest recommended if Age 25-80 years, 30 pack-year currently smoking OR have quit w/in 15years.) does not qualify.   Lung Cancer Screening  Referral: no  Additional Screening:  Hepatitis C Screening: does qualify; Completed no  Vision Screening: Recommended annual ophthalmology exams for early detection of glaucoma and other disorders of the eye. Is the patient up to date with their annual eye exam?  No  Who is the provider or what is the name of the office in which the patient attends annual eye exams? Patient declined If pt is not established with a provider, would they like to be referred to a provider to establish care? No .   Dental Screening: Recommended annual dental exams for proper oral hygiene  Community Resource Referral / Chronic Care Management: CRR required this visit?  No   CCM required this visit?  No      Plan:     I have personally reviewed and noted the following in the patient's chart:   Medical and social history Use of alcohol, tobacco or illicit drugs  Current medications and supplements including opioid prescriptions.  Functional ability and status Nutritional status Physical activity Advanced directives List of other physicians Hospitalizations, surgeries, and ER visits in previous 12 months Vitals Screenings to include cognitive, depression, and falls Referrals and appointments  In addition, I have reviewed and discussed with patient certain preventive protocols, quality metrics, and best practice recommendations. A written personalized care plan for preventive services as well as general preventive health recommendations were provided to patient.     Mickeal Needy, LPN   [id number]   Nurse Notes:  Patient is cogitatively intact. There were no vitals filed for this visit. There is no height or weight on file to calculate BMI.   Medical screening examination/treatment/procedure(s) were performed by non-physician practitioner and as supervising physician I was immediately available for consultation/collaboration.  I agree with above. Jacinta Shoe, MD

## 2021-01-19 ENCOUNTER — Other Ambulatory Visit: Payer: Self-pay | Admitting: Emergency Medicine

## 2021-01-19 DIAGNOSIS — F32A Depression, unspecified: Secondary | ICD-10-CM

## 2021-01-19 DIAGNOSIS — F419 Anxiety disorder, unspecified: Secondary | ICD-10-CM

## 2021-01-22 ENCOUNTER — Other Ambulatory Visit: Payer: Self-pay

## 2021-01-22 ENCOUNTER — Ambulatory Visit (INDEPENDENT_AMBULATORY_CARE_PROVIDER_SITE_OTHER): Payer: 59 | Admitting: Emergency Medicine

## 2021-01-22 ENCOUNTER — Telehealth: Payer: Self-pay | Admitting: Emergency Medicine

## 2021-01-22 ENCOUNTER — Encounter: Payer: Self-pay | Admitting: Emergency Medicine

## 2021-01-22 VITALS — BP 130/70 | HR 95 | Ht 60.0 in | Wt 123.0 lb

## 2021-01-22 DIAGNOSIS — B9789 Other viral agents as the cause of diseases classified elsewhere: Secondary | ICD-10-CM | POA: Diagnosis not present

## 2021-01-22 DIAGNOSIS — R051 Acute cough: Secondary | ICD-10-CM | POA: Diagnosis not present

## 2021-01-22 DIAGNOSIS — R0781 Pleurodynia: Secondary | ICD-10-CM

## 2021-01-22 DIAGNOSIS — J988 Other specified respiratory disorders: Secondary | ICD-10-CM | POA: Diagnosis not present

## 2021-01-22 DIAGNOSIS — F418 Other specified anxiety disorders: Secondary | ICD-10-CM | POA: Diagnosis not present

## 2021-01-22 DIAGNOSIS — F419 Anxiety disorder, unspecified: Secondary | ICD-10-CM

## 2021-01-22 DIAGNOSIS — F32A Depression, unspecified: Secondary | ICD-10-CM

## 2021-01-22 LAB — CBC WITH DIFFERENTIAL/PLATELET
Basophils Absolute: 0 10*3/uL (ref 0.0–0.1)
Basophils Relative: 0.4 % (ref 0.0–3.0)
Eosinophils Absolute: 0 10*3/uL (ref 0.0–0.7)
Eosinophils Relative: 0.4 % (ref 0.0–5.0)
HCT: 40.3 % (ref 36.0–46.0)
Hemoglobin: 13.3 g/dL (ref 12.0–15.0)
Lymphocytes Relative: 20.1 % (ref 12.0–46.0)
Lymphs Abs: 1.3 10*3/uL (ref 0.7–4.0)
MCHC: 33 g/dL (ref 30.0–36.0)
MCV: 87.6 fl (ref 78.0–100.0)
Monocytes Absolute: 0.6 10*3/uL (ref 0.1–1.0)
Monocytes Relative: 8.9 % (ref 3.0–12.0)
Neutro Abs: 4.6 10*3/uL (ref 1.4–7.7)
Neutrophils Relative %: 70.2 % (ref 43.0–77.0)
Platelets: 266 10*3/uL (ref 150.0–400.0)
RBC: 4.6 Mil/uL (ref 3.87–5.11)
RDW: 13.2 % (ref 11.5–15.5)
WBC: 6.5 10*3/uL (ref 4.0–10.5)

## 2021-01-22 LAB — COMPREHENSIVE METABOLIC PANEL
ALT: 15 U/L (ref 0–35)
AST: 17 U/L (ref 0–37)
Albumin: 4.2 g/dL (ref 3.5–5.2)
Alkaline Phosphatase: 70 U/L (ref 39–117)
BUN: 19 mg/dL (ref 6–23)
CO2: 28 mEq/L (ref 19–32)
Calcium: 9.6 mg/dL (ref 8.4–10.5)
Chloride: 103 mEq/L (ref 96–112)
Creatinine, Ser: 0.76 mg/dL (ref 0.40–1.20)
GFR: 82.39 mL/min (ref >60.00)
Glucose, Bld: 100 mg/dL — ABNORMAL HIGH (ref 70–99)
Potassium: 4.4 mEq/L (ref 3.5–5.1)
Sodium: 138 mEq/L (ref 135–145)
Total Bilirubin: 0.3 mg/dL (ref 0.2–1.2)
Total Protein: 7.2 g/dL (ref 6.0–8.3)

## 2021-01-22 LAB — HEMOGLOBIN A1C: Hgb A1c MFr Bld: 5.8 % (ref 4.6–6.5)

## 2021-01-22 LAB — LIPID PANEL
Cholesterol: 225 mg/dL — ABNORMAL HIGH (ref 0–200)
HDL: 77.6 mg/dL (ref >39.00)
LDL Cholesterol: 124 mg/dL — ABNORMAL HIGH (ref 0–99)
NonHDL: 147.55
Total CHOL/HDL Ratio: 3
Triglycerides: 117 mg/dL (ref 0.0–149.0)
VLDL: 23.4 mg/dL (ref 0.0–40.0)

## 2021-01-22 LAB — TSH: TSH: 1.02 u[IU]/mL (ref 0.35–5.50)

## 2021-01-22 MED ORDER — ALPRAZOLAM 0.5 MG PO TABS: 0.5000 mg | ORAL_TABLET | Freq: Two times a day (BID) | ORAL | 1 refills | Status: DC | PRN

## 2021-01-22 NOTE — Patient Instructions (Signed)
Viral Illness, Adult ?Viruses are tiny germs that can get into a person's body and cause illness. There are many different types of viruses, and they cause many types of illness. Viral illnesses can range from mild to severe. They can affect various parts of the body. ?Short-term conditions that are caused by a virus include colds and the flu (influenza). Long-term conditions that are caused by a virus include herpes, shingles, and HIV (human immunodeficiency virus) infection. A few viruses have been linked to certain cancers. ?What are the causes? ?Many types of viruses can cause illness. Viruses invade cells in your body, multiply, and cause the infected cells to work abnormally or die. When these cells die, they release more of the virus. When this happens, you develop symptoms of the illness, and the virus continues to spread to other cells. If the virus takes over the function of the cell, it can cause the cell to divide and grow out of control. This happens when a virus causes cancer. ?Different viruses get into the body in different ways. You can get a virus by: ?Swallowing food or water that has come in contact with the virus (is contaminated). ?Breathing in droplets that have been coughed or sneezed into the air by an infected person. ?Touching a surface that has been contaminated with the virus and then touching your eyes, nose, or mouth. ?Being bitten by an insect or animal that carries the virus. ?Having sexual contact with a person who is infected with the virus. ?Being exposed to blood or fluids that contain the virus, either through an open cut or during a transfusion. ?If a virus enters your body, your body's defense system (immune system) will try to fight the virus. You may be at higher risk for a viral illness if your immune system is weak. ?What are the signs or symptoms? ?You may have these symptoms, depending on the type of virus and the location of the cells that it invades: ?Cold and flu  viruses: ?Fever. ?Headache. ?Sore throat. ?Muscle aches. ?Stuffy nose (nasal congestion). ?Cough. ?Digestive system (gastrointestinal) viruses: ?Fever. ?Pain in the abdomen. ?Nausea. ?Diarrhea. ?Liver viruses (hepatitis): ?Loss of appetite. ?Tiredness. ?Skin or the white parts of your eyes turning yellow (jaundice). ?Brain and spinal cord viruses: ?Fever. ?Headache. ?Stiff neck. ?Nausea and vomiting. ?Confusion or sleepiness. ?Skin viruses: ?Warts. ?Itching. ?Rash. ?Sexually transmitted viruses: ?Discharge. ?Swelling. ?Redness. ?Rash. ?How is this diagnosed? ?This condition may be diagnosed based on one or more of the following: ?Symptoms. ?Medical history. ?Physical exam. ?Blood test, sample of mucus from your lungs (sputum sample), stool sample, or a swab of body fluids or a skin sore (lesion). ?How is this treated? ?Viruses can be hard to treat because they live within cells. Antibiotic medicines do not treat viruses because these medicines do not get inside cells. Treatment for a viral illness may include: ?Resting and drinking plenty of fluids. ?Medicines to relieve symptoms. These can include over-the-counter medicine for pain and fever, medicines for cough or congestion, and medicines to relieve diarrhea. ?Antiviral medicines. These medicines are available only for certain types of viruses. ?Some viral illnesses can be prevented with vaccinations. A common example is the flu shot. ?Follow these instructions at home: ?Medicines ?Take over-the-counter and prescription medicines only as told by your health care provider. ?If you were prescribed an antiviral medicine, take it as told by your health care provider. Do not stop taking the antiviral even if you start to feel better. ?Be aware of when antibiotics are   needed and when they are not needed. Antibiotics do not treat viruses. You may get an antibiotic if your health care provider thinks that you may have, or are at risk for, a bacterial infection and you  have a viral infection. ?Do not ask for an antibiotic prescription if you have been diagnosed with a viral illness. Antibiotics will not make your illness go away faster. ?Frequently taking antibiotics when they are not needed can lead to antibiotic resistance. When this develops, the medicine no longer works against the bacteria that it normally fights. ?General instructions ? ?Drink enough fluids to keep your urine pale yellow. ?Rest as much as possible. ?Return to your normal activities as told by your health care provider. Ask your health care provider what activities are safe for you. ?Keep all follow-up visits as told by your health care provider. This is important. ?How is this prevented? ?To reduce your risk of viral illness: ?Wash your hands often with soap and water for at least 20 seconds. If soap and water are not available, use hand sanitizer. ?Avoid touching your nose, eyes, and mouth, especially if you have not washed your hands recently. ?If anyone in your household has a viral infection, clean all household surfaces that may have been in contact with the virus. Use soap and hot water. You may also use bleach that you have added water to (diluted). ?Stay away from people who are sick with symptoms of a viral infection. ?Do not share items such as toothbrushes and water bottles with other people. ?Keep your vaccinations up to date. This includes getting a yearly flu shot. ?Eat a healthy diet and get plenty of rest. ?Contact a health care provider if: ?You have symptoms of a viral illness that do not go away. ?Your symptoms come back after going away. ?Your symptoms get worse. ?Get help right away if you have: ?Trouble breathing. ?A severe headache or a stiff neck. ?Severe vomiting or pain in your abdomen. ?These symptoms may represent a serious problem that is an emergency. Do not wait to see if the symptoms will go away. Get medical help right away. Call your local emergency services (911 in the  [place] Do not drive yourself to the hospital. ?Summary ?Viruses are types of germs that can get into a person's body and cause illness. Viral illnesses can range from mild to severe. They can affect various parts of the body. ?Viruses can be hard to treat. There are medicines to relieve symptoms, and there are some antiviral medicines. ?If you were prescribed an antiviral medicine, take it as told by your health care provider. Do not stop taking the antiviral even if you start to feel better. ?Contact a health care provider if you have symptoms of a viral illness that do not go away. ?This information is not intended to replace advice given to you by your health care provider. Make sure you discuss any questions you have with your health care provider. ?Document Revised: 07/10/2019 Document Reviewed: 01/03/2019 ?Elsevier Patient Education ? 2022 [employer] ? ?

## 2021-01-22 NOTE — Telephone Encounter (Signed)
Patient wants to inform provider that she was unable to have her chest xray done today due to the xray tech not being in   Patient states she was told to come back tomorrow to have xray done

## 2021-01-22 NOTE — Progress Notes (Signed)
Katie Gardner 65 y.o.   Chief Complaint  Patient presents with   Back Pain    X 2 days    HISTORY OF PRESENT ILLNESS: This is a 65 y.o. female complaining of dry cough along with pleuritic rib cage pain that started about 1 week ago. Denies difficulty breathing.  Denies fever or chills.  Denies nausea or vomiting. Pain is sharp, steady, front and back of rib cage.  Worse when coughing. No other associated symptoms. No other complaints or medical concerns today.  Back Pain Associated symptoms include chest pain (Pleuritic and diffuse). Pertinent negatives include no abdominal pain, dysuria, fever or headaches.    [name]   Medication Sig Start Date End Date Taking? Authorizing Provider  ALPRAZolam Duanne Moron) 0.5 MG tablet TAKE 1 TABLET(0.5 MG) BY MOUTH DAILY AS NEEDED FOR ANXIETY 11/25/20  Yes Lashane Whelpley, Ines Bloomer, MD  conjugated estrogens (PREMARIN) vaginal cream Place 1 Applicatorful vaginally daily. 07/19/19  Yes Kamaiya Antilla, Ines Bloomer, MD  cyclobenzaprine (FLEXERIL) 5 MG tablet TAKE 1 TABLET(5 MG) BY MOUTH THREE TIMES DAILY AS NEEDED FOR MUSCLE SPASMS 11/25/20  Yes Horald Pollen, MD  FLUoxetine (PROZAC) 20 MG capsule TAKE 1 CAPSULE(20 MG) BY MOUTH DAILY 01/19/21  Yes Talonda Artist, Ines Bloomer, MD  ibuprofen (ADVIL) 600 MG tablet TAKE 1 TABLET(600 MG) BY MOUTH EVERY 8 HOURS AS NEEDED FOR MODERATE PAIN 08/09/20  Yes Vanita Cannell, Ines Bloomer, MD  lisinopril (PRINIVIL,ZESTRIL) 10 MG tablet Take 1 tablet (10 mg total) by mouth daily. 04/22/18  Yes Kaymon Denomme, Ines Bloomer, MD  loratadine (CLARITIN) 10 MG tablet Take 1 tablet (10 mg total) by mouth daily. 09/12/18  Yes Golden Gilreath, Ines Bloomer, MD  rosuvastatin (CRESTOR) 10 MG tablet TAKE 1 TABLET(10 MG) BY MOUTH DAILY 06/10/20  Yes Ashton Belote, Ines Bloomer, MD  sulfamethoxazole-trimethoprim (BACTRIM DS) 800-160 MG tablet Take 1 tablet by mouth 2 (two) times daily. 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   Dyspareunia in female 09/13/2019   Skin tags, multiple acquired 07/27/2019   Chronic insomnia 05/31/2019   Essential hypertension 12/17/2017   Seasonal allergies 12/17/2017   Anxiety and depression 11/13/2016   Chronic anxiety 11/13/2016   Chronic sore throat 11/13/2016   Hot flashes    Situational anxiety    Dyslipidemia     Past Medical History:  Diagnosis Date   Allergy    seasonal   Anxiety    Depression    Hematuria    Hot flashes    Hx of cardiovascular stress test    Lex MV 12/13:  EF 68%, mild apical thinning, no ischemia   Hyperlipidemia     Past Surgical History:  Procedure Laterality Date   BREAST LUMPECTOMY     right breast, not cancer   Carpel tunnel     ECTOPIC PREGNANCY SURGERY      Social History   Socioeconomic History   Marital status: Legally Separated    Spouse name: Not on file   Number of children: 1   Years of education: 8th grade   Highest education level: Not  on file  Occupational History    Employer: UNEMPLOYED  Tobacco Use   Smoking status: Former   Smokeless tobacco: Never  Scientific laboratory technician Use: Never used  Substance and Sexual Activity   Alcohol use: No   Drug use: No   Sexual activity: Yes  Other Topics Concern   Not on file  Social History Narrative   Not on file   Social Determinants of Health   Financial Resource Strain: Low Risk    Difficulty of Paying Living Expenses: Not hard at all  Food Insecurity: No Food Insecurity   Worried About Charity fundraiser in the Last Year: Never true   [place] in the Last Year: Never true   Transportation Needs: No Transportation Needs   Lack of Transportation (Medical): No   Lack of Transportation (Non-Medical): No  Physical Activity: Inactive   Days of Exercise per Week: 0 days   Minutes of Exercise per Session: 0 min  Stress: No Stress Concern Present   Feeling of Stress : Not at all  Social Connections: Socially Isolated   Frequency of Communication with Friends and Family: Once a week   Frequency of Social Gatherings with Friends and Family: Once a week   Attends Religious Services: Never   Marine scientist or Organizations: No   Attends Music therapist: Never   Marital Status: Never married  Human resources officer Violence: Not At Risk   Fear of Current or Ex-Partner: No   Emotionally Abused: No   Physically Abused: No   Sexually Abused: No    Family History  Problem Relation Age of Onset   Heart disease Mother    Diabetes Mother    Esophageal cancer Father        40's   Colon cancer Neg Hx      Review of Systems  Constitutional: Negative.  Negative for chills and fever.  HENT: Negative.  Negative for congestion and sore throat.   Respiratory:  Positive for cough. Negative for shortness of breath.   Cardiovascular:  Positive for chest pain (Pleuritic and diffuse). Negative for palpitations.  Gastrointestinal:  Negative for abdominal pain, diarrhea, nausea and vomiting.  Genitourinary: Negative.  Negative for dysuria.  Musculoskeletal:  Positive for back pain.  Skin: Negative.  Negative for rash.  Neurological:  Negative for dizziness and headaches.  All other systems reviewed and are negative.  Today's Vitals   01/22/21 1000  BP: 140/78  Pulse: 95  SpO2: 94%  Weight: 123 lb (55.8 kg)  Height: 5' (1.524 m)   Body mass index is 24.02 kg/m.  Physical Exam Vitals reviewed.  Constitutional:      Appearance: Normal appearance.  HENT:     Head: Normocephalic.  Eyes:     Extraocular Movements: Extraocular movements intact.      Pupils: Pupils are equal, round, and reactive to light.  Cardiovascular:     Rate and Rhythm: Normal rate and regular rhythm.     Pulses: Normal pulses.     Heart sounds: Normal heart sounds.  Pulmonary:     Effort: Pulmonary effort is normal.     Breath sounds: Normal breath sounds.  Musculoskeletal:     Cervical back: Normal range of motion and neck supple.  Skin:    General: Skin is warm and dry.     Capillary Refill: Capillary refill takes less than 2 seconds.  Neurological:     General: No focal deficit present.  Mental Status: She is alert and oriented to person, place, and time.  Psychiatric:        Mood and Affect: Mood normal.        Behavior: Behavior normal.     ASSESSMENT & PLAN: Clinically stable.  No red flag signs or symptoms.  Stable vital signs. Most likely viral respiratory infection running its course.  Problem List Items Addressed This Visit       Other   Situational anxiety   Relevant Medications   ALPRAZolam (XANAX) 0.5 MG tablet   Anxiety and depression   Relevant Medications   ALPRAZolam (XANAX) 0.5 MG tablet   Other Visit Diagnoses     Acute cough    -  Primary   Relevant Orders   DG Chest 2 View   Viral respiratory illness       Relevant Orders   CBC with Differential/Platelet   Comprehensive metabolic panel   Hemoglobin A1c   Lipid panel   TSH   DG Chest 2 View   Pleuritic chest pain       Relevant Orders   CBC with Differential/Platelet   Comprehensive metabolic panel   Hemoglobin A1c   Lipid panel   TSH      Patient Instructions  Viral Illness, Adult Viruses are tiny germs that can get into a person's body and cause illness. There are many different types of viruses, and they cause many types of illness. Viral illnesses can range from mild to severe. They can affect various parts of the body. Short-term conditions that are caused by a virus include colds and the flu (influenza). Long-term conditions that are caused by a  virus include herpes, shingles, and HIV (human immunodeficiency virus) infection. A few viruses have been linked to certain cancers. What are the causes? Many types of viruses can cause illness. Viruses invade cells in your body, multiply, and cause the infected cells to work abnormally or die. When these cells die, they release more of the virus. When this happens, you develop symptoms of the illness, and the virus continues to spread to other cells. If the virus takes over the function of the cell, it can cause the cell to divide and grow out of control. This happens when a virus causes cancer. Different viruses get into the body in different ways. You can get a virus by: Swallowing food or water that has come in contact with the virus (is contaminated). Breathing in droplets that have been coughed or sneezed into the air by an infected person. Touching a surface that has been contaminated with the virus and then touching your eyes, nose, or mouth. Being bitten by an insect or animal that carries the virus. Having sexual contact with a person who is infected with the virus. Being exposed to blood or fluids that contain the virus, either through an open cut or during a transfusion. If a virus enters your body, your body's defense system (immune system) will try to fight the virus. You may be at higher risk for a viral illness if your immune system is weak. What are the signs or symptoms? You may have these symptoms, depending on the type of virus and the location of the cells that it invades: Cold and flu viruses: Fever. Headache. Sore throat. Muscle aches. Stuffy nose (nasal congestion). Cough. Digestive system (gastrointestinal) viruses: Fever. Pain in the abdomen. Nausea. Diarrhea. Liver viruses (hepatitis): Loss of appetite. Tiredness. Skin or the white parts of your eyes turning yellow (jaundice).  Brain and spinal cord viruses: Fever. Headache. Stiff neck. Nausea and  vomiting. Confusion or sleepiness. Skin viruses: Warts. Itching. Rash. Sexually transmitted viruses: Discharge. Swelling. Redness. Rash. How is this diagnosed? This condition may be diagnosed based on one or more of the following: Symptoms. Medical history. Physical exam. Blood test, sample of mucus from your lungs (sputum sample), stool sample, or a swab of body fluids or a skin sore (lesion). How is this treated? Viruses can be hard to treat because they live within cells. Antibiotic medicines do not treat viruses because these medicines do not get inside cells. Treatment for a viral illness may include: Resting and drinking plenty of fluids. Medicines to relieve symptoms. These can include over-the-counter medicine for pain and fever, medicines for cough or congestion, and medicines to relieve diarrhea. Antiviral medicines. These medicines are available only for certain types of viruses. Some viral illnesses can be prevented with vaccinations. A common example is the flu shot. Follow these instructions at home: Medicines Take over-the-counter and prescription medicines only as told by your health care provider. If you were prescribed an antiviral medicine, take it as told by your health care provider. Do not stop taking the antiviral even if you start to feel better. Be aware of when antibiotics are needed and when they are not needed. Antibiotics do not treat viruses. You may get an antibiotic if your health care provider thinks that you may have, or are at risk for, a bacterial infection and you have a viral infection. Do not ask for an antibiotic prescription if you have been diagnosed with a viral illness. Antibiotics will not make your illness go away faster. Frequently taking antibiotics when they are not needed can lead to antibiotic resistance. When this develops, the medicine no longer works against the bacteria that it normally fights. General instructions  Drink enough  fluids to keep your urine pale yellow. Rest as much as possible. Return to your normal activities as told by your health care provider. Ask your health care provider what activities are safe for you. Keep all follow-up visits as told by your health care provider. This is important. How is this prevented? To reduce your risk of viral illness: Wash your hands often with soap and water for at least 20 seconds. If soap and water are not available, use hand sanitizer. Avoid touching your nose, eyes, and mouth, especially if you have not washed your hands recently. If anyone in your household has a viral infection, clean all household surfaces that may have been in contact with the virus. Use soap and hot water. You may also use bleach that you have added water to (diluted). Stay away from people who are sick with symptoms of a viral infection. Do not share items such as toothbrushes and water bottles with other people. Keep your vaccinations up to date. This includes getting a yearly flu shot. Eat a healthy diet and get plenty of rest. Contact a health care provider if: You have symptoms of a viral illness that do not go away. Your symptoms come back after going away. Your symptoms get worse. Get help right away if you have: Trouble breathing. A severe headache or a stiff neck. Severe vomiting or pain in your abdomen. These symptoms may represent a serious problem that is an emergency. Do not wait to see if the symptoms will go away. Get medical help right away. Call your local emergency services (911 in the [place] Do not drive yourself to the hospital.  Summary Viruses are types of germs that can get into a person's body and cause illness. Viral illnesses can range from mild to severe. They can affect various parts of the body. Viruses can be hard to treat. There are medicines to relieve symptoms, and there are some antiviral medicines. If you were prescribed an antiviral medicine, take it as  told by your health care provider. Do not stop taking the antiviral even if you start to feel better. Contact a health care provider if you have symptoms of a viral illness that do not go away. This information is not intended to replace advice given to you by your health care provider. Make sure you discuss any questions you have with your health care provider. Document Revised: 07/10/2019 Document Reviewed: 01/03/2019 Elsevier Patient Education  2022 [place], MD [facility]

## 2021-01-28 ENCOUNTER — Ambulatory Visit: Payer: 59 | Admitting: Emergency Medicine

## 2021-01-31 ENCOUNTER — Other Ambulatory Visit: Payer: Self-pay | Admitting: Emergency Medicine

## 2021-01-31 DIAGNOSIS — J302 Other seasonal allergic rhinitis: Secondary | ICD-10-CM

## 2021-02-03 ENCOUNTER — Telehealth: Payer: Self-pay | Admitting: Emergency Medicine

## 2021-02-03 NOTE — Telephone Encounter (Signed)
[facility]cted to [facility] 11.28.2022.    Caller states she is complaining of chest pains that comes and goes. Caller states the pain can be there all day. Caller states this started last week. Caller states she has not been tested for COVID or FLU. Caller states her back, lungs, and ribs hurt as well. Caller denies any other symptoms.   Advised to call 911 EMS

## 2021-02-03 NOTE — Telephone Encounter (Signed)
Pt. [facility]cted [facility] 11.27.2022.   Caller states she is having chest pain. Symptoms started a week ago. States pain radiates to left arm. States pain is located in left chest. States has beencoughing. Rates 1 on 0-10 scale. States has not taken blood pressure med in 2 days. Does not know the name of the BP meds.   Advised to call EMS 911

## 2021-02-04 NOTE — Telephone Encounter (Signed)
Called and spoke with pt, made an OV for 02/10/21 to f/u on cough and chest pain.

## 2021-02-10 ENCOUNTER — Encounter: Payer: Self-pay | Admitting: Emergency Medicine

## 2021-02-10 ENCOUNTER — Ambulatory Visit (INDEPENDENT_AMBULATORY_CARE_PROVIDER_SITE_OTHER): Payer: 59 | Admitting: Emergency Medicine

## 2021-02-10 ENCOUNTER — Ambulatory Visit (INDEPENDENT_AMBULATORY_CARE_PROVIDER_SITE_OTHER): Payer: 59

## 2021-02-10 ENCOUNTER — Other Ambulatory Visit: Payer: Self-pay

## 2021-02-10 VITALS — BP 120/68 | HR 68 | Ht 65.0 in | Wt 127.0 lb

## 2021-02-10 DIAGNOSIS — B9789 Other viral agents as the cause of diseases classified elsewhere: Secondary | ICD-10-CM

## 2021-02-10 DIAGNOSIS — Z1231 Encounter for screening mammogram for malignant neoplasm of breast: Secondary | ICD-10-CM

## 2021-02-10 DIAGNOSIS — Q188 Other specified congenital malformations of face and neck: Secondary | ICD-10-CM | POA: Diagnosis not present

## 2021-02-10 DIAGNOSIS — I10 Essential (primary) hypertension: Secondary | ICD-10-CM | POA: Diagnosis not present

## 2021-02-10 DIAGNOSIS — R051 Acute cough: Secondary | ICD-10-CM

## 2021-02-10 DIAGNOSIS — J988 Other specified respiratory disorders: Secondary | ICD-10-CM

## 2021-02-10 DIAGNOSIS — Z1211 Encounter for screening for malignant neoplasm of colon: Secondary | ICD-10-CM

## 2021-02-10 MED ORDER — LISINOPRIL 10 MG PO TABS: 10.0000 mg | ORAL_TABLET | Freq: Every day | ORAL | 3 refills | Status: DC

## 2021-02-10 NOTE — Patient Instructions (Signed)
Health Maintenance After Age 65 After age 65, you are at a higher risk for certain long-term diseases and infections as well as injuries from falls. Falls are a major cause of broken bones and head injuries in people who are older than age 65. Getting regular preventive care can help to keep you healthy and well. Preventive care includes getting regular testing and making lifestyle changes as recommended by your health care provider. Talk with your health care provider about: Which screenings and tests you should have. A screening is a test that checks for a disease when you have no symptoms. A diet and exercise plan that is right for you. What should I know about screenings and tests to prevent falls? Screening and testing are the best ways to find a health problem early. Early diagnosis and treatment give you the best chance of managing medical conditions that are common after age 65. Certain conditions and lifestyle choices may make you more likely to have a fall. Your health care provider may recommend: Regular vision checks. Poor vision and conditions such as cataracts can make you more likely to have a fall. If you wear glasses, make sure to get your prescription updated if your vision changes. Medicine review. Work with your health care provider to regularly review all of the medicines you are taking, including over-the-counter medicines. Ask your health care provider about any side effects that may make you more likely to have a fall. Tell your health care provider if any medicines that you take make you feel dizzy or sleepy. Strength and balance checks. Your health care provider may recommend certain tests to check your strength and balance while standing, walking, or changing positions. Foot health exam. Foot pain and numbness, as well as not wearing proper footwear, can make you more likely to have a fall. Screenings, including: Osteoporosis screening. Osteoporosis is a condition that causes  the bones to get weaker and break more easily. Blood pressure screening. Blood pressure changes and medicines to control blood pressure can make you feel dizzy. Depression screening. You may be more likely to have a fall if you have a fear of falling, feel depressed, or feel unable to do activities that you used to do. Alcohol use screening. Using too much alcohol can affect your balance and may make you more likely to have a fall. Follow these instructions at home: Lifestyle Do not drink alcohol if: Your health care provider tells you not to drink. If you drink alcohol: Limit how much you have to: 0-1 drink a day for women. 0-2 drinks a day for men. Know how much alcohol is in your drink. In the [place] one drink equals one 12 oz bottle of beer (355 mL), one 5 oz glass of wine (148 mL), or one 1 oz glass of hard liquor (44 mL). Do not use any products that contain nicotine or tobacco. These products include cigarettes, chewing tobacco, and vaping devices, such as e-cigarettes. If you need help quitting, ask your health care provider. Activity  Follow a regular exercise program to stay fit. This will help you maintain your balance. Ask your health care provider what types of exercise are appropriate for you. If you need a cane or walker, use it as recommended by your health care provider. Wear supportive shoes that have nonskid soles. Safety  Remove any tripping hazards, such as rugs, cords, and clutter. Install safety equipment such as grab bars in bathrooms and safety rails on stairs. Keep rooms and walkways   well-lit. General instructions Talk with your health care provider about your risks for falling. Tell your health care provider if: You fall. Be sure to tell your health care provider about all falls, even ones that seem minor. You feel dizzy, tiredness (fatigue), or off-balance. Take over-the-counter and prescription medicines only as told by your health care provider. These include  supplements. Eat a healthy diet and maintain a healthy weight. A healthy diet includes low-fat dairy products, low-fat (lean) meats, and fiber from whole grains, beans, and lots of fruits and vegetables. Stay current with your vaccines. Schedule regular health, dental, and eye exams. Summary Having a healthy lifestyle and getting preventive care can help to protect your health and wellness after age 65. Screening and testing are the best way to find a health problem early and help you avoid having a fall. Early diagnosis and treatment give you the best chance for managing medical conditions that are more common for people who are older than age 65. Falls are a major cause of broken bones and head injuries in people who are older than age 65. Take precautions to prevent a fall at home. Work with your health care provider to learn what changes you can make to improve your health and wellness and to prevent falls. This information is not intended to replace advice given to you by your health care provider. Make sure you discuss any questions you have with your health care provider. Document Revised: 07/15/2020 Document Reviewed: 07/15/2020 Elsevier Patient Education  2022 [employer]  

## 2021-02-10 NOTE — Progress Notes (Signed)
Katie Gardner 65 y.o.   Chief Complaint  Patient presents with   Cough    Pt states feels better, gets SOB at times    HISTORY OF PRESENT ILLNESS: This is a 65 y.o. female here for follow-up of respiratory viral infection.  Much improved.  No longer coughing.  Still has some residual dyspnea on exertion.  No recent smoking history. Also complaining of eyebrows not growing back.  No history of laser surgery.  Requesting dermatology referral. No other complaints or medical concerns today.  Cough Associated symptoms include shortness of breath (Dyspnea on exertion). Pertinent negatives include no chest pain, chills, fever, headaches or sore throat.    [name]   Medication Sig Start Date End Date Taking? Authorizing Provider  ALPRAZolam Duanne Moron) 0.5 MG tablet Take 1 tablet (0.5 mg total) by mouth 2 (two) times daily as needed for anxiety. 01/22/21  Yes Inella Kuwahara, Ines Bloomer, MD  conjugated estrogens (PREMARIN) vaginal cream Place 1 Applicatorful vaginally daily. 07/19/19  Yes Amilya Haver, Ines Bloomer, MD  cyclobenzaprine (FLEXERIL) 5 MG tablet TAKE 1 TABLET(5 MG) BY MOUTH THREE TIMES DAILY AS NEEDED FOR MUSCLE SPASMS 11/25/20  Yes Horald Pollen, MD  FLUoxetine (PROZAC) 20 MG capsule TAKE 1 CAPSULE(20 MG) BY MOUTH DAILY 01/19/21  Yes Trigg Delarocha, Ines Bloomer, MD  ibuprofen (ADVIL) 600 MG tablet TAKE 1 TABLET(600 MG) BY MOUTH EVERY 8 HOURS AS NEEDED FOR MODERATE PAIN 08/09/20  Yes Kyliana Standen, Ines Bloomer, MD  loratadine (CLARITIN) 10 MG tablet Take 1 tablet (10 mg total) by mouth daily. 09/12/18  Yes Hansika Leaming, Ines Bloomer, MD  rosuvastatin (CRESTOR) 10 MG tablet TAKE 1 TABLET(10 MG) BY MOUTH DAILY 06/10/20  Yes Alyssa Mancera, Ines Bloomer, MD  sulfamethoxazole-trimethoprim (BACTRIM DS) 800-160 MG tablet Take 1 tablet by mouth 2 (two) times daily. 03/12/20  Yes Maximiano Coss, NP  lisinopril (ZESTRIL) 10 MG tablet Take 1 tablet (10 mg total) by mouth daily. 02/10/21   Horald Pollen, MD    Allergies  Allergen Reactions   Macrobid [Nitrofurantoin] Nausea And Vomiting   Hydrocodone Nausea And Vomiting    dizzy   Mucinex Dm [Dm-Guaifenesin Er] Swelling    Sob, throat closing    Patient Active Problem List   Diagnosis Date Noted   Hematoma of left flank 12/09/2020   Arm pain, musculoskeletal, left 07/17/2020   Chronic musculoskeletal pain 09/13/2019   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   Dyspareunia in female 09/13/2019   Skin tags, multiple acquired 07/27/2019   Chronic insomnia 05/31/2019   Essential hypertension 12/17/2017   Seasonal allergies 12/17/2017   Anxiety and depression 11/13/2016   Chronic anxiety 11/13/2016   Chronic sore throat 11/13/2016   Hot flashes    Situational anxiety    Dyslipidemia     Past Medical History:  Diagnosis Date   Allergy    seasonal   Anxiety    Depression    Hematuria    Hot flashes    Hx of cardiovascular stress test    Lex MV 12/13:  EF 68%, mild apical thinning, no ischemia   Hyperlipidemia     Past Surgical History:  Procedure Laterality Date   BREAST LUMPECTOMY     right breast, not cancer   Carpel tunnel     ECTOPIC PREGNANCY SURGERY      Social History   Socioeconomic History   Marital status: Legally Separated    Spouse name: Not on file   Number of children: 1   Years of education:  8th grade   Highest education level: Not on file  Occupational History    Employer: UNEMPLOYED  Tobacco Use   Smoking status: Former   Smokeless tobacco: Never  Vaping Use   Vaping Use: Never used  Substance and Sexual Activity   Alcohol use: No   Drug use: No   Sexual activity: Yes  Other Topics Concern   Not on file  Social History Narrative   Not on file   Social Determinants of Health   Financial Resource Strain: Low Risk    Difficulty of Paying Living Expenses: Not hard at all  Food Insecurity: No Food Insecurity   Worried About Programme researcher, broadcasting/film/video in the Last Year: Never  true   Ran Out of Food in the Last Year: Never true  Transportation Needs: No Transportation Needs   Lack of Transportation (Medical): No   Lack of Transportation (Non-Medical): No  Physical Activity: Inactive   Days of Exercise per Week: 0 days   Minutes of Exercise per Session: 0 min  Stress: No Stress Concern Present   Feeling of Stress : Not at all  Social Connections: Socially Isolated   Frequency of Communication with Friends and Family: Once a week   Frequency of Social Gatherings with Friends and Family: Once a week   Attends Religious Services: Never   Database administrator or Organizations: No   Attends Engineer, structural: Never   Marital Status: Never married  Catering manager Violence: Not At Risk   Fear of Current or Ex-Partner: No   Emotionally Abused: No   Physically Abused: No   Sexually Abused: No    Family History  Problem Relation Age of Onset   Heart disease Mother    Diabetes Mother    Esophageal cancer Father        85's   Colon cancer Neg Hx      Review of Systems  Constitutional: Negative.  Negative for chills and fever.  HENT: Negative.  Negative for congestion and sore throat.   Respiratory:  Positive for shortness of breath (Dyspnea on exertion). Negative for cough.   Cardiovascular: Negative.  Negative for chest pain and palpitations.  Gastrointestinal: Negative.  Negative for abdominal pain, diarrhea, nausea and vomiting.  Genitourinary: Negative.   Skin: Negative.        Sparse eyebrows  Neurological: Negative.  Negative for dizziness and headaches.  All other systems reviewed and are negative.  Today's Vitals   02/10/21 1501  BP: 120/68  Pulse: 68  SpO2: 98%  Weight: 127 lb (57.6 kg)  Height: 5\' 5"  (1.651 m)   Body mass index is 21.13 kg/m. BP Readings from Last 3 Encounters:  02/10/21 120/68  01/22/21 130/70  12/09/20 118/60    Physical Exam Vitals reviewed.  Constitutional:      Appearance: Normal  appearance.  HENT:     Head: Normocephalic.  Eyes:     Extraocular Movements: Extraocular movements intact.     Conjunctiva/sclera: Conjunctivae normal.     Pupils: Pupils are equal, round, and reactive to light.  Cardiovascular:     Rate and Rhythm: Normal rate and regular rhythm.     Pulses: Normal pulses.     Heart sounds: Normal heart sounds.  Pulmonary:     Effort: Pulmonary effort is normal.     Breath sounds: Normal breath sounds.  Musculoskeletal:        General: Normal range of motion.     Cervical back: No  tenderness.  Lymphadenopathy:     Cervical: No cervical adenopathy.  Skin:    General: Skin is warm and dry.  Neurological:     General: No focal deficit present.     Mental Status: She is alert and oriented to person, place, and time.   DG Chest 2 View  Result Date: 02/10/2021 CLINICAL DATA:  Cough with pleuritic chest pain EXAM: CHEST - 2 VIEW COMPARISON:  01/15/2017 FINDINGS: The heart size and mediastinal contours are within normal limits. Both lungs are clear. The visualized skeletal structures are unremarkable. IMPRESSION: No active cardiopulmonary disease. Electronically Signed   By: Donavan Foil M.D.   On: 02/10/2021 15:29     ASSESSMENT & PLAN: Problem List Items Addressed This Visit       Cardiovascular and Mediastinum   Essential hypertension   Relevant Medications   lisinopril (ZESTRIL) 10 MG tablet   Other Visit Diagnoses     Sparse eyebrow    -  Primary   Relevant Orders   Ambulatory referral to Dermatology   Viral respiratory infection       Much improved   Encounter for screening mammogram for malignant neoplasm of breast       Relevant Orders   MM Digital Screening   Colon cancer screening       Relevant Orders   Ambulatory referral to Gastroenterology        Patient Instructions  Health Maintenance After Age 93 After age 65, you are at a higher risk for certain long-term diseases and infections as well as injuries from falls.  Falls are a major cause of broken bones and head injuries in people who are older than age 87. Getting regular preventive care can help to keep you healthy and well. Preventive care includes getting regular testing and making lifestyle changes as recommended by your health care provider. Talk with your health care provider about: Which screenings and tests you should have. A screening is a test that checks for a disease when you have no symptoms. A diet and exercise plan that is right for you. What should I know about screenings and tests to prevent falls? Screening and testing are the best ways to find a health problem early. Early diagnosis and treatment give you the best chance of managing medical conditions that are common after age 21. Certain conditions and lifestyle choices may make you more likely to have a fall. Your health care provider may recommend: Regular vision checks. Poor vision and conditions such as cataracts can make you more likely to have a fall. If you wear glasses, make sure to get your prescription updated if your vision changes. Medicine review. Work with your health care provider to regularly review all of the medicines you are taking, including over-the-counter medicines. Ask your health care provider about any side effects that may make you more likely to have a fall. Tell your health care provider if any medicines that you take make you feel dizzy or sleepy. Strength and balance checks. Your health care provider may recommend certain tests to check your strength and balance while standing, walking, or changing positions. Foot health exam. Foot pain and numbness, as well as not wearing proper footwear, can make you more likely to have a fall. Screenings, including: Osteoporosis screening. Osteoporosis is a condition that causes the bones to get weaker and break more easily. Blood pressure screening. Blood pressure changes and medicines to control blood pressure can make you  feel dizzy. Depression screening. You  may be more likely to have a fall if you have a fear of falling, feel depressed, or feel unable to do activities that you used to do. Alcohol use screening. Using too much alcohol can affect your balance and may make you more likely to have a fall. Follow these instructions at home: Lifestyle Do not drink alcohol if: Your health care provider tells you not to drink. If you drink alcohol: Limit how much you have to: 0-1 drink a day for women. 0-2 drinks a day for men. Know how much alcohol is in your drink. In the [place] one drink equals one 12 oz bottle of beer (355 mL), one 5 oz glass of wine (148 mL), or one 1 oz glass of hard liquor (44 mL). Do not use any products that contain nicotine or tobacco. These products include cigarettes, chewing tobacco, and vaping devices, such as e-cigarettes. If you need help quitting, ask your health care provider. Activity  Follow a regular exercise program to stay fit. This will help you maintain your balance. Ask your health care provider what types of exercise are appropriate for you. If you need a cane or walker, use it as recommended by your health care provider. Wear supportive shoes that have nonskid soles. Safety  Remove any tripping hazards, such as rugs, cords, and clutter. Install safety equipment such as grab bars in bathrooms and safety rails on stairs. Keep rooms and walkways well-lit. General instructions Talk with your health care provider about your risks for falling. Tell your health care provider if: You fall. Be sure to tell your health care provider about all falls, even ones that seem minor. You feel dizzy, tiredness (fatigue), or off-balance. Take over-the-counter and prescription medicines only as told by your health care provider. These include supplements. Eat a healthy diet and maintain a healthy weight. A healthy diet includes low-fat dairy products, low-fat (lean) meats, and fiber from  whole grains, beans, and lots of fruits and vegetables. Stay current with your vaccines. Schedule regular health, dental, and eye exams. Summary Having a healthy lifestyle and getting preventive care can help to protect your health and wellness after age 34. Screening and testing are the best way to find a health problem early and help you avoid having a fall. Early diagnosis and treatment give you the best chance for managing medical conditions that are more common for people who are older than age 35. Falls are a major cause of broken bones and head injuries in people who are older than age 64. Take precautions to prevent a fall at home. Work with your health care provider to learn what changes you can make to improve your health and wellness and to prevent falls. This information is not intended to replace advice given to you by your health care provider. Make sure you discuss any questions you have with your health care provider. Document Revised: 07/15/2020 Document Reviewed: 07/15/2020 Elsevier Patient Education  2022 [place], MD [facility]

## 2021-03-18 ENCOUNTER — Other Ambulatory Visit: Payer: Self-pay | Admitting: Emergency Medicine

## 2021-03-18 DIAGNOSIS — F419 Anxiety disorder, unspecified: Secondary | ICD-10-CM

## 2021-03-18 DIAGNOSIS — F418 Other specified anxiety disorders: Secondary | ICD-10-CM

## 2021-03-21 ENCOUNTER — Ambulatory Visit: Payer: 59

## 2021-03-31 ENCOUNTER — Other Ambulatory Visit: Payer: Self-pay | Admitting: Emergency Medicine

## 2021-03-31 DIAGNOSIS — E785 Hyperlipidemia, unspecified: Secondary | ICD-10-CM

## 2021-04-02 ENCOUNTER — Ambulatory Visit (INDEPENDENT_AMBULATORY_CARE_PROVIDER_SITE_OTHER): Payer: 59 | Admitting: Emergency Medicine

## 2021-04-02 ENCOUNTER — Encounter: Payer: Self-pay | Admitting: Emergency Medicine

## 2021-04-02 ENCOUNTER — Other Ambulatory Visit: Payer: Self-pay

## 2021-04-02 VITALS — BP 128/76 | HR 87 | Temp 98.3°F | Ht 65.0 in | Wt 130.0 lb

## 2021-04-02 DIAGNOSIS — M79602 Pain in left arm: Secondary | ICD-10-CM | POA: Diagnosis not present

## 2021-04-02 DIAGNOSIS — E785 Hyperlipidemia, unspecified: Secondary | ICD-10-CM

## 2021-04-02 DIAGNOSIS — I10 Essential (primary) hypertension: Secondary | ICD-10-CM | POA: Diagnosis not present

## 2021-04-02 NOTE — Assessment & Plan Note (Signed)
Stable.  Diet and nutrition discussed. The 10-year ASCVD risk score (Arnett DK, et al., 2019) is: 6.9%   Values used to calculate the score:     Age: 66 years     Sex: Female     Is Non-Hispanic African American: No     Diabetic: No     Tobacco smoker: No     Systolic Blood Pressure: 128 mmHg     Is BP treated: Yes     HDL Cholesterol: 77.6 mg/dL     Total Cholesterol: 225 mg/dL Continue rosuvastatin 10 mg daily.

## 2021-04-02 NOTE — Assessment & Plan Note (Addendum)
Most likely musculoskeletal in nature.  Asymptomatic today.  No red flag signs or symptoms.  Unremarkable exam.  Stable.  No clinical concerns at this time. Differential diagnosis discussed.

## 2021-04-02 NOTE — Progress Notes (Signed)
Katie Gardner 66 y.o.   Chief Complaint  Patient presents with   Wrist Pain    Left side,pt states the pain comes and goes. Also, pain goes up arm    HISTORY OF PRESENT ILLNESS: This is a 66 y.o. female complaining of left forearm pain that happened last week without any injury.  Much better today. Pain was sharp and localized to left forearm with some radiation to the upper arm and not associated with any other symptoms. Denies chest pain or difficulty breathing.  Had no erythema or ecchymosis.  Back to normal today. No other complaints or medical concerns today.  Wrist Pain  Pertinent negatives include no fever.    [name]   Medication Sig Start Date End Date Taking? Authorizing Provider  ALPRAZolam (XANAX) 0.5 MG tablet TAKE 1 TABLET(0.5 MG) BY MOUTH TWICE DAILY AS NEEDED FOR ANXIETY 03/19/21  Yes Ciarra Braddy, Ines Bloomer, MD  conjugated estrogens (PREMARIN) vaginal cream Place 1 Applicatorful vaginally daily. 07/19/19  Yes Nile Dorning, Ines Bloomer, MD  cyclobenzaprine (FLEXERIL) 5 MG tablet TAKE 1 TABLET(5 MG) BY MOUTH THREE TIMES DAILY AS NEEDED FOR MUSCLE SPASMS 11/25/20  Yes Horald Pollen, MD  FLUoxetine (PROZAC) 20 MG capsule TAKE 1 CAPSULE(20 MG) BY MOUTH DAILY 01/19/21  Yes Ramondo Dietze, Ines Bloomer, MD  ibuprofen (ADVIL) 600 MG tablet TAKE 1 TABLET(600 MG) BY MOUTH EVERY 8 HOURS AS NEEDED FOR MODERATE PAIN 08/09/20  Yes Florina Glas, Ines Bloomer, MD  lisinopril (ZESTRIL) 10 MG tablet Take 1 tablet (10 mg total) by mouth daily. 02/10/21  Yes Oliviarose Punch, Ines Bloomer, MD  loratadine (CLARITIN) 10 MG tablet Take 1 tablet (10 mg total) by mouth daily. 09/12/18  Yes Scheryl Sanborn, Ines Bloomer, MD  rosuvastatin (CRESTOR) 10 MG tablet TAKE 1 TABLET(10 MG) BY MOUTH DAILY 04/01/21  Yes Irisa Grimsley, Ines Bloomer, MD  sulfamethoxazole-trimethoprim (BACTRIM DS) 800-160 MG tablet Take 1 tablet by mouth 2 (two) times daily. 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   Dyspareunia in female 09/13/2019   Skin tags, multiple acquired 07/27/2019   Chronic insomnia 05/31/2019   Essential hypertension 12/17/2017   Seasonal allergies 12/17/2017   Anxiety and depression 11/13/2016   Chronic anxiety 11/13/2016   Chronic sore throat 11/13/2016   Hot flashes    Situational anxiety    Dyslipidemia     Past Medical History:  Diagnosis Date   Allergy    seasonal   Anxiety    Depression    Hematuria    Hot flashes    Hx of cardiovascular stress test    Lex MV 12/13:  EF 68%, mild apical thinning, no ischemia   Hyperlipidemia     Past Surgical History:  Procedure Laterality Date   BREAST LUMPECTOMY     right breast, not cancer   Carpel tunnel     ECTOPIC PREGNANCY SURGERY      Social History   Socioeconomic History   Marital status: Legally Separated    Spouse name: Not on file   Number of children: 1   Years of education: 8th  grade   Highest education level: Not on file  Occupational History    Employer: UNEMPLOYED  Tobacco Use   Smoking status: Former   Smokeless tobacco: Never  Vaping Use   Vaping Use: Never used  Substance and Sexual Activity   Alcohol use: No   Drug use: No   Sexual activity: Yes  Other Topics Concern   Not on file  Social History Narrative   Not on file   Social Determinants of Health   Financial Resource Strain: Low Risk    Difficulty of Paying Living Expenses: Not hard at all  Food Insecurity: No Food Insecurity   Worried About Charity fundraiser in the Last Year: Never true   [place] in the Last Year: Never true   Transportation Needs: No Transportation Needs   Lack of Transportation (Medical): No   Lack of Transportation (Non-Medical): No  Physical Activity: Inactive   Days of Exercise per Week: 0 days   Minutes of Exercise per Session: 0 min  Stress: No Stress Concern Present   Feeling of Stress : Not at all  Social Connections: Socially Isolated   Frequency of Communication with Friends and Family: Once a week   Frequency of Social Gatherings with Friends and Family: Once a week   Attends Religious Services: Never   Marine scientist or Organizations: No   Attends Music therapist: Never   Marital Status: Never married  Human resources officer Violence: Not At Risk   Fear of Current or Ex-Partner: No   Emotionally Abused: No   Physically Abused: No   Sexually Abused: No    Family History  Problem Relation Age of Onset   Heart disease Mother    Diabetes Mother    Esophageal cancer Father        42's   Colon cancer Neg Hx      Review of Systems  Constitutional: Negative.  Negative for chills and fever.  HENT: Negative.  Negative for congestion and sore throat.   Respiratory: Negative.  Negative for cough and shortness of breath.   Cardiovascular: Negative.  Negative for chest pain and palpitations.  Gastrointestinal:  Negative for abdominal pain, diarrhea, nausea and vomiting.  Genitourinary: Negative.  Negative for dysuria and hematuria.  Musculoskeletal:  Negative for back pain and joint pain.  Skin: Negative.  Negative for rash.  Neurological: Negative.  Negative for dizziness and headaches.  All other systems reviewed and are negative.   Physical Exam Vitals reviewed.  Constitutional:      Appearance: Normal appearance.  HENT:     Head: Normocephalic.  Eyes:     Extraocular Movements: Extraocular movements intact.     Pupils: Pupils are equal, round, and reactive to light.  Cardiovascular:     Rate and Rhythm: Normal rate and regular rhythm.     Pulses:  Normal pulses.     Heart sounds: Normal heart sounds.  Pulmonary:     Effort: Pulmonary effort is normal.     Breath sounds: Normal breath sounds.  Musculoskeletal:     Cervical back: Normal range of motion.     Comments: Left arm: No erythema or ecchymosis.  Neurovascularly intact.  Good distal pulses.  Full range of motion.  No tenderness or swelling.  Normal exam.  Skin:    General: Skin is warm and dry.     Capillary Refill: Capillary refill takes less than 2 seconds.  Neurological:     General: No  focal deficit present.     Mental Status: She is alert and oriented to person, place, and time.  Psychiatric:        Mood and Affect: Mood normal.        Behavior: Behavior normal.     ASSESSMENT & PLAN: Clinically stable.  No red flag signs or symptoms.  No medical concerns identified during this visit. Differential diagnosis discussed.  Most likely musculoskeletal in nature.  Asymptomatic at present time. Advised to contact the office if clinical picture changes or develops any new symptoms during the next several days or several weeks.  A total of 30 minutes was spent with the patient and counseling/coordination of care regarding preparing for this visit, review of most recent office visit notes, review of all medications and risk factors for cardiovascular disease, review of most recent blood work done last November, differential diagnosis of left arm pain, prognosis, documentation and need for follow-up.  Problem List Items Addressed This Visit       Cardiovascular and Mediastinum   Essential hypertension    Well-controlled hypertension.  Continue lisinopril 10 mg daily. BP Readings from Last 3 Encounters:  04/02/21 128/76  02/10/21 120/68  01/22/21 130/70           Other   Dyslipidemia    Stable.  Diet and nutrition discussed. The 10-year ASCVD risk score (Arnett DK, et al., 2019) is: 6.9%   Values used to calculate the score:     Age: 12 years     Sex: Female      Is Non-Hispanic African American: No     Diabetic: No     Tobacco smoker: No     Systolic Blood Pressure: 0000000 mmHg     Is BP treated: Yes     HDL Cholesterol: 77.6 mg/dL     Total Cholesterol: 225 mg/dL Continue rosuvastatin 10 mg daily.      Left arm pain - Primary    Most likely musculoskeletal in nature.  Asymptomatic today.  No red flag signs or symptoms.  Unremarkable exam.  Stable.  No clinical concerns at this time. Differential diagnosis discussed.      Patient Instructions  Health Maintenance, Female Adopting a healthy lifestyle and getting preventive care are important in promoting health and wellness. Ask your health care provider about: The right schedule for you to have regular tests and exams. Things you can do on your own to prevent diseases and keep yourself healthy. What should I know about diet, weight, and exercise? Eat a healthy diet  Eat a diet that includes plenty of vegetables, fruits, low-fat dairy products, and lean protein. Do not eat a lot of foods that are high in solid fats, added sugars, or sodium. Maintain a healthy weight Body mass index (BMI) is used to identify weight problems. It estimates body fat based on height and weight. Your health care provider can help determine your BMI and help you achieve or maintain a healthy weight. Get regular exercise Get regular exercise. This is one of the most important things you can do for your health. Most adults should: Exercise for at least 150 minutes each week. The exercise should increase your heart rate and make you sweat (moderate-intensity exercise). Do strengthening exercises at least twice a week. This is in addition to the moderate-intensity exercise. Spend less time sitting. Even light physical activity can be beneficial. Watch cholesterol and blood lipids Have your blood tested for lipids and cholesterol at 66 years of age,  then have this test every 5 years. Have your cholesterol levels checked  more often if: Your lipid or cholesterol levels are high. You are older than 66 years of age. You are at high risk for heart disease. What should I know about cancer screening? Depending on your health history and family history, you may need to have cancer screening at various ages. This may include screening for: Breast cancer. Cervical cancer. Colorectal cancer. Skin cancer. Lung cancer. What should I know about heart disease, diabetes, and high blood pressure? Blood pressure and heart disease High blood pressure causes heart disease and increases the risk of stroke. This is more likely to develop in people who have high blood pressure readings or are overweight. Have your blood pressure checked: Every 3-5 years if you are 15-61 years of age. Every year if you are 21 years old or older. Diabetes Have regular diabetes screenings. This checks your fasting blood sugar level. Have the screening done: Once every three years after age 18 if you are at a normal weight and have a low risk for diabetes. More often and at a younger age if you are overweight or have a high risk for diabetes. What should I know about preventing infection? Hepatitis B If you have a higher risk for hepatitis B, you should be screened for this virus. Talk with your health care provider to find out if you are at risk for hepatitis B infection. Hepatitis C Testing is recommended for: Everyone born from [birth year] through 1965. Anyone with known risk factors for hepatitis C. Sexually transmitted infections (STIs) Get screened for STIs, including gonorrhea and chlamydia, if: You are sexually active and are younger than 66 years of age. You are older than 66 years of age and your health care provider tells you that you are at risk for this type of infection. Your sexual activity has changed since you were last screened, and you are at increased risk for chlamydia or gonorrhea. Ask your health care provider if you are at  risk. Ask your health care provider about whether you are at high risk for HIV. Your health care provider may recommend a prescription medicine to help prevent HIV infection. If you choose to take medicine to prevent HIV, you should first get tested for HIV. You should then be tested every 3 months for as long as you are taking the medicine. Pregnancy If you are about to stop having your period (premenopausal) and you may become pregnant, seek counseling before you get pregnant. Take 400 to 800 micrograms (mcg) of folic acid every day if you become pregnant. Ask for birth control (contraception) if you want to prevent pregnancy. Osteoporosis and menopause Osteoporosis is a disease in which the bones lose minerals and strength with aging. This can result in bone fractures. If you are 95 years old or older, or if you are at risk for osteoporosis and fractures, ask your health care provider if you should: Be screened for bone loss. Take a calcium or vitamin D supplement to lower your risk of fractures. Be given hormone replacement therapy (HRT) to treat symptoms of menopause. Follow these instructions at home: Alcohol use Do not drink alcohol if: Your health care provider tells you not to drink. You are pregnant, may be pregnant, or are planning to become pregnant. If you drink alcohol: Limit how much you have to: 0-1 drink a day. Know how much alcohol is in your drink. In the [place] one drink equals one 12 oz  bottle of beer (355 mL), one 5 oz glass of wine (148 mL), or one 1 oz glass of hard liquor (44 mL). Lifestyle Do not use any products that contain nicotine or tobacco. These products include cigarettes, chewing tobacco, and vaping devices, such as e-cigarettes. If you need help quitting, ask your health care provider. Do not use street drugs. Do not share needles. Ask your health care provider for help if you need support or information about quitting drugs. General instructions Schedule  regular health, dental, and eye exams. Stay current with your vaccines. Tell your health care provider if: You often feel depressed. You have ever been abused or do not feel safe at home. Summary Adopting a healthy lifestyle and getting preventive care are important in promoting health and wellness. Follow your health care provider's instructions about healthy diet, exercising, and getting tested or screened for diseases. Follow your health care provider's instructions on monitoring your cholesterol and blood pressure. This information is not intended to replace advice given to you by your health care provider. Make sure you discuss any questions you have with your health care provider. Document Revised: 07/15/2020 Document Reviewed: 07/15/2020 Elsevier Patient Education  2022 [place], MD [facility]

## 2021-04-02 NOTE — Assessment & Plan Note (Signed)
Well-controlled hypertension.  Continue lisinopril 10 mg daily. BP Readings from Last 3 Encounters:  04/02/21 128/76  02/10/21 120/68  01/22/21 130/70

## 2021-04-02 NOTE — Patient Instructions (Signed)

## 2021-05-15 ENCOUNTER — Other Ambulatory Visit: Payer: Self-pay | Admitting: Emergency Medicine

## 2021-05-15 DIAGNOSIS — F418 Other specified anxiety disorders: Secondary | ICD-10-CM

## 2021-05-15 DIAGNOSIS — J302 Other seasonal allergic rhinitis: Secondary | ICD-10-CM

## 2021-05-15 DIAGNOSIS — F419 Anxiety disorder, unspecified: Secondary | ICD-10-CM

## 2021-05-20 ENCOUNTER — Other Ambulatory Visit: Payer: Self-pay | Admitting: Emergency Medicine

## 2021-05-20 DIAGNOSIS — J302 Other seasonal allergic rhinitis: Secondary | ICD-10-CM

## 2021-07-16 ENCOUNTER — Other Ambulatory Visit: Payer: Self-pay | Admitting: Emergency Medicine

## 2021-07-16 DIAGNOSIS — E785 Hyperlipidemia, unspecified: Secondary | ICD-10-CM

## 2021-07-16 DIAGNOSIS — F418 Other specified anxiety disorders: Secondary | ICD-10-CM

## 2021-07-16 DIAGNOSIS — F32A Depression, unspecified: Secondary | ICD-10-CM

## 2021-07-18 ENCOUNTER — Other Ambulatory Visit: Payer: Self-pay | Admitting: Emergency Medicine

## 2021-07-18 DIAGNOSIS — F419 Anxiety disorder, unspecified: Secondary | ICD-10-CM

## 2021-08-26 ENCOUNTER — Ambulatory Visit: Payer: Medicare Other | Admitting: Emergency Medicine

## 2021-09-16 ENCOUNTER — Other Ambulatory Visit: Payer: Self-pay | Admitting: Emergency Medicine

## 2021-09-16 DIAGNOSIS — F418 Other specified anxiety disorders: Secondary | ICD-10-CM

## 2021-09-16 DIAGNOSIS — M25519 Pain in unspecified shoulder: Secondary | ICD-10-CM

## 2021-09-16 DIAGNOSIS — F419 Anxiety disorder, unspecified: Secondary | ICD-10-CM

## 2021-11-03 ENCOUNTER — Ambulatory Visit (INDEPENDENT_AMBULATORY_CARE_PROVIDER_SITE_OTHER): Payer: Medicare Other | Admitting: Emergency Medicine

## 2021-11-03 ENCOUNTER — Encounter: Payer: Self-pay | Admitting: Emergency Medicine

## 2021-11-03 ENCOUNTER — Ambulatory Visit (INDEPENDENT_AMBULATORY_CARE_PROVIDER_SITE_OTHER): Payer: Medicare Other

## 2021-11-03 VITALS — BP 130/70 | HR 64 | Temp 98.3°F | Ht 65.0 in | Wt 137.2 lb

## 2021-11-03 DIAGNOSIS — S8392XA Sprain of unspecified site of left knee, initial encounter: Secondary | ICD-10-CM

## 2021-11-03 DIAGNOSIS — F32A Depression, unspecified: Secondary | ICD-10-CM

## 2021-11-03 DIAGNOSIS — I10 Essential (primary) hypertension: Secondary | ICD-10-CM

## 2021-11-03 DIAGNOSIS — F418 Other specified anxiety disorders: Secondary | ICD-10-CM | POA: Diagnosis not present

## 2021-11-03 DIAGNOSIS — S139XXA Sprain of joints and ligaments of unspecified parts of neck, initial encounter: Secondary | ICD-10-CM

## 2021-11-03 DIAGNOSIS — M25562 Pain in left knee: Secondary | ICD-10-CM | POA: Diagnosis not present

## 2021-11-03 DIAGNOSIS — M4802 Spinal stenosis, cervical region: Secondary | ICD-10-CM | POA: Diagnosis not present

## 2021-11-03 DIAGNOSIS — S8992XA Unspecified injury of left lower leg, initial encounter: Secondary | ICD-10-CM | POA: Diagnosis not present

## 2021-11-03 DIAGNOSIS — M47812 Spondylosis without myelopathy or radiculopathy, cervical region: Secondary | ICD-10-CM | POA: Diagnosis not present

## 2021-11-03 DIAGNOSIS — F419 Anxiety disorder, unspecified: Secondary | ICD-10-CM

## 2021-11-03 MED ORDER — ALPRAZOLAM 0.5 MG PO TABS: ORAL_TABLET | ORAL | 1 refills | Status: DC

## 2021-11-03 NOTE — Assessment & Plan Note (Signed)
Unremarkable x-rays. Unremarkable exam. No concerns.

## 2021-11-03 NOTE — Assessment & Plan Note (Addendum)
Well-controlled hypertension. Continue lisinopril 10 mg daily. BP Readings from Last 3 Encounters:  11/03/21 130/70  04/02/21 128/76  02/10/21 120/68

## 2021-11-03 NOTE — Progress Notes (Signed)
Katie Gardner 66 y.o.   Chief Complaint  Patient presents with   Knee Pain    Knee pain after workout as well as patient fell in bathtub   Neck Pain    Neck pain after workout , patient fell in bathtub    HISTORY OF PRESENT ILLNESS: This is a 66 y.o. female fell in the bathtub 1 week ago and injured her neck complaining of pain to back of her neck since.  Slowly getting better Also injured her left knee while working out last week complaining of pain. Also complaining of feeling depressed.  Feels lonely and does not have many friends or family members around.  Denies suicidal thoughts.  Knee Pain   Neck Pain  Pertinent negatives include no chest pain, fever or headaches.     [name]   Medication Sig Start Date End Date Taking? Authorizing Provider  conjugated estrogens (PREMARIN) vaginal cream Place 1 Applicatorful vaginally daily. 07/19/19  Yes Mala Gibbard, Eilleen Kempf, MD  cyclobenzaprine (FLEXERIL) 5 MG tablet TAKE 1 TABLET(5 MG) BY MOUTH THREE TIMES DAILY AS NEEDED FOR MUSCLE SPASMS 09/16/21  Yes Georgina Quint, MD  FLUoxetine (PROZAC) 20 MG capsule TAKE 1 CAPSULE(20 MG) BY MOUTH DAILY 07/18/21  Yes Fortunato Nordin, Eilleen Kempf, MD  ibuprofen (ADVIL) 600 MG tablet TAKE 1 TABLET(600 MG) BY MOUTH EVERY 8 HOURS AS NEEDED FOR MODERATE PAIN 09/16/21  Yes Harrell Niehoff, Eilleen Kempf, MD  lisinopril (ZESTRIL) 10 MG tablet Take 1 tablet (10 mg total) by mouth daily. 02/10/21  Yes Demontae Antunes, Eilleen Kempf, MD  loratadine (CLARITIN) 10 MG tablet Take 1 tablet (10 mg total) by mouth daily. 09/12/18  Yes Karista Aispuro, Eilleen Kempf, MD  rosuvastatin (CRESTOR) 10 MG tablet TAKE 1 TABLET(10 MG) BY MOUTH DAILY 04/01/21  Yes Sruthi Maurer, Eilleen Kempf, MD  ALPRAZolam Prudy Feeler) 0.5 MG tablet TAKE 1 TABLET(0.5 MG) BY MOUTH TWICE DAILY AS NEEDED FOR ANXIETY 11/03/21   Georgina Quint, MD  cetirizine (ZYRTEC) 10 MG tablet TAKE 1 TABLET(10 MG) BY MOUTH DAILY Patient not taking: Reported on 11/03/2021  05/15/21   Georgina Quint, MD  sulfamethoxazole-trimethoprim (BACTRIM DS) 800-160 MG tablet Take 1 tablet by mouth 2 (two) times daily. 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   Dyspareunia in female 09/13/2019   Skin tags, multiple acquired 07/27/2019   Chronic insomnia 05/31/2019   Essential hypertension 12/17/2017   Seasonal allergies 12/17/2017   Anxiety and depression 11/13/2016   Chronic anxiety 11/13/2016   Chronic sore throat 11/13/2016   Hot flashes    Situational anxiety    Dyslipidemia     Past Medical History:  Diagnosis Date   Allergy    seasonal   Anxiety    Depression    Hematuria    Hot flashes    Hx of cardiovascular stress test    Lex MV 12/13:  EF 68%, mild apical thinning, no ischemia   Hyperlipidemia     Past Surgical History:  Procedure Laterality Date   BREAST LUMPECTOMY     right  breast, not cancer   Carpel tunnel     ECTOPIC PREGNANCY SURGERY      Social History   Socioeconomic History   Marital status: Legally Separated    Spouse name: Not on file   Number of children: 1   Years of education: 8th grade   Highest education level: Not on file  Occupational History    Employer: UNEMPLOYED  Tobacco Use   Smoking status: Former   Smokeless tobacco: Never  Building services engineerVaping Use   Vaping Use: Never used  Substance and Sexual Activity   Alcohol use: No   Drug use: No   Sexual activity: Yes  Other Topics Concern   Not on file  Social History Narrative   Not on file   Social Determinants of Health   Financial  Resource Strain: Low Risk  (12/26/2020)   Overall Financial Resource Strain (CARDIA)    Difficulty of Paying Living Expenses: Not hard at all  Food Insecurity: No Food Insecurity (12/26/2020)   Hunger Vital Sign    Worried About Running Out of Food in the Last Year: Never true    Ran Out of Food in the Last Year: Never true  Transportation Needs: No Transportation Needs (12/26/2020)   PRAPARE - Administrator, Civil ServiceTransportation    Lack of Transportation (Medical): No    Lack of Transportation (Non-Medical): No  Physical Activity: Inactive (12/26/2020)   Exercise Vital Sign    Days of Exercise per Week: 0 days    Minutes of Exercise per Session: 0 min  Stress: No Stress Concern Present (12/26/2020)   [employer] - Occupational Stress Questionnaire    Feeling of Stress : Not at all  Social Connections: Socially Isolated (12/26/2020)   Social Connection and Isolation Panel [NHANES]    Frequency of Communication with Friends and Family: Once a week    Frequency of Social Gatherings with Friends and Family: Once a week    Attends Religious Services: Never    Database administratorActive Member of Clubs or Organizations: No    Attends BankerClub or Organization Meetings: Never    Marital Status: Never married  Intimate Partner Violence: Not At Risk (12/26/2020)   Humiliation, Afraid, Rape, and Kick questionnaire    Fear of Current or Ex-Partner: No    Emotionally Abused: No    Physically Abused: No    Sexually Abused: No    Family History  Problem Relation Age of Onset   Heart disease Mother    Diabetes Mother    Esophageal cancer Father        9670's   Colon cancer Neg Hx      Review of Systems  Constitutional: Negative.  Negative for chills and fever.  HENT: Negative.  Negative for congestion and sore throat.   Respiratory: Negative.  Negative for cough and shortness of breath.   Cardiovascular:  Negative for chest pain and palpitations.  Gastrointestinal:  Negative for abdominal pain,  diarrhea, nausea and vomiting.  Genitourinary: Negative.  Negative for dysuria and hematuria.  Musculoskeletal:  Positive for joint pain (Left knee pain) and neck pain.  Skin: Negative.  Negative for rash.  Neurological:  Negative for dizziness and headaches.  All other systems reviewed and are negative.  Today's Vitals   11/03/21 1424 11/03/21 1429 11/03/21 1654  BP: (!) 146/90 (!) 158/80 130/70  Pulse: 64    Temp: 98.3 F (36.8 C)    TempSrc: Oral    SpO2: 98%    Weight: 137 lb 4 oz (  62.3 kg)    Height: 5\' 5"  (1.651 m)     Body mass index is 22.84 kg/m.   Physical Exam Vitals reviewed.  Constitutional:      Appearance: Normal appearance.  HENT:     Head: Normocephalic.  Eyes:     Extraocular Movements: Extraocular movements intact.     Pupils: Pupils are equal, round, and reactive to light.  Cardiovascular:     Rate and Rhythm: Normal rate and regular rhythm.     Pulses: Normal pulses.     Heart sounds: Normal heart sounds.  Pulmonary:     Effort: Pulmonary effort is normal.     Breath sounds: Normal breath sounds.  Musculoskeletal:     Cervical back: No tenderness.     Comments: Cervical spine: No spine tenderness Left knee: No swelling.  No tenderness.  Full range of motion.  Lymphadenopathy:     Cervical: No cervical adenopathy.  Skin:    General: Skin is warm and dry.     Capillary Refill: Capillary refill takes less than 2 seconds.  Neurological:     General: No focal deficit present.     Mental Status: She is alert and oriented to person, place, and time.  Psychiatric:        Mood and Affect: Mood normal.        Behavior: Behavior normal.    DG Knee Complete 4 Views Left  Result Date: 11/03/2021 CLINICAL DATA:  Twisting injury 2 weeks ago with persistent pain. EXAM: LEFT KNEE - COMPLETE 4+ VIEW COMPARISON:  None Available. FINDINGS: The mineralization and alignment are normal. There is no evidence of acute fracture or dislocation. No significant joint  effusion. Mild degenerative changes, primarily within the medial compartment. IMPRESSION: No evidence of acute fracture or dislocation. Mild degenerative changes. Electronically Signed   By: 11/05/2021 M.D.   On: 11/03/2021 15:36   DG Cervical Spine 2 or 3 views  Result Date: 11/03/2021 CLINICAL DATA:  Neck pain since falling 10-12 days ago. EXAM: CERVICAL SPINE - 2-3 VIEW COMPARISON:  Radiographs 05/02/2018 FINDINGS: The prevertebral soft tissues are normal. The alignment is anatomic through T1. There is no evidence of acute fracture or traumatic subluxation. The C1-2 articulation appears normal in the AP projection. Similar mild multilevel spondylosis with disc space narrowing greatest at C6-7. IMPRESSION: No evidence of acute cervical spine fracture, traumatic subluxation or static signs of instability. Mild spondylosis, similar to prior radiographs. Electronically Signed   By: 05/04/2018 M.D.   On: 11/03/2021 15:35     ASSESSMENT & PLAN: A total of 47 minutes was spent with the patient and counseling/coordination of care regarding preparing for this visit, review of most recent office visit notes, review of multiple chronic medical problems and their treatment, review of all medications, diagnosis and management of chronic depression and anxiety, availability of behavioral health services, prognosis, documentation, need for follow-up.  Problem List Items Addressed This Visit       Cardiovascular and Mediastinum   Essential hypertension    Well-controlled hypertension. Continue lisinopril 10 mg daily. BP Readings from Last 3 Encounters:  11/03/21 130/70  04/02/21 128/76  02/10/21 120/68           Musculoskeletal and Integument   Neck sprain    Unremarkable x-rays. Unremarkable exam. No concerns.      Relevant Orders   DG Cervical Spine 2 or 3 views (Completed)   Sprain of left knee    Unremarkable exam. Normal x-ray.  May need orthopedic evaluation in the future.       Relevant Orders   DG Knee Complete 4 Views Left (Completed)     Other   Situational anxiety    Continues to take alprazolam 0.5 mg as needed.      Relevant Medications   ALPRAZolam (XANAX) 0.5 MG tablet   Anxiety and depression - Primary    Currently active and affecting quality of life. Advised to take Prozac 25 mg daily, she has been taking it erratically. Depression/anxiety management advice given. Refuses psychiatry referral.      Relevant Medications   ALPRAZolam (XANAX) 0.5 MG tablet   Patient Instructions  Major Depressive Disorder, Adult Major depressive disorder is a mental health condition. This disorder affects feelings. It can also affect the body. Symptoms of this condition last most of the day, almost every day, for 2 weeks. This disorder can affect: Relationships. Daily activities, such as work and school. Activities that you normally like to do. What are the causes? The cause of this condition is not known. The disorder is likely caused by a mix of things, including: Your personality, such as being a shy person. Your behavior, or how you act toward others. Your thoughts and feelings. Too much alcohol or drugs. How you react to stress. Health and mental problems that you have had for a long time. Things that hurt you in the past (trauma). Big changes in your life, such as divorce. What increases the risk? The following factors may make you more likely to develop this condition: Having family members with depression. Being a woman. Problems in the family. Low levels of some brain chemicals. Things that caused you pain as a child, especially if you lost a parent or were abused. A lot of stress in your life, such as from: Living without basic needs of life, such as food and shelter. Being treated poorly because of race, sex, or religion (discrimination). Health and mental problems that you have had for a long time. What are the signs or symptoms? The  main symptoms of this condition are: Being sad all the time. Being grouchy all the time. Loss of interest in things and activities. Other symptoms include: Sleeping too much or too little. Eating too much or too little. Gaining or losing weight, without knowing why. Feeling tired or having low energy. Being restless and weak. Feeling hopeless, worthless, or guilty. Trouble thinking clearly or making decisions. Thoughts of hurting yourself or others, or thoughts of ending your life. Spending a lot of time alone. Inability to complete common tasks of daily life. If you have very bad MDD, you may: Believe things that are not true. Hear, see, taste, or feel things that are not there. Have mild depression that lasts for at least 2 years. Feel very sad and hopeless. Have trouble speaking or moving. How is this treated? This condition may be treated with: Talk therapy. This teaches you to know bad thoughts, feelings, and actions and how to change them. This can also help you to communicate with others. This can be done with members of your family. Medicines. These can be used to treat worry (anxiety), depression, or low levels of chemicals in the brain. Lifestyle changes. You may need to: Limit alcohol use. Limit drug use. Get regular exercise. Get plenty of sleep. Make healthy eating choices. Spend more time outdoors. Brain stimulation. This treatment excites the brain. This is done when symptoms are very bad or have not gotten better with  other treatments. Follow these instructions at home: Activity Get regular exercise as told. Spend time outdoors as told. Make time to do the things you enjoy. Find ways to deal with stress. Try to: Meditate. Do deep breathing. Spend time in nature. Keep a journal. Return to your normal activities as told by your doctor. Ask your doctor what activities are safe for you. Alcohol and drug use If you drink alcohol: Limit how much you use  to: 0-1 drink a day for women. 0-2 drinks a day for men. Be aware of how much alcohol is in your drink. In the [place] one drink equals one 12 oz bottle of beer (355 mL), one 5 oz glass of wine (148 mL), or one 1 oz glass of hard liquor (44 mL). Talk to your doctor about: Alcohol use. Alcohol can affect some medicines. Any drug use. General instructions  Take over-the-counter and prescription medicines and herbal preparations only as told by your doctor. Eat a healthy diet. Get a lot of sleep. Think about joining a support group. Your doctor may be able to suggest one. Keep all follow-up visits as told by your doctor. This is important. Where to find more information: [employer] on Mental Illness: www.nami.org [place] [employer] of Mental Health: http://www.maynard.net/ American Psychiatric Association: www.psychiatry.org/patients-families/ Contact a doctor if: Your symptoms get worse. You get new symptoms. Get help right away if: You hurt yourself. You have serious thoughts about hurting yourself or others. You see, hear, taste, smell, or feel things that are not there. If you ever feel like you may hurt yourself or others, or have thoughts about taking your own life, get help right away. Go to your nearest emergency department or: Call your local emergency services (911 in the [place] Call a suicide crisis helpline, such as the National Suicide Prevention Lifeline at (931)870-1134 or 988 in the [place] This is open 24 hours a day in the [place] Text the Crisis Text Line at 806-596-3140 (in the [place] Summary Major depressive disorder is a mental health condition. This disorder affects feelings. Symptoms of this condition last most of the day, almost every day, for 2 weeks. The symptoms of this disorder can cause problems with relationships and with daily activities. There are treatments and support for people who get this disorder. You may need more than one type of treatment. Get help  right away if you have serious thoughts about hurting yourself or others. This information is not intended to replace advice given to you by your health care provider. Make sure you discuss any questions you have with your health care provider. Document Revised: 09/18/2020 Document Reviewed: 02/04/2019 Elsevier Patient Education  2023 Elsevier Inc.    Edwina Barth, MD [facility]

## 2021-11-03 NOTE — Assessment & Plan Note (Signed)
Currently active and affecting quality of life. Advised to take Prozac 25 mg daily, she has been taking it erratically. Depression/anxiety management advice given. Refuses psychiatry referral.

## 2021-11-03 NOTE — Patient Instructions (Signed)
Major Depressive Disorder, Adult Major depressive disorder is a mental health condition. This disorder affects feelings. It can also affect the body. Symptoms of this condition last most of the day, almost every day, for 2 weeks. This disorder can affect: Relationships. Daily activities, such as work and school. Activities that you normally like to do. What are the causes? The cause of this condition is not known. The disorder is likely caused by a mix of things, including: Your personality, such as being a shy person. Your behavior, or how you act toward others. Your thoughts and feelings. Too much alcohol or drugs. How you react to stress. Health and mental problems that you have had for a long time. Things that hurt you in the past (trauma). Big changes in your life, such as divorce. What increases the risk? The following factors may make you more likely to develop this condition: Having family members with depression. Being a woman. Problems in the family. Low levels of some brain chemicals. Things that caused you pain as a child, especially if you lost a parent or were abused. A lot of stress in your life, such as from: Living without basic needs of life, such as food and shelter. Being treated poorly because of race, sex, or religion (discrimination). Health and mental problems that you have had for a long time. What are the signs or symptoms? The main symptoms of this condition are: Being sad all the time. Being grouchy all the time. Loss of interest in things and activities. Other symptoms include: Sleeping too much or too little. Eating too much or too little. Gaining or losing weight, without knowing why. Feeling tired or having low energy. Being restless and weak. Feeling hopeless, worthless, or guilty. Trouble thinking clearly or making decisions. Thoughts of hurting yourself or others, or thoughts of ending your life. Spending a lot of time alone. Inability to  complete common tasks of daily life. If you have very bad MDD, you may: Believe things that are not true. Hear, see, taste, or feel things that are not there. Have mild depression that lasts for at least 2 years. Feel very sad and hopeless. Have trouble speaking or moving. How is this treated? This condition may be treated with: Talk therapy. This teaches you to know bad thoughts, feelings, and actions and how to change them. This can also help you to communicate with others. This can be done with members of your family. Medicines. These can be used to treat worry (anxiety), depression, or low levels of chemicals in the brain. Lifestyle changes. You may need to: Limit alcohol use. Limit drug use. Get regular exercise. Get plenty of sleep. Make healthy eating choices. Spend more time outdoors. Brain stimulation. This treatment excites the brain. This is done when symptoms are very bad or have not gotten better with other treatments. Follow these instructions at home: Activity Get regular exercise as told. Spend time outdoors as told. Make time to do the things you enjoy. Find ways to deal with stress. Try to: Meditate. Do deep breathing. Spend time in nature. Keep a journal. Return to your normal activities as told by your doctor. Ask your doctor what activities are safe for you. Alcohol and drug use If you drink alcohol: Limit how much you use to: 0-1 drink a day for women. 0-2 drinks a day for men. Be aware of how much alcohol is in your drink. In the [place] one drink equals one 12 oz bottle of beer (355 mL),   one 5 oz glass of wine (148 mL), or one 1 oz glass of hard liquor (44 mL). Talk to your doctor about: Alcohol use. Alcohol can affect some medicines. Any drug use. General instructions  Take over-the-counter and prescription medicines and herbal preparations only as told by your doctor. Eat a healthy diet. Get a lot of sleep. Think about joining a support group.  Your doctor may be able to suggest one. Keep all follow-up visits as told by your doctor. This is important. Where to find more information: National Alliance on Mental Illness: www.nami.org [place] National Institute of Mental Health: www.nimh.nih.gov American Psychiatric Association: www.psychiatry.org/patients-families/ Contact a doctor if: Your symptoms get worse. You get new symptoms. Get help right away if: You hurt yourself. You have serious thoughts about hurting yourself or others. You see, hear, taste, smell, or feel things that are not there. If you ever feel like you may hurt yourself or others, or have thoughts about taking your own life, get help right away. Go to your nearest emergency department or: Call your local emergency services (911 in the [place] Call a suicide crisis helpline, such as the National Suicide Prevention Lifeline at 1-800-273-8255 or 988 in the [place] This is open 24 hours a day in the [place] Text the Crisis Text Line at 741741 (in the [place] Summary Major depressive disorder is a mental health condition. This disorder affects feelings. Symptoms of this condition last most of the day, almost every day, for 2 weeks. The symptoms of this disorder can cause problems with relationships and with daily activities. There are treatments and support for people who get this disorder. You may need more than one type of treatment. Get help right away if you have serious thoughts about hurting yourself or others. This information is not intended to replace advice given to you by your health care provider. Make sure you discuss any questions you have with your health care provider. Document Revised: 09/18/2020 Document Reviewed: 02/04/2019 Elsevier Patient Education  2023 Elsevier Inc.  

## 2021-11-03 NOTE — Assessment & Plan Note (Signed)
Continues to take alprazolam 0.5 mg as needed.

## 2021-11-03 NOTE — Assessment & Plan Note (Signed)
Unremarkable exam. Normal x-ray. May need orthopedic evaluation in the future.

## 2021-11-15 ENCOUNTER — Other Ambulatory Visit: Payer: Self-pay | Admitting: Emergency Medicine

## 2021-11-15 DIAGNOSIS — I10 Essential (primary) hypertension: Secondary | ICD-10-CM

## 2021-11-17 DIAGNOSIS — B351 Tinea unguium: Secondary | ICD-10-CM | POA: Diagnosis not present

## 2021-11-17 DIAGNOSIS — L659 Nonscarring hair loss, unspecified: Secondary | ICD-10-CM | POA: Diagnosis not present

## 2021-11-17 DIAGNOSIS — L988 Other specified disorders of the skin and subcutaneous tissue: Secondary | ICD-10-CM | POA: Diagnosis not present

## 2021-11-26 ENCOUNTER — Encounter: Payer: Self-pay | Admitting: Emergency Medicine

## 2021-11-26 ENCOUNTER — Ambulatory Visit (INDEPENDENT_AMBULATORY_CARE_PROVIDER_SITE_OTHER): Payer: Medicare Other | Admitting: Emergency Medicine

## 2021-11-26 VITALS — BP 132/80 | HR 66 | Temp 98.3°F | Ht 65.0 in | Wt 134.0 lb

## 2021-11-26 DIAGNOSIS — M7918 Myalgia, other site: Secondary | ICD-10-CM

## 2021-11-26 DIAGNOSIS — M79632 Pain in left forearm: Secondary | ICD-10-CM | POA: Diagnosis not present

## 2021-11-26 NOTE — Patient Instructions (Signed)
Health Maintenance After Age 65 After age 66, you are at a higher risk for certain long-term diseases and infections as well as injuries from falls. Falls are a major cause of broken bones and head injuries in people who are older than age 66. Getting regular preventive care can help to keep you healthy and well. Preventive care includes getting regular testing and making lifestyle changes as recommended by your health care provider. Talk with your health care provider about: Which screenings and tests you should have. A screening is a test that checks for a disease when you have no symptoms. A diet and exercise plan that is right for you. What should I know about screenings and tests to prevent falls? Screening and testing are the best ways to find a health problem early. Early diagnosis and treatment give you the best chance of managing medical conditions that are common after age 66. Certain conditions and lifestyle choices may make you more likely to have a fall. Your health care provider may recommend: Regular vision checks. Poor vision and conditions such as cataracts can make you more likely to have a fall. If you wear glasses, make sure to get your prescription updated if your vision changes. Medicine review. Work with your health care provider to regularly review all of the medicines you are taking, including over-the-counter medicines. Ask your health care provider about any side effects that may make you more likely to have a fall. Tell your health care provider if any medicines that you take make you feel dizzy or sleepy. Strength and balance checks. Your health care provider may recommend certain tests to check your strength and balance while standing, walking, or changing positions. Foot health exam. Foot pain and numbness, as well as not wearing proper footwear, can make you more likely to have a fall. Screenings, including: Osteoporosis screening. Osteoporosis is a condition that causes  the bones to get weaker and break more easily. Blood pressure screening. Blood pressure changes and medicines to control blood pressure can make you feel dizzy. Depression screening. You may be more likely to have a fall if you have a fear of falling, feel depressed, or feel unable to do activities that you used to do. Alcohol use screening. Using too much alcohol can affect your balance and may make you more likely to have a fall. Follow these instructions at home: Lifestyle Do not drink alcohol if: Your health care provider tells you not to drink. If you drink alcohol: Limit how much you have to: 0-1 drink a day for women. 0-2 drinks a day for men. Know how much alcohol is in your drink. In the [place] one drink equals one 12 oz bottle of beer (355 mL), one 5 oz glass of wine (148 mL), or one 1 oz glass of hard liquor (44 mL). Do not use any products that contain nicotine or tobacco. These products include cigarettes, chewing tobacco, and vaping devices, such as e-cigarettes. If you need help quitting, ask your health care provider. Activity  Follow a regular exercise program to stay fit. This will help you maintain your balance. Ask your health care provider what types of exercise are appropriate for you. If you need a cane or walker, use it as recommended by your health care provider. Wear supportive shoes that have nonskid soles. Safety  Remove any tripping hazards, such as rugs, cords, and clutter. Install safety equipment such as grab bars in bathrooms and safety rails on stairs. Keep rooms and walkways   well-lit. General instructions Talk with your health care provider about your risks for falling. Tell your health care provider if: You fall. Be sure to tell your health care provider about all falls, even ones that seem minor. You feel dizzy, tiredness (fatigue), or off-balance. Take over-the-counter and prescription medicines only as told by your health care provider. These include  supplements. Eat a healthy diet and maintain a healthy weight. A healthy diet includes low-fat dairy products, low-fat (lean) meats, and fiber from whole grains, beans, and lots of fruits and vegetables. Stay current with your vaccines. Schedule regular health, dental, and eye exams. Summary Having a healthy lifestyle and getting preventive care can help to protect your health and wellness after age 66. Screening and testing are the best way to find a health problem early and help you avoid having a fall. Early diagnosis and treatment give you the best chance for managing medical conditions that are more common for people who are older than age 66. Falls are a major cause of broken bones and head injuries in people who are older than age 66. Take precautions to prevent a fall at home. Work with your health care provider to learn what changes you can make to improve your health and wellness and to prevent falls. This information is not intended to replace advice given to you by your health care provider. Make sure you discuss any questions you have with your health care provider. Document Revised: 07/15/2020 Document Reviewed: 07/15/2020 Elsevier Patient Education  2023 Elsevier Inc.  

## 2021-11-26 NOTE — Progress Notes (Signed)
Katie Gardner 66 y.o.   Chief Complaint  Patient presents with   Arm Pain    Left arm pain, swollen and painful ( forearm), started x 1 week ago     HISTORY OF PRESENT ILLNESS: This is a 66 y.o. female complaining of pain and swelling to left forearm that started 1 week ago progressively getting better.  No other complaints or medical concerns today.  Arm Pain  Pertinent negatives include no chest pain.     [name]   Medication Sig Start Date End Date Taking? Authorizing Provider  ALPRAZolam Duanne Moron) 0.5 MG tablet TAKE 1 TABLET(0.5 MG) BY MOUTH TWICE DAILY AS NEEDED FOR ANXIETY 11/03/21   Horald Pollen, MD  cetirizine (ZYRTEC) 10 MG tablet TAKE 1 TABLET(10 MG) BY MOUTH DAILY Patient not taking: Reported on 11/03/2021 05/15/21   Horald Pollen, MD  conjugated estrogens (PREMARIN) vaginal cream Place 1 Applicatorful vaginally daily. 07/19/19   Horald Pollen, MD  cyclobenzaprine (FLEXERIL) 5 MG tablet TAKE 1 TABLET(5 MG) BY MOUTH THREE TIMES DAILY AS NEEDED FOR MUSCLE SPASMS 09/16/21   Horald Pollen, MD  FLUoxetine (PROZAC) 20 MG capsule TAKE 1 CAPSULE(20 MG) BY MOUTH DAILY 07/18/21   Horald Pollen, MD  ibuprofen (ADVIL) 600 MG tablet TAKE 1 TABLET(600 MG) BY MOUTH EVERY 8 HOURS AS NEEDED FOR MODERATE PAIN 09/16/21   Horald Pollen, MD  lisinopril (ZESTRIL) 10 MG tablet TAKE 1 TABLET(10 MG) BY MOUTH DAILY 11/15/21   Horald Pollen, MD  loratadine (CLARITIN) 10 MG tablet Take 1 tablet (10 mg total) by mouth daily. 09/12/18   Horald Pollen, MD  rosuvastatin (CRESTOR) 10 MG tablet TAKE 1 TABLET(10 MG) BY MOUTH DAILY 04/01/21   Horald Pollen, MD  sulfamethoxazole-trimethoprim (BACTRIM DS) 800-160 MG tablet Take 1 tablet by mouth 2 (two) times daily. 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   Dyspareunia in female 09/13/2019   Skin tags, multiple acquired 07/27/2019   Chronic insomnia 05/31/2019   Essential hypertension 12/17/2017   Seasonal allergies 12/17/2017   Anxiety and depression 11/13/2016   Chronic anxiety 11/13/2016   Chronic sore throat 11/13/2016   Hot flashes    Situational anxiety    Dyslipidemia     Past Medical History:  Diagnosis Date   Allergy    seasonal   Anxiety    Depression    Hematuria    Hot flashes    Hx of cardiovascular stress test    Lex MV 12/13:  EF 68%, mild apical thinning, no ischemia   Hyperlipidemia     Past Surgical History:  Procedure Laterality Date   BREAST LUMPECTOMY     right breast, not cancer   Carpel tunnel     ECTOPIC PREGNANCY SURGERY      Social History   Socioeconomic History   Marital status: Legally Separated    Spouse name: Not on file   Number of children: 1  Years of education: 8th grade   Highest education level: Not on file  Occupational History    Employer: UNEMPLOYED  Tobacco Use   Smoking status: Former   Smokeless tobacco: Never  Vaping Use   Vaping Use: Never used  Substance and Sexual Activity   Alcohol use: No   Drug use: No   Sexual activity: Yes  Other Topics Concern   Not on file  Social History Narrative   Not on file   Social Determinants of Health   Financial Resource Strain: Low Risk  (12/26/2020)   Overall Financial Resource Strain (CARDIA)    Difficulty of Paying Living Expenses: Not hard at all  Food Insecurity: No Food Insecurity (12/26/2020)   Hunger Vital Sign    Worried About Running  Out of Food in the Last Year: Never true    Ran Out of Food in the Last Year: Never true  Transportation Needs: No Transportation Needs (12/26/2020)   PRAPARE - Administrator, Civil Service (Medical): No    Lack of Transportation (Non-Medical): No  Physical Activity: Inactive (12/26/2020)   Exercise Vital Sign    Days of Exercise per Week: 0 days    Minutes of Exercise per Session: 0 min  Stress: No Stress Concern Present (12/26/2020)   [employer] of Occupational Health - Occupational Stress Questionnaire    Feeling of Stress : Not at all  Social Connections: Socially Isolated (12/26/2020)   Social Connection and Isolation Panel [NHANES]    Frequency of Communication with Friends and Family: Once a week    Frequency of Social Gatherings with Friends and Family: Once a week    Attends Religious Services: Never    Database administrator or Organizations: No    Attends Banker Meetings: Never    Marital Status: Never married  Intimate Partner Violence: Not At Risk (12/26/2020)   Humiliation, Afraid, Rape, and Kick questionnaire    Fear of Current or Ex-Partner: No    Emotionally Abused: No    Physically Abused: No    Sexually Abused: No    Family History  Problem Relation Age of Onset   Heart disease Mother    Diabetes Mother    Esophageal cancer Father        81's   Colon cancer Neg Hx      Review of Systems  Constitutional: Negative.  Negative for chills and fever.  HENT: Negative.  Negative for congestion and sore throat.   Respiratory: Negative.  Negative for cough and shortness of breath.   Cardiovascular: Negative.  Negative for chest pain and palpitations.  Gastrointestinal:  Negative for abdominal pain, diarrhea, nausea and vomiting.  Genitourinary: Negative.   Skin: Negative.  Negative for rash.  Neurological:  Negative for dizziness and headaches.  All other systems reviewed and are negative.   Today's Vitals   11/26/21 1325   BP: 132/80  Pulse: 66  Temp: 98.3 F (36.8 C)  TempSrc: Oral  SpO2: 98%  Weight: 134 lb (60.8 kg)  Height: 5\' 5"  (1.651 m)   Body mass index is 22.3 kg/m.  Physical Exam Vitals reviewed.  Constitutional:      Appearance: Normal appearance.  HENT:     Head: Normocephalic.  Eyes:     Extraocular Movements: Extraocular movements intact.     Pupils: Pupils are equal, round, and reactive to light.  Cardiovascular:     Rate and Rhythm: Normal rate.  Pulmonary:     Effort:  Pulmonary effort is normal.  Musculoskeletal:     Comments: Left forearm: Mild proximal swelling.  No ecchymosis.  No erythema.  No significant tenderness.  Full range of motion at elbow and wrist levels.  Skin:    General: Skin is warm and dry.     Capillary Refill: Capillary refill takes less than 2 seconds.  Neurological:     General: No focal deficit present.     Mental Status: She is alert and oriented to person, place, and time.  Psychiatric:        Mood and Affect: Mood normal.        Behavior: Behavior normal.      ASSESSMENT & PLAN: A total of 35 minutes was spent with the patient and counseling/coordination of care regarding preparing for this visit, review of most recent office visit notes, review of chronic medical problems under treatment, review of all medications, differential diagnosis of left forearm pain, pain management, prognosis, documentation, and need for follow-up no better or worse during the next several days.  Problem List Items Addressed This Visit       Other   Musculoskeletal pain    Stable.  No red flag signs or symptoms.  Tylenol and or Advil for pain as needed.      Left forearm pain - Primary    Slowly improving.  No red flag signs or symptoms. No signs of cellulitis.  No hematoma formation. Most likely secondary to minor traumatic event. Advised to take Tylenol and or Advil as needed for pain. Contact the office if no better or worse during the next several  days.      Patient Instructions  Health Maintenance After Age 34 After age 51, you are at a higher risk for certain long-term diseases and infections as well as injuries from falls. Falls are a major cause of broken bones and head injuries in people who are older than age 35. Getting regular preventive care can help to keep you healthy and well. Preventive care includes getting regular testing and making lifestyle changes as recommended by your health care provider. Talk with your health care provider about: Which screenings and tests you should have. A screening is a test that checks for a disease when you have no symptoms. A diet and exercise plan that is right for you. What should I know about screenings and tests to prevent falls? Screening and testing are the best ways to find a health problem early. Early diagnosis and treatment give you the best chance of managing medical conditions that are common after age 18. Certain conditions and lifestyle choices may make you more likely to have a fall. Your health care provider may recommend: Regular vision checks. Poor vision and conditions such as cataracts can make you more likely to have a fall. If you wear glasses, make sure to get your prescription updated if your vision changes. Medicine review. Work with your health care provider to regularly review all of the medicines you are taking, including over-the-counter medicines. Ask your health care provider about any side effects that may make you more likely to have a fall. Tell your health care provider if any medicines that you take make you feel dizzy or sleepy. Strength and balance checks. Your health care provider may recommend certain tests to check your strength and balance while standing, walking, or changing positions. Foot health exam. Foot pain and numbness, as well as not wearing proper footwear, can make you more likely to have a  fall. Screenings, including: Osteoporosis screening.  Osteoporosis is a condition that causes the bones to get weaker and break more easily. Blood pressure screening. Blood pressure changes and medicines to control blood pressure can make you feel dizzy. Depression screening. You may be more likely to have a fall if you have a fear of falling, feel depressed, or feel unable to do activities that you used to do. Alcohol use screening. Using too much alcohol can affect your balance and may make you more likely to have a fall. Follow these instructions at home: Lifestyle Do not drink alcohol if: Your health care provider tells you not to drink. If you drink alcohol: Limit how much you have to: 0-1 drink a day for women. 0-2 drinks a day for men. Know how much alcohol is in your drink. In the [place] one drink equals one 12 oz bottle of beer (355 mL), one 5 oz glass of wine (148 mL), or one 1 oz glass of hard liquor (44 mL). Do not use any products that contain nicotine or tobacco. These products include cigarettes, chewing tobacco, and vaping devices, such as e-cigarettes. If you need help quitting, ask your health care provider. Activity  Follow a regular exercise program to stay fit. This will help you maintain your balance. Ask your health care provider what types of exercise are appropriate for you. If you need a cane or walker, use it as recommended by your health care provider. Wear supportive shoes that have nonskid soles. Safety  Remove any tripping hazards, such as rugs, cords, and clutter. Install safety equipment such as grab bars in bathrooms and safety rails on stairs. Keep rooms and walkways well-lit. General instructions Talk with your health care provider about your risks for falling. Tell your health care provider if: You fall. Be sure to tell your health care provider about all falls, even ones that seem minor. You feel dizzy, tiredness (fatigue), or off-balance. Take over-the-counter and prescription medicines only as told by  your health care provider. These include supplements. Eat a healthy diet and maintain a healthy weight. A healthy diet includes low-fat dairy products, low-fat (lean) meats, and fiber from whole grains, beans, and lots of fruits and vegetables. Stay current with your vaccines. Schedule regular health, dental, and eye exams. Summary Having a healthy lifestyle and getting preventive care can help to protect your health and wellness after age 66. Screening and testing are the best way to find a health problem early and help you avoid having a fall. Early diagnosis and treatment give you the best chance for managing medical conditions that are more common for people who are older than age 10065. Falls are a major cause of broken bones and head injuries in people who are older than age 66. Take precautions to prevent a fall at home. Work with your health care provider to learn what changes you can make to improve your health and wellness and to prevent falls. This information is not intended to replace advice given to you by your health care provider. Make sure you discuss any questions you have with your health care provider. Document Revised: 07/15/2020 Document Reviewed: 07/15/2020 Elsevier Patient Education  2023 Elsevier Inc.    Edwina BarthMiguel Analina Filla, MD [facility]

## 2021-11-26 NOTE — Assessment & Plan Note (Signed)
Stable.  No red flag signs or symptoms.  Tylenol and or Advil for pain as needed.

## 2021-11-26 NOTE — Assessment & Plan Note (Signed)
Slowly improving.  No red flag signs or symptoms. No signs of cellulitis.  No hematoma formation. Most likely secondary to minor traumatic event. Advised to take Tylenol and or Advil as needed for pain. Contact the office if no better or worse during the next several days.

## 2021-12-17 ENCOUNTER — Other Ambulatory Visit: Payer: Self-pay | Admitting: Emergency Medicine

## 2021-12-17 DIAGNOSIS — F419 Anxiety disorder, unspecified: Secondary | ICD-10-CM

## 2021-12-17 DIAGNOSIS — F418 Other specified anxiety disorders: Secondary | ICD-10-CM

## 2022-01-13 DIAGNOSIS — Z780 Asymptomatic menopausal state: Secondary | ICD-10-CM | POA: Diagnosis not present

## 2022-01-14 ENCOUNTER — Other Ambulatory Visit: Payer: Self-pay | Admitting: Emergency Medicine

## 2022-01-14 DIAGNOSIS — F32A Depression, unspecified: Secondary | ICD-10-CM

## 2022-02-17 ENCOUNTER — Other Ambulatory Visit: Payer: Self-pay | Admitting: Emergency Medicine

## 2022-02-17 ENCOUNTER — Encounter: Payer: Self-pay | Admitting: Emergency Medicine

## 2022-02-17 ENCOUNTER — Ambulatory Visit (INDEPENDENT_AMBULATORY_CARE_PROVIDER_SITE_OTHER): Payer: Medicare Other | Admitting: Emergency Medicine

## 2022-02-17 VITALS — BP 122/76 | HR 70 | Temp 98.3°F | Ht 65.0 in | Wt 133.0 lb

## 2022-02-17 DIAGNOSIS — I10 Essential (primary) hypertension: Secondary | ICD-10-CM

## 2022-02-17 DIAGNOSIS — F418 Other specified anxiety disorders: Secondary | ICD-10-CM

## 2022-02-17 DIAGNOSIS — M25562 Pain in left knee: Secondary | ICD-10-CM | POA: Diagnosis not present

## 2022-02-17 DIAGNOSIS — E785 Hyperlipidemia, unspecified: Secondary | ICD-10-CM

## 2022-02-17 DIAGNOSIS — F419 Anxiety disorder, unspecified: Secondary | ICD-10-CM

## 2022-02-17 DIAGNOSIS — M25462 Effusion, left knee: Secondary | ICD-10-CM | POA: Insufficient documentation

## 2022-02-17 DIAGNOSIS — N393 Stress incontinence (female) (male): Secondary | ICD-10-CM | POA: Diagnosis not present

## 2022-02-17 DIAGNOSIS — Z1211 Encounter for screening for malignant neoplasm of colon: Secondary | ICD-10-CM

## 2022-02-17 DIAGNOSIS — G8929 Other chronic pain: Secondary | ICD-10-CM | POA: Diagnosis not present

## 2022-02-17 DIAGNOSIS — R232 Flushing: Secondary | ICD-10-CM | POA: Diagnosis not present

## 2022-02-17 LAB — CBC WITH DIFFERENTIAL/PLATELET
Basophils Absolute: 0 10*3/uL (ref 0.0–0.1)
Basophils Relative: 0.5 % (ref 0.0–3.0)
Eosinophils Absolute: 0.1 10*3/uL (ref 0.0–0.7)
Eosinophils Relative: 1.5 % (ref 0.0–5.0)
HCT: 41.1 % (ref 36.0–46.0)
Hemoglobin: 13.7 g/dL (ref 12.0–15.0)
Lymphocytes Relative: 37.1 % (ref 12.0–46.0)
Lymphs Abs: 2.6 10*3/uL (ref 0.7–4.0)
MCHC: 33.4 g/dL (ref 30.0–36.0)
MCV: 87.1 fl (ref 78.0–100.0)
Monocytes Absolute: 0.7 10*3/uL (ref 0.1–1.0)
Monocytes Relative: 9.6 % (ref 3.0–12.0)
Neutro Abs: 3.6 10*3/uL (ref 1.4–7.7)
Neutrophils Relative %: 51.3 % (ref 43.0–77.0)
Platelets: 228 10*3/uL (ref 150.0–400.0)
RBC: 4.72 Mil/uL (ref 3.87–5.11)
RDW: 13.1 % (ref 11.5–15.5)
WBC: 7 10*3/uL (ref 4.0–10.5)

## 2022-02-17 LAB — LIPID PANEL
Cholesterol: 154 mg/dL (ref 0–200)
HDL: 69.8 mg/dL (ref >39.00)
LDL Cholesterol: 68 mg/dL (ref 0–99)
NonHDL: 84.22
Total CHOL/HDL Ratio: 2
Triglycerides: 82 mg/dL (ref 0.0–149.0)
VLDL: 16.4 mg/dL (ref 0.0–40.0)

## 2022-02-17 LAB — HEMOGLOBIN A1C: Hgb A1c MFr Bld: 5.8 % (ref 4.6–6.5)

## 2022-02-17 LAB — COMPREHENSIVE METABOLIC PANEL
ALT: 19 U/L (ref 0–35)
AST: 20 U/L (ref 0–37)
Albumin: 4.4 g/dL (ref 3.5–5.2)
Alkaline Phosphatase: 66 U/L (ref 39–117)
BUN: 16 mg/dL (ref 6–23)
CO2: 28 mEq/L (ref 19–32)
Calcium: 9.7 mg/dL (ref 8.4–10.5)
Chloride: 104 mEq/L (ref 96–112)
Creatinine, Ser: 0.7 mg/dL (ref 0.40–1.20)
GFR: 90.25 mL/min (ref >60.00)
Glucose, Bld: 99 mg/dL (ref 70–99)
Potassium: 4.1 mEq/L (ref 3.5–5.1)
Sodium: 138 mEq/L (ref 135–145)
Total Bilirubin: 0.4 mg/dL (ref 0.2–1.2)
Total Protein: 7.3 g/dL (ref 6.0–8.3)

## 2022-02-17 LAB — TSH: TSH: 0.65 u[IU]/mL (ref 0.35–5.50)

## 2022-02-17 NOTE — Assessment & Plan Note (Signed)
Postmenopausal symptoms. Requesting hormonal testing.

## 2022-02-17 NOTE — Assessment & Plan Note (Signed)
Stable.  Diet and nutrition discussed.  Continue rosuvastatin 10 mg daily.  

## 2022-02-17 NOTE — Assessment & Plan Note (Signed)
Stable exam.  Needs orthopedic evaluation. Referral placed today.

## 2022-02-17 NOTE — Assessment & Plan Note (Signed)
Well-controlled hypertension. Continue lisinopril 10 mg daily. BP Readings from Last 3 Encounters:  02/17/22 122/76  11/26/21 132/80  11/03/21 130/70  Cardiovascular risk associated with hypertension discussed. Dietary approaches to stop hypertension discussed.

## 2022-02-17 NOTE — Patient Instructions (Signed)
Health Maintenance After Age 65 After age 65, you are at a higher risk for certain long-term diseases and infections as well as injuries from falls. Falls are a major cause of broken bones and head injuries in people who are older than age 65. Getting regular preventive care can help to keep you healthy and well. Preventive care includes getting regular testing and making lifestyle changes as recommended by your health care provider. Talk with your health care provider about: Which screenings and tests you should have. A screening is a test that checks for a disease when you have no symptoms. A diet and exercise plan that is right for you. What should I know about screenings and tests to prevent falls? Screening and testing are the best ways to find a health problem early. Early diagnosis and treatment give you the best chance of managing medical conditions that are common after age 65. Certain conditions and lifestyle choices may make you more likely to have a fall. Your health care provider may recommend: Regular vision checks. Poor vision and conditions such as cataracts can make you more likely to have a fall. If you wear glasses, make sure to get your prescription updated if your vision changes. Medicine review. Work with your health care provider to regularly review all of the medicines you are taking, including over-the-counter medicines. Ask your health care provider about any side effects that may make you more likely to have a fall. Tell your health care provider if any medicines that you take make you feel dizzy or sleepy. Strength and balance checks. Your health care provider may recommend certain tests to check your strength and balance while standing, walking, or changing positions. Foot health exam. Foot pain and numbness, as well as not wearing proper footwear, can make you more likely to have a fall. Screenings, including: Osteoporosis screening. Osteoporosis is a condition that causes  the bones to get weaker and break more easily. Blood pressure screening. Blood pressure changes and medicines to control blood pressure can make you feel dizzy. Depression screening. You may be more likely to have a fall if you have a fear of falling, feel depressed, or feel unable to do activities that you used to do. Alcohol use screening. Using too much alcohol can affect your balance and may make you more likely to have a fall. Follow these instructions at home: Lifestyle Do not drink alcohol if: Your health care provider tells you not to drink. If you drink alcohol: Limit how much you have to: 0-1 drink a day for women. 0-2 drinks a day for men. Know how much alcohol is in your drink. In the [place] one drink equals one 12 oz bottle of beer (355 mL), one 5 oz glass of wine (148 mL), or one 1 oz glass of hard liquor (44 mL). Do not use any products that contain nicotine or tobacco. These products include cigarettes, chewing tobacco, and vaping devices, such as e-cigarettes. If you need help quitting, ask your health care provider. Activity  Follow a regular exercise program to stay fit. This will help you maintain your balance. Ask your health care provider what types of exercise are appropriate for you. If you need a cane or walker, use it as recommended by your health care provider. Wear supportive shoes that have nonskid soles. Safety  Remove any tripping hazards, such as rugs, cords, and clutter. Install safety equipment such as grab bars in bathrooms and safety rails on stairs. Keep rooms and walkways   well-lit. General instructions Talk with your health care provider about your risks for falling. Tell your health care provider if: You fall. Be sure to tell your health care provider about all falls, even ones that seem minor. You feel dizzy, tiredness (fatigue), or off-balance. Take over-the-counter and prescription medicines only as told by your health care provider. These include  supplements. Eat a healthy diet and maintain a healthy weight. A healthy diet includes low-fat dairy products, low-fat (lean) meats, and fiber from whole grains, beans, and lots of fruits and vegetables. Stay current with your vaccines. Schedule regular health, dental, and eye exams. Summary Having a healthy lifestyle and getting preventive care can help to protect your health and wellness after age 65. Screening and testing are the best way to find a health problem early and help you avoid having a fall. Early diagnosis and treatment give you the best chance for managing medical conditions that are more common for people who are older than age 65. Falls are a major cause of broken bones and head injuries in people who are older than age 65. Take precautions to prevent a fall at home. Work with your health care provider to learn what changes you can make to improve your health and wellness and to prevent falls. This information is not intended to replace advice given to you by your health care provider. Make sure you discuss any questions you have with your health care provider. Document Revised: 07/15/2020 Document Reviewed: 07/15/2020 Elsevier Patient Education  2023 Elsevier Inc.  

## 2022-02-17 NOTE — Assessment & Plan Note (Signed)
Active and affecting quality of life. Needs urology evaluation. Referral placed today.

## 2022-02-17 NOTE — Progress Notes (Signed)
Katie Gardner 66 y.o.   Chief Complaint  Patient presents with   Knee Pain    HISTORY OF PRESENT ILLNESS: This is a 66 y.o. female complaining of chronic pain to left knee.  Requesting orthopedic referral. Also complaining of stress incontinence Hot flashes Chronic anxiety History of hypertension and dyslipidemia Requesting blood work No other complaints or medical concerns today.  HPI   [name]   Medication Sig Start Date End Date Taking? Authorizing Provider  ALPRAZolam (XANAX) 0.5 MG tablet TAKE 1 TABLET(0.5 MG) BY MOUTH TWICE DAILY AS NEEDED FOR ANXIETY 02/17/22  Yes Keonta Monceaux, Eilleen Kempf, MD  cetirizine (ZYRTEC) 10 MG tablet TAKE 1 TABLET(10 MG) BY MOUTH DAILY 05/15/21  Yes Fabiana Dromgoole, Eilleen Kempf, MD  conjugated estrogens (PREMARIN) vaginal cream Place 1 Applicatorful vaginally daily. 07/19/19  Yes Jovanni Rash, Eilleen Kempf, MD  cyclobenzaprine (FLEXERIL) 5 MG tablet TAKE 1 TABLET(5 MG) BY MOUTH THREE TIMES DAILY AS NEEDED FOR MUSCLE SPASMS 09/16/21  Yes Georgina Quint, MD  FLUoxetine (PROZAC) 20 MG capsule TAKE 1 CAPSULE(20 MG) BY MOUTH DAILY 01/14/22  Yes Jemila Camille, Eilleen Kempf, MD  ibuprofen (ADVIL) 600 MG tablet TAKE 1 TABLET(600 MG) BY MOUTH EVERY 8 HOURS AS NEEDED FOR MODERATE PAIN 09/16/21  Yes Georgina Quint, MD  lisinopril (ZESTRIL) 10 MG tablet TAKE 1 TABLET(10 MG) BY MOUTH DAILY 11/15/21  Yes Shanikqua Zarzycki, Eilleen Kempf, MD  loratadine (CLARITIN) 10 MG tablet Take 1 tablet (10 mg total) by mouth daily. 09/12/18  Yes Lehman Whiteley, Eilleen Kempf, MD  rosuvastatin (CRESTOR) 10 MG tablet TAKE 1 TABLET(10 MG) BY MOUTH DAILY 04/01/21  Yes James Senn, Eilleen Kempf, MD  sulfamethoxazole-trimethoprim (BACTRIM DS) 800-160 MG tablet Take 1 tablet by mouth 2 (two) times daily. 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   Dyspareunia in female 09/13/2019   Skin tags, multiple acquired 07/27/2019   Chronic insomnia 05/31/2019   Essential hypertension 12/17/2017   Seasonal allergies 12/17/2017   Anxiety and depression 11/13/2016   Chronic anxiety 11/13/2016   Chronic sore throat 11/13/2016   Hot flashes    Situational anxiety    Dyslipidemia     Past Medical History:  Diagnosis Date   Allergy    seasonal   Anxiety    Depression    Hematuria    Hot flashes    Hx of cardiovascular stress test    Lex MV 12/13:  EF 68%, mild apical thinning, no ischemia   Hyperlipidemia     Past Surgical History:  Procedure Laterality Date   BREAST LUMPECTOMY     right breast, not cancer   Carpel tunnel     ECTOPIC PREGNANCY SURGERY      Social History   Socioeconomic History   Marital status: Legally Separated    Spouse name: Not on file   Number of children: 1   Years of education: 8th grade   Highest education level: Not on file  Occupational History    Employer: UNEMPLOYED  Tobacco Use   Smoking  status: Former   Smokeless tobacco: Never  Building services engineer Use: Never used  Substance and Sexual Activity   Alcohol use: No   Drug use: No   Sexual activity: Yes  Other Topics Concern   Not on file  Social History Narrative   Not on file   Social Determinants of Health   Financial Resource Strain: Low Risk  (12/26/2020)   Overall Financial Resource Strain (CARDIA)    Difficulty of Paying Living Expenses: Not hard at all  Food Insecurity: No Food Insecurity (12/26/2020)   Hunger Vital Sign    Worried About Running Out of Food in the Last Year: Never true    Ran Out of Food in the Last Year:  Never true  Transportation Needs: No Transportation Needs (12/26/2020)   PRAPARE - Administrator, Civil Service (Medical): No    Lack of Transportation (Non-Medical): No  Physical Activity: Inactive (12/26/2020)   Exercise Vital Sign    Days of Exercise per Week: 0 days    Minutes of Exercise per Session: 0 min  Stress: No Stress Concern Present (12/26/2020)   [employer] of Occupational Health - Occupational Stress Questionnaire    Feeling of Stress : Not at all  Social Connections: Socially Isolated (12/26/2020)   Social Connection and Isolation Panel [NHANES]    Frequency of Communication with Friends and Family: Once a week    Frequency of Social Gatherings with Friends and Family: Once a week    Attends Religious Services: Never    Database administrator or Organizations: No    Attends Banker Meetings: Never    Marital Status: Never married  Intimate Partner Violence: Not At Risk (12/26/2020)   Humiliation, Afraid, Rape, and Kick questionnaire    Fear of Current or Ex-Partner: No    Emotionally Abused: No    Physically Abused: No    Sexually Abused: No    Family History  Problem Relation Age of Onset   Heart disease Mother    Diabetes Mother    Esophageal cancer Father        47's   Colon cancer Neg Hx      Review of Systems  Constitutional: Negative.  Negative for chills and fever.  HENT: Negative.  Negative for congestion and sore throat.   Respiratory: Negative.  Negative for cough and shortness of breath.   Cardiovascular: Negative.  Negative for chest pain and palpitations.  Gastrointestinal:  Negative for abdominal pain, diarrhea, nausea and vomiting.  Genitourinary:        Stress urinary incontinence  Musculoskeletal:  Positive for joint pain (Left knee pain).  Skin: Negative.  Negative for rash.  Neurological:  Negative for dizziness and headaches.  All other systems reviewed and are negative.  Today's Vitals   02/17/22  1446  BP: 122/76  Pulse: 70  Temp: 98.3 F (36.8 C)  TempSrc: Oral  SpO2: 98%  Weight: 133 lb (60.3 kg)  Height: 5\' 5"  (1.651 m)   Body mass index is 22.13 kg/m.   Physical Exam Vitals reviewed.  Constitutional:      Appearance: Normal appearance.  HENT:     Head: Normocephalic.     Mouth/Throat:     Mouth: Mucous membranes are moist.     Pharynx: Oropharynx is clear.  Eyes:     Extraocular Movements: Extraocular movements intact.     Conjunctiva/sclera: Conjunctivae normal.     Pupils: Pupils are equal, round, and reactive  to light.  Cardiovascular:     Rate and Rhythm: Normal rate and regular rhythm.     Pulses: Normal pulses.     Heart sounds: Normal heart sounds.  Pulmonary:     Effort: Pulmonary effort is normal.     Breath sounds: Normal breath sounds.  Abdominal:     Palpations: Abdomen is soft.     Tenderness: There is no abdominal tenderness.  Musculoskeletal:     Cervical back: No tenderness.     Comments: Left knee: No erythema or swelling.  Full range of motion but complaining of pain.  No crepitation.  Stable in flexion and extension.  Lymphadenopathy:     Cervical: No cervical adenopathy.  Skin:    General: Skin is warm and dry.  Neurological:     Mental Status: She is alert and oriented to person, place, and time.  Psychiatric:        Mood and Affect: Mood normal.        Behavior: Behavior normal.      ASSESSMENT & PLAN: A total of 47 minutes was spent with the patient and counseling/coordination of care regarding preparing for this visit, review of most recent office visit notes, review of multiple chronic medical conditions under management, review of all medications, review of most recent blood work results, education on nutrition, prognosis, documentation, and need for follow-up.  Problem List Items Addressed This Visit       Cardiovascular and Mediastinum   Hot flashes    Postmenopausal symptoms. Requesting hormonal testing.       Relevant Orders   TSH   Hormone Panel   Essential hypertension    Well-controlled hypertension. Continue lisinopril 10 mg daily. BP Readings from Last 3 Encounters:  02/17/22 122/76  11/26/21 132/80  11/03/21 130/70  Cardiovascular risk associated with hypertension discussed. Dietary approaches to stop hypertension discussed.       Relevant Orders   CBC with Differential/Platelet   Comprehensive metabolic panel   Hemoglobin A1c   Hormone Panel     Other   Dyslipidemia    Stable.  Diet and nutrition discussed. Continue rosuvastatin 10 mg daily.      Relevant Orders   CBC with Differential/Platelet   Hemoglobin A1c   Lipid panel   Hormone Panel   Chronic anxiety    Stable and responding well to medication. Continue Prozac 20 mg daily and alprazolam 0.5 mg as needed      Relevant Orders   Hormone Panel   Chronic pain of left knee - Primary    Stable exam.  Needs orthopedic evaluation. Referral placed today.      Relevant Orders   Ambulatory referral to Orthopedic Surgery   Stress incontinence in female    Active and affecting quality of life. Needs urology evaluation. Referral placed today.      Relevant Orders   Ambulatory referral to Urology   Other Visit Diagnoses     Colon cancer screening       Relevant Orders   Cologuard         Patient Instructions  Health Maintenance After Age 48 After age 76, you are at a higher risk for certain long-term diseases and infections as well as injuries from falls. Falls are a major cause of broken bones and head injuries in people who are older than age 77. Getting regular preventive care can help to keep you healthy and well. Preventive care includes getting regular testing and making lifestyle changes as recommended  by your health care provider. Talk with your health care provider about: Which screenings and tests you should have. A screening is a test that checks for a disease when you have no symptoms. A  diet and exercise plan that is right for you. What should I know about screenings and tests to prevent falls? Screening and testing are the best ways to find a health problem early. Early diagnosis and treatment give you the best chance of managing medical conditions that are common after age 75. Certain conditions and lifestyle choices may make you more likely to have a fall. Your health care provider may recommend: Regular vision checks. Poor vision and conditions such as cataracts can make you more likely to have a fall. If you wear glasses, make sure to get your prescription updated if your vision changes. Medicine review. Work with your health care provider to regularly review all of the medicines you are taking, including over-the-counter medicines. Ask your health care provider about any side effects that may make you more likely to have a fall. Tell your health care provider if any medicines that you take make you feel dizzy or sleepy. Strength and balance checks. Your health care provider may recommend certain tests to check your strength and balance while standing, walking, or changing positions. Foot health exam. Foot pain and numbness, as well as not wearing proper footwear, can make you more likely to have a fall. Screenings, including: Osteoporosis screening. Osteoporosis is a condition that causes the bones to get weaker and break more easily. Blood pressure screening. Blood pressure changes and medicines to control blood pressure can make you feel dizzy. Depression screening. You may be more likely to have a fall if you have a fear of falling, feel depressed, or feel unable to do activities that you used to do. Alcohol use screening. Using too much alcohol can affect your balance and may make you more likely to have a fall. Follow these instructions at home: Lifestyle Do not drink alcohol if: Your health care provider tells you not to drink. If you drink alcohol: Limit how much you  have to: 0-1 drink a day for women. 0-2 drinks a day for men. Know how much alcohol is in your drink. In the [place] one drink equals one 12 oz bottle of beer (355 mL), one 5 oz glass of wine (148 mL), or one 1 oz glass of hard liquor (44 mL). Do not use any products that contain nicotine or tobacco. These products include cigarettes, chewing tobacco, and vaping devices, such as e-cigarettes. If you need help quitting, ask your health care provider. Activity  Follow a regular exercise program to stay fit. This will help you maintain your balance. Ask your health care provider what types of exercise are appropriate for you. If you need a cane or walker, use it as recommended by your health care provider. Wear supportive shoes that have nonskid soles. Safety  Remove any tripping hazards, such as rugs, cords, and clutter. Install safety equipment such as grab bars in bathrooms and safety rails on stairs. Keep rooms and walkways well-lit. General instructions Talk with your health care provider about your risks for falling. Tell your health care provider if: You fall. Be sure to tell your health care provider about all falls, even ones that seem minor. You feel dizzy, tiredness (fatigue), or off-balance. Take over-the-counter and prescription medicines only as told by your health care provider. These include supplements. Eat a healthy diet and maintain a  healthy weight. A healthy diet includes low-fat dairy products, low-fat (lean) meats, and fiber from whole grains, beans, and lots of fruits and vegetables. Stay current with your vaccines. Schedule regular health, dental, and eye exams. Summary Having a healthy lifestyle and getting preventive care can help to protect your health and wellness after age 66. Screening and testing are the best way to find a health problem early and help you avoid having a fall. Early diagnosis and treatment give you the best chance for managing medical conditions  that are more common for people who are older than age 66. Falls are a major cause of broken bones and head injuries in people who are older than age 66. Take precautions to prevent a fall at home. Work with your health care provider to learn what changes you can make to improve your health and wellness and to prevent falls. This information is not intended to replace advice given to you by your health care provider. Make sure you discuss any questions you have with your health care provider. Document Revised: 07/15/2020 Document Reviewed: 07/15/2020 Elsevier Patient Education  2023 Elsevier Inc.    Edwina BarthMiguel Allyne Hebert, MD [facility]

## 2022-02-17 NOTE — Assessment & Plan Note (Signed)
Stable and responding well to medication. Continue Prozac 20 mg daily and alprazolam 0.5 mg as needed

## 2022-02-19 ENCOUNTER — Telehealth: Payer: Self-pay | Admitting: Emergency Medicine

## 2022-02-19 NOTE — Telephone Encounter (Signed)
Called patient and left message for patient to call office in reference to her message about her lab results

## 2022-02-19 NOTE — Telephone Encounter (Signed)
None of the results are abnormal.  Thanks. Hormone panel still pending.

## 2022-02-19 NOTE — Telephone Encounter (Signed)
PT calls today in regards to recent lab results. PT stated she understood some of those levels showed high when checking her chart and she was wanting insight on how to proceed.  CB: [phone]

## 2022-02-19 NOTE — Telephone Encounter (Signed)
Patient has questions pertaining to her lab results. Please advise  

## 2022-02-24 LAB — SPECIMEN STATUS REPORT

## 2022-02-25 NOTE — Progress Notes (Signed)
Thanks

## 2022-02-25 NOTE — Progress Notes (Signed)
I believe this blood is for the hormone panel.  Please double check and verify

## 2022-03-05 ENCOUNTER — Ambulatory Visit (INDEPENDENT_AMBULATORY_CARE_PROVIDER_SITE_OTHER): Payer: Medicare Other | Admitting: Urology

## 2022-03-05 ENCOUNTER — Encounter: Payer: Self-pay | Admitting: Urology

## 2022-03-05 VITALS — BP 146/85 | HR 75 | Ht 60.0 in | Wt 133.0 lb

## 2022-03-05 DIAGNOSIS — N393 Stress incontinence (female) (male): Secondary | ICD-10-CM | POA: Diagnosis not present

## 2022-03-05 DIAGNOSIS — N3942 Incontinence without sensory awareness: Secondary | ICD-10-CM | POA: Diagnosis not present

## 2022-03-05 LAB — URINALYSIS
Bilirubin, UA: NEGATIVE
Glucose, UA: NEGATIVE mg/dL
Ketones, UA: NEGATIVE
Leukocytes, UA: NEGATIVE
Nitrite, UA: NEGATIVE
Protein, UA: NEGATIVE
Spec Grav, UA: 1.025 (ref 1.010–1.025)
Urobilinogen, UA: 0.2 E.U./dL
pH, UA: 5.5 (ref 5.0–8.0)

## 2022-03-05 LAB — BLADDER SCAN AMB NON-IMAGING

## 2022-03-05 NOTE — Progress Notes (Signed)
Assessment: 1. Stress incontinence in female   2. Urinary incontinence without sensory awareness     Plan: Diagnosis and management of stress incontinence discussed with the patient today.  Options for management including pelvic floor exercises, biofeedback, pessary use, and surgical management discussed. Instructions on pelvic floor exercises provided with recommendations. Diagnosis and management of overactive bladder and associated incontinence discussed.  Options for management including avoidance of dietary irritants, behavioral modification, medical therapy, neuromodulation, and chemodenervation discussed.  Patient information provided. She is not interested in any treatment of her overactive bladder and associated incontinence at this time. Return to office in 2 months.  Chief Complaint:  Chief Complaint  Patient presents with   Urinary Incontinence    History of Present Illness:  Katie Gardner is a 66 y.o. female who is seen in consultation from Georgina Quint, MD for evaluation of urinary incontinence.  He has a history of urinary incontinence for approximately 10 years.  He has had stress incontinence with leakage with coughing, using, and straining.  She has recently had several episodes of incontinence without awareness.  She reported leakage of urine while standing in the kitchen.  No urgency associated with this.  She does not have frequency, nocturia, or dysuria.  She does report occasional urgency.  No dysuria or gross hematuria.  She feels like she empties her bladder well.  She has not had any treatment for incontinence previously.  No history of UTIs.   Past Medical History:  Past Medical History:  Diagnosis Date   Allergy    seasonal   Anxiety    Depression    Hematuria    Hot flashes    Hx of cardiovascular stress test    Lex MV 12/13:  EF 68%, mild apical thinning, no ischemia   Hyperlipidemia     Past Surgical History:  Past Surgical History:   Procedure Laterality Date   BREAST LUMPECTOMY     right breast, not cancer   Carpel tunnel     ECTOPIC PREGNANCY SURGERY      Allergies:  Allergies  Allergen Reactions   Macrobid [Nitrofurantoin] Nausea And Vomiting   Hydrocodone Nausea And Vomiting    dizzy   Mucinex Dm [Dm-Guaifenesin Er] Swelling    Sob, throat closing    Family History:  Family History  Problem Relation Age of Onset   Heart disease Mother    Diabetes Mother    Esophageal cancer Father        42's   Colon cancer Neg Hx     Social History:  Social History   Tobacco Use   Smoking status: Former   Smokeless tobacco: Never  Building services engineer Use: Never used  Substance Use Topics   Alcohol use: No   Drug use: No    Review of symptoms:  Constitutional:  Negative for unexplained weight loss, night sweats, fever, chills ENT:  Negative for nose bleeds, sinus pain, painful swallowing CV:  Negative for chest pain, shortness of breath, exercise intolerance, palpitations, loss of consciousness Resp:  Negative for cough, wheezing, shortness of breath GI:  Negative for nausea, vomiting, diarrhea, bloody stools GU:  Positives noted in HPI; otherwise negative for gross hematuria, dysuria Neuro:  Negative for seizures, poor balance, limb weakness, slurred speech Psych:  Negative for lack of energy, depression, anxiety Endocrine:  Negative for polydipsia, polyuria, symptoms of hypoglycemia (dizziness, hunger, sweating) Hematologic:  Negative for anemia, purpura, petechia, prolonged or excessive bleeding, use of anticoagulants  Allergic:  Negative for difficulty breathing or choking as a result of exposure to anything; no shellfish allergy; no allergic response (rash/itch) to materials, foods  Physical exam: BP (!) 146/85   Pulse 75   Ht 5' (1.524 m)   Wt 133 lb (60.3 kg)   BMI 25.97 kg/m  GENERAL APPEARANCE:  Well appearing, well developed, well nourished, NAD HEENT: Atraumatic, Normocephalic,  oropharynx clear. NECK: Supple without lymphadenopathy or thyromegaly. LUNGS: Clear to auscultation bilaterally. HEART: Regular Rate and Rhythm without murmurs, gallops, or rubs. ABDOMEN: Soft, non-tender, No Masses. EXTREMITIES: Moves all extremities well.  Without clubbing, cyanosis, or edema. NEUROLOGIC:  Alert and oriented x 3, normal gait, CN II-XII grossly intact.  MENTAL STATUS:  Appropriate. BACK:  Non-tender to palpation.  No CVAT SKIN:  Warm, dry and intact.  GU: Urethra: no masses, no discharge, hypermobility, no leakage noted with cough or valsalva Vagina: normal mucosa, no lesions or discharge, grade 2 cystocele    Results: U/A dipstick: trace blood  PVR: 0 ml

## 2022-03-12 DIAGNOSIS — Z1231 Encounter for screening mammogram for malignant neoplasm of breast: Secondary | ICD-10-CM | POA: Diagnosis not present

## 2022-03-12 LAB — HM MAMMOGRAPHY

## 2022-03-13 ENCOUNTER — Ambulatory Visit (INDEPENDENT_AMBULATORY_CARE_PROVIDER_SITE_OTHER): Payer: Medicare Other | Admitting: Orthopaedic Surgery

## 2022-03-13 DIAGNOSIS — M2392 Unspecified internal derangement of left knee: Secondary | ICD-10-CM
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
## 2022-03-18 LAB — HORMONE PANEL (T4,TSH,FSH,TESTT,SHBG,DHEA,ETC)
DHEA-Sulfate, LCMS: 152 ug/dL — ABNORMAL HIGH
Estradiol, Serum, MS: 3.4 pg/mL
Estrone Sulfate: 46 ng/dL
Follicle Stimulating Hormone: 96 m[IU]/mL
Free T-3: 3.6 pg/mL
Free Testosterone, Serum: 1.2 pg/mL
Progesterone, Serum: <10 ng/dL
Sex Hormone Binding Globulin: 86.7 nmol/L
T4: 9.6 ug/dL
TSH: 0.69 uU/mL
Testosterone, Serum (Total): 20 ng/dL
Testosterone-% Free: 0.6 %
Triiodothyronine (T-3), Serum: 145 ng/dL

## 2022-03-19 ENCOUNTER — Other Ambulatory Visit: Payer: Self-pay | Admitting: Emergency Medicine

## 2022-03-19 DIAGNOSIS — F419 Anxiety disorder, unspecified: Secondary | ICD-10-CM

## 2022-03-19 DIAGNOSIS — F418 Other specified anxiety disorders: Secondary | ICD-10-CM

## 2022-03-25 ENCOUNTER — Other Ambulatory Visit: Payer: Self-pay | Admitting: Emergency Medicine

## 2022-03-25 ENCOUNTER — Encounter: Payer: Self-pay | Admitting: Emergency Medicine

## 2022-03-25 ENCOUNTER — Other Ambulatory Visit (HOSPITAL_BASED_OUTPATIENT_CLINIC_OR_DEPARTMENT_OTHER): Payer: Self-pay | Admitting: Orthopaedic Surgery

## 2022-03-25 ENCOUNTER — Telehealth: Payer: Self-pay | Admitting: Emergency Medicine

## 2022-03-25 ENCOUNTER — Telehealth: Payer: Self-pay | Admitting: Orthopaedic Surgery

## 2022-03-25 MED ORDER — LORAZEPAM 1 MG PO TABS: 1.0000 mg | ORAL_TABLET | Freq: Three times a day (TID) | ORAL | 0 refills | Status: DC

## 2022-03-25 MED ORDER — LORAZEPAM 1 MG PO TABS: ORAL_TABLET | ORAL | 0 refills | Status: DC

## 2022-03-25 NOTE — Telephone Encounter (Signed)
Patients want something to relax her for her MRI. Please Advise.Marland Kitchen

## 2022-03-25 NOTE — Telephone Encounter (Signed)
Patient called and said that she has an MRI Friday 03/27/22 and wanted to know if Dr Dara Lords can send her something in to help her relax for it. Call back is [phone]

## 2022-03-25 NOTE — Telephone Encounter (Signed)
New prescription for Ativan sent to pharmacy of record today.

## 2022-03-25 NOTE — Telephone Encounter (Signed)
Called patient to inform her that medication was sent to her pharmacy.

## 2022-03-27 ENCOUNTER — Ambulatory Visit (HOSPITAL_COMMUNITY)
Admission: RE | Admit: 2022-03-27 | Discharge: 2022-03-27 | Disposition: A | Discharge status: Home / Self Care | Payer: 59 | Source: Ambulatory Visit | Attending: Orthopaedic Surgery | Admitting: Orthopaedic Surgery

## 2022-03-27 DIAGNOSIS — M2392 Unspecified internal derangement of left knee: Secondary | ICD-10-CM | POA: Insufficient documentation

## 2022-03-27 DIAGNOSIS — M7122 Synovial cyst of popliteal space [Baker], left knee: Secondary | ICD-10-CM | POA: Diagnosis not present

## 2022-03-27 DIAGNOSIS — M1712 Unilateral primary osteoarthritis, left knee: Secondary | ICD-10-CM | POA: Diagnosis not present

## 2022-03-27 DIAGNOSIS — M23222 Derangement of posterior horn of medial meniscus due to old tear or injury, left knee: Secondary | ICD-10-CM | POA: Diagnosis not present

## 2022-03-27 DIAGNOSIS — M25462 Effusion, left knee: Secondary | ICD-10-CM | POA: Diagnosis not present

## 2022-04-10 ENCOUNTER — Ambulatory Visit (HOSPITAL_BASED_OUTPATIENT_CLINIC_OR_DEPARTMENT_OTHER): Payer: Self-pay | Admitting: Orthopaedic Surgery

## 2022-04-10 ENCOUNTER — Telehealth: Payer: Self-pay | Admitting: Orthopaedic Surgery

## 2022-04-10 ENCOUNTER — Ambulatory Visit (INDEPENDENT_AMBULATORY_CARE_PROVIDER_SITE_OTHER): Payer: 59 | Admitting: Orthopaedic Surgery

## 2022-04-10 DIAGNOSIS — M2392 Unspecified internal derangement of left knee: Secondary | ICD-10-CM | POA: Diagnosis not present

## 2022-04-10 DIAGNOSIS — S83207A Unspecified tear of unspecified meniscus, current injury, left knee, initial encounter: Secondary | ICD-10-CM

## 2022-04-10 NOTE — Telephone Encounter (Signed)
Surgery sheet sent to [facility] 
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
## 2022-04-10 NOTE — Telephone Encounter (Signed)
Patient wants to do surgery left knee. Please contact patient [phone]

## 2022-04-13 ENCOUNTER — Telehealth: Payer: Self-pay | Admitting: Emergency Medicine

## 2022-04-13 NOTE — Telephone Encounter (Signed)
PRe op forms received by CMA, put in provider box

## 2022-04-13 NOTE — Telephone Encounter (Signed)
For our records:  We have received Pre-Op PW for the pt and it has been placed in Dr. Barry Brunner boxes.   Upon completion please fax to:  [phone]

## 2022-04-14 NOTE — Telephone Encounter (Signed)
Preop forms completed and faxed, left message for patient informing her that her forms are completed

## 2022-04-15 ENCOUNTER — Other Ambulatory Visit: Payer: Self-pay | Admitting: Emergency Medicine

## 2022-04-15 DIAGNOSIS — F32A Depression, unspecified: Secondary | ICD-10-CM

## 2022-05-04 ENCOUNTER — Ambulatory Visit (INDEPENDENT_AMBULATORY_CARE_PROVIDER_SITE_OTHER): Payer: 59 | Admitting: Emergency Medicine

## 2022-05-04 ENCOUNTER — Encounter (HOSPITAL_BASED_OUTPATIENT_CLINIC_OR_DEPARTMENT_OTHER): Payer: Self-pay | Admitting: Orthopaedic Surgery

## 2022-05-04 ENCOUNTER — Telehealth: Payer: Self-pay | Admitting: Emergency Medicine

## 2022-05-04 ENCOUNTER — Telehealth: Payer: Self-pay | Admitting: *Deleted

## 2022-05-04 ENCOUNTER — Other Ambulatory Visit: Payer: Self-pay

## 2022-05-04 ENCOUNTER — Encounter: Payer: Self-pay | Admitting: Emergency Medicine

## 2022-05-04 ENCOUNTER — Telehealth: Payer: Self-pay

## 2022-05-04 VITALS — BP 136/80 | HR 71 | Temp 98.7°F | Ht 60.0 in | Wt 134.2 lb

## 2022-05-04 DIAGNOSIS — F32A Depression, unspecified: Secondary | ICD-10-CM

## 2022-05-04 DIAGNOSIS — I10 Essential (primary) hypertension: Secondary | ICD-10-CM | POA: Diagnosis not present

## 2022-05-04 DIAGNOSIS — M79602 Pain in left arm: Secondary | ICD-10-CM

## 2022-05-04 DIAGNOSIS — F419 Anxiety disorder, unspecified: Secondary | ICD-10-CM | POA: Diagnosis not present

## 2022-05-04 DIAGNOSIS — E785 Hyperlipidemia, unspecified: Secondary | ICD-10-CM

## 2022-05-04 MED ORDER — FLUOXETINE HCL 10 MG PO TABS: 10.0000 mg | ORAL_TABLET | Freq: Every day | ORAL | 3 refills | Status: DC

## 2022-05-04 NOTE — Telephone Encounter (Signed)
LMOM to return call to answer questions

## 2022-05-04 NOTE — Assessment & Plan Note (Signed)
Chronic stable condition.  Continue rosuvastatin 10 mg daily.

## 2022-05-04 NOTE — Progress Notes (Signed)
[place] 67 y.o.   Chief Complaint  Patient presents with   Acute Visit    Pain in left arm, off and on swelling, patient experiencing some depression, anxiety     HISTORY OF PRESENT ILLNESS: This is a 67 y.o. female complaining of feeling chronically depressed and anxious for the past couple years. Requesting Prozac to be decreased to 10 mg daily. Takes alprazolam almost daily Complaining of occasional pain and swelling to the left proximal forearm lasting 1 to 2 minutes and going away spontaneously. Requesting letter for service "anxiety" dog. No other complaints or medical concerns today.  HPI   [name]   Medication Sig Start Date End Date Taking? Authorizing Provider  ALPRAZolam (XANAX) 0.5 MG tablet TAKE 1 TABLET(0.5 MG) BY MOUTH TWICE DAILY AS NEEDED FOR ANXIETY 03/19/22  Yes Delcie Ruppert, Ines Bloomer, MD  cetirizine (ZYRTEC) 10 MG tablet TAKE 1 TABLET(10 MG) BY MOUTH DAILY 05/15/21  Yes Alisha Burgo, Ines Bloomer, MD  cyclobenzaprine (FLEXERIL) 5 MG tablet TAKE 1 TABLET(5 MG) BY MOUTH THREE TIMES DAILY AS NEEDED FOR MUSCLE SPASMS 09/16/21  Yes Horald Pollen, MD  FLUoxetine (PROZAC) 20 MG capsule TAKE 1 CAPSULE(20 MG) BY MOUTH DAILY 04/15/22  Yes Somaly Marteney, Ines Bloomer, MD  ibuprofen (ADVIL) 600 MG tablet TAKE 1 TABLET(600 MG) BY MOUTH EVERY 8 HOURS AS NEEDED FOR MODERATE PAIN 09/16/21  Yes Horald Pollen, MD  lisinopril (ZESTRIL) 10 MG tablet TAKE 1 TABLET(10 MG) BY MOUTH DAILY 11/15/21  Yes Kittie Krizan, Ines Bloomer, MD  loratadine (CLARITIN) 10 MG tablet Take 1 tablet (10 mg total) by mouth daily. 09/12/18  Yes Daijha Leggio, Ines Bloomer, MD  LORazepam (ATIVAN) 1 MG tablet Sig 1 mg tablet 1 hour before procedure 03/25/22  Yes Blake Vetrano, Ines Bloomer, MD  LORazepam (ATIVAN) 1 MG tablet Take 1 tablet (1 mg total) by mouth every 8 (eight) hours. 03/25/22  Yes Vanetta Mulders, MD  rosuvastatin (CRESTOR) 10 MG tablet TAKE 1 TABLET(10 MG) BY MOUTH DAILY 04/01/21  Yes  Andee Chivers, Ines Bloomer, MD    Allergies  Allergen Reactions   Macrobid [Nitrofurantoin] Nausea And Vomiting   Hydrocodone Nausea And Vomiting    dizzy   Mucinex Dm [Dm-Guaifenesin Er] Swelling    Sob, throat closing    Patient Active Problem List   Diagnosis Date Noted   Urinary incontinence without sensory awareness 03/05/2022   Chronic pain of left knee 02/17/2022   Stress incontinence in female 02/17/2022   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   Dyspareunia in female 09/13/2019   Skin tags, multiple acquired 07/27/2019   Chronic insomnia 05/31/2019   Essential hypertension 12/17/2017   Seasonal allergies 12/17/2017   Anxiety and depression 11/13/2016   Chronic anxiety 11/13/2016   Chronic sore throat 11/13/2016   Hot flashes    Situational anxiety    Dyslipidemia     Past Medical History:  Diagnosis Date   Allergy    seasonal   Anxiety    Depression    Family history of adverse reaction to anesthesia    sister hard to wake up   Hematuria    Hot flashes    Hx of cardiovascular stress test    Lex MV 12/13:  EF 68%, mild apical thinning, no ischemia   Hyperlipidemia    Hypertension    PONV (postoperative nausea and vomiting)     Past Surgical History:  Procedure Laterality Date   BREAST LUMPECTOMY     right breast, not cancer   Carpel tunnel  ECTOPIC PREGNANCY SURGERY      Social History   Socioeconomic History   Marital status: Legally Separated    Spouse name: Not on file   Number of children: 1   Years of education: 8th grade   Highest education level: Not on file  Occupational History    Employer: UNEMPLOYED  Tobacco Use   Smoking status: Former   Smokeless tobacco: Never  Scientific laboratory technician Use: Never used  Substance and Sexual Activity   Alcohol use: No   Drug use: No   Sexual activity: Yes  Other Topics Concern   Not on file  Social History Narrative   Not on file   Social Determinants of Health   Financial Resource  Strain: Low Risk  (12/26/2020)   Overall Financial Resource Strain (CARDIA)    Difficulty of Paying Living Expenses: Not hard at all  Food Insecurity: No Food Insecurity (12/26/2020)   Hunger Vital Sign    Worried About Running Out of Food in the Last Year: Never true    [place] in the Last Year: Never true  Transportation Needs: No Transportation Needs (12/26/2020)   PRAPARE - Hydrologist (Medical): No    Lack of Transportation (Non-Medical): No  Physical Activity: Inactive (12/26/2020)   Exercise Vital Sign    Days of Exercise per Week: 0 days    Minutes of Exercise per Session: 0 min  Stress: No Stress Concern Present (12/26/2020)   [place]    Feeling of Stress : Not at all  Social Connections: Socially Isolated (12/26/2020)   Social Connection and Isolation Panel [NHANES]    Frequency of Communication with Friends and Family: Once a week    Frequency of Social Gatherings with Friends and Family: Once a week    Attends Religious Services: Never    Marine scientist or Organizations: No    Attends Archivist Meetings: Never    Marital Status: Never married  Intimate Partner Violence: Not At Risk (12/26/2020)   Humiliation, Afraid, Rape, and Kick questionnaire    Fear of Current or Ex-Partner: No    Emotionally Abused: No    Physically Abused: No    Sexually Abused: No    Family History  Problem Relation Age of Onset   Heart disease Mother    Diabetes Mother    Esophageal cancer Father        64's   Colon cancer Neg Hx      Review of Systems  Constitutional: Negative.  Negative for chills and fever.  HENT: Negative.  Negative for congestion and sore throat.   Respiratory: Negative.  Negative for cough and shortness of breath.   Cardiovascular: Negative.  Negative for chest pain and palpitations.  Gastrointestinal: Negative.  Negative for abdominal  pain, diarrhea, nausea and vomiting.  Genitourinary: Negative.  Negative for dysuria and hematuria.  Skin: Negative.  Negative for rash.  Neurological: Negative.  Negative for dizziness and headaches.  Psychiatric/Behavioral:  Positive for depression. The patient is nervous/anxious.   All other systems reviewed and are negative.  Today's Vitals   05/04/22 1407  BP: 136/80  Pulse: 71  Temp: 98.7 F (37.1 C)  TempSrc: Oral  SpO2: 97%  Weight: 134 lb 4 oz (60.9 kg)  Height: 5' (1.524 m)   Body mass index is 26.22 kg/m.   Physical Exam Vitals reviewed.  Constitutional:  Appearance: Normal appearance.  HENT:     Head: Normocephalic.  Eyes:     Extraocular Movements: Extraocular movements intact.     Pupils: Pupils are equal, round, and reactive to light.  Cardiovascular:     Rate and Rhythm: Normal rate and regular rhythm.     Pulses: Normal pulses.     Heart sounds: Normal heart sounds.  Pulmonary:     Effort: Pulmonary effort is normal.     Breath sounds: Normal breath sounds.  Musculoskeletal:     Cervical back: No tenderness.     Comments: Left upper extremity: Full range of motion.  No abnormal findings.  Lymphadenopathy:     Cervical: No cervical adenopathy.  Skin:    General: Skin is warm and dry.  Neurological:     General: No focal deficit present.     Mental Status: She is alert and oriented to person, place, and time.  Psychiatric:        Mood and Affect: Mood normal.        Behavior: Behavior normal.      ASSESSMENT & PLAN: A total of 45 minutes was spent with the patient and counseling/coordination of care regarding preparing for this visit, review of most recent office visit notes, review of chronic medical conditions and their management, review of all medications, diagnosis of chronic anxiety and depression and need for psychiatric evaluation, education on nutrition, prognosis, documentation, and need for follow-up.  Problem List Items  Addressed This Visit       Cardiovascular and Mediastinum   Essential hypertension    Well-controlled hypertension. Continue lisinopril 10 mg daily. BP Readings from Last 3 Encounters:  05/04/22 136/80  03/05/22 (!) 146/85  02/17/22 122/76          Other   Dyslipidemia    Chronic stable condition.  Continue rosuvastatin 10 mg daily.      Anxiety and depression    Active and affecting quality of life. Has been a chronic problem.  Combination of both conditions Takes Prozac erratically. Requesting to have dose decreased to 10 mg daily due to headaches. Needs psychiatric evaluation. Referral placed today.      Relevant Medications   FLUoxetine (PROZAC) 10 MG tablet   Other Relevant Orders   Ambulatory referral to Psychiatry   Chronic anxiety - Primary    Presently active and affecting quality of life. Takes alprazolam as needed but almost daily Needs psychiatric evaluation Referral placed today      Relevant Medications   FLUoxetine (PROZAC) 10 MG tablet   Other Relevant Orders   Ambulatory referral to Psychiatry   Arm pain, musculoskeletal, left    No abnormal findings on physical exam. Most likely muscle spasm that comes and goes.      Patient Instructions  Major Depressive Disorder, Adult Major depressive disorder (MDD) is a mental health condition. People with this disorder feel very sad, hopeless, and lose interest in things. Symptoms last most of the day, almost every day, for 2 weeks. MDD can affect: Relationships. Work and school. Things you usually like to do. What are the causes? The cause of MDD is not known. What increases the risk? Having family members with depression. Being female. Family problems. Alcohol or drug misuse. A lot of stress in your life, such as from: Living without basic needs such as food and housing. Being treated poorly because of race, sex, or religion (discrimination). Things that caused you pain as a child, especially  if you lost  a parent or were abused. Health and mental problems that you have had for a long time. What are the signs or symptoms? The main symptoms of this condition are: Being sad all the time. Being grouchy (irritable) all the time. Not enjoying the things you usually like. Sleeping too much or too little. Eating too much or too little. Feeling tired. Other symptoms include: Gaining or losing weight, without knowing why. Being restless and weak. Feeling hopeless, worthless, or guilty. Trouble thinking or making decisions. Thoughts of hurting yourself or others, or thoughts of ending your life. Spending a lot of time alone. Being unable to do daily tasks. If you have very bad MDD, you may: Believe things that are not true. Hear, see, taste, or feel things that are not there. Have mild depression that lasts for at least 2 years. Feel very sad and hopeless. Have trouble speaking or moving. Feel very sad during some seasons. How is this treated? Talk therapy. This teaches you about thoughts, feelings, and actions and how to change them. This can also help you to talk with others. This can be done with members of your family. Medicines. Lifestyle changes. You may need to: Limit alcohol use. Stop using drugs, if you use them. Exercise. Get plenty of sleep. Eat healthy. Spend more time outdoors. Brain stimulation. This may be done when symptoms are very bad or have not gotten better. Follow these instructions at home: Alcohol use Do not drink alcohol if: Your health care provider tells you not to drink. You are pregnant, may be pregnant, or are planning to become pregnant. If you drink alcohol: Limit how much you use to: 0-1 drink a day for women. 0-2 drinks a day for men. Know how much alcohol is in your drink. In the [place] one drink equals one 12 oz bottle of beer (355 mL), one 5 oz glass of wine (148 mL), or one 1 oz glass of hard liquor (44 mL). Activity Exercise as  told by your doctor. Spend time outdoors. Make time to do the things you enjoy. Find ways to deal with stress. Try to: Meditate. Do deep breathing. Spend time in nature. Keep a journal. Return to your normal activities when your doctor says that it is safe. General instructions  Take over-the-counter and prescription medicines only as told by your doctor. Talk to your doctor about: Alcohol use. It can affect medicines. Any drug use. Eat healthy foods. Get a lot of sleep. Think about joining a support group. Ask your doctor about that. Keep all follow-up visits. Your doctor will need to check on your mood, behavior, and medicines, and change your treatment as needed. Where to find more information: [employer] on Mental Illness: nami.[employer] of Mental Health: https://www.frey.org/ American Psychiatric Association: psychiatry.org Contact a doctor if: You feel worse. You get new symptoms. Get help right away if: You hurt yourself on purpose (self-harm). You have thoughts about hurting yourself or others. You see, hear, taste, smell, or feel things that are not there. Get help right away if you feel like you may hurt yourself or others, or have thoughts about taking your own life. Go to your nearest emergency room or: Call 911. Call the [place] at 6095426039 or 988. This is open 24 hours a day. Text the Crisis Text Line at (901)177-7892. This information is not intended to replace advice given to you by your health care provider. Make sure you discuss any questions you have with your  health care provider. Document Revised: 07/01/2021 Document Reviewed: 07/01/2021 Elsevier Patient Education  [place], MD [facility]

## 2022-05-04 NOTE — Assessment & Plan Note (Signed)
Presently active and affecting quality of life. Takes alprazolam as needed but almost daily Needs psychiatric evaluation Referral placed today

## 2022-05-04 NOTE — Telephone Encounter (Signed)
Patient requesting home health aide following surgery.  Referral information faxed to [place]

## 2022-05-04 NOTE — Assessment & Plan Note (Signed)
Well-controlled hypertension. Continue lisinopril 10 mg daily. BP Readings from Last 3 Encounters:  05/04/22 136/80  03/05/22 (!) 146/85  02/17/22 122/76

## 2022-05-04 NOTE — Telephone Encounter (Signed)
PT visits post visit and was inquiring about possible home assistance/care giver options for after her surgery. PT is scheduled for knee surgery 03/05 (did state she might end up having to push it out) with post op 03/13. Would we be able to proactively get her set up with assistance for after the surgery?  CB if needed: [phone]

## 2022-05-04 NOTE — Telephone Encounter (Signed)
Patient was seen in office today. Please advise on recommendation letter for her to have a service dog for anxiety. Please advise on letter

## 2022-05-04 NOTE — Telephone Encounter (Signed)
Called patient in reference to message. I informed patient that the orthopedic office will have to do [facility] orders for when she is d/c after he surgery. I called the orthopedic office and left a message for Dr. Sammuel Hines in reference to patient concern about needing HH. They will contact the patient

## 2022-05-04 NOTE — Telephone Encounter (Signed)
Patient has chronic anxiety and depression.  Will benefit from service dog.  Okay to write letter to that effect.

## 2022-05-04 NOTE — Telephone Encounter (Signed)
Patient's PCP called the triage line. She is scheduled for a left knee scope with Dr.Bokshan on 05/12/2022. Patient was requesting for her PCP office to order HHPT. They are not ordering this. Please call and advise patient on next steps following surgery, and if she will be needing HHPT or not.

## 2022-05-04 NOTE — Telephone Encounter (Signed)
Recommendation letter completed, awaiting MD signature, will mail it to patient address on signed.

## 2022-05-04 NOTE — Assessment & Plan Note (Signed)
No abnormal findings on physical exam. Most likely muscle spasm that comes and goes.

## 2022-05-04 NOTE — Assessment & Plan Note (Signed)
Active and affecting quality of life. Has been a chronic problem.  Combination of both conditions Takes Prozac erratically. Requesting to have dose decreased to 10 mg daily due to headaches. Needs psychiatric evaluation. Referral placed today.

## 2022-05-04 NOTE — Patient Instructions (Signed)
Major Depressive Disorder, Adult Major depressive disorder (MDD) is a mental health condition. People with this disorder feel very sad, hopeless, and lose interest in things. Symptoms last most of the day, almost every day, for 2 weeks. MDD can affect: Relationships. Work and school. Things you usually like to do. What are the causes? The cause of MDD is not known. What increases the risk? Having family members with depression. Being female. Family problems. Alcohol or drug misuse. A lot of stress in your life, such as from: Living without basic needs such as food and housing. Being treated poorly because of race, sex, or religion (discrimination). Things that caused you pain as a child, especially if you lost a parent or were abused. Health and mental problems that you have had for a long time. What are the signs or symptoms? The main symptoms of this condition are: Being sad all the time. Being grouchy (irritable) all the time. Not enjoying the things you usually like. Sleeping too much or too little. Eating too much or too little. Feeling tired. Other symptoms include: Gaining or losing weight, without knowing why. Being restless and weak. Feeling hopeless, worthless, or guilty. Trouble thinking or making decisions. Thoughts of hurting yourself or others, or thoughts of ending your life. Spending a lot of time alone. Being unable to do daily tasks. If you have very bad MDD, you may: Believe things that are not true. Hear, see, taste, or feel things that are not there. Have mild depression that lasts for at least 2 years. Feel very sad and hopeless. Have trouble speaking or moving. Feel very sad during some seasons. How is this treated? Talk therapy. This teaches you about thoughts, feelings, and actions and how to change them. This can also help you to talk with others. This can be done with members of your family. Medicines. Lifestyle changes. You may need to: Limit  alcohol use. Stop using drugs, if you use them. Exercise. Get plenty of sleep. Eat healthy. Spend more time outdoors. Brain stimulation. This may be done when symptoms are very bad or have not gotten better. Follow these instructions at home: Alcohol use Do not drink alcohol if: Your health care provider tells you not to drink. You are pregnant, may be pregnant, or are planning to become pregnant. If you drink alcohol: Limit how much you use to: 0-1 drink a day for women. 0-2 drinks a day for men. Know how much alcohol is in your drink. In the [place] one drink equals one 12 oz bottle of beer (355 mL), one 5 oz glass of wine (148 mL), or one 1 oz glass of hard liquor (44 mL). Activity Exercise as told by your doctor. Spend time outdoors. Make time to do the things you enjoy. Find ways to deal with stress. Try to: Meditate. Do deep breathing. Spend time in nature. Keep a journal. Return to your normal activities when your doctor says that it is safe. General instructions  Take over-the-counter and prescription medicines only as told by your doctor. Talk to your doctor about: Alcohol use. It can affect medicines. Any drug use. Eat healthy foods. Get a lot of sleep. Think about joining a support group. Ask your doctor about that. Keep all follow-up visits. Your doctor will need to check on your mood, behavior, and medicines, and change your treatment as needed. Where to find more information: [employer] on Mental Illness: nami[employer] of Mental Health: https://www.frey.org/ American Psychiatric Association: psychiatry.org Contact a doctor  if: You feel worse. You get new symptoms. Get help right away if: You hurt yourself on purpose (self-harm). You have thoughts about hurting yourself or others. You see, hear, taste, smell, or feel things that are not there. Get help right away if you feel like you may hurt yourself or others, or have thoughts about taking  your own life. Go to your nearest emergency room or: Call 911. Call the [place] at 404-642-4031 or 988. This is open 24 hours a day. Text the Crisis Text Line at 614-533-8154. This information is not intended to replace advice given to you by your health care provider. Make sure you discuss any questions you have with your health care provider. Document Revised: 07/01/2021 Document Reviewed: 07/01/2021 Elsevier Patient Education  [place].

## 2022-05-05 ENCOUNTER — Telehealth: Payer: Self-pay | Admitting: Orthopaedic Surgery

## 2022-05-05 NOTE — Telephone Encounter (Signed)
Recommendation letter signed and mailed to patient address as requested

## 2022-05-05 NOTE — Telephone Encounter (Signed)
Note uploaded to [facility] and replied to patient via [place]

## 2022-05-05 NOTE — Telephone Encounter (Signed)
Patient called stating her niece will be taking care of her for three days total after surgery. Patient would like to have Home Health come to check on her after niece leaves. Can doctor put in an order for [facility]? Please advise patient.

## 2022-05-06 ENCOUNTER — Ambulatory Visit: Payer: Medicare Other | Admitting: Urology

## 2022-05-07 ENCOUNTER — Telehealth: Payer: Self-pay | Admitting: Emergency Medicine

## 2022-05-07 ENCOUNTER — Other Ambulatory Visit: Payer: Self-pay | Admitting: Emergency Medicine

## 2022-05-07 ENCOUNTER — Telehealth: Payer: Self-pay

## 2022-05-07 DIAGNOSIS — F419 Anxiety disorder, unspecified: Secondary | ICD-10-CM

## 2022-05-07 DIAGNOSIS — F418 Other specified anxiety disorders: Secondary | ICD-10-CM

## 2022-05-07 NOTE — Telephone Encounter (Signed)
AVS mailed to patient address on file

## 2022-05-07 NOTE — Telephone Encounter (Signed)
Called patient to schedule Medicare Annual Wellness Visit (AWV). Unable to reach patient.  Last date of AWV: 12/26/20  Please schedule an appointment at any time with [facility] or [facility].   Norton Blizzard, [place] (AAMA)  [address] [phone]

## 2022-05-07 NOTE — Telephone Encounter (Signed)
New prescription sent to patient pharmacy today.

## 2022-05-07 NOTE — Telephone Encounter (Signed)
Patient would like to know if her after visit summary from her visit on 05/04/22 mailed to her home at [place] [address]. She said she lost it and has no transportation to come pick it up. If not able to mail, please call patient at [phone].

## 2022-05-07 NOTE — Telephone Encounter (Signed)
Patient called and said she needs a letter from Dr. Mitchel Honour authorizing home health care with [facility] sent to [employer]. She needs the home care due to her having surgery. She will need them for after surgery. She said they need it sent ASAP for it to get approved. Best callback for patient is [phone]

## 2022-05-07 NOTE — Telephone Encounter (Signed)
MEDICATION:ALPRAZolam (XANAX) 0.5 MG tablet   PHARMACY:[facility] [place],  - [address] AT [address]   Comments: Patient said pharmacy hasn't received prescription  **Let patient know to contact pharmacy at the end of the day to make sure medication is ready. **  ** Please notify patient to allow 48-72 hours to process**  **Encourage patient to contact the pharmacy for refills or they can request refills through [facility]**

## 2022-05-08 NOTE — Telephone Encounter (Signed)
Called patient and informed her the the [facility] orders will need to come from her Orthopedic office as we would not know the [facility] needs she would need once she is d/c's after surgery. I called the orthopedic office to inform them that the patient is requesting [facility] services after surgery they will contact patient with this information

## 2022-05-11 ENCOUNTER — Other Ambulatory Visit (HOSPITAL_BASED_OUTPATIENT_CLINIC_OR_DEPARTMENT_OTHER): Payer: Self-pay | Admitting: Orthopaedic Surgery

## 2022-05-11 ENCOUNTER — Telehealth: Payer: Self-pay | Admitting: Orthopaedic Surgery

## 2022-05-11 DIAGNOSIS — S83207A Unspecified tear of unspecified meniscus, current injury, left knee, initial encounter: Secondary | ICD-10-CM

## 2022-05-11 NOTE — Telephone Encounter (Signed)
Home health ordered and printed for patient to pick up at [facility] front desk.

## 2022-05-11 NOTE — Telephone Encounter (Signed)
Patient returned call stating she can not pick up referral form from office. Will mail referral to her

## 2022-05-11 NOTE — Telephone Encounter (Signed)
Patient called stating she received a call from [facility] stating they need an order requesting what type of services patient will need post surgery. PT/ OT? Please advise.

## 2022-05-12 ENCOUNTER — Telehealth: Payer: Self-pay | Admitting: Orthopaedic Surgery

## 2022-05-12 DIAGNOSIS — Z01818 Encounter for other preprocedural examination: Secondary | ICD-10-CM

## 2022-05-12 NOTE — Telephone Encounter (Signed)
Patient called requesting order for home health be faxed to [facility] at fax # [phone]. If you have any issues, please call them at [phone].

## 2022-05-13 NOTE — Telephone Encounter (Signed)
Referral faxed to [phone]

## 2022-05-14 ENCOUNTER — Other Ambulatory Visit: Payer: Self-pay | Admitting: Emergency Medicine

## 2022-05-14 DIAGNOSIS — M25519 Pain in unspecified shoulder: Secondary | ICD-10-CM

## 2022-05-15 ENCOUNTER — Telehealth: Payer: Self-pay | Admitting: Orthopaedic Surgery

## 2022-05-15 NOTE — Telephone Encounter (Signed)
Patient called today stating Katie Gardner does not except her insurance for Home Health. Patient would like for you to call this other company at [phone]. (She could not tell me company name.) Patient states you need to call there to let them know what type of services are needed post op. Patient now wants PT/OT, and a home health aide to assist with dressing changes. Patient states you will need to fax orders to [phone]. The phone number there is [phone]. Patient is every confused with this whole process. Please call to advise.

## 2022-05-15 NOTE — Telephone Encounter (Signed)
Holding for [employer]. Patient's surgery is not scheduled until 06/02/2022.

## 2022-05-20 ENCOUNTER — Ambulatory Visit: Payer: 59 | Admitting: Urology

## 2022-05-27 ENCOUNTER — Encounter (HOSPITAL_BASED_OUTPATIENT_CLINIC_OR_DEPARTMENT_OTHER): Payer: 59 | Admitting: Orthopaedic Surgery

## 2022-06-01 ENCOUNTER — Telehealth: Payer: Self-pay

## 2022-06-01 NOTE — Telephone Encounter (Signed)
Graham to schedule their annual wellness visit. Appointment made for 06/03/22.  Norton Blizzard, Lamy (AAMA)  [facility] [phone]

## 2022-06-02 ENCOUNTER — Ambulatory Visit (HOSPITAL_BASED_OUTPATIENT_CLINIC_OR_DEPARTMENT_OTHER): Admission: RE | Admit: 2022-06-02 | Payer: 59 | Source: Home / Self Care | Admitting: Orthopaedic Surgery

## 2022-06-02 DIAGNOSIS — Z01818 Encounter for other preprocedural examination: Secondary | ICD-10-CM

## 2022-06-02 HISTORY — DX: Family history of other specified conditions: Z84.89

## 2022-06-02 HISTORY — DX: Essential (primary) hypertension: I10

## 2022-06-02 HISTORY — DX: Other specified postprocedural states: Z98.890

## 2022-06-02 HISTORY — DX: Nausea with vomiting, unspecified: R11.2

## 2022-06-02 SURGERY — ARTHROSCOPY, KNEE, WITH MENISCUS REPAIR
Anesthesia: Regional | Site: Knee | Laterality: Left

## 2022-06-03 ENCOUNTER — Ambulatory Visit (INDEPENDENT_AMBULATORY_CARE_PROVIDER_SITE_OTHER): Payer: 59

## 2022-06-03 VITALS — Ht 60.0 in | Wt 132.0 lb

## 2022-06-03 DIAGNOSIS — Z135 Encounter for screening for eye and ear disorders: Secondary | ICD-10-CM

## 2022-06-03 DIAGNOSIS — Z0000 Encounter for general adult medical examination without abnormal findings: Secondary | ICD-10-CM

## 2022-06-03 NOTE — Patient Instructions (Signed)
Katie Gardner , Thank you for taking time to come for your Medicare Wellness Visit. I appreciate your ongoing commitment to your health goals. Please review the following plan we discussed and let me know if I can assist you in the future.   These are the goals we discussed:  Goals      Client understands the importance of follow-up with providers by attending scheduled visits        This is a list of the screening recommended for you and due dates:  Health Maintenance  Topic Date Due   Hepatitis C Screening: USPSTF Recommendation to screen - Ages 66-79 yo.  Never done   DEXA scan (bone density measurement)  Never done   Flu Shot  06/07/2022*   Pneumonia Vaccine (1 of 1 - PCV) 11/04/2022*   Medicare Annual Wellness Visit  06/03/2023   Mammogram  03/12/2024   HPV Vaccine  Aged Out   DTaP/Tdap/Td vaccine  Discontinued   Colon Cancer Screening  Discontinued   COVID-19 Vaccine  Discontinued   Zoster (Shingles) Vaccine  Discontinued  *Topic was postponed. The date shown is not the original due date.    Advanced directives: Advance directive discussed with you today. I have provided a copy for you to complete at home and have notarized. Once this is complete please bring a copy in to our office so we can scan it into your chart.  Conditions/risks identified: Yes  Next appointment: Follow up in one year for your annual wellness visit.   Preventive Care 3 Years and Older, Female Preventive care refers to lifestyle choices and visits with your health care provider that can promote health and wellness. What does preventive care include? A yearly physical exam. This is also called an annual well check. Dental exams once or twice a year. Routine eye exams. Ask your health care provider how often you should have your eyes checked. Personal lifestyle choices, including: Daily care of your teeth and gums. Regular physical activity. Eating a healthy diet. Avoiding tobacco and drug  use. Limiting alcohol use. Practicing safe sex. Taking low-dose aspirin every day. Taking vitamin and mineral supplements as recommended by your health care provider. What happens during an annual well check? The services and screenings done by your health care provider during your annual well check will depend on your age, overall health, lifestyle risk factors, and family history of disease. Counseling  Your health care provider may ask you questions about your: Alcohol use. Tobacco use. Drug use. Emotional well-being. Home and relationship well-being. Sexual activity. Eating habits. History of falls. Memory and ability to understand (cognition). Work and work Statistician. Reproductive health. Screening  You may have the following tests or measurements: Height, weight, and BMI. Blood pressure. Lipid and cholesterol levels. These may be checked every 5 years, or more frequently if you are over 15 years old. Skin check. Lung cancer screening. You may have this screening every year starting at age 23 if you have a 30-pack-year history of smoking and currently smoke or have quit within the past 15 years. Fecal occult blood test (FOBT) of the stool. You may have this test every year starting at age 23. Flexible sigmoidoscopy or colonoscopy. You may have a sigmoidoscopy every 5 years or a colonoscopy every 10 years starting at age 8. Hepatitis C blood test. Hepatitis B blood test. Sexually transmitted disease (STD) testing. Diabetes screening. This is done by checking your blood sugar (glucose) after you have not eaten for a while (  fasting). You may have this done every 1-3 years. Bone density scan. This is done to screen for osteoporosis. You may have this done starting at age 85. Mammogram. This may be done every 1-2 years. Talk to your health care provider about how often you should have regular mammograms. Talk with your health care provider about your test results, treatment  options, and if necessary, the need for more tests. Vaccines  Your health care provider may recommend certain vaccines, such as: Influenza vaccine. This is recommended every year. Tetanus, diphtheria, and acellular pertussis (Tdap, Td) vaccine. You may need a Td booster every 10 years. Zoster vaccine. You may need this after age 37. Pneumococcal 13-valent conjugate (PCV13) vaccine. One dose is recommended after age 36. Pneumococcal polysaccharide (PPSV23) vaccine. One dose is recommended after age 56. Talk to your health care provider about which screenings and vaccines you need and how often you need them. This information is not intended to replace advice given to you by your health care provider. Make sure you discuss any questions you have with your health care provider. Document Released: 03/22/2015 Document Revised: 11/13/2015 Document Reviewed: 12/25/2014 Elsevier Interactive Patient Education  2017 [employer] Prevention in the Home Falls can cause injuries. They can happen to people of all ages. There are many things you can do to make your home safe and to help prevent falls. What can I do on the outside of my home? Regularly fix the edges of walkways and driveways and fix any cracks. Remove anything that might make you trip as you walk through a door, such as a raised step or threshold. Trim any bushes or trees on the path to your home. Use bright outdoor lighting. Clear any walking paths of anything that might make someone trip, such as rocks or tools. Regularly check to see if handrails are loose or broken. Make sure that both sides of any steps have handrails. Any raised decks and porches should have guardrails on the edges. Have any leaves, snow, or ice cleared regularly. Use sand or salt on walking paths during winter. Clean up any spills in your garage right away. This includes oil or grease spills. What can I do in the bathroom? Use night lights. Install grab  bars by the toilet and in the tub and shower. Do not use towel bars as grab bars. Use non-skid mats or decals in the tub or shower. If you need to sit down in the shower, use a plastic, non-slip stool. Keep the floor dry. Clean up any water that spills on the floor as soon as it happens. Remove soap buildup in the tub or shower regularly. Attach bath mats securely with double-sided non-slip rug tape. Do not have throw rugs and other things on the floor that can make you trip. What can I do in the bedroom? Use night lights. Make sure that you have a light by your bed that is easy to reach. Do not use any sheets or blankets that are too big for your bed. They should not hang down onto the floor. Have a firm chair that has side arms. You can use this for support while you get dressed. Do not have throw rugs and other things on the floor that can make you trip. What can I do in the kitchen? Clean up any spills right away. Avoid walking on wet floors. Keep items that you use a lot in easy-to-reach places. If you need to reach something above you, use  a strong step stool that has a grab bar. Keep electrical cords out of the way. Do not use floor polish or wax that makes floors slippery. If you must use wax, use non-skid floor wax. Do not have throw rugs and other things on the floor that can make you trip. What can I do with my stairs? Do not leave any items on the stairs. Make sure that there are handrails on both sides of the stairs and use them. Fix handrails that are broken or loose. Make sure that handrails are as long as the stairways. Check any carpeting to make sure that it is firmly attached to the stairs. Fix any carpet that is loose or worn. Avoid having throw rugs at the top or bottom of the stairs. If you do have throw rugs, attach them to the floor with carpet tape. Make sure that you have a light switch at the top of the stairs and the bottom of the stairs. If you do not have them,  ask someone to add them for you. What else can I do to help prevent falls? Wear shoes that: Do not have high heels. Have rubber bottoms. Are comfortable and fit you well. Are closed at the toe. Do not wear sandals. If you use a stepladder: Make sure that it is fully opened. Do not climb a closed stepladder. Make sure that both sides of the stepladder are locked into place. Ask someone to hold it for you, if possible. Clearly mark and make sure that you can see: Any grab bars or handrails. First and last steps. Where the edge of each step is. Use tools that help you move around (mobility aids) if they are needed. These include: Canes. Walkers. Scooters. Crutches. Turn on the lights when you go into a dark area. Replace any light bulbs as soon as they burn out. Set up your furniture so you have a clear path. Avoid moving your furniture around. If any of your floors are uneven, fix them. If there are any pets around you, be aware of where they are. Review your medicines with your doctor. Some medicines can make you feel dizzy. This can increase your chance of falling. Ask your doctor what other things that you can do to help prevent falls. This information is not intended to replace advice given to you by your health care provider. Make sure you discuss any questions you have with your health care provider. Document Released: 12/20/2008 Document Revised: 08/01/2015 Document Reviewed: 03/30/2014 Elsevier Interactive Patient Education  2017 [employer].

## 2022-06-03 NOTE — Progress Notes (Signed)
I connected with  Gemma Payor on 06/03/22 by a audio enabled telemedicine application and verified that I am speaking with the correct person using two identifiers.  Patient Location: Home  Provider Location: Office/Clinic  I discussed the limitations of evaluation and management by telemedicine. The patient expressed understanding and agreed to proceed.  Subjective:   Katie Gardner is a 67 y.o. female who presents for Medicare Annual (Subsequent) preventive examination.  Review of Systems     Cardiac Risk Factors include: advanced age (>17men, >30 women);dyslipidemia;family history of premature cardiovascular disease;hypertension;sedentary lifestyle     Objective:    Today's Vitals   06/03/22 1306  Weight: 132 lb (59.9 kg)  Height: 5' (1.524 m)  PainSc: 0-No pain   Body mass index is 25.78 kg/m.     06/03/2022    1:07 PM 12/26/2020    2:37 PM 04/12/2017    2:57 PM  Advanced Directives  Does Patient Have a Medical Advance Directive? No No No  Would patient like information on creating a medical advance directive? No - Patient declined No - Patient declined Yes (MAU/Ambulatory/Procedural Areas - Information given)    Current Medications (verified) Outpatient Encounter Medications as of 06/03/2022  Medication Sig   ALPRAZolam (XANAX) 0.5 MG tablet TAKE 1 TABLET(0.5 MG) BY MOUTH TWICE DAILY AS NEEDED FOR ANXIETY   cetirizine (ZYRTEC) 10 MG tablet TAKE 1 TABLET(10 MG) BY MOUTH DAILY   cyclobenzaprine (FLEXERIL) 5 MG tablet TAKE 1 TABLET(5 MG) BY MOUTH THREE TIMES DAILY AS NEEDED FOR MUSCLE SPASMS   FLUoxetine (PROZAC) 10 MG tablet Take 1 tablet (10 mg total) by mouth daily.   ibuprofen (ADVIL) 600 MG tablet TAKE 1 TABLET(600 MG) BY MOUTH EVERY 8 HOURS AS NEEDED FOR MODERATE PAIN   lisinopril (ZESTRIL) 10 MG tablet TAKE 1 TABLET(10 MG) BY MOUTH DAILY   loratadine (CLARITIN) 10 MG tablet Take 1 tablet (10 mg total) by mouth daily.   LORazepam (ATIVAN) 1 MG tablet Sig 1 mg  tablet 1 hour before procedure   LORazepam (ATIVAN) 1 MG tablet Take 1 tablet (1 mg total) by mouth every 8 (eight) hours.   rosuvastatin (CRESTOR) 10 MG tablet TAKE 1 TABLET(10 MG) BY MOUTH DAILY   No facility-administered encounter medications on file as of 06/03/2022.    Allergies (verified) Macrobid [nitrofurantoin], Hydrocodone, and Mucinex dm [dm-guaifenesin er]   History: Past Medical History:  Diagnosis Date   Allergy    seasonal   Anxiety    Depression    Family history of adverse reaction to anesthesia    sister hard to wake up   Hematuria    Hot flashes    Hx of cardiovascular stress test    Lex MV 12/13:  EF 68%, mild apical thinning, no ischemia   Hyperlipidemia    Hypertension    PONV (postoperative nausea and vomiting)    Past Surgical History:  Procedure Laterality Date   BREAST LUMPECTOMY     right breast, not cancer   Carpel tunnel     ECTOPIC PREGNANCY SURGERY     Family History  Problem Relation Age of Onset   Heart disease Mother    Diabetes Mother    Esophageal cancer Father        49's   Colon cancer Neg Hx    Social History   Socioeconomic History   Marital status: Legally Separated    Spouse name: Not on file   Number of children: 1   Years of education: 8th grade  Highest education level: Not on file  Occupational History    Employer: UNEMPLOYED  Tobacco Use   Smoking status: Former   Smokeless tobacco: Never  Vaping Use   Vaping Use: Never used  Substance and Sexual Activity   Alcohol use: No   Drug use: No   Sexual activity: Yes  Other Topics Concern   Not on file  Social History Narrative   Not on file   Social Determinants of Health   Financial Resource Strain: Low Risk  (06/03/2022)   Overall Financial Resource Strain (CARDIA)    Difficulty of Paying Living Expenses: Not hard at all  Food Insecurity: No Food Insecurity (06/03/2022)   Hunger Vital Sign    Worried About Running Out of Food in the Last Year: Never true     Ran Out of Food in the Last Year: Never true  Transportation Needs: No Transportation Needs (06/03/2022)   PRAPARE - Hydrologist (Medical): No    Lack of Transportation (Non-Medical): No  Physical Activity: Inactive (06/03/2022)   Exercise Vital Sign    Days of Exercise per Week: 0 days    Minutes of Exercise per Session: 0 min  Stress: No Stress Concern Present (06/03/2022)   [place]    Feeling of Stress : Not at all  Social Connections: Socially Isolated (06/03/2022)   Social Connection and Isolation Panel [NHANES]    Frequency of Communication with Friends and Family: Once a week    Frequency of Social Gatherings with Friends and Family: Once a week    Attends Religious Services: Never    Marine scientist or Organizations: No    Attends Music therapist: Never    Marital Status: Never married    Tobacco Counseling Counseling given: Not Answered   Clinical Intake:  Pre-visit preparation completed: Yes  Pain : No/denies pain Pain Score: 0-No pain     BMI - recorded: 25.78 Nutritional Status: BMI 25 -29 Overweight Nutritional Risks: None Diabetes: No  How often do you need to have someone help you when you read instructions, pamphlets, or other written materials from your doctor or pharmacy?: 1 - Never What is the last grade level you completed in school?: HSG  Diabetic? No  Interpreter Needed?: No  Information entered by :: Lisette Abu, LPN.   Activities of Daily Living    06/03/2022    1:16 PM  In your present state of health, do you have any difficulty performing the following activities:  Hearing? 0  Vision? 1  Comment has not seen eye doctor in >10 years  Difficulty concentrating or making decisions? 1  Walking or climbing stairs? 0  Dressing or bathing? 0  Doing errands, shopping? 0  Preparing Food and eating ? N  Using the  Toilet? N  In the past six months, have you accidently leaked urine? Y  Comment when sneezing or coughing  Do you have problems with loss of bowel control? N  Managing your Medications? N  Managing your Finances? N  Housekeeping or managing your Housekeeping? N    Patient Care Team: Horald Pollen, MD as PCP - General (Internal Medicine)  Indicate any recent Medical Services you may have received from other than [facility] providers in the past year (date may be approximate).     Assessment:   This is a routine wellness examination for Katie Gardner.  Hearing/Vision screen Hearing Screening - Comments:: Denies  hearing difficulties   Vision Screening - Comments:: Patient has no optometrist; requesting referral from pcp.  Dietary issues and exercise activities discussed: Current Exercise Habits: The patient does not participate in regular exercise at present, Exercise limited by: psychological condition(s);Other - see comments (chronic musculoskeletal pain)   Goals Addressed             This Visit's Progress    Client understands the importance of follow-up with providers by attending scheduled visits        Depression Screen    06/03/2022    1:14 PM 05/04/2022    2:08 PM 02/17/2022    2:46 PM 11/26/2021    1:27 PM 11/03/2021    2:27 PM 12/26/2020    2:28 PM 12/09/2020    3:27 PM  PHQ 2/9 Scores  PHQ - 2 Score 2 2 0 0 0 0 0  PHQ- 9 Score 4 4 0  0      Fall Risk    06/03/2022    1:08 PM 05/04/2022    2:08 PM 02/17/2022    2:46 PM 11/26/2021    1:28 PM 11/26/2021    1:23 PM  [place] in the past year? 0 0 0 1 0  Number falls in past yr: 0 0 0 0 0  Injury with Fall? 0 0 0 0 0  Risk for fall due to : No Fall Risks No Fall Risks No Fall Risks No Fall Risks   Follow up Falls prevention discussed Falls evaluation completed Falls evaluation completed Falls evaluation completed     FALL RISK PREVENTION PERTAINING TO THE HOME:  Any stairs in or around the home? No   If so, are there any without handrails? No  Home free of loose throw rugs in walkways, pet beds, electrical cords, etc? Yes  Adequate lighting in your home to reduce risk of falls? Yes   ASSISTIVE DEVICES UTILIZED TO PREVENT FALLS:  Life alert? No  Use of a cane, walker or w/c? No  Grab bars in the bathroom? No  Shower chair or bench in shower? No  Elevated toilet seat or a handicapped toilet? No   TIMED UP AND GO:  Was the test performed? No . Telephonic Visit  Cognitive Function:        06/03/2022    1:18 PM 04/12/2017    3:08 PM  6CIT Screen  What Year? 0 points 0 points  What month? 0 points 0 points  What time? 0 points 0 points  Count back from 20 0 points 0 points  Months in reverse 0 points 0 points  Repeat phrase 0 points 2 points  Total Score 0 points 2 points    Immunizations Immunization History  Administered Date(s) Administered   DT (Pediatric) 03/09/2008   Influenza,inj,Quad PF,6+ Mos 06/06/2018, 03/15/2019   Influenza-Unspecified 11/25/2011    TDAP status: Declined, Education has been provided regarding the importance of this vaccine but patient still declined. Advised may receive this vaccine at local pharmacy or Health Dept. Aware to provide a copy of the vaccination record if obtained from local pharmacy or Health Dept. Verbalized acceptance and understanding.  Flu Vaccine status: Declined, Education has been provided regarding the importance of this vaccine but patient still declined. Advised may receive this vaccine at local pharmacy or Health Dept. Aware to provide a copy of the vaccination record if obtained from local pharmacy or Health Dept. Verbalized acceptance and understanding.  Pneumococcal vaccine status: Declined,  Education  has been provided regarding the importance of this vaccine but patient still declined. Advised may receive this vaccine at local pharmacy or Health Dept. Aware to provide a copy of the vaccination record if obtained from  local pharmacy or Health Dept. Verbalized acceptance and understanding.   Covid-19 vaccine status: Declined, Education has been provided regarding the importance of this vaccine but patient still declined. Advised may receive this vaccine at local pharmacy or Health Dept.or vaccine clinic. Aware to provide a copy of the vaccination record if obtained from local pharmacy or Health Dept. Verbalized acceptance and understanding.  Qualifies for Shingles Vaccine? Yes   Zostavax completed No   Shingrix Completed?: No.    Education has been provided regarding the importance of this vaccine. Patient has been advised to call insurance company to determine out of pocket expense if they have not yet received this vaccine. Advised may also receive vaccine at local pharmacy or Health Dept. Verbalized acceptance and understanding.  Screening Tests Health Maintenance  Topic Date Due   COVID-19 Vaccine (1) Never done   Hepatitis C Screening  Never done   Zoster Vaccines- Shingrix (1 of 2) Never done   COLONOSCOPY (Pts 45-56yrs Insurance coverage will need to be confirmed)  Never done   MAMMOGRAM  01/11/2014   DEXA SCAN  Never done   INFLUENZA VACCINE  06/07/2022 (Originally 10/07/2021)   Pneumonia Vaccine 84+ Years old (1 of 1 - PCV) 11/04/2022 (Originally 11/29/2020)   Medicare Annual Wellness (AWV)  06/03/2023   HPV VACCINES  Aged Out   DTaP/Tdap/Td  Discontinued    Health Maintenance  Health Maintenance Due  Topic Date Due   COVID-19 Vaccine (1) Never done   Hepatitis C Screening  Never done   Zoster Vaccines- Shingrix (1 of 2) Never done   COLONOSCOPY (Pts 45-20yrs Insurance coverage will need to be confirmed)  Never done   MAMMOGRAM  01/11/2014   DEXA SCAN  Never done    Colorectal screening status: Patient declined.  Mammogram status: Completed 03/12/2022. Repeat every year  Bone Density status: Never Done  Lung Cancer Screening: (Low Dose CT Chest recommended if Age 69-80 years, 30  pack-year currently smoking OR have quit w/in 15years.) does not qualify.   Lung Cancer Screening Referral: NO  Additional Screening:  Hepatitis C Screening: does qualify; Completed No  Vision Screening: Recommended annual ophthalmology exams for early detection of glaucoma and other disorders of the eye. Is the patient up to date with their annual eye exam?  No  Who is the provider or what is the name of the office in which the patient attends annual eye exams? Has not seen eye doctor in over 10 years If pt is not established with a provider, would they like to be referred to a provider to establish care? Yes .   Dental Screening: Recommended annual dental exams for proper oral hygiene  Community Resource Referral / Chronic Care Management: CRR required this visit?  Yes ; appointment with eye doctor  CCM required this visit?  No      Plan:     I have personally reviewed and noted the following in the patient's chart:   Medical and social history Use of alcohol, tobacco or illicit drugs  Current medications and supplements including opioid prescriptions. Patient is not currently taking opioid prescriptions. Functional ability and status Nutritional status Physical activity Advanced directives List of other physicians Hospitalizations, surgeries, and ER visits in previous 12 months Vitals Screenings to include cognitive,  depression, and falls Referrals and appointments  In addition, I have reviewed and discussed with patient certain preventive protocols, quality metrics, and best practice recommendations. A written personalized care plan for preventive services as well as general preventive health recommendations were provided to patient.     Sheral Flow, LPN   [id number]   Nurse Notes:  Normal cognitive status assessed by direct observation by this Nurse Health Advisor. No abnormalities found.

## 2022-06-09 ENCOUNTER — Ambulatory Visit (INDEPENDENT_AMBULATORY_CARE_PROVIDER_SITE_OTHER): Payer: 59 | Admitting: Emergency Medicine

## 2022-06-09 ENCOUNTER — Encounter: Payer: Self-pay | Admitting: Emergency Medicine

## 2022-06-09 VITALS — BP 130/72 | HR 60 | Temp 97.9°F | Ht 60.0 in | Wt 132.0 lb

## 2022-06-09 DIAGNOSIS — E785 Hyperlipidemia, unspecified: Secondary | ICD-10-CM

## 2022-06-09 DIAGNOSIS — F32A Depression, unspecified: Secondary | ICD-10-CM | POA: Diagnosis not present

## 2022-06-09 DIAGNOSIS — F419 Anxiety disorder, unspecified: Secondary | ICD-10-CM | POA: Diagnosis not present

## 2022-06-09 DIAGNOSIS — Z1211 Encounter for screening for malignant neoplasm of colon: Secondary | ICD-10-CM

## 2022-06-09 DIAGNOSIS — R5383 Other fatigue: Secondary | ICD-10-CM | POA: Insufficient documentation

## 2022-06-09 DIAGNOSIS — R29818 Other symptoms and signs involving the nervous system: Secondary | ICD-10-CM | POA: Diagnosis not present

## 2022-06-09 DIAGNOSIS — I10 Essential (primary) hypertension: Secondary | ICD-10-CM

## 2022-06-09 LAB — LIPID PANEL
Cholesterol: 186 mg/dL (ref 0–200)
HDL: 59.5 mg/dL (ref >39.00)
LDL Cholesterol: 100 mg/dL — ABNORMAL HIGH (ref 0–99)
NonHDL: 126.42
Total CHOL/HDL Ratio: 3
Triglycerides: 133 mg/dL (ref 0.0–149.0)
VLDL: 26.6 mg/dL (ref 0.0–40.0)

## 2022-06-09 LAB — COMPREHENSIVE METABOLIC PANEL
ALT: 18 U/L (ref 0–35)
AST: 19 U/L (ref 0–37)
Albumin: 4.2 g/dL (ref 3.5–5.2)
Alkaline Phosphatase: 63 U/L (ref 39–117)
BUN: 17 mg/dL (ref 6–23)
CO2: 31 mEq/L (ref 19–32)
Calcium: 9.7 mg/dL (ref 8.4–10.5)
Chloride: 103 mEq/L (ref 96–112)
Creatinine, Ser: 0.76 mg/dL (ref 0.40–1.20)
GFR: 81.59 mL/min (ref >60.00)
Glucose, Bld: 102 mg/dL — ABNORMAL HIGH (ref 70–99)
Potassium: 4.6 mEq/L (ref 3.5–5.1)
Sodium: 138 mEq/L (ref 135–145)
Total Bilirubin: 0.3 mg/dL (ref 0.2–1.2)
Total Protein: 6.8 g/dL (ref 6.0–8.3)

## 2022-06-09 LAB — TSH: TSH: 1.49 u[IU]/mL (ref 0.35–5.50)

## 2022-06-09 LAB — CBC WITH DIFFERENTIAL/PLATELET
Basophils Absolute: 0 10*3/uL (ref 0.0–0.1)
Basophils Relative: 0.8 % (ref 0.0–3.0)
Eosinophils Absolute: 0.1 10*3/uL (ref 0.0–0.7)
Eosinophils Relative: 2 % (ref 0.0–5.0)
HCT: 40.8 % (ref 36.0–46.0)
Hemoglobin: 13.7 g/dL (ref 12.0–15.0)
Lymphocytes Relative: 42 % (ref 12.0–46.0)
Lymphs Abs: 2.4 10*3/uL (ref 0.7–4.0)
MCHC: 33.5 g/dL (ref 30.0–36.0)
MCV: 87.6 fl (ref 78.0–100.0)
Monocytes Absolute: 0.6 10*3/uL (ref 0.1–1.0)
Monocytes Relative: 11.4 % (ref 3.0–12.0)
Neutro Abs: 2.5 10*3/uL (ref 1.4–7.7)
Neutrophils Relative %: 43.8 % (ref 43.0–77.0)
Platelets: 208 10*3/uL (ref 150.0–400.0)
RBC: 4.66 Mil/uL (ref 3.87–5.11)
RDW: 13.3 % (ref 11.5–15.5)
WBC: 5.7 10*3/uL (ref 4.0–10.5)

## 2022-06-09 LAB — VITAMIN B12: Vitamin B-12: 663 pg/mL (ref 211–911)

## 2022-06-09 LAB — VITAMIN D 25 HYDROXY (VIT D DEFICIENCY, FRACTURES): VITD: 44.74 ng/mL (ref 30.00–100.00)

## 2022-06-09 LAB — HEMOGLOBIN A1C: Hgb A1c MFr Bld: 5.8 % (ref 4.6–6.5)

## 2022-06-09 NOTE — Patient Instructions (Signed)
Fatigue If you have fatigue, you feel tired all the time and have a lack of energy or a lack of motivation. Fatigue may make it difficult to start or complete tasks because of exhaustion. Occasional or mild fatigue is often a normal response to activity or life. However, long-term (chronic) or extreme fatigue may be a symptom of a medical condition such as: Depression. Not having enough red blood cells or hemoglobin in the blood (anemia). A problem with a small gland located in the lower front part of the neck (thyroid disorder). Rheumatologic conditions. These are problems related to the body's defense system (immune system). Infections, especially certain viral infections. Fatigue can also lead to negative health outcomes over time. Follow these instructions at home: Medicines Take over-the-counter and prescription medicines only as told by your health care provider. Take a multivitamin if told by your health care provider. Do not use herbal or dietary supplements unless they are approved by your health care provider. Eating and drinking  Avoid heavy meals in the evening. Eat a well-balanced diet, which includes lean proteins, whole grains, plenty of fruits and vegetables, and low-fat dairy products. Avoid eating or drinking too many products with caffeine in them. Avoid alcohol. Drink enough fluid to keep your urine pale yellow. Activity  Exercise regularly, as told by your health care provider. Use or practice techniques to help you relax, such as yoga, tai chi, meditation, or massage therapy. Lifestyle Change situations that cause you stress. Try to keep your work and personal schedules in balance. Do not use recreational or illegal drugs. General instructions Monitor your fatigue for any changes. Go to bed and get up at the same time every day. Avoid fatigue by pacing yourself during the day and getting enough sleep at night. Maintain a healthy weight. Contact a health care  provider if: Your fatigue does not get better. You have a fever. You suddenly lose or gain weight. You have headaches. You have trouble falling asleep or sleeping through the night. You feel angry, guilty, anxious, or sad. You have swelling in your legs or another part of your body. Get help right away if: You feel confused, feel like you might faint, or faint. Your vision is blurry or you have a severe headache. You have severe pain in your abdomen, your back, or the area between your waist and hips (pelvis). You have chest pain, shortness of breath, or an irregular or fast heartbeat. You are unable to urinate, or you urinate less than normal. You have abnormal bleeding from the rectum, nose, lungs, nipples, or, if you are female, the vagina. You vomit blood. You have thoughts about hurting yourself or others. These symptoms may be an emergency. Get help right away. Call 911. Do not wait to see if the symptoms will go away. Do not drive yourself to the hospital. Get help right away if you feel like you may hurt yourself or others, or have thoughts about taking your own life. Go to your nearest emergency room or: Call 911. Call the National Suicide Prevention Lifeline at 1-800-273-8255 or 988. This is open 24 hours a day. Text the Crisis Text Line at 741741. Summary If you have fatigue, you feel tired all the time and have a lack of energy or a lack of motivation. Fatigue may make it difficult to start or complete tasks because of exhaustion. Long-term (chronic) or extreme fatigue may be a symptom of a medical condition. Exercise regularly, as told by your health care provider.   Change situations that cause you stress. Try to keep your work and personal schedules in balance. This information is not intended to replace advice given to you by your health care provider. Make sure you discuss any questions you have with your health care provider. Document Revised: 12/16/2020 Document  Reviewed: 12/16/2020 Elsevier Patient Education  2023 Else[employer] Inc[employer]  

## 2022-06-09 NOTE — Assessment & Plan Note (Signed)
Clinically stable.  No red flag signs or symptoms. Differential diagnosis discussed. No recent changes in her lifestyle choices. Recommend to do blood work today Could be related to possible sleep apnea Recommend sleep apnea studies Depression/anxiety may be a contributing factor

## 2022-06-09 NOTE — Progress Notes (Signed)
Katie Gardner 67 y.o.   Chief Complaint  Patient presents with   Fatigue    Patient is having severe fatigue    HISTORY OF PRESENT ILLNESS: This is a 67 y.o. female complaining of feeling tired and fatigue for the last several weeks No associated symptoms No recent viral illness No new medications or lifestyle changes Non-smoker and no EtOH user Wakes up tired.  Snores.  Daytime somnolence. No other complaints or medical concerns today.  HPI   [name]   Medication Sig Start Date End Date Taking? Authorizing Provider  ALPRAZolam (XANAX) 0.5 MG tablet TAKE 1 TABLET(0.5 MG) BY MOUTH TWICE DAILY AS NEEDED FOR ANXIETY 05/07/22  Yes Josuha Fontanez, Ines Bloomer, MD  cetirizine (ZYRTEC) 10 MG tablet TAKE 1 TABLET(10 MG) BY MOUTH DAILY 05/15/21  Yes Stanford Strauch, Ines Bloomer, MD  cyclobenzaprine (FLEXERIL) 5 MG tablet TAKE 1 TABLET(5 MG) BY MOUTH THREE TIMES DAILY AS NEEDED FOR MUSCLE SPASMS 09/16/21  Yes Filipe Greathouse, Ines Bloomer, MD  FLUoxetine (PROZAC) 10 MG tablet Take 1 tablet (10 mg total) by mouth daily. 05/04/22  Yes Zora Glendenning, Ines Bloomer, MD  ibuprofen (ADVIL) 600 MG tablet TAKE 1 TABLET(600 MG) BY MOUTH EVERY 8 HOURS AS NEEDED FOR MODERATE PAIN 05/14/22  Yes Horald Pollen, MD  lisinopril (ZESTRIL) 10 MG tablet TAKE 1 TABLET(10 MG) BY MOUTH DAILY 11/15/21  Yes Baylee Campus, Ines Bloomer, MD  loratadine (CLARITIN) 10 MG tablet Take 1 tablet (10 mg total) by mouth daily. 09/12/18  Yes Bertine Schlottman, Ines Bloomer, MD  LORazepam (ATIVAN) 1 MG tablet Sig 1 mg tablet 1 hour before procedure 03/25/22  Yes Kinsleigh Ludolph, Ines Bloomer, MD  LORazepam (ATIVAN) 1 MG tablet Take 1 tablet (1 mg total) by mouth every 8 (eight) hours. 03/25/22  Yes Vanetta Mulders, MD  rosuvastatin (CRESTOR) 10 MG tablet TAKE 1 TABLET(10 MG) BY MOUTH DAILY 04/01/21  Yes Kaslyn Richburg, Ines Bloomer, MD    Allergies  Allergen Reactions   Macrobid [Nitrofurantoin] Nausea And Vomiting   Hydrocodone Nausea And Vomiting    dizzy    Mucinex Dm [Dm-Guaifenesin Er] Swelling    Sob, throat closing    Patient Active Problem List   Diagnosis Date Noted   Arm pain, musculoskeletal, left 05/04/2022   Urinary incontinence without sensory awareness 03/05/2022   Chronic pain of left knee 02/17/2022   Stress incontinence in female 02/17/2022   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   Dyspareunia in female 09/13/2019   Skin tags, multiple acquired 07/27/2019   Chronic insomnia 05/31/2019   Essential hypertension 12/17/2017   Seasonal allergies 12/17/2017   Anxiety and depression 11/13/2016   Chronic anxiety 11/13/2016   Chronic sore throat 11/13/2016   Hot flashes    Situational anxiety    Dyslipidemia     Past Medical History:  Diagnosis Date   Allergy    seasonal   Anxiety    Depression    Family history of adverse reaction to anesthesia    sister hard to wake up   Hematuria    Hot flashes    Hx of cardiovascular stress test    Lex MV 12/13:  EF 68%, mild apical thinning, no ischemia   Hyperlipidemia    Hypertension    PONV (postoperative nausea and vomiting)     Past Surgical History:  Procedure Laterality Date   BREAST LUMPECTOMY     right breast, not cancer   Carpel tunnel     ECTOPIC PREGNANCY SURGERY      Social History  Socioeconomic History   Marital status: Legally Separated    Spouse name: Not on file   Number of children: 1   Years of education: 8th grade   Highest education level: Not on file  Occupational History    Employer: UNEMPLOYED  Tobacco Use   Smoking status: Former   Smokeless tobacco: Never  Vaping Use   Vaping Use: Never used  Substance and Sexual Activity   Alcohol use: No   Drug use: No   Sexual activity: Yes  Other Topics Concern   Not on file  Social History Narrative   Not on file   Social Determinants of Health   Financial Resource Strain: Low Risk  (06/03/2022)   Overall Financial Resource Strain (CARDIA)    Difficulty of Paying Living  Expenses: Not hard at all  Food Insecurity: No Food Insecurity (06/03/2022)   Hunger Vital Sign    Worried About Running Out of Food in the Last Year: Never true    [place] in the Last Year: Never true  Transportation Needs: No Transportation Needs (06/03/2022)   PRAPARE - Hydrologist (Medical): No    Lack of Transportation (Non-Medical): No  Physical Activity: Inactive (06/03/2022)   Exercise Vital Sign    Days of Exercise per Week: 0 days    Minutes of Exercise per Session: 0 min  Stress: No Stress Concern Present (06/03/2022)   [place]    Feeling of Stress : Not at all  Social Connections: Socially Isolated (06/03/2022)   Social Connection and Isolation Panel [NHANES]    Frequency of Communication with Friends and Family: Once a week    Frequency of Social Gatherings with Friends and Family: Once a week    Attends Religious Services: Never    Marine scientist or Organizations: No    Attends Archivist Meetings: Never    Marital Status: Never married  Intimate Partner Violence: Not At Risk (06/03/2022)   Humiliation, Afraid, Rape, and Kick questionnaire    Fear of Current or Ex-Partner: No    Emotionally Abused: No    Physically Abused: No    Sexually Abused: No    Family History  Problem Relation Age of Onset   Heart disease Mother    Diabetes Mother    Esophageal cancer Father        56's   Colon cancer Neg Hx      Review of Systems  Constitutional:  Positive for malaise/fatigue. Negative for chills, fever and weight loss.  HENT: Negative.  Negative for congestion and sore throat.   Respiratory: Negative.  Negative for cough and shortness of breath.   Cardiovascular: Negative.  Negative for chest pain and palpitations.  Gastrointestinal: Negative.  Negative for abdominal pain, diarrhea, nausea and vomiting.  Genitourinary: Negative.  Negative for  dysuria and hematuria.  Skin: Negative.  Negative for rash.  Neurological:  Positive for headaches. Negative for dizziness.  All other systems reviewed and are negative.   Vitals:   06/09/22 0852  BP: 130/72  Pulse: 60  Temp: 97.9 F (36.6 C)  SpO2: 97%    Physical Exam Vitals reviewed.  Constitutional:      Appearance: Normal appearance.  HENT:     Head: Normocephalic.     Mouth/Throat:     Mouth: Mucous membranes are moist.     Pharynx: Oropharynx is clear.  Eyes:     Extraocular  Movements: Extraocular movements intact.     Conjunctiva/sclera: Conjunctivae normal.     Pupils: Pupils are equal, round, and reactive to light.  Cardiovascular:     Rate and Rhythm: Normal rate and regular rhythm.     Pulses: Normal pulses.     Heart sounds: Normal heart sounds.  Pulmonary:     Effort: Pulmonary effort is normal.     Breath sounds: Normal breath sounds.  Abdominal:     Palpations: Abdomen is soft.     Tenderness: There is no abdominal tenderness.  Musculoskeletal:     Cervical back: No tenderness.  Lymphadenopathy:     Cervical: No cervical adenopathy.  Skin:    General: Skin is warm and dry.  Neurological:     General: No focal deficit present.     Mental Status: She is alert and oriented to person, place, and time.  Psychiatric:        Mood and Affect: Mood normal.        Behavior: Behavior normal.      ASSESSMENT & PLAN: A total of 42 minutes was spent with the patient and counseling/coordination of care regarding preparing for this visit, review of most recent office visit notes, review of most recent blood work results, differential diagnosis of tiredness and fatigue and need for workup, stress management, education on nutrition, benefits of exercise and staying well-hydrated, prognosis, need for sleep studies, documentation, and need for follow-up.  Problem List Items Addressed This Visit       Cardiovascular and Mediastinum   Essential hypertension     Well-controlled hypertension Continue lisinopril 10 mg daily BP Readings from Last 3 Encounters:  06/09/22 130/72  05/04/22 136/80  03/05/22 (!) 146/85          Other   Dyslipidemia    Stable chronic condition Continue rosuvastatin 10 mg daily.      Anxiety and depression    Stable. Continue Prozac 10 mg daily and alprazolam as needed      Tiredness - Primary    Clinically stable.  No red flag signs or symptoms. Differential diagnosis discussed. No recent changes in her lifestyle choices. Recommend to do blood work today Could be related to possible sleep apnea Recommend sleep apnea studies Depression/anxiety may be a contributing factor      Relevant Orders   Ambulatory referral to Sleep Studies   CBC with Differential/Platelet   Comprehensive metabolic panel   Hemoglobin A1c   TSH   Vitamin B12   VITAMIN D 25 Hydroxy (Vit-D Deficiency, Fractures)   Lipid panel   Suspected sleep apnea    Snores, wakes up tired, has daytime somnolence Recommend sleep studies Referral placed today      Relevant Orders   Ambulatory referral to Sleep Studies   Other Visit Diagnoses     Screening for colon cancer       Relevant Orders   Cologuard        Patient Instructions  Fatigue If you have fatigue, you feel tired all the time and have a lack of energy or a lack of motivation. Fatigue may make it difficult to start or complete tasks because of exhaustion. Occasional or mild fatigue is often a normal response to activity or life. However, long-term (chronic) or extreme fatigue may be a symptom of a medical condition such as: Depression. Not having enough red blood cells or hemoglobin in the blood (anemia). A problem with a small gland located in the lower front part of  the neck (thyroid disorder). Rheumatologic conditions. These are problems related to the body's defense system (immune system). Infections, especially certain viral infections. Fatigue can also lead to  negative health outcomes over time. Follow these instructions at home: Medicines Take over-the-counter and prescription medicines only as told by your health care provider. Take a multivitamin if told by your health care provider. Do not use herbal or dietary supplements unless they are approved by your health care provider. Eating and drinking  Avoid heavy meals in the evening. Eat a well-balanced diet, which includes lean proteins, whole grains, plenty of fruits and vegetables, and low-fat dairy products. Avoid eating or drinking too many products with caffeine in them. Avoid alcohol. Drink enough fluid to keep your urine pale yellow. Activity  Exercise regularly, as told by your health care provider. Use or practice techniques to help you relax, such as yoga, tai chi, meditation, or massage therapy. Lifestyle Change situations that cause you stress. Try to keep your work and personal schedules in balance. Do not use recreational or illegal drugs. General instructions Monitor your fatigue for any changes. Go to bed and get up at the same time every day. Avoid fatigue by pacing yourself during the day and getting enough sleep at night. Maintain a healthy weight. Contact a health care provider if: Your fatigue does not get better. You have a fever. You suddenly lose or gain weight. You have headaches. You have trouble falling asleep or sleeping through the night. You feel angry, guilty, anxious, or sad. You have swelling in your legs or another part of your body. Get help right away if: You feel confused, feel like you might faint, or faint. Your vision is blurry or you have a severe headache. You have severe pain in your abdomen, your back, or the area between your waist and hips (pelvis). You have chest pain, shortness of breath, or an irregular or fast heartbeat. You are unable to urinate, or you urinate less than normal. You have abnormal bleeding from the rectum, nose,  lungs, nipples, or, if you are female, the vagina. You vomit blood. You have thoughts about hurting yourself or others. These symptoms may be an emergency. Get help right away. Call 911. Do not wait to see if the symptoms will go away. Do not drive yourself to the hospital. Get help right away if you feel like you may hurt yourself or others, or have thoughts about taking your own life. Go to your nearest emergency room or: Call 911. Call the [place] at (201)[phone] or 988. This is open 24 hours a day. Text the Crisis Text Line at 518-318-9952. Summary If you have fatigue, you feel tired all the time and have a lack of energy or a lack of motivation. Fatigue may make it difficult to start or complete tasks because of exhaustion. Long-term (chronic) or extreme fatigue may be a symptom of a medical condition. Exercise regularly, as told by your health care provider. Change situations that cause you stress. Try to keep your work and personal schedules in balance. This information is not intended to replace advice given to you by your health care provider. Make sure you discuss any questions you have with your health care provider. Document Revised: 12/16/2020 Document Reviewed: 12/16/2020 Elsevier Patient Education  [place], MD [facility]

## 2022-06-09 NOTE — Assessment & Plan Note (Signed)
Snores, wakes up tired, has daytime somnolence Recommend sleep studies Referral placed today

## 2022-06-09 NOTE — Assessment & Plan Note (Signed)
Stable chronic condition.  Continue rosuvastatin 10 mg daily. 

## 2022-06-09 NOTE — Assessment & Plan Note (Signed)
Stable. Continue Prozac 10 mg daily and alprazolam as needed

## 2022-06-09 NOTE — Assessment & Plan Note (Signed)
Well-controlled hypertension Continue lisinopril 10 mg daily BP Readings from Last 3 Encounters:  06/09/22 130/72  05/04/22 136/80  03/05/22 (!) 146/85

## 2022-06-17 ENCOUNTER — Encounter (HOSPITAL_BASED_OUTPATIENT_CLINIC_OR_DEPARTMENT_OTHER): Payer: 59 | Admitting: Orthopaedic Surgery

## 2022-06-18 ENCOUNTER — Other Ambulatory Visit: Payer: Self-pay | Admitting: Emergency Medicine

## 2022-06-18 DIAGNOSIS — F418 Other specified anxiety disorders: Secondary | ICD-10-CM

## 2022-06-18 DIAGNOSIS — F32A Depression, unspecified: Secondary | ICD-10-CM

## 2022-06-22 ENCOUNTER — Emergency Department (HOSPITAL_COMMUNITY): Payer: 59

## 2022-06-22 ENCOUNTER — Emergency Department (HOSPITAL_COMMUNITY)
Admission: EM | Admit: 2022-06-22 | Discharge: 2022-06-22 | Disposition: A | Discharge status: Home / Self Care | Payer: 59 | Attending: Emergency Medicine | Admitting: Emergency Medicine

## 2022-06-22 DIAGNOSIS — R202 Paresthesia of skin: Secondary | ICD-10-CM | POA: Insufficient documentation

## 2022-06-22 DIAGNOSIS — R110 Nausea: Secondary | ICD-10-CM | POA: Diagnosis not present

## 2022-06-22 DIAGNOSIS — R079 Chest pain, unspecified: Secondary | ICD-10-CM | POA: Diagnosis not present

## 2022-06-22 DIAGNOSIS — Z79899 Other long term (current) drug therapy: Secondary | ICD-10-CM | POA: Diagnosis not present

## 2022-06-22 DIAGNOSIS — R0602 Shortness of breath: Secondary | ICD-10-CM | POA: Insufficient documentation

## 2022-06-22 DIAGNOSIS — R0789 Other chest pain: Secondary | ICD-10-CM | POA: Diagnosis not present

## 2022-06-22 DIAGNOSIS — M542 Cervicalgia: Secondary | ICD-10-CM | POA: Diagnosis not present

## 2022-06-22 DIAGNOSIS — R519 Headache, unspecified: Secondary | ICD-10-CM | POA: Diagnosis not present

## 2022-06-22 DIAGNOSIS — I10 Essential (primary) hypertension: Secondary | ICD-10-CM | POA: Insufficient documentation

## 2022-06-22 LAB — TROPONIN I (HIGH SENSITIVITY)
Troponin I (High Sensitivity): 5 ng/L (ref <18)
Troponin I (High Sensitivity): 5 ng/L (ref <18)

## 2022-06-22 LAB — COMPREHENSIVE METABOLIC PANEL
ALT: 24 U/L (ref 0–44)
AST: 23 U/L (ref 15–41)
Albumin: 3.8 g/dL (ref 3.5–5.0)
Alkaline Phosphatase: 62 U/L (ref 38–126)
Anion gap: 8 (ref 5–15)
BUN: 15 mg/dL (ref 8–23)
CO2: 26 mmol/L (ref 22–32)
Calcium: 9.1 mg/dL (ref 8.9–10.3)
Chloride: 103 mmol/L (ref 98–111)
Creatinine, Ser: 0.81 mg/dL (ref 0.44–1.00)
GFR, Estimated: >60 mL/min (ref >60)
Glucose, Bld: 94 mg/dL (ref 70–99)
Potassium: 4.1 mmol/L (ref 3.5–5.1)
Sodium: 137 mmol/L (ref 135–145)
Total Bilirubin: 0.7 mg/dL (ref 0.3–1.2)
Total Protein: 6.9 g/dL (ref 6.5–8.1)

## 2022-06-22 LAB — CBC
HCT: 40.5 % (ref 36.0–46.0)
Hemoglobin: 13 g/dL (ref 12.0–15.0)
MCH: 28.9 pg (ref 26.0–34.0)
MCHC: 32.1 g/dL (ref 30.0–36.0)
MCV: 90 fL (ref 80.0–100.0)
Platelets: 192 10*3/uL (ref 150–400)
RBC: 4.5 MIL/uL (ref 3.87–5.11)
RDW: 13.2 % (ref 11.5–15.5)
WBC: 6 10*3/uL (ref 4.0–10.5)
nRBC: 0 % (ref 0.0–0.2)

## 2022-06-22 NOTE — ED Provider Notes (Signed)
[place] [facility] AT [facility] Provider Note   CSN: [id number] Arrival date & time: 06/22/22  1442     History  Chief Complaint  Patient presents with   Chest Pain    Katie Gardner is a 67 y.o. female.   Chest Pain Associated symptoms: shortness of breath   Patient presents for chest pain.  Medical history includes HLD, anxiety, depression, HTN.  Onset of chest pain was this morning.  She also described shortness of breath and a tingling sensation in the plantar aspects of both feet.  Symptoms improved after dose of Valium.  Patient reports that she typically takes Valium daily.  Currently, patient is asymptomatic.     Home Medications [name]   Medication Sig Start Date End Date Taking? Authorizing Provider  ALPRAZolam (XANAX) 0.5 MG tablet TAKE 1 TABLET(0.5 MG) BY MOUTH TWICE DAILY AS NEEDED FOR ANXIETY 06/19/22   Georgina Quint, MD  cetirizine (ZYRTEC) 10 MG tablet TAKE 1 TABLET(10 MG) BY MOUTH DAILY 05/15/21   Georgina Quint, MD  cyclobenzaprine (FLEXERIL) 5 MG tablet TAKE 1 TABLET(5 MG) BY MOUTH THREE TIMES DAILY AS NEEDED FOR MUSCLE SPASMS 09/16/21   Georgina Quint, MD  FLUoxetine (PROZAC) 10 MG tablet Take 1 tablet (10 mg total) by mouth daily. 05/04/22   Georgina Quint, MD  ibuprofen (ADVIL) 600 MG tablet TAKE 1 TABLET(600 MG) BY MOUTH EVERY 8 HOURS AS NEEDED FOR MODERATE PAIN 05/14/22   Georgina Quint, MD  lisinopril (ZESTRIL) 10 MG tablet TAKE 1 TABLET(10 MG) BY MOUTH DAILY 11/15/21   Georgina Quint, MD  loratadine (CLARITIN) 10 MG tablet Take 1 tablet (10 mg total) by mouth daily. 09/12/18   Georgina Quint, MD  LORazepam (ATIVAN) 1 MG tablet Sig 1 mg tablet 1 hour before procedure 03/25/22   Georgina Quint, MD  LORazepam (ATIVAN) 1 MG tablet Take 1 tablet (1 mg total) by mouth every 8 (eight) hours. 03/25/22   Huel Cote, MD  rosuvastatin (CRESTOR) 10 MG tablet TAKE 1  TABLET(10 MG) BY MOUTH DAILY 04/01/21   Georgina Quint, MD      Allergies    Macrobid [nitrofurantoin], Hydrocodone, and Mucinex dm [dm-guaifenesin er]    Review of Systems   Review of Systems  Respiratory:  Positive for shortness of breath.   Cardiovascular:  Positive for chest pain.    Physical Exam Updated Vital Signs BP 130/64   Pulse 77   Temp 99.1 F (37.3 C) (Oral)   Resp (!) 23   SpO2 98%  Physical Exam Vitals and nursing note reviewed.  Constitutional:      General: She is not in acute distress.    Appearance: She is well-developed. She is not ill-appearing, toxic-appearing or diaphoretic.  HENT:     Head: Normocephalic and atraumatic.  Eyes:     Conjunctiva/sclera: Conjunctivae normal.  Cardiovascular:     Rate and Rhythm: Normal rate and regular rhythm.     Heart sounds: No murmur heard. Pulmonary:     Effort: Pulmonary effort is normal. No tachypnea or respiratory distress.     Breath sounds: Normal breath sounds. No decreased breath sounds, wheezing, rhonchi or rales.  Chest:     Chest wall: No tenderness.  Abdominal:     Palpations: Abdomen is soft.     Tenderness: There is no abdominal tenderness.  Musculoskeletal:        General: No swelling. Normal range of motion.  Cervical back: Normal range of motion and neck supple.     Right lower leg: No edema.     Left lower leg: No edema.  Skin:    General: Skin is warm and dry.     Capillary Refill: Capillary refill takes less than 2 seconds.     Coloration: Skin is not cyanotic or pale.  Neurological:     General: No focal deficit present.     Mental Status: She is alert and oriented to person, place, and time.  Psychiatric:        Mood and Affect: Mood normal.        Behavior: Behavior normal.     ED Results / Procedures / Treatments   Labs (all labs ordered are listed, but only abnormal results are displayed) Labs Reviewed  CBC  COMPREHENSIVE METABOLIC PANEL  TROPONIN I (HIGH  SENSITIVITY)  TROPONIN I (HIGH SENSITIVITY)    EKG EKG Interpretation  Date/Time:  Monday June 22 2022 15:20:20 EDT Ventricular Rate:  90 PR Interval:  126 QRS Duration: 80 QT Interval:  352 QTC Calculation: 430 R Axis:   23 Text Interpretation: Normal sinus rhythm Confirmed by Gloris Manchester [phone]) on 06/22/2022 8:52:00 PM  Radiology CT Head Wo Contrast  Result Date: 06/22/2022 CLINICAL DATA:  Headache. EXAM: CT HEAD WITHOUT CONTRAST TECHNIQUE: Contiguous axial images were obtained from the base of the skull through the vertex without intravenous contrast. RADIATION DOSE REDUCTION: This exam was performed according to the departmental dose-optimization program which includes automated exposure control, adjustment of the mA and/or kV according to patient size and/or use of iterative reconstruction technique. COMPARISON:  None Available. FINDINGS: Brain: There is mild cerebral atrophy with widening of the extra-axial spaces and ventricular dilatation. There are areas of decreased attenuation within the white matter tracts of the supratentorial brain, consistent with microvascular disease changes. Vascular: No hyperdense vessel or unexpected calcification. Skull: Normal. Negative for fracture or focal lesion. Sinuses/Orbits: No acute finding. Other: None. IMPRESSION: 1. No acute intracranial abnormality. 2. Generalized cerebral atrophy and microvascular disease changes of the supratentorial brain. Electronically Signed   By: Aram Candela M.D.   On: 06/22/2022 18:34   DG Chest 2 View  Result Date: 06/22/2022 CLINICAL DATA:  Chest pain EXAM: CHEST - 2 VIEW COMPARISON:  CXR 02/10/21 FINDINGS: No pleural effusion. No pneumothorax. No focal airspace opacity. Normal cardiac and mediastinal contours. No radiographically apparent displaced rib fractures. Visualized upper abdomen is unremarkable. Vertebral body heights are maintained. IMPRESSION: No focal airspace opacity. Electronically Signed   By:  Lorenza Cambridge M.D.   On: 06/22/2022 15:43    Procedures Procedures    Medications Ordered in ED Medications - No data to display  ED Course/ Medical Decision Making/ A&P                             Medical Decision Making Amount and/or Complexity of Data Reviewed Labs: ordered. Radiology: ordered.   This patient presents to the ED for concern of chest pain, this involves an extensive number of treatment options, and is a complaint that carries with it a high risk of complications and morbidity.  The differential diagnosis includes ACS, anxiety, pericarditis, GERD   Co morbidities that complicate the patient evaluation  HLD, anxiety, depression, HTN   Additional history obtained:  Additional history obtained from N/A External records from outside source obtained and reviewed including EMR   Lab Tests:  I Ordered,  and personally interpreted labs.  The pertinent results include: Normal electrolytes, normal troponins x 2   Imaging Studies ordered:  I ordered imaging studies including wrist x-ray, CT head I independently visualized and interpreted imaging which showed no acute findings I agree with the radiologist interpretation   Cardiac Monitoring: / EKG:  The patient was maintained on a cardiac monitor.  I personally viewed and interpreted the cardiac monitored which showed an underlying rhythm of: Sinus rhythm  Problem List / ED Course / Critical interventions / Medication management  Patient presenting for onset of chest pain this morning.  Prior to being bedded in the ED, diagnostic workup was initiated.  Lab work is reassuring with normal kidney function, normal electrolytes, normal hemoglobin, no leukocytosis, and normal troponin x 2.  On assessment, patient is asymptomatic and well-appearing.  Breathing is unlabored.  Given her improved symptoms after dose of Valium, I suspect symptoms are secondary to anxiety.  Patient was informed of reassuring workup results.   She does request discharge home at this time.   Social Determinants of Health:  Has PCP        Final Clinical Impression(s) / ED Diagnoses Final diagnoses:  Chest pain, unspecified type    Rx / DC Orders ED Discharge Orders          Ordered    Ambulatory referral to Cardiology       Comments: If you have not heard from the Cardiology office within the next 72 hours please call [phone].   06/22/22 2223              Gloris Manchester, MD 06/22/22 2223

## 2022-06-22 NOTE — Discharge Instructions (Signed)
Follow-up with PCP and cardiologist.  Return emergency department for any new or worsening symptoms of concern.

## 2022-06-22 NOTE — ED Triage Notes (Signed)
Patient here for evaluation of sharp chest pain that started this morning, improved during the day, but has now gotten worse again. Patient also complains of shortness of breath. Patient states she took a valium earlier today which improved her discomfort but it has not resolved completely. Patient is alert, oriented, ambulating independently with steady gait, speaking in compete sentences, and is in no apparent distress at this time.

## 2022-06-25 ENCOUNTER — Encounter: Payer: Self-pay | Admitting: Family Medicine

## 2022-06-25 ENCOUNTER — Other Ambulatory Visit: Payer: Self-pay | Admitting: Family Medicine

## 2022-06-25 ENCOUNTER — Ambulatory Visit (INDEPENDENT_AMBULATORY_CARE_PROVIDER_SITE_OTHER): Payer: 59 | Admitting: Family Medicine

## 2022-06-25 ENCOUNTER — Telehealth: Payer: Self-pay

## 2022-06-25 VITALS — BP 138/76 | HR 60 | Temp 97.6°F | Ht 60.0 in | Wt 132.0 lb

## 2022-06-25 DIAGNOSIS — R051 Acute cough: Secondary | ICD-10-CM | POA: Diagnosis not present

## 2022-06-25 DIAGNOSIS — U071 COVID-19: Secondary | ICD-10-CM

## 2022-06-25 DIAGNOSIS — M545 Low back pain, unspecified: Secondary | ICD-10-CM | POA: Diagnosis not present

## 2022-06-25 DIAGNOSIS — J069 Acute upper respiratory infection, unspecified: Secondary | ICD-10-CM

## 2022-06-25 LAB — POC COVID19 BINAXNOW: SARS Coronavirus 2 Ag: POSITIVE — AB

## 2022-06-25 MED ORDER — FLUTICASONE PROPIONATE 50 MCG/ACT NA SUSP: 2.0000 | Freq: Every day | NASAL | 1 refills | Status: DC

## 2022-06-25 MED ORDER — BENZONATATE 200 MG PO CAPS
200.0000 mg | ORAL_CAPSULE | Freq: Two times a day (BID) | ORAL | 0 refills | Status: DC | PRN
Start: 2022-06-25 — End: 2022-07-15

## 2022-06-25 MED ORDER — NIRMATRELVIR/RITONAVIR (PAXLOVID)TABLET
3.0000 | ORAL_TABLET | Freq: Two times a day (BID) | ORAL | 0 refills | Status: AC
Start: 2022-06-25 — End: 2022-06-30

## 2022-06-25 NOTE — Progress Notes (Signed)
Subjective:     Patient ID: Katie Gardner, female    DOB: 1955-07-13, 67 y.o.   MRN: [id number]  Chief Complaint  Patient presents with   Back Pain    Started when she was at the emergency room, reports it is the lower back    Back Pain Associated symptoms include a fever and headaches. Pertinent negatives include no abdominal pain, chest pain, dysuria or weakness.   Patient is in today for multiple issues. C/o a 4 day hx of nasal congestion, rhinorrhea, ST, hoarseness, and back aches. States she initially had fever. Cough is NP.  Work up for chest pain in the ED 3 days ago when her symptoms started.   Low back pain x 3-4 days. Non radiating. Denies injury.   Not a smoker.  Denies hx of lung disease.      Health Maintenance Due  Topic Date Due   Hepatitis C Screening  Never done   DEXA SCAN  Never done    Past Medical History:  Diagnosis Date   Allergy    seasonal   Anxiety    Depression    Family history of adverse reaction to anesthesia    sister hard to wake up   Hematuria    Hot flashes    Hx of cardiovascular stress test    Lex MV 12/13:  EF 68%, mild apical thinning, no ischemia   Hyperlipidemia    Hypertension    PONV (postoperative nausea and vomiting)     Past Surgical History:  Procedure Laterality Date   BREAST LUMPECTOMY     right breast, not cancer   Carpel tunnel     ECTOPIC PREGNANCY SURGERY      Family History  Problem Relation Age of Onset   Heart disease Mother    Diabetes Mother    Esophageal cancer Father        34's   Colon cancer Neg Hx     Social History   Socioeconomic History   Marital status: Legally Separated    Spouse name: Not on file   Number of children: 1   Years of education: 8th grade   Highest education level: Not on file  Occupational History    Employer: UNEMPLOYED  Tobacco Use   Smoking status: Former   Smokeless tobacco: Never  Building services engineer Use: Never used  Substance and Sexual Activity    Alcohol use: No   Drug use: No   Sexual activity: Yes  Other Topics Concern   Not on file  Social History Narrative   Not on file   Social Determinants of Health   Financial Resource Strain: Low Risk  (06/03/2022)   Overall Financial Resource Strain (CARDIA)    Difficulty of Paying Living Expenses: Not hard at all  Food Insecurity: No Food Insecurity (06/03/2022)   Hunger Vital Sign    Worried About Running Out of Food in the Last Year: Never true    Ran Out of Food in the Last Year: Never true  Transportation Needs: No Transportation Needs (06/03/2022)   PRAPARE - Administrator, Civil Service (Medical): No    Lack of Transportation (Non-Medical): No  Physical Activity: Inactive (06/03/2022)   Exercise Vital Sign    Days of Exercise per Week: 0 days    Minutes of Exercise per Session: 0 min  Stress: No Stress Concern Present (06/03/2022)   [employer] of Occupational Health - Occupational Stress Questionnaire  Feeling of Stress : Not at all  Social Connections: Socially Isolated (06/03/2022)   Social Connection and Isolation Panel [NHANES]    Frequency of Communication with Friends and Family: Once a week    Frequency of Social Gatherings with Friends and Family: Once a week    Attends Religious Services: Never    Database administrator or Organizations: No    Attends Banker Meetings: Never    Marital Status: Never married  Intimate Partner Violence: Not At Risk (06/03/2022)   Humiliation, Afraid, Rape, and Kick questionnaire    Fear of Current or Ex-Partner: No    Emotionally Abused: No    Physically Abused: No    Sexually Abused: No    Outpatient Medications Prior to Visit  Medication Sig Dispense Refill   ALPRAZolam (XANAX) 0.5 MG tablet TAKE 1 TABLET(0.5 MG) BY MOUTH TWICE DAILY AS NEEDED FOR ANXIETY 30 tablet 1   cetirizine (ZYRTEC) 10 MG tablet TAKE 1 TABLET(10 MG) BY MOUTH DAILY 90 tablet 1   cyclobenzaprine (FLEXERIL) 5 MG tablet TAKE  1 TABLET(5 MG) BY MOUTH THREE TIMES DAILY AS NEEDED FOR MUSCLE SPASMS 30 tablet 1   FLUoxetine (PROZAC) 10 MG tablet Take 1 tablet (10 mg total) by mouth daily. 90 tablet 3   ibuprofen (ADVIL) 600 MG tablet TAKE 1 TABLET(600 MG) BY MOUTH EVERY 8 HOURS AS NEEDED FOR MODERATE PAIN 30 tablet 1   lisinopril (ZESTRIL) 10 MG tablet TAKE 1 TABLET(10 MG) BY MOUTH DAILY 90 tablet 3   loratadine (CLARITIN) 10 MG tablet Take 1 tablet (10 mg total) by mouth daily. 30 tablet 11   LORazepam (ATIVAN) 1 MG tablet Sig 1 mg tablet 1 hour before procedure 5 tablet 0   LORazepam (ATIVAN) 1 MG tablet Take 1 tablet (1 mg total) by mouth every 8 (eight) hours. 2 tablet 0   rosuvastatin (CRESTOR) 10 MG tablet TAKE 1 TABLET(10 MG) BY MOUTH DAILY 90 tablet 1   No facility-administered medications prior to visit.    Allergies  Allergen Reactions   Macrobid [Nitrofurantoin] Nausea And Vomiting   Hydrocodone Nausea And Vomiting    dizzy   Mucinex Dm [Dm-Guaifenesin Er] Swelling    Sob, throat closing    Review of Systems  Constitutional:  Positive for fever and malaise/fatigue.  HENT:  Positive for congestion and sore throat. Negative for ear pain.   Respiratory:  Positive for cough. Negative for shortness of breath and wheezing.   Cardiovascular:  Negative for chest pain, palpitations and leg swelling.  Gastrointestinal:  Negative for abdominal pain, constipation, diarrhea, nausea and vomiting.  Genitourinary:  Negative for dysuria, frequency and urgency.  Musculoskeletal:  Positive for back pain.  Neurological:  Positive for headaches. Negative for dizziness and weakness.       Objective:    Physical Exam Constitutional:      General: She is not in acute distress.    Appearance: She is ill-appearing.  HENT:     Right Ear: Tympanic membrane and ear canal normal.     Left Ear: Tympanic membrane and ear canal normal.     Nose: Congestion present.     Mouth/Throat:     Mouth: Mucous membranes are moist.   Eyes:     Extraocular Movements: Extraocular movements intact.     Conjunctiva/sclera: Conjunctivae normal.  Cardiovascular:     Rate and Rhythm: Normal rate and regular rhythm.  Pulmonary:     Effort: Pulmonary effort is normal.  Breath sounds: Normal breath sounds.  Musculoskeletal:     Cervical back: Normal range of motion and neck supple.  Skin:    General: Skin is warm and dry.  Neurological:     General: No focal deficit present.     Mental Status: She is alert and oriented to person, place, and time.  Psychiatric:        Mood and Affect: Mood normal.        Behavior: Behavior normal.        Thought Content: Thought content normal.     BP 138/76 (BP Location: Left Arm, Patient Position: Sitting, Cuff Size: Large)   Pulse 60   Temp 97.6 F (36.4 C) (Temporal)   Ht 5' (1.524 m)   Wt 132 lb (59.9 kg)   SpO2 98%   BMI 25.78 kg/m  Wt Readings from Last 3 Encounters:  06/25/22 132 lb (59.9 kg)  06/09/22 132 lb (59.9 kg)  06/03/22 132 lb (59.9 kg)       Assessment & Plan:   Problem List Items Addressed This Visit   None Visit Diagnoses     COVID-19 virus infection    -  Primary   Relevant Medications   nirmatrelvir/ritonavir (PAXLOVID) 20 x 150 MG & 10 x  TABS   Acute bilateral low back pain without sciatica       Relevant Orders   POC COVID-19 (Completed)   Viral upper respiratory illness       Relevant Medications   nirmatrelvir/ritonavir (PAXLOVID) 20 x 150 MG & 10 x  TABS   Other Relevant Orders   POC COVID-19 (Completed)   Acute cough       Relevant Medications   benzonatate (TESSALON) 200 MG capsule   Other Relevant Orders   POC COVID-19 (Completed)      COVID test is positive.  Paxlovid prescribed.  Counseling on how to take medication and potential side effects.  In-depth discussion regarding symptomatic management.  Flonase and Tessalon Perles prescribed.  Continue Tylenol or ibuprofen as needed. Low back pain has  improved Follow-up if worsening   I am having Evalee Zylka start on fluticasone, nirmatrelvir/ritonavir, and benzonatate. I am also having her maintain her loratadine, rosuvastatin, cetirizine, cyclobenzaprine, lisinopril, LORazepam, LORazepam, FLUoxetine, ibuprofen, and ALPRAZolam.  Meds ordered this encounter  Medications   fluticasone (FLONASE) 50 MCG/ACT nasal spray    Sig: Place 2 sprays into both nostrils daily.    Dispense:  16 g    Refill:  1    Order Specific Question:   Supervising Provider    Answer:   Myrlene Broker [[id number]]   nirmatrelvir/ritonavir (PAXLOVID) 20 x 150 MG & 10 x  TABS    Sig: Take 3 tablets by mouth 2 (two) times daily for 5 days. (Take nirmatrelvir 150 mg two tablets twice daily for 5 days and ritonavir 100 mg one tablet twice daily for 5 days) Patient GFR is >60    Dispense:  30 tablet    Refill:  0    Order Specific Question:   Supervising Provider    Answer:   Hillard Danker A [[id number]]   benzonatate (TESSALON) 200 MG capsule    Sig: Take 1 capsule (200 mg total) by mouth 2 (two) times daily as needed for cough.    Dispense:  20 capsule    Refill:  0    Order Specific Question:   Supervising Provider    Answer:   Hillard Danker A [[id number]]

## 2022-06-25 NOTE — Telephone Encounter (Signed)
        Patient  visited [place] on 4/15     Telephone encounter attempt :  1st  A HIPAA compliant voice message was left requesting a return call.  Instructed patient to call back    Genisis Sonnier Pop Health Care Guide, [place] [phone] [address], [place],  [address] Phone: [phone] Email: [email or web address]       

## 2022-06-25 NOTE — Patient Instructions (Signed)
You are positive for Covid today.   Start the antiviral medication today as prescribed.   I prescribed cough medication called Tessalon and a nasal spray called Flonase.   Continue taking Tylenol or ibuprofen as needed.   Follow up as needed.

## 2022-07-15 ENCOUNTER — Encounter: Payer: Self-pay | Admitting: Neurology

## 2022-07-15 ENCOUNTER — Ambulatory Visit (INDEPENDENT_AMBULATORY_CARE_PROVIDER_SITE_OTHER): Payer: 59 | Admitting: Neurology

## 2022-07-15 VITALS — BP 126/72 | HR 62 | Ht 60.0 in | Wt 134.4 lb

## 2022-07-15 DIAGNOSIS — R0683 Snoring: Secondary | ICD-10-CM

## 2022-07-15 DIAGNOSIS — Z9189 Other specified personal risk factors, not elsewhere classified: Secondary | ICD-10-CM

## 2022-07-15 DIAGNOSIS — E663 Overweight: Secondary | ICD-10-CM

## 2022-07-15 DIAGNOSIS — G4719 Other hypersomnia: Secondary | ICD-10-CM

## 2022-07-15 NOTE — Patient Instructions (Signed)

## 2022-07-15 NOTE — Progress Notes (Signed)
Subjective:    Patient ID: Katie Gardner is a 67 y.o. female.  HPI    Huston Foley, MD, PhD [facility] [address], [address] [address] [place], [place] [address]  Dear Dr. Alvy Bimler,  I saw your patient, Katie Gardner, upon your kind request in my sleep clinic today for initial consultation of her sleep disorder, in particular, concern for underlying obstructive sleep apnea.  The patient is unaccompanied today.  As you know, Katie Gardner is a 67 year old female with an underlying medical history of allergies, hypertension, hyperlipidemia, anxiety, depression, and mildly overweight state, who reports snoring and excessive daytime somnolence as well as sleep disruption.  Her Epworth sleepiness score is 3 out of 24, fatigue severity score is 44 out of 63. I reviewed your office note from 06/09/2022.she is divorced and lives alone, she has no significant difficulty falling asleep but has difficulty staying asleep, she often wakes up in the early morning hours and has trouble falling asleep, she may get out of bed and do other things.  She limits her caffeine to 1 cup of coffee in the morning.  Her weight has been more or less stable.  She does not have a TV in her bedroom.  She has 1 cat in the household.  She denies recurrent morning headaches or night to night nocturia.  She retired about 3 to 4 years ago, she worked as a Optician, dispensing at a facility, also as a Comptroller.  She has 1 grandson, 2 grandchildren and 6 great-grandchildren.  She is a non-smoker, she smoked in her early adulthood, quit at age 12.  She drinks alcohol occasionally.  Bedtime is generally around 11:30 PM and rise time generally between 7:30 AM and 9 AM.  Her Past Medical History Is Significant For: Past Medical History:  Diagnosis Date   Allergy    seasonal   Anxiety    Depression    Family history of adverse reaction to anesthesia    sister hard to wake up   Hematuria    Hot flashes    Hx of  cardiovascular stress test    Lex MV 12/13:  EF 68%, mild apical thinning, no ischemia   Hyperlipidemia    Hypertension    PONV (postoperative nausea and vomiting)     Her Past Surgical History Is Significant For: Past Surgical History:  Procedure Laterality Date   BREAST LUMPECTOMY     right breast, not cancer   Carpel tunnel     ECTOPIC PREGNANCY SURGERY      Her Family History Is Significant For: Family History  Problem Relation Age of Onset   Heart disease Mother    Diabetes Mother    Esophageal cancer Father        77's   Diabetes Sister    Diabetes Sister    Colon cancer Neg Hx     Her Social History Is Significant For: Social History   Socioeconomic History   Marital status: Legally Separated    Spouse name: Not on file   Number of children: 1   Years of education: 8th grade   Highest education level: Not on file  Occupational History    Employer: UNEMPLOYED  Tobacco Use   Smoking status: Former    Types: Cigarettes   Smokeless tobacco: Never  Vaping Use   Vaping Use: Never used  Substance and Sexual Activity   Alcohol use: No   Drug use: No   Sexual activity: Yes  Other  Topics Concern   Not on file  Social History Narrative   Caffiene coffee 1 cup   Live alone.   Work" retired   Chemical engineer Strain: Low Risk  (06/03/2022)   Overall Financial Resource Strain (CARDIA)    Difficulty of Paying Living Expenses: Not hard at all  Food Insecurity: No Food Insecurity (06/03/2022)   Hunger Vital Sign    Worried About Running Out of Food in the Last Year: Never true    Ran Out of Food in the Last Year: Never true  Transportation Needs: No Transportation Needs (06/03/2022)   PRAPARE - Administrator, Civil Service (Medical): No    Lack of Transportation (Non-Medical): No  Physical Activity: Inactive (06/03/2022)   Exercise Vital Sign    Days of Exercise per Week: 0 days    Minutes of Exercise per Session:  0 min  Stress: No Stress Concern Present (06/03/2022)   [employer] of Occupational Health - Occupational Stress Questionnaire    Feeling of Stress : Not at all  Social Connections: Socially Isolated (06/03/2022)   Social Connection and Isolation Panel [NHANES]    Frequency of Communication with Friends and Family: Once a week    Frequency of Social Gatherings with Friends and Family: Once a week    Attends Religious Services: Never    Database administrator or Organizations: No    Attends Engineer, structural: Never    Marital Status: Never married    Her Allergies Are:  Allergies  Allergen Reactions   Macrobid [Nitrofurantoin] Nausea And Vomiting   Hydrocodone Nausea And Vomiting    dizzy   Mucinex Dm [Dm-Guaifenesin Er] Swelling    Sob, throat closing  :   Her Current Medications Are:  Outpatient Encounter Medications as of 07/15/2022  Medication Sig   ALPRAZolam (XANAX) 0.5 MG tablet TAKE 1 TABLET(0.5 MG) BY MOUTH TWICE DAILY AS NEEDED FOR ANXIETY   cyclobenzaprine (FLEXERIL) 5 MG tablet TAKE 1 TABLET(5 MG) BY MOUTH THREE TIMES DAILY AS NEEDED FOR MUSCLE SPASMS   FLUoxetine (PROZAC) 10 MG tablet Take 1 tablet (10 mg total) by mouth daily.   fluticasone (FLONASE) 50 MCG/ACT nasal spray SHAKE LIQUID AND USE 2 SPRAYS IN EACH NOSTRIL DAILY   ibuprofen (ADVIL) 600 MG tablet TAKE 1 TABLET(600 MG) BY MOUTH EVERY 8 HOURS AS NEEDED FOR MODERATE PAIN   lisinopril (ZESTRIL) 10 MG tablet TAKE 1 TABLET(10 MG) BY MOUTH DAILY   loratadine (CLARITIN) 10 MG tablet Take 1 tablet (10 mg total) by mouth daily.   rosuvastatin (CRESTOR) 10 MG tablet TAKE 1 TABLET(10 MG) BY MOUTH DAILY   [DISCONTINUED] benzonatate (TESSALON) 200 MG capsule Take 1 capsule (200 mg total) by mouth 2 (two) times daily as needed for cough.   [DISCONTINUED] cetirizine (ZYRTEC) 10 MG tablet TAKE 1 TABLET(10 MG) BY MOUTH DAILY   [DISCONTINUED] LORazepam (ATIVAN) 1 MG tablet Sig 1 mg tablet 1 hour before  procedure   [DISCONTINUED] LORazepam (ATIVAN) 1 MG tablet Take 1 tablet (1 mg total) by mouth every 8 (eight) hours.   No facility-administered encounter medications on file as of 07/15/2022.  :   Review of Systems:  Out of a complete 14 point review of systems, all are reviewed and negative with the exception of these symptoms as listed below:   Review of Systems  Neurological:        NP / Internal / Wakes up tired. Snores. Daytime somnolence.  ESS 3,  FSS  44.       Objective:  Neurological Exam  Physical Exam Physical Examination:   Vitals:   07/15/22 1122  BP: 126/72  Pulse: 62    General Examination: The patient is a very pleasant 67 y.o. female in no acute distress. She appears well-developed and well-nourished and well groomed.   HEENT: Normocephalic, atraumatic, pupils are equal, round and reactive to light, extraocular tracking is good without limitation to gaze excursion or nystagmus noted. Hearing is grossly intact. Face is symmetric with normal facial animation. Speech is clear with no dysarthria noted. There is no hypophonia. There is no lip, neck/head, jaw or voice tremor. Neck is supple with full range of passive and active motion. There are no carotid bruits on auscultation. Oropharynx exam reveals: mild mouth dryness, adequate dental hygiene, she has 3 caps on the top 3 frontal teeth.  Missing a few teeth.  Airway crowding is mild, she is a smoker airway entry, tonsils on the smaller side, somewhat redundant soft palate and wider uvula.  Mallampati class III.  Neck circumference 15 inches, mild overbite noted.  Tongue protrudes centrally and palate elevates symmetrically.    Chest: Clear to auscultation without wheezing, rhonchi or crackles noted.  Heart: S1+S2+0, regular and normal without murmurs, rubs or gallops noted.   Abdomen: Soft, non-tender and non-distended.  Extremities: There is no pitting edema in the distal lower extremities bilaterally.   Skin:  Warm and dry without trophic changes noted.   Musculoskeletal: exam reveals no obvious joint deformities.   Neurologically:  Mental status: The patient is awake, alert and oriented in all 4 spheres. Her immediate and remote memory, attention, language skills and fund of knowledge are appropriate. There is no evidence of aphasia, agnosia, apraxia or anomia. Speech is clear with normal prosody and enunciation. Thought process is linear. Mood is normal and affect is normal.  Cranial nerves II - XII are as described above under HEENT exam.  Motor exam: Normal bulk, strength and tone is noted. There is no obvious action or resting tremor.  Fine motor skills and coordination: grossly intact.  Cerebellar testing: No dysmetria or intention tremor. There is no truncal or gait ataxia.  Sensory exam: intact to light touch in the upper and lower extremities.  Gait, station and balance: She stands easily. No veering to one side is noted. No leaning to one side is noted. Posture is age-appropriate and stance is narrow based. Gait shows normal stride length and normal pace. No problems turning are noted.   Assessment and Plan:  In summary, Katie Gardner is a very pleasant 67 y.o.-year old female with an underlying medical history of allergies, hypertension, hyperlipidemia, anxiety, depression, and mildly overweight state, whose history and physical exam are concerning for sleep disordered breathing, particularly obstructive sleep apnea (OSA). A laboratory attended sleep study is typically considered "gold standard" for evaluation of sleep disordered breathing.   I had a long chat with the patient about my findings and the diagnosis of sleep apnea, particularly OSA, its prognosis and treatment options. We talked about medical/conservative treatments, surgical interventions and non-pharmacological approaches for symptom control. I explained, in particular, the risks and ramifications of untreated moderate to severe  OSA, especially with respect to developing cardiovascular disease down the road, including congestive heart failure (CHF), difficult to treat hypertension, cardiac arrhythmias (particularly A-fib), neurovascular complications including TIA, stroke and dementia. Even type 2 diabetes has, in part, been linked to untreated OSA. Symptoms of untreated  OSA may include (but may not be limited to) daytime sleepiness, nocturia (i.e. frequent nighttime urination), memory problems, mood irritability and suboptimally controlled or worsening mood disorder such as depression and/or anxiety, lack of energy, lack of motivation, physical discomfort, as well as recurrent headaches, especially morning or nocturnal headaches. We talked about the importance of maintaining a healthy lifestyle and striving for healthy weight.  In addition, we talked about the importance of striving for and maintaining good sleep hygiene. I recommended a sleep study at this time. I outlined the differences between a laboratory attended sleep study which is considered more comprehensive and accurate over the option of a home sleep test (HST); the latter may lead to underestimation of sleep disordered breathing in some instances and does not help with diagnosing upper airway resistance syndrome and is not accurate enough to diagnose primary central sleep apnea typically. I outlined possible surgical and non-surgical treatment options of OSA, including the use of a positive airway pressure (PAP) device (i.e. CPAP, AutoPAP/APAP or BiPAP in certain circumstances), a custom-made dental device (aka oral appliance, which would require a referral to a specialist dentist or orthodontist typically, and is generally speaking not considered for patients with full dentures or edentulous state), upper airway surgical options, such as traditional UPPP (which is not considered a first-line treatment) or the Inspire device (hypoglossal nerve stimulator, which would  involve a referral for consultation with an ENT surgeon, after careful selection, following inclusion criteria - also not first-line treatment). I explained the PAP treatment option to the patient in detail, as this is generally considered first-line treatment.  The patient indicated that she would be reluctant but willing to try PAP therapy, if the need arises. I explained the importance of being compliant with PAP treatment, not only for insurance purposes but primarily to improve patient's symptoms symptoms, and for the patient's long term health benefit, including to reduce Her cardiovascular risks longer-term.    We will pick up our discussion about the next steps and treatment options after testing.  We will keep her posted as to the test results by phone call and/or MyChart messaging where possible.  We will plan to follow-up in sleep clinic accordingly as well.  I answered all her questions today and the patient was in agreement.   I encouraged her to call with any interim questions, concerns, problems or updates or email [place] through MyChart.  Generally speaking, sleep test authorizations may take up to 2 weeks, sometimes less, sometimes longer, the patient is encouraged to get in touch with [place] if they do not hear back from the sleep lab staff directly within the next 2 weeks.  Thank you very much for allowing me to participate in the care of this nice patient. If I can be of any further assistance to you please do not hesitate to call me at [phone].  Sincerely,   Huston Foley, MD, PhD

## 2022-07-16 DIAGNOSIS — Z780 Asymptomatic menopausal state: Secondary | ICD-10-CM | POA: Diagnosis not present

## 2022-07-21 NOTE — Progress Notes (Signed)
Referring-Miguel Victorino December MD Reason for referral-chest pain  HPI: 67 year old female for evaluation of chest pain at request of Georgina Quint MD. patient seen previously but not since 2013.  Patient had a normal stress nuclear study 2013.  Patient seen in the emergency room April 2024 with complaints of chest pain.  Found to be COVID-positive.  Troponins were normal.  Chest x-ray without acute infiltrates.  Creatinine 0.81 and potassium 4.1.  Liver functions normal.  Hemoglobin 13.  Her symptoms improved with Valium.  Cardiology now asked to evaluate.  Patient states that she has had occasional pain in her chest when she is "stressed".  It lasts seconds and resolves.  It does not radiate and there are no associated symptoms.  She has not had exertional chest pain, dyspnea on exertion, orthopnea, PND, pedal edema, syncope.  Cardiology now asked to evaluate.  Current Outpatient Medications  Medication Sig Dispense Refill   ALPRAZolam (XANAX) 0.5 MG tablet TAKE 1 TABLET(0.5 MG) BY MOUTH TWICE DAILY AS NEEDED FOR ANXIETY 30 tablet 1   cyclobenzaprine (FLEXERIL) 5 MG tablet TAKE 1 TABLET(5 MG) BY MOUTH THREE TIMES DAILY AS NEEDED FOR MUSCLE SPASMS 30 tablet 1   FLUoxetine (PROZAC) 10 MG tablet Take 1 tablet (10 mg total) by mouth daily. 90 tablet 3   fluticasone (FLONASE) 50 MCG/ACT nasal spray SHAKE LIQUID AND USE 2 SPRAYS IN EACH NOSTRIL DAILY 48 g 0   ibuprofen (ADVIL) 600 MG tablet TAKE 1 TABLET(600 MG) BY MOUTH EVERY 8 HOURS AS NEEDED FOR MODERATE PAIN 30 tablet 1   lisinopril (ZESTRIL) 10 MG tablet TAKE 1 TABLET(10 MG) BY MOUTH DAILY 90 tablet 3   loratadine (CLARITIN) 10 MG tablet Take 1 tablet (10 mg total) by mouth daily. 30 tablet 11   rosuvastatin (CRESTOR) 10 MG tablet TAKE 1 TABLET(10 MG) BY MOUTH DAILY 90 tablet 1   No current facility-administered medications for this visit.    Allergies  Allergen Reactions   Macrobid [Nitrofurantoin] Nausea And Vomiting    Hydrocodone Nausea And Vomiting    dizzy   Mucinex Dm [Dm-Guaifenesin Er] Swelling    Sob, throat closing     Past Medical History:  Diagnosis Date   Allergy    seasonal   Anxiety    Depression    Family history of adverse reaction to anesthesia    sister hard to wake up   Hematuria    Hot flashes    Hx of cardiovascular stress test    Lex MV 12/13:  EF 68%, mild apical thinning, no ischemia   Hyperlipidemia    Hypertension    PONV (postoperative nausea and vomiting)     Past Surgical History:  Procedure Laterality Date   BREAST LUMPECTOMY     right breast, not cancer   Carpel tunnel     ECTOPIC PREGNANCY SURGERY      Social History   Socioeconomic History   Marital status: Divorced    Spouse name: Not on file   Number of children: 1   Years of education: 8th grade   Highest education level: Not on file  Occupational History    Employer: UNEMPLOYED  Tobacco Use   Smoking status: Former    Types: Cigarettes   Smokeless tobacco: Never  Vaping Use   Vaping Use: Never used  Substance and Sexual Activity   Alcohol use: Yes    Comment: Occasional   Drug use: No   Sexual activity: Yes  Other Topics  Concern   Not on file  Social History Narrative   Caffiene coffee 1 cup   Live alone.   Work" retired   Chemical engineer Strain: Low Risk  (06/03/2022)   Overall Financial Resource Strain (CARDIA)    Difficulty of Paying Living Expenses: Not hard at all  Food Insecurity: No Food Insecurity (06/03/2022)   Hunger Vital Sign    Worried About Running Out of Food in the Last Year: Never true    Ran Out of Food in the Last Year: Never true  Transportation Needs: No Transportation Needs (06/03/2022)   PRAPARE - Administrator, Civil Service (Medical): No    Lack of Transportation (Non-Medical): No  Physical Activity: Inactive (06/03/2022)   Exercise Vital Sign    Days of Exercise per Week: 0 days    Minutes of Exercise  per Session: 0 min  Stress: No Stress Concern Present (06/03/2022)   [employer] of Occupational Health - Occupational Stress Questionnaire    Feeling of Stress : Not at all  Social Connections: Socially Isolated (06/03/2022)   Social Connection and Isolation Panel [NHANES]    Frequency of Communication with Friends and Family: Once a week    Frequency of Social Gatherings with Friends and Family: Once a week    Attends Religious Services: Never    Database administrator or Organizations: No    Attends Banker Meetings: Never    Marital Status: Never married  Intimate Partner Violence: Not At Risk (06/03/2022)   Humiliation, Afraid, Rape, and Kick questionnaire    Fear of Current or Ex-Partner: No    Emotionally Abused: No    Physically Abused: No    Sexually Abused: No    Family History  Problem Relation Age of Onset   Heart disease Mother    Diabetes Mother    Esophageal cancer Father        5's   Diabetes Sister    Diabetes Sister    Colon cancer Neg Hx     ROS: no fevers or chills, productive cough, hemoptysis, dysphasia, odynophagia, melena, hematochezia, dysuria, hematuria, rash, seizure activity, orthopnea, PND, pedal edema, claudication. Remaining systems are negative.  Physical Exam:   Blood pressure 126/70, pulse 77, height 5' (1.524 m), weight 134 lb 9.6 oz (61.1 kg), SpO2 97 %.  General:  Well developed/well nourished in NAD Skin warm/dry Patient not depressed No peripheral clubbing Back-normal HEENT-normal/normal eyelids Neck supple/normal carotid upstroke bilaterally; no bruits; no JVD; no thyromegaly chest - CTA/ normal expansion CV - RRR/normal S1 and S2; no murmurs, rubs or gallops;  PMI nondisplaced Abdomen -NT/ND, no HSM, no mass, + bowel sounds, no bruit 2+ femoral pulses, no bruits Ext-no edema, chords, 2+ DP Neuro-grossly nonfocal  ECG -June 22, 2022-normal sinus rhythm with minor nonspecific ST changes.  Personally  reviewed  A/P  1 chest pain-symptoms are very.  However she is very concerned about these.  Will arrange cardiac CTA to rule out obstructive coronary disease.  2 hyperlipidemia-continue statin.  3 hypertension-blood pressure controlled.  Continue present medications.  Olga Millers, MD

## 2022-07-28 ENCOUNTER — Other Ambulatory Visit: Payer: Self-pay | Admitting: Emergency Medicine

## 2022-07-28 ENCOUNTER — Encounter: Payer: Self-pay | Admitting: Cardiology

## 2022-07-28 ENCOUNTER — Ambulatory Visit: Payer: 59 | Attending: Cardiology | Admitting: Cardiology

## 2022-07-28 VITALS — BP 126/70 | HR 77 | Ht 60.0 in | Wt 134.6 lb

## 2022-07-28 DIAGNOSIS — F32A Depression, unspecified: Secondary | ICD-10-CM

## 2022-07-28 DIAGNOSIS — R072 Precordial pain: Secondary | ICD-10-CM | POA: Diagnosis not present

## 2022-07-28 DIAGNOSIS — F418 Other specified anxiety disorders: Secondary | ICD-10-CM

## 2022-07-28 DIAGNOSIS — I10 Essential (primary) hypertension: Secondary | ICD-10-CM | POA: Diagnosis not present

## 2022-07-28 DIAGNOSIS — E785 Hyperlipidemia, unspecified: Secondary | ICD-10-CM

## 2022-07-28 MED ORDER — METOPROLOL TARTRATE 100 MG PO TABS: ORAL_TABLET | ORAL | 0 refills | Status: DC

## 2022-07-28 NOTE — Patient Instructions (Signed)
    Testing/Procedures:   Your cardiac CT will be scheduled at   [facility] [address] [place], [place] [address] [phone]   If scheduled at [facility], please arrive at the [facility] Entrance (Entrance C2) of [facility] 30 minutes prior to test start time. You can use the FREE valet parking offered at entrance C (encouraged to control the heart rate for the test)  Proceed to the [facility] Radiology Department (first floor) to check-in and test prep.  All radiology patients and guests should use entrance C2 at [facility], accessed from [facility], even though the hospital's physical address listed is [address]      Please follow these instructions carefully (unless otherwise directed):    On the Night Before the Test: Be sure to Drink plenty of water. Do not consume any caffeinated/decaffeinated beverages or chocolate 12 hours prior to your test. Do not take any antihistamines 12 hours prior to your test.   On the Day of the Test: Drink plenty of water until 1 hour prior to the test. Do not eat any food 1 hour prior to test. You may take your regular medications prior to the test.  Take metoprolol (Lopressor) 100 mg two hours prior to test. FEMALES- please wear underwire-free bra if available, avoid dresses & tight clothing       After the Test: Drink plenty of water. After receiving IV contrast, you may experience a mild flushed feeling. This is normal. On occasion, you may experience a mild rash up to 24 hours after the test. This is not dangerous. If this occurs, you can take Benadryl 25 mg and increase your fluid intake. If you experience trouble breathing, this can be serious. If it is severe call 911 IMMEDIATELY. If it is mild, please call our office.   We will call to schedule your test 2-4 weeks out understanding that some insurance companies will need an authorization  prior to the service being performed.   For non-scheduling related questions, please contact the cardiac imaging nurse navigator should you have any questions/concerns: Rockwell [place], Cardiac Imaging Nurse Navigator Larey Brick, Cardiac Imaging Nurse Navigator [facility] Direct Office Dial: [phone]   For scheduling needs, including cancellations and rescheduling, please call [place], [phone].    Follow-Up: At [facility], you and your health needs are our priority.  As part of our continuing mission to provide you with exceptional heart care, we have created designated Provider Care Teams.  These Care Teams include your primary Cardiologist (physician) and Advanced Practice Providers (APPs -  Physician Assistants and Nurse Practitioners) who all work together to provide you with the care you need, when you need it.  We recommend signing up for the patient portal called "MyChart".  Sign up information is provided on this After Visit Summary.  MyChart is used to connect with patients for Virtual Visits (Telemedicine).  Patients are able to view lab/test results, encounter notes, upcoming appointments, etc.  Non-urgent messages can be sent to your provider as well.   To learn more about what you can do with MyChart, go to [email or web address].    Your next appointment:   As needed

## 2022-07-29 ENCOUNTER — Other Ambulatory Visit: Payer: Self-pay | Admitting: Cardiology

## 2022-07-29 ENCOUNTER — Other Ambulatory Visit: Payer: Self-pay | Admitting: Emergency Medicine

## 2022-07-29 DIAGNOSIS — F419 Anxiety disorder, unspecified: Secondary | ICD-10-CM

## 2022-07-29 DIAGNOSIS — F418 Other specified anxiety disorders: Secondary | ICD-10-CM

## 2022-07-29 MED ORDER — ALPRAZOLAM 0.5 MG PO TABS
0.5000 mg | ORAL_TABLET | Freq: Every day | ORAL | 1 refills | Status: DC | PRN
Start: 2022-07-29 — End: 2022-09-21

## 2022-07-29 NOTE — Telephone Encounter (Signed)
Medication filled 14 days ago

## 2022-07-30 ENCOUNTER — Telehealth: Payer: Self-pay | Admitting: Neurology

## 2022-07-30 NOTE — Telephone Encounter (Signed)
5/23:[id number] 07/15/22 [employer] medicare/medicaid no auth req EE

## 2022-08-19 ENCOUNTER — Ambulatory Visit (INDEPENDENT_AMBULATORY_CARE_PROVIDER_SITE_OTHER): Payer: 59 | Admitting: Emergency Medicine

## 2022-08-19 ENCOUNTER — Encounter: Payer: Self-pay | Admitting: Emergency Medicine

## 2022-08-19 VITALS — BP 132/74 | HR 65 | Temp 97.9°F | Ht 60.0 in | Wt 135.1 lb

## 2022-08-19 DIAGNOSIS — E785 Hyperlipidemia, unspecified: Secondary | ICD-10-CM | POA: Diagnosis not present

## 2022-08-19 DIAGNOSIS — I10 Essential (primary) hypertension: Secondary | ICD-10-CM | POA: Diagnosis not present

## 2022-08-19 DIAGNOSIS — Z1159 Encounter for screening for other viral diseases: Secondary | ICD-10-CM

## 2022-08-19 DIAGNOSIS — R29818 Other symptoms and signs involving the nervous system: Secondary | ICD-10-CM

## 2022-08-19 DIAGNOSIS — Z1211 Encounter for screening for malignant neoplasm of colon: Secondary | ICD-10-CM

## 2022-08-19 DIAGNOSIS — F419 Anxiety disorder, unspecified: Secondary | ICD-10-CM | POA: Diagnosis not present

## 2022-08-19 LAB — CBC WITH DIFFERENTIAL/PLATELET
Basophils Absolute: 0 10*3/uL (ref 0.0–0.1)
Basophils Relative: 0.5 % (ref 0.0–3.0)
Eosinophils Absolute: 0.1 10*3/uL (ref 0.0–0.7)
Eosinophils Relative: 1.7 % (ref 0.0–5.0)
HCT: 39.9 % (ref 36.0–46.0)
Hemoglobin: 13.1 g/dL (ref 12.0–15.0)
Lymphocytes Relative: 44.3 % (ref 12.0–46.0)
Lymphs Abs: 2.3 10*3/uL (ref 0.7–4.0)
MCHC: 32.8 g/dL (ref 30.0–36.0)
MCV: 88.1 fl (ref 78.0–100.0)
Monocytes Absolute: 0.5 10*3/uL (ref 0.1–1.0)
Monocytes Relative: 10.4 % (ref 3.0–12.0)
Neutro Abs: 2.2 10*3/uL (ref 1.4–7.7)
Neutrophils Relative %: 43.1 % (ref 43.0–77.0)
Platelets: 216 10*3/uL (ref 150.0–400.0)
RBC: 4.53 Mil/uL (ref 3.87–5.11)
RDW: 13.5 % (ref 11.5–15.5)
WBC: 5.1 10*3/uL (ref 4.0–10.5)

## 2022-08-19 LAB — COMPREHENSIVE METABOLIC PANEL
ALT: 21 U/L (ref 0–35)
AST: 21 U/L (ref 0–37)
Albumin: 4.1 g/dL (ref 3.5–5.2)
Alkaline Phosphatase: 59 U/L (ref 39–117)
BUN: 18 mg/dL (ref 6–23)
CO2: 29 mEq/L (ref 19–32)
Calcium: 9.4 mg/dL (ref 8.4–10.5)
Chloride: 104 mEq/L (ref 96–112)
Creatinine, Ser: 0.76 mg/dL (ref 0.40–1.20)
GFR: 81.48 mL/min (ref >60.00)
Glucose, Bld: 105 mg/dL — ABNORMAL HIGH (ref 70–99)
Potassium: 4.8 mEq/L (ref 3.5–5.1)
Sodium: 138 mEq/L (ref 135–145)
Total Bilirubin: 0.3 mg/dL (ref 0.2–1.2)
Total Protein: 7.1 g/dL (ref 6.0–8.3)

## 2022-08-19 LAB — LIPID PANEL
Cholesterol: 189 mg/dL (ref 0–200)
HDL: 65.8 mg/dL (ref >39.00)
LDL Cholesterol: 106 mg/dL — ABNORMAL HIGH (ref 0–99)
NonHDL: 122.85
Total CHOL/HDL Ratio: 3
Triglycerides: 86 mg/dL (ref 0.0–149.0)
VLDL: 17.2 mg/dL (ref 0.0–40.0)

## 2022-08-19 NOTE — Assessment & Plan Note (Signed)
Recently evaluated at sleep apnea clinic. Scheduled for sleep study next August

## 2022-08-19 NOTE — Patient Instructions (Signed)
Health Maintenance After Age 67 After age 67, you are at a higher risk for certain long-term diseases and infections as well as injuries from falls. Falls are a major cause of broken bones and head injuries in people who are older than age 67. Getting regular preventive care can help to keep you healthy and well. Preventive care includes getting regular testing and making lifestyle changes as recommended by your health care provider. Talk with your health care provider about: Which screenings and tests you should have. A screening is a test that checks for a disease when you have no symptoms. A diet and exercise plan that is right for you. What should I know about screenings and tests to prevent falls? Screening and testing are the best ways to find a health problem early. Early diagnosis and treatment give you the best chance of managing medical conditions that are common after age 67. Certain conditions and lifestyle choices may make you more likely to have a fall. Your health care provider may recommend: Regular vision checks. Poor vision and conditions such as cataracts can make you more likely to have a fall. If you wear glasses, make sure to get your prescription updated if your vision changes. Medicine review. Work with your health care provider to regularly review all of the medicines you are taking, including over-the-counter medicines. Ask your health care provider about any side effects that may make you more likely to have a fall. Tell your health care provider if any medicines that you take make you feel dizzy or sleepy. Strength and balance checks. Your health care provider may recommend certain tests to check your strength and balance while standing, walking, or changing positions. Foot health exam. Foot pain and numbness, as well as not wearing proper footwear, can make you more likely to have a fall. Screenings, including: Osteoporosis screening. Osteoporosis is a condition that causes  the bones to get weaker and break more easily. Blood pressure screening. Blood pressure changes and medicines to control blood pressure can make you feel dizzy. Depression screening. You may be more likely to have a fall if you have a fear of falling, feel depressed, or feel unable to do activities that you used to do. Alcohol use screening. Using too much alcohol can affect your balance and may make you more likely to have a fall. Follow these instructions at home: Lifestyle Do not drink alcohol if: Your health care provider tells you not to drink. If you drink alcohol: Limit how much you have to: 0-1 drink a day for women. 0-2 drinks a day for men. Know how much alcohol is in your drink. In the [place] one drink equals one 12 oz bottle of beer (355 mL), one 5 oz glass of wine (148 mL), or one 1 oz glass of hard liquor (44 mL). Do not use any products that contain nicotine or tobacco. These products include cigarettes, chewing tobacco, and vaping devices, such as e-cigarettes. If you need help quitting, ask your health care provider. Activity  Follow a regular exercise program to stay fit. This will help you maintain your balance. Ask your health care provider what types of exercise are appropriate for you. If you need a cane or walker, use it as recommended by your health care provider. Wear supportive shoes that have nonskid soles. Safety  Remove any tripping hazards, such as rugs, cords, and clutter. Install safety equipment such as grab bars in bathrooms and safety rails on stairs. Keep rooms and walkways   well-lit. General instructions Talk with your health care provider about your risks for falling. Tell your health care provider if: You fall. Be sure to tell your health care provider about all falls, even ones that seem minor. You feel dizzy, tiredness (fatigue), or off-balance. Take over-the-counter and prescription medicines only as told by your health care provider. These include  supplements. Eat a healthy diet and maintain a healthy weight. A healthy diet includes low-fat dairy products, low-fat (lean) meats, and fiber from whole grains, beans, and lots of fruits and vegetables. Stay current with your vaccines. Schedule regular health, dental, and eye exams. Summary Having a healthy lifestyle and getting preventive care can help to protect your health and wellness after age 67. Screening and testing are the best way to find a health problem early and help you avoid having a fall. Early diagnosis and treatment give you the best chance for managing medical conditions that are more common for people who are older than age 67. Falls are a major cause of broken bones and head injuries in people who are older than age 67. Take precautions to prevent a fall at home. Work with your health care provider to learn what changes you can make to improve your health and wellness and to prevent falls. This information is not intended to replace advice given to you by your health care provider. Make sure you discuss any questions you have with your health care provider. Document Revised: 07/15/2020 Document Reviewed: 07/15/2020 Elsevier Patient Education  2024 Elsevier Inc.  

## 2022-08-19 NOTE — Assessment & Plan Note (Signed)
Presently stable. Uses alprazolam 0.5 mg as needed

## 2022-08-19 NOTE — Progress Notes (Signed)
Katie Gardner 67 y.o.   Chief Complaint  Patient presents with   Medical Management of Chronic Issues    f/u appt, no concerns     HISTORY OF PRESENT ILLNESS: This is a 67 y.o. female here for follow-up of chronic medical conditions Overall doing well. Has no comp pains or medical concerns today.  BP Readings from Last 3 Encounters:  08/19/22 132/74  07/28/22 126/70  07/15/22 126/72   Wt Readings from Last 3 Encounters:  08/19/22 135 lb 2 oz (61.3 kg)  07/28/22 134 lb 9.6 oz (61.1 kg)  07/15/22 134 lb 6.4 oz (61 kg)     HPI   [name]   Medication Sig Start Date End Date Taking? Authorizing Provider  ALPRAZolam Prudy Feeler) 0.5 MG tablet Take 1 tablet (0.5 mg total) by mouth daily as needed for anxiety. 07/29/22  Yes Jovahn Breit, Eilleen Kempf, MD  cyclobenzaprine (FLEXERIL) 5 MG tablet TAKE 1 TABLET(5 MG) BY MOUTH THREE TIMES DAILY AS NEEDED FOR MUSCLE SPASMS 09/16/21  Yes Lesa Vandall, Eilleen Kempf, MD  FLUoxetine (PROZAC) 10 MG tablet Take 1 tablet (10 mg total) by mouth daily. 05/04/22  Yes Victory Dresden, Eilleen Kempf, MD  fluticasone [facility]) 50 MCG/ACT nasal spray SHAKE LIQUID AND USE 2 SPRAYS IN EACH NOSTRIL DAILY 06/25/22  Yes Henson, Vickie L, NP-C  ibuprofen (ADVIL) 600 MG tablet TAKE 1 TABLET(600 MG) BY MOUTH EVERY 8 HOURS AS NEEDED FOR MODERATE PAIN 05/14/22  Yes Georgina Quint, MD  lisinopril (ZESTRIL) 10 MG tablet TAKE 1 TABLET(10 MG) BY MOUTH DAILY 11/15/21  Yes Willistine Ferrall, Eilleen Kempf, MD  loratadine (CLARITIN) 10 MG tablet Take 1 tablet (10 mg total) by mouth daily. 09/12/18  Yes Georgina Quint, MD  metoprolol tartrate (LOPRESSOR) 100 MG tablet Take 2 hours prior to CT scan 07/28/22  Yes Crenshaw, Madolyn Frieze, MD  rosuvastatin (CRESTOR) 10 MG tablet TAKE 1 TABLET(10 MG) BY MOUTH DAILY 04/01/21  Yes Ireland Virrueta, Eilleen Kempf, MD    Allergies  Allergen Reactions   Macrobid [Nitrofurantoin] Nausea And Vomiting   Hydrocodone Nausea And Vomiting    dizzy    Mucinex Dm [Dm-Guaifenesin Er] Swelling    Sob, throat closing    Patient Active Problem List   Diagnosis Date Noted   Tiredness 06/09/2022   Suspected sleep apnea 06/09/2022   Arm pain, musculoskeletal, left 05/04/2022   Urinary incontinence without sensory awareness 03/05/2022   Chronic pain of left knee 02/17/2022   Stress incontinence in female 02/17/2022   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   Dyspareunia in female 09/13/2019   Skin tags, multiple acquired 07/27/2019   Chronic insomnia 05/31/2019   Essential hypertension 12/17/2017   Seasonal allergies 12/17/2017   Anxiety and depression 11/13/2016   Chronic anxiety 11/13/2016   Chronic sore throat 11/13/2016   Hot flashes    Situational anxiety    Dyslipidemia     Past Medical History:  Diagnosis Date   Allergy    seasonal   Anxiety    Depression    Family history of adverse reaction to anesthesia    sister hard to wake up   Hematuria    Hot flashes    Hx of cardiovascular stress test    Lex MV 12/13:  EF 68%, mild apical thinning, no ischemia   Hyperlipidemia    Hypertension    PONV (postoperative nausea and vomiting)     Past Surgical History:  Procedure Laterality Date   BREAST LUMPECTOMY     right breast,  not cancer   Carpel tunnel     ECTOPIC PREGNANCY SURGERY      Social History   Socioeconomic History   Marital status: Divorced    Spouse name: Not on file   Number of children: 1   Years of education: 8th grade   Highest education level: Not on file  Occupational History    Employer: UNEMPLOYED  Tobacco Use   Smoking status: Former    Types: Cigarettes   Smokeless tobacco: Never  Vaping Use   Vaping Use: Never used  Substance and Sexual Activity   Alcohol use: Yes    Comment: Occasional   Drug use: No   Sexual activity: Yes  Other Topics Concern   Not on file  Social History Narrative   Caffiene coffee 1 cup   Live alone.   Work" retired   Electronics engineer Strain: Low Risk  (06/03/2022)   Overall Financial Resource Strain (CARDIA)    Difficulty of Paying Living Expenses: Not hard at all  Food Insecurity: No Food Insecurity (06/03/2022)   Hunger Vital Sign    Worried About Running Out of Food in the Last Year: Never true    Ran Out of Food in the Last Year: Never true  Transportation Needs: No Transportation Needs (06/03/2022)   PRAPARE - Administrator, Civil Service (Medical): No    Lack of Transportation (Non-Medical): No  Physical Activity: Inactive (06/03/2022)   Exercise Vital Sign    Days of Exercise per Week: 0 days    Minutes of Exercise per Session: 0 min  Stress: No Stress Concern Present (06/03/2022)   [employer] of Occupational Health - Occupational Stress Questionnaire    Feeling of Stress : Not at all  Social Connections: Socially Isolated (06/03/2022)   Social Connection and Isolation Panel [NHANES]    Frequency of Communication with Friends and Family: Once a week    Frequency of Social Gatherings with Friends and Family: Once a week    Attends Religious Services: Never    Database administrator or Organizations: No    Attends Banker Meetings: Never    Marital Status: Never married  Intimate Partner Violence: Not At Risk (06/03/2022)   Humiliation, Afraid, Rape, and Kick questionnaire    Fear of Current or Ex-Partner: No    Emotionally Abused: No    Physically Abused: No    Sexually Abused: No    Family History  Problem Relation Age of Onset   Heart disease Mother    Diabetes Mother    Esophageal cancer Father        18's   Diabetes Sister    Diabetes Sister    Colon cancer Neg Hx      Review of Systems  Constitutional: Negative.  Negative for chills and fever.  HENT: Negative.  Negative for congestion and sore throat.   Respiratory: Negative.  Negative for cough and shortness of breath.   Cardiovascular: Negative.  Negative for chest pain and  palpitations.  Gastrointestinal:  Negative for abdominal pain, diarrhea, nausea and vomiting.  Genitourinary: Negative.  Negative for dysuria and hematuria.  Skin: Negative.  Negative for rash.  Neurological: Negative.  Negative for dizziness and headaches.  All other systems reviewed and are negative.   Today's Vitals   08/19/22 0957  BP: 132/74  Pulse: 65  Temp: 97.9 F (36.6 C)  TempSrc: Oral  SpO2: 98%  Weight: 135 lb 2  oz (61.3 kg)  Height: 5' (1.524 m)   Body mass index is 26.39 kg/m.   Physical Exam Vitals reviewed.  Constitutional:      Appearance: Normal appearance.  HENT:     Head: Normocephalic.     Mouth/Throat:     Mouth: Mucous membranes are moist.     Pharynx: Oropharynx is clear.  Eyes:     Extraocular Movements: Extraocular movements intact.     Conjunctiva/sclera: Conjunctivae normal.     Pupils: Pupils are equal, round, and reactive to light.  Cardiovascular:     Rate and Rhythm: Normal rate and regular rhythm.     Pulses: Normal pulses.     Heart sounds: Normal heart sounds.  Pulmonary:     Effort: Pulmonary effort is normal.     Breath sounds: Normal breath sounds.  Abdominal:     Palpations: Abdomen is soft.     Tenderness: There is no abdominal tenderness.  Musculoskeletal:     Cervical back: No tenderness.     Right lower leg: No edema.     Left lower leg: No edema.  Lymphadenopathy:     Cervical: No cervical adenopathy.  Skin:    General: Skin is warm and dry.     Capillary Refill: Capillary refill takes less than 2 seconds.  Neurological:     General: No focal deficit present.     Mental Status: She is alert and oriented to person, place, and time.  Psychiatric:        Mood and Affect: Mood normal.        Behavior: Behavior normal.      ASSESSMENT & PLAN: A total of 44 minutes was spent with the patient and counseling/coordination of care regarding preparing for this visit, review of most recent office visit notes, review of  multiple chronic medical conditions under management, review of all medications, review of most recent blood work results, education on nutrition, cardiovascular risks associated with hypertension and dyslipidemia, prognosis, documentation, and need for follow-up  Problem List Items Addressed This Visit       Cardiovascular and Mediastinum   Essential hypertension - Primary    Well-controlled hypertension. Continue lisinopril 10 mg daily Recently evaluated by cardiologist.  Office visit notes reviewed. A/P   1 chest pain-symptoms are very.  However she is very concerned about these.  Will arrange cardiac CTA to rule out obstructive coronary disease.   2 hyperlipidemia-continue statin.   3 hypertension-blood pressure controlled.  Continue present medications.   Olga Millers, MD      Relevant Orders   CBC with Differential/Platelet   Comprehensive metabolic panel   Lipid panel     Other   Dyslipidemia    Diet and nutrition discussed Lipid profile done today Continue rosuvastatin 10 mg daily The 10-year ASCVD risk score (Arnett DK, et al., 2019) is: 8.2%   Values used to calculate the score:     Age: 22 years     Sex: Female     Is Non-Hispanic African American: No     Diabetic: No     Tobacco smoker: No     Systolic Blood Pressure: 132 mmHg     Is BP treated: Yes     HDL Cholesterol: 59.5 mg/dL     Total Cholesterol: 186 mg/dL       Relevant Orders   CBC with Differential/Platelet   Comprehensive metabolic panel   Lipid panel   Chronic anxiety    Presently stable. Uses alprazolam  0.5 mg as needed      Suspected sleep apnea    Recently evaluated at sleep apnea clinic. Scheduled for sleep study next August      Other Visit Diagnoses     Need for hepatitis C screening test       Relevant Orders   Hepatitis C antibody   Screening for colon cancer       Relevant Orders   Cologuard        Patient Instructions  Health Maintenance After Age 35 After  age 67, you are at a higher risk for certain long-term diseases and infections as well as injuries from falls. Falls are a major cause of broken bones and head injuries in people who are older than age 53. Getting regular preventive care can help to keep you healthy and well. Preventive care includes getting regular testing and making lifestyle changes as recommended by your health care provider. Talk with your health care provider about: Which screenings and tests you should have. A screening is a test that checks for a disease when you have no symptoms. A diet and exercise plan that is right for you. What should I know about screenings and tests to prevent falls? Screening and testing are the best ways to find a health problem early. Early diagnosis and treatment give you the best chance of managing medical conditions that are common after age 82. Certain conditions and lifestyle choices may make you more likely to have a fall. Your health care provider may recommend: Regular vision checks. Poor vision and conditions such as cataracts can make you more likely to have a fall. If you wear glasses, make sure to get your prescription updated if your vision changes. Medicine review. Work with your health care provider to regularly review all of the medicines you are taking, including over-the-counter medicines. Ask your health care provider about any side effects that may make you more likely to have a fall. Tell your health care provider if any medicines that you take make you feel dizzy or sleepy. Strength and balance checks. Your health care provider may recommend certain tests to check your strength and balance while standing, walking, or changing positions. Foot health exam. Foot pain and numbness, as well as not wearing proper footwear, can make you more likely to have a fall. Screenings, including: Osteoporosis screening. Osteoporosis is a condition that causes the bones to get weaker and break more  easily. Blood pressure screening. Blood pressure changes and medicines to control blood pressure can make you feel dizzy. Depression screening. You may be more likely to have a fall if you have a fear of falling, feel depressed, or feel unable to do activities that you used to do. Alcohol use screening. Using too much alcohol can affect your balance and may make you more likely to have a fall. Follow these instructions at home: Lifestyle Do not drink alcohol if: Your health care provider tells you not to drink. If you drink alcohol: Limit how much you have to: 0-1 drink a day for women. 0-2 drinks a day for men. Know how much alcohol is in your drink. In the [place] one drink equals one 12 oz bottle of beer (355 mL), one 5 oz glass of wine (148 mL), or one 1 oz glass of hard liquor (44 mL). Do not use any products that contain nicotine or tobacco. These products include cigarettes, chewing tobacco, and vaping devices, such as e-cigarettes. If you need help quitting, ask your  health care provider. Activity  Follow a regular exercise program to stay fit. This will help you maintain your balance. Ask your health care provider what types of exercise are appropriate for you. If you need a cane or walker, use it as recommended by your health care provider. Wear supportive shoes that have nonskid soles. Safety  Remove any tripping hazards, such as rugs, cords, and clutter. Install safety equipment such as grab bars in bathrooms and safety rails on stairs. Keep rooms and walkways well-lit. General instructions Talk with your health care provider about your risks for falling. Tell your health care provider if: You fall. Be sure to tell your health care provider about all falls, even ones that seem minor. You feel dizzy, tiredness (fatigue), or off-balance. Take over-the-counter and prescription medicines only as told by your health care provider. These include supplements. Eat a healthy diet and  maintain a healthy weight. A healthy diet includes low-fat dairy products, low-fat (lean) meats, and fiber from whole grains, beans, and lots of fruits and vegetables. Stay current with your vaccines. Schedule regular health, dental, and eye exams. Summary Having a healthy lifestyle and getting preventive care can help to protect your health and wellness after age 75. Screening and testing are the best way to find a health problem early and help you avoid having a fall. Early diagnosis and treatment give you the best chance for managing medical conditions that are more common for people who are older than age 70. Falls are a major cause of broken bones and head injuries in people who are older than age 50. Take precautions to prevent a fall at home. Work with your health care provider to learn what changes you can make to improve your health and wellness and to prevent falls. This information is not intended to replace advice given to you by your health care provider. Make sure you discuss any questions you have with your health care provider. Document Revised: 07/15/2020 Document Reviewed: 07/15/2020 Elsevier Patient Education  2024 Elsevier Inc.     Edwina Barth, MD [facility]

## 2022-08-19 NOTE — Assessment & Plan Note (Signed)
Well-controlled hypertension. Continue lisinopril 10 mg daily Recently evaluated by cardiologist.  Office visit notes reviewed. A/P   1 chest pain-symptoms are very.  However she is very concerned about these.  Will arrange cardiac CTA to rule out obstructive coronary disease.   2 hyperlipidemia-continue statin.   3 hypertension-blood pressure controlled.  Continue present medications.   Olga Millers, MD

## 2022-08-19 NOTE — Assessment & Plan Note (Signed)
Diet and nutrition discussed Lipid profile done today Continue rosuvastatin 10 mg daily The 10-year ASCVD risk score (Arnett DK, et al., 2019) is: 8.2%   Values used to calculate the score:     Age: 67 years     Sex: Female     Is Non-Hispanic African American: No     Diabetic: No     Tobacco smoker: No     Systolic Blood Pressure: 132 mmHg     Is BP treated: Yes     HDL Cholesterol: 59.5 mg/dL     Total Cholesterol: 186 mg/dL

## 2022-08-20 LAB — HEPATITIS C ANTIBODY: Hepatitis C Ab: NONREACTIVE

## 2022-08-25 ENCOUNTER — Telehealth (HOSPITAL_COMMUNITY): Payer: Self-pay | Admitting: Emergency Medicine

## 2022-08-25 NOTE — Telephone Encounter (Signed)
Pt wishes to r/s CCTA Rockwell [place] RN Navigator Cardiac Imaging [facility] [phone] Office  [phone] Cell

## 2022-08-26 ENCOUNTER — Ambulatory Visit (HOSPITAL_COMMUNITY): Payer: 59

## 2022-08-26 ENCOUNTER — Other Ambulatory Visit: Payer: Self-pay | Admitting: Family Medicine

## 2022-09-04 ENCOUNTER — Ambulatory Visit (HOSPITAL_COMMUNITY)
Admission: RE | Admit: 2022-09-04 | Discharge: 2022-09-04 | Disposition: A | Discharge status: Home / Self Care | Payer: 59 | Source: Ambulatory Visit | Attending: Cardiology | Admitting: Cardiology

## 2022-09-04 DIAGNOSIS — R072 Precordial pain: Secondary | ICD-10-CM | POA: Insufficient documentation

## 2022-09-04 DIAGNOSIS — R112 Nausea with vomiting, unspecified: Secondary | ICD-10-CM | POA: Diagnosis not present

## 2022-09-04 DIAGNOSIS — R066 Hiccough: Secondary | ICD-10-CM | POA: Diagnosis not present

## 2022-09-04 DIAGNOSIS — R9431 Abnormal electrocardiogram [ECG] [EKG]: Secondary | ICD-10-CM | POA: Diagnosis not present

## 2022-09-04 MED ORDER — NITROGLYCERIN 0.4 MG SL SUBL
0.8000 mg | SUBLINGUAL_TABLET | SUBLINGUAL | Status: DC | PRN
  Administered 2022-09-04: 0.8 mg via SUBLINGUAL

## 2022-09-04 MED ORDER — METOPROLOL TARTRATE 5 MG/5ML IV SOLN
10.0000 mg | INTRAVENOUS | Status: DC | PRN
  Administered 2022-09-04: 10 mg via INTRAVENOUS

## 2022-09-04 MED ORDER — METOPROLOL TARTRATE 5 MG/5ML IV SOLN
INTRAVENOUS | Status: AC
  Filled 2022-09-04: qty 10

## 2022-09-04 MED ORDER — NITROGLYCERIN 0.4 MG SL SUBL
SUBLINGUAL_TABLET | SUBLINGUAL | Status: AC
  Filled 2022-09-04: qty 2

## 2022-09-04 MED ORDER — IOHEXOL 350 MG/ML SOLN
95.0000 mL | Freq: Once | INTRAVENOUS | Status: AC | PRN
  Administered 2022-09-04: 95 mL via INTRAVENOUS

## 2022-09-04 NOTE — Progress Notes (Signed)
Pt verbalized understanding of discharge instruction; opportunity for questions provided ?

## 2022-09-08 ENCOUNTER — Telehealth: Payer: Self-pay | Admitting: Cardiology

## 2022-09-08 NOTE — Telephone Encounter (Signed)
Patient aware of Cardiac CT results. She will continue crestor. Verbalized understanding.

## 2022-09-08 NOTE — Telephone Encounter (Signed)
  Pt is calling to get CT result. She said, she saw it on mychart but cannot understand it

## 2022-09-19 ENCOUNTER — Other Ambulatory Visit: Payer: Self-pay | Admitting: Emergency Medicine

## 2022-09-19 DIAGNOSIS — F32A Depression, unspecified: Secondary | ICD-10-CM

## 2022-09-19 DIAGNOSIS — M25519 Pain in unspecified shoulder: Secondary | ICD-10-CM

## 2022-09-19 DIAGNOSIS — F418 Other specified anxiety disorders: Secondary | ICD-10-CM

## 2022-10-06 DIAGNOSIS — M67814 Other specified disorders of tendon, left shoulder: Secondary | ICD-10-CM | POA: Diagnosis not present

## 2022-10-06 DIAGNOSIS — M25512 Pain in left shoulder: Secondary | ICD-10-CM | POA: Diagnosis not present

## 2022-10-16 DIAGNOSIS — M67814 Other specified disorders of tendon, left shoulder: Secondary | ICD-10-CM | POA: Diagnosis not present

## 2022-10-16 DIAGNOSIS — M25512 Pain in left shoulder: Secondary | ICD-10-CM | POA: Diagnosis not present

## 2022-10-19 NOTE — Telephone Encounter (Signed)
Patient left a voicemail stating she needed to cancel her SS. I called her back but she did not pick up. I left a voicemail informing her that I did cancel her appt and left her my number to call back when she is ready to r/s.

## 2022-10-24 ENCOUNTER — Other Ambulatory Visit: Payer: Self-pay | Admitting: Emergency Medicine

## 2022-10-24 DIAGNOSIS — M25519 Pain in unspecified shoulder: Secondary | ICD-10-CM

## 2022-11-02 ENCOUNTER — Ambulatory Visit: Payer: 59 | Admitting: Emergency Medicine

## 2022-11-02 ENCOUNTER — Encounter: Payer: Self-pay | Admitting: Emergency Medicine

## 2022-11-02 VITALS — BP 136/72 | HR 83 | Temp 98.2°F | Ht 60.0 in | Wt 138.0 lb

## 2022-11-02 DIAGNOSIS — M25512 Pain in left shoulder: Secondary | ICD-10-CM | POA: Diagnosis not present

## 2022-11-02 MED ORDER — MELOXICAM 15 MG PO TABS
15.0000 mg | ORAL_TABLET | Freq: Every day | ORAL | 0 refills | Status: DC
Start: 2022-11-02 — End: 2022-11-12

## 2022-11-02 NOTE — Progress Notes (Signed)
Katie Gardner 66 y.o.   Chief Complaint  Patient presents with   Arm Pain    Patient states she is having arm and shoulder pain, been to urgent care twice and they done x ray     HISTORY OF PRESENT ILLNESS: Acute problem visit today. This is a 67 y.o. female complaining of left shoulder pain that started 4 weeks ago and not getting better Denies injury.  Was seen at urgent care center.  Had x-rays done. No other complaint or medical concerns today.  HPI   [name]   Medication Sig Start Date End Date Taking? Authorizing Provider  ALPRAZolam Prudy Feeler) 0.5 MG tablet TAKE 1 TABLET BY MOUTH EVERY DAY AS NEEDED ANXIETY 09/21/22  Yes Lloyd Cullinan, Eilleen Kempf, MD  cyclobenzaprine (FLEXERIL) 5 MG tablet TAKE 1 TABLET(5 MG) BY MOUTH THREE TIMES DAILY AS NEEDED FOR MUSCLE SPASMS 09/21/22  Yes Calle Schader, Eilleen Kempf, MD  FLUoxetine (PROZAC) 10 MG tablet Take 1 tablet (10 mg total) by mouth daily. 05/04/22  Yes Tlaloc Taddei, Eilleen Kempf, MD  fluticasone [facility]) 50 MCG/ACT nasal spray SHAKE LIQUID AND USE 2 SPRAYS IN EACH NOSTRIL DAILY 06/25/22  Yes Henson, Vickie L, NP-C  ibuprofen (ADVIL) 600 MG tablet TAKE 1 TABLET(600 MG) BY MOUTH EVERY 8 HOURS AS NEEDED FOR MODERATE PAIN 10/24/22  Yes Georgina Quint, MD  lisinopril (ZESTRIL) 10 MG tablet TAKE 1 TABLET(10 MG) BY MOUTH DAILY 11/15/21  Yes Kashmir Lysaght, Eilleen Kempf, MD  loratadine (CLARITIN) 10 MG tablet Take 1 tablet (10 mg total) by mouth daily. 09/12/18  Yes Georgina Quint, MD  metoprolol tartrate (LOPRESSOR) 100 MG tablet Take 2 hours prior to CT scan 07/28/22  Yes Crenshaw, Madolyn Frieze, MD  rosuvastatin (CRESTOR) 10 MG tablet TAKE 1 TABLET(10 MG) BY MOUTH DAILY 04/01/21  Yes Alban Marucci, Eilleen Kempf, MD    Allergies  Allergen Reactions   Macrobid [Nitrofurantoin] Nausea And Vomiting   Hydrocodone Nausea And Vomiting    dizzy   Mucinex Dm [Dm-Guaifenesin Er] Swelling    Sob, throat closing    Patient Active Problem List    Diagnosis Date Noted   Suspected sleep apnea 06/09/2022   Urinary incontinence without sensory awareness 03/05/2022   Chronic pain of left knee 02/17/2022   Stress incontinence in female 02/17/2022   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   Dyspareunia in female 09/13/2019   Skin tags, multiple acquired 07/27/2019   Chronic insomnia 05/31/2019   Essential hypertension 12/17/2017   Seasonal allergies 12/17/2017   Anxiety and depression 11/13/2016   Chronic anxiety 11/13/2016   Hot flashes    Situational anxiety    Dyslipidemia     Past Medical History:  Diagnosis Date   Allergy    seasonal   Anxiety    Depression    Family history of adverse reaction to anesthesia    sister hard to wake up   Hematuria    Hot flashes    Hx of cardiovascular stress test    Lex MV 12/13:  EF 68%, mild apical thinning, no ischemia   Hyperlipidemia    Hypertension    PONV (postoperative nausea and vomiting)     Past Surgical History:  Procedure Laterality Date   BREAST LUMPECTOMY     right breast, not cancer   Carpel tunnel     ECTOPIC PREGNANCY SURGERY      Social History   Socioeconomic History   Marital status: Divorced    Spouse name: Not on file   Number  of children: 1   Years of education: 8th grade   Highest education level: Not on file  Occupational History    Employer: UNEMPLOYED  Tobacco Use   Smoking status: Former    Types: Cigarettes   Smokeless tobacco: Never  Vaping Use   Vaping status: Never Used  Substance and Sexual Activity   Alcohol use: Yes    Comment: Occasional   Drug use: No   Sexual activity: Yes  Other Topics Concern   Not on file  Social History Narrative   Caffiene coffee 1 cup   Live alone.   Work" retired   Chemical engineer Strain: Low Risk  (06/03/2022)   Overall Financial Resource Strain (CARDIA)    Difficulty of Paying Living Expenses: Not hard at all  Food Insecurity: No Food Insecurity  (06/03/2022)   Hunger Vital Sign    Worried About Running Out of Food in the Last Year: Never true    Ran Out of Food in the Last Year: Never true  Transportation Needs: No Transportation Needs (06/03/2022)   PRAPARE - Administrator, Civil Service (Medical): No    Lack of Transportation (Non-Medical): No  Physical Activity: Inactive (06/03/2022)   Exercise Vital Sign    Days of Exercise per Week: 0 days    Minutes of Exercise per Session: 0 min  Stress: No Stress Concern Present (06/03/2022)   [employer] of Occupational Health - Occupational Stress Questionnaire    Feeling of Stress : Not at all  Social Connections: Socially Isolated (06/03/2022)   Social Connection and Isolation Panel [NHANES]    Frequency of Communication with Friends and Family: Once a week    Frequency of Social Gatherings with Friends and Family: Once a week    Attends Religious Services: Never    Database administrator or Organizations: No    Attends Banker Meetings: Never    Marital Status: Never married  Intimate Partner Violence: Not At Risk (06/03/2022)   Humiliation, Afraid, Rape, and Kick questionnaire    Fear of Current or Ex-Partner: No    Emotionally Abused: No    Physically Abused: No    Sexually Abused: No    Family History  Problem Relation Age of Onset   Heart disease Mother    Diabetes Mother    Esophageal cancer Father        14's   Diabetes Sister    Diabetes Sister    Colon cancer Neg Hx      Review of Systems  Constitutional: Negative.  Negative for chills and fever.  HENT: Negative.    Respiratory: Negative.  Negative for shortness of breath.   Cardiovascular: Negative.  Negative for chest pain and palpitations.  Gastrointestinal:  Negative for abdominal pain, nausea and vomiting.  Musculoskeletal:  Positive for joint pain (Left shoulder pain).  Skin: Negative.  Negative for rash.  Neurological: Negative.  Negative for dizziness and headaches.   All other systems reviewed and are negative.   Vitals:   11/02/22 1404  BP: 136/72  Pulse: 83  Temp: 98.2 F (36.8 C)  SpO2: 97%    Physical Exam Vitals reviewed.  Constitutional:      Appearance: Normal appearance.  HENT:     Head: Normocephalic.  Eyes:     Extraocular Movements: Extraocular movements intact.  Cardiovascular:     Rate and Rhythm: Normal rate.  Pulmonary:     Effort: Pulmonary effort is  normal.  Musculoskeletal:     Comments: Left shoulder: Limited range of motion due to pain  Skin:    General: Skin is warm and dry.  Neurological:     Mental Status: She is alert and oriented to person, place, and time.  Psychiatric:        Mood and Affect: Mood normal.        Behavior: Behavior normal.      ASSESSMENT & PLAN: A total of 32 minutes was spent with the patient and counseling/coordination of care regarding preparing for this visit, review of most recent office visit notes, differential diagnosis of left shoulder pain including possibility of bursitis, pain management, review of all medications, need for orthopedic evaluation, prognosis, documentation, and need for follow-up.  Problem List Items Addressed This Visit       Other   Acute pain of left shoulder - Primary    Differential diagnosis discussed.  Possible bursitis. Pain management discussed. Recommend orthopedic evaluation Referral to sports medicine placed today.      Relevant Medications   meloxicam (MOBIC) 15 MG tablet   Other Relevant Orders   Ambulatory referral to Sports Medicine   Patient Instructions  Shoulder Pain Many things can cause shoulder pain, including: An injury. Moving the shoulder in the same way again and again (overuse). Joint pain (arthritis). Pain can come from: Swelling and irritation (inflammation) of any part of the shoulder. An injury to: The shoulder joint. Tissues that connect muscle to bone (tendons). Tissues that connect bones to each other  (ligaments). Bones. Follow these instructions at home: Watch for changes in your symptoms. Let your doctor know about them. Follow these instructions to help with your pain. If you have a sling that can be taken off: Wear the sling as told by your doctor. Take it off only as told by your doctor. Check the skin around the sling every day. Tell your doctor if you see problems. Loosen the sling if your fingers: Tingle. Become numb. Become cold. Keep the sling clean. If the sling is not waterproof: Do not let it get wet. Take the sling off when you shower or bathe. Managing pain, stiffness, and swelling  If told, put ice on the painful area. Put ice in a plastic bag. Place a towel between your skin and the bag. Leave the ice on for 20 minutes, 2-3 times a day. Stop putting ice on if it does not help with the pain. If your skin turns bright red, take off the ice right away to prevent skin damage. The risk of damage is higher if you cannot feel pain, heat, or cold. Squeeze a soft ball or a foam pad as much as possible. This prevents swelling in the shoulder. It also helps to strengthen the arm. General instructions Take over-the-counter and prescription medicines only as told by your doctor. Keep all follow-up visits. This will help you avoid any type of permanent shoulder problems. Contact a doctor if: Your pain gets worse. Medicine does not help your pain. You have new pain in your arm, hand, or fingers. You loosen your sling and your arm, hand, or fingers: Tingle. Are numb. Are swollen. Get help right away if: Your arm, hand, or fingers turn white or blue. This information is not intended to replace advice given to you by your health care provider. Make sure you discuss any questions you have with your health care provider. Document Revised: 09/26/2021 Document Reviewed: 09/26/2021 Elsevier Patient Education  2024 [employer].  Edwina Barth, MD [facility] at  [facility]

## 2022-11-02 NOTE — Patient Instructions (Signed)
Shoulder Pain Many things can cause shoulder pain, including: An injury. Moving the shoulder in the same way again and again (overuse). Joint pain (arthritis). Pain can come from: Swelling and irritation (inflammation) of any part of the shoulder. An injury to: The shoulder joint. Tissues that connect muscle to bone (tendons). Tissues that connect bones to each other (ligaments). Bones. Follow these instructions at home: Watch for changes in your symptoms. Let your doctor know about them. Follow these instructions to help with your pain. If you have a sling that can be taken off: Wear the sling as told by your doctor. Take it off only as told by your doctor. Check the skin around the sling every day. Tell your doctor if you see problems. Loosen the sling if your fingers: Tingle. Become numb. Become cold. Keep the sling clean. If the sling is not waterproof: Do not let it get wet. Take the sling off when you shower or bathe. Managing pain, stiffness, and swelling  If told, put ice on the painful area. Put ice in a plastic bag. Place a towel between your skin and the bag. Leave the ice on for 20 minutes, 2-3 times a day. Stop putting ice on if it does not help with the pain. If your skin turns bright red, take off the ice right away to prevent skin damage. The risk of damage is higher if you cannot feel pain, heat, or cold. Squeeze a soft ball or a foam pad as much as possible. This prevents swelling in the shoulder. It also helps to strengthen the arm. General instructions Take over-the-counter and prescription medicines only as told by your doctor. Keep all follow-up visits. This will help you avoid any type of permanent shoulder problems. Contact a doctor if: Your pain gets worse. Medicine does not help your pain. You have new pain in your arm, hand, or fingers. You loosen your sling and your arm, hand, or fingers: Tingle. Are numb. Are swollen. Get help right away  if: Your arm, hand, or fingers turn white or blue. This information is not intended to replace advice given to you by your health care provider. Make sure you discuss any questions you have with your health care provider. Document Revised: 09/26/2021 Document Reviewed: 09/26/2021 Elsevier Patient Education  2024 Elsevier Inc.  

## 2022-11-02 NOTE — Assessment & Plan Note (Signed)
Differential diagnosis discussed.  Possible bursitis. Pain management discussed. Recommend orthopedic evaluation Referral to sports medicine placed today.

## 2022-11-04 ENCOUNTER — Ambulatory Visit (INDEPENDENT_AMBULATORY_CARE_PROVIDER_SITE_OTHER): Payer: 59 | Admitting: Family Medicine

## 2022-11-04 ENCOUNTER — Other Ambulatory Visit: Payer: Self-pay

## 2022-11-04 ENCOUNTER — Encounter: Payer: Self-pay | Admitting: Family Medicine

## 2022-11-04 VITALS — BP 152/82 | HR 86 | Ht 60.0 in | Wt 140.0 lb

## 2022-11-04 DIAGNOSIS — M25512 Pain in left shoulder: Secondary | ICD-10-CM

## 2022-11-04 DIAGNOSIS — G8929 Other chronic pain: Secondary | ICD-10-CM

## 2022-11-04 NOTE — Patient Instructions (Addendum)
Thank you for coming in today.   I've referred you to Physical Therapy.  Let us know if you don't hear from them in one week.   Take the Meloxicam as previously prescribed  Check back in 4-6 weeks

## 2022-11-04 NOTE — Progress Notes (Signed)
I, Stevenson Clinch, CMA acting as a scribe for Clementeen Graham, MD.   Katie Gardner is a 67 y.o. female who presents to [facility] at [facility] today for L shoulder pain ongoing for about a month. No MOI. Pt locates pain to anterior aspect of the shoulder radiating into the arm. Denies injury. Pt is LHD. Denies sharp shooting pains. Notes limited ROM, < 9 degrees with lateral raise. Denies n/t in the hands. Has developed left-sided neck pain over the past day. Had imaging done at [facility] 3 weeks ago.   Neck pain: x 1 day Radiates: no Aggravates: overhead reaching Treatments tried: meloxicam  Pertinent review of systems: No fevers or chills  Relevant historical information: Hypertension   Exam:  BP (!) 152/82   Pulse 86   Ht 5' (1.524 m)   Wt 140 lb (63.5 kg)   SpO2 98%   BMI 27.34 kg/m  General: Well Developed, well nourished, and in no acute distress.   MSK: Left shoulder: Normal-appearing Mildly tender palpation anterior shoulder. Range of motion: Active and passive range of motion intact external rotation and functional internal rotation.  Active abduction limited to about 70 degrees.  Passive range of motion abduction is limited to about 110 degrees.  Strength intact external/internal rotation.  Limited abduction with pain to about 4/5.    Lab and Radiology Results  Diagnostic Limited MSK Ultrasound of: Left shoulder Biceps tendon intact. Subscapularis tendon is intact with hypoechoic fluid collecting superficial to the subscapularis tendon consistent with a subdeltoid bursitis. Supraspinatus tendon is intact without visible tear with mild subacromial bursitis present. Infraspinatus tendon is intact. AC joint mild effusion. Impression: Subdeltoid bursitis and subacromial bursitis.   Patient had a left shoulder x-ray at an outside urgent care.  I do not have access to the x-ray or x-ray report she reports it was  "okay"   Assessment and Plan: 67 y.o. female with left shoulder pain thought to be due to impingement bursitis or perhaps frozen shoulder.  She is having a lot of difficulty with range of motion and pain.  I think a steroid injection would be very helpful procedure today.  She is not interested in a cortisone shot today and wants to try meloxicam prescribed by her PCP and physical therapy.  She does have some transportation difficulties so I am worried this plan may not be as effective as she would like it to be.  Plan to refer to physical therapy.  Check back in 4 to 6 weeks consider injection if not improving.   PDMP not reviewed this encounter. Orders Placed This Encounter  Procedures   [place] LIMITED JOINT SPACE STRUCTURES UP LEFT(NO LINKED CHARGES)    Order Specific Question:   Reason for Exam (SYMPTOM  OR DIAGNOSIS REQUIRED)    Answer:   left shoulder pain    Order Specific Question:   Preferred imaging location?    Answer:   [facility] referral to Physical Therapy    Referral Priority:   Routine    Referral Type:   Physical Medicine    Referral Reason:   Specialty Services Required    Requested Specialty:   Physical Therapy    Number of Visits Requested:   1   No orders of the defined types were placed in this encounter.    Discussed warning signs or symptoms. Please see discharge instructions. Patient expresses understanding.   The above documentation has  been reviewed and is accurate and complete Clementeen Graham, M.D.

## 2022-11-12 ENCOUNTER — Other Ambulatory Visit: Payer: Self-pay | Admitting: Emergency Medicine

## 2022-11-12 DIAGNOSIS — M25512 Pain in left shoulder: Secondary | ICD-10-CM

## 2022-11-19 ENCOUNTER — Other Ambulatory Visit: Payer: Self-pay | Admitting: Emergency Medicine

## 2022-11-19 DIAGNOSIS — M25512 Pain in left shoulder: Secondary | ICD-10-CM

## 2022-11-24 ENCOUNTER — Other Ambulatory Visit: Payer: Self-pay | Admitting: Emergency Medicine

## 2022-11-24 DIAGNOSIS — F418 Other specified anxiety disorders: Secondary | ICD-10-CM

## 2022-11-24 DIAGNOSIS — F32A Depression, unspecified: Secondary | ICD-10-CM

## 2022-11-25 ENCOUNTER — Ambulatory Visit: Payer: 59 | Attending: Family Medicine | Admitting: Physical Therapy

## 2022-11-25 ENCOUNTER — Encounter: Payer: Self-pay | Admitting: Physical Therapy

## 2022-11-25 DIAGNOSIS — M6281 Muscle weakness (generalized): Secondary | ICD-10-CM | POA: Insufficient documentation

## 2022-11-25 DIAGNOSIS — G8929 Other chronic pain: Secondary | ICD-10-CM | POA: Diagnosis not present

## 2022-11-25 DIAGNOSIS — R278 Other lack of coordination: Secondary | ICD-10-CM | POA: Insufficient documentation

## 2022-11-25 DIAGNOSIS — M25512 Pain in left shoulder: Secondary | ICD-10-CM | POA: Insufficient documentation

## 2022-11-25 DIAGNOSIS — M25612 Stiffness of left shoulder, not elsewhere classified: Secondary | ICD-10-CM | POA: Diagnosis not present

## 2022-11-25 NOTE — Therapy (Signed)
OUTPATIENT PHYSICAL THERAPY SHOULDER EVALUATION   Patient Name: Rechelle Schuler MRN: [id number] DOB:[birth year], 67 y.o., female Today's Date: 11/25/2022  END OF SESSION:  PT End of Session - 11/25/22 1808     Visit Number 1    Date for PT Re-Evaluation 02/03/23    PT Start Time 1807    PT Stop Time 1835    PT Time Calculation (min) 28 min    Activity Tolerance Patient tolerated treatment well    Behavior During Therapy WFL for tasks assessed/performed             Past Medical History:  Diagnosis Date   Allergy    seasonal   Anxiety    Depression    Family history of adverse reaction to anesthesia    sister hard to wake up   Hematuria    Hot flashes    Hx of cardiovascular stress test    Lex MV 12/13:  EF 68%, mild apical thinning, no ischemia   Hyperlipidemia    Hypertension    PONV (postoperative nausea and vomiting)    Past Surgical History:  Procedure Laterality Date   BREAST LUMPECTOMY     right breast, not cancer   Carpel tunnel     ECTOPIC PREGNANCY SURGERY     Patient Active Problem List   Diagnosis Date Noted   Acute pain of left shoulder 11/02/2022   Suspected sleep apnea 06/09/2022   Urinary incontinence without sensory awareness 03/05/2022   Chronic pain of left knee 02/17/2022   Stress incontinence in female 02/17/2022   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   Dyspareunia in female 09/13/2019   Skin tags, multiple acquired 07/27/2019   Chronic insomnia 05/31/2019   Essential hypertension 12/17/2017   Seasonal allergies 12/17/2017   Anxiety and depression 11/13/2016   Chronic anxiety 11/13/2016   Hot flashes    Situational anxiety    Dyslipidemia     PCP: Georgina Quint, MD   REFERRING PROVIDER: Rodolph Bong, MD   REFERRING DIAG: [phone] (ICD-10-CM) - Left shoulder pain, unspecified chronicity   THERAPY DIAG:  Acute pain of left shoulder  Stiffness of left shoulder, not elsewhere classified  Muscle weakness  (generalized)  Other lack of coordination  Rationale for Evaluation and Treatment: Rehabilitation  ONSET DATE: 11/04/22  SUBJECTIVE:                                                                                                                                                                                      SUBJECTIVE STATEMENT: Patient reports about 1 month H/O increasing L shoulder pain and stiffness. No known cause of the changes. Hand dominance: Left  PERTINENT HISTORY: Per referring physician note: Ladonne Lietzke is a 67 y.o. female who presents to [facility] at [facility] today for L shoulder pain ongoing for about a month. No MOI. Pt locates pain to anterior aspect of the shoulder radiating into the arm. Denies injury. Pt is LHD. Denies sharp shooting pains. Notes limited ROM, < 9 degrees with lateral raise. Denies n/t in the hands. Has developed left-sided neck pain over the past day.   PAIN:  Are you having pain? Yes: NPRS scale: 3/10 Pain location: L shoulder Pain description: sharp Aggravating factors: raising the arm Relieving factors: She is taking magnesium and Ibuprophen  PRECAUTIONS: Shoulder  RED FLAGS: None   WEIGHT BEARING RESTRICTIONS: No  FALLS:  Has patient fallen in last 6 months? No  LIVING ENVIRONMENT: Lives with: lives alone Lives in: House/apartment Stairs: no issues Has following equipment at home: None  OCCUPATION: On disability, anxiety and depression  PLOF: Independent, has to help the shoulder in elevation  PATIENT GOALS:Patient wants to get relief from the pain, return to her normal daily routines, work out, without pain.  NEXT MD VISIT:   OBJECTIVE:   DIAGNOSTIC FINDINGS:  Diagnostic Limited MSK Ultrasound of: Left shoulder Biceps tendon intact. Subscapularis tendon is intact with hypoechoic fluid collecting superficial to the subscapularis tendon consistent with a subdeltoid bursitis. Supraspinatus tendon is  intact without visible tear with mild subacromial bursitis present. Infraspinatus tendon is intact. AC joint mild effusion. Impression: Subdeltoid bursitis and subacromial bursitis.  COGNITION: Overall cognitive status: Within functional limits for tasks assessed     SENSATION: WFL  POSTURE: Mildly rounded shoulders, decreased thoracic and lumbar curves.  UPPER EXTREMITY ROM: RUE WNL, LUE WNL except where noted.  Active ROM Right eval Left eval  Shoulder flexion  55  Shoulder extension    Shoulder abduction  50  Shoulder adduction    Shoulder internal rotation  WNL  Shoulder external rotation  WNL  Elbow flexion    Elbow extension    Wrist flexion    Wrist extension    Wrist ulnar deviation    Wrist radial deviation    Wrist pronation    Wrist supination    (Blank rows = not tested)  UPPER EXTREMITY MMT:RUE WNL, L shoulder painful with any resistance.   SHOULDER SPECIAL TESTS: Impingement tests: Hawkins/Kennedy impingement test: positive  and Painful arc test: positive  Rotator cuff assessment: Drop arm test: positive , Empty can test: positive , and Full can test: positive  Biceps assessment: Speed's test: positive   JOINT MOBILITY TESTING:  Scapular mobility limited in all planes on L, GH inf and post glide moderately decreased, tight post capsule  PALPATION:  Very tight in B cerv paraspinals and UT. Very TTP over L UT, infraspinatus, supraspinatus tendon, very tight and TTP L pects.   TODAY'S TREATMENT:  DATE:  10/25/22 Education Seated press ups and scapular retraction  PATIENT EDUCATION: Education details: POC Person educated: Patient Education method: Explanation Education comprehension: verbalized understanding  HOME EXERCISE PROGRAM: Verbal-Seated press ups and scap retraction  ASSESSMENT:  CLINICAL  IMPRESSION: Patient is a 67 y.o. who was seen today for physical therapy evaluation and treatment for L shoulder acromial and subdeltoid bursitis. Dr recommended injection, but patient declined. Her signs and symptoms support the diagnosis.She has severe tightness, decreased L shoulder and scap ROM, weakness and pain in most of the upper quadrant. She is positive in [place], empty can, full can, drop arm, and Speed's test on L. She will benefit from PT to address her acute pain as well as facilitate improved motion and mechanics in her L shoulder to prevent recurrence of the impingement.  OBJECTIVE IMPAIRMENTS: decreased activity tolerance, decreased coordination, decreased ROM, decreased strength, impaired perceived functional ability, impaired flexibility, impaired UE functional use, and pain.   ACTIVITY LIMITATIONS: carrying, lifting, bathing, dressing, reach over head, and hygiene/grooming  PARTICIPATION LIMITATIONS: meal prep, cleaning, laundry, driving, shopping, and community activity  PERSONAL FACTORS: Past/current experiences are also affecting patient's functional outcome.   REHAB POTENTIAL: Good  CLINICAL DECISION MAKING: Stable/uncomplicated  EVALUATION COMPLEXITY: Low   GOALS: Goals reviewed with patient? Yes  SHORT TERM GOALS: Target date: 12/11/22  I with initial HEP Baseline: Goal status: INITIAL  LONG TERM GOALS: Target date: 02/03/23  I with final HEP Baseline:  Goal status: INITIAL  2.  Patient will recover L shoulder ROM equal to R shoulder Baseline: See assessment Goal status: INITIAL  3.  Patient will improve R shoulder strength to at least 4/5 Baseline: Unable to take any resistance due to pain Goal status: INITIAL  4.  Patient will report pain < 3/10 while performing normal daily activities. Baseline: Uses RUE to lift LUE up to reach her head Goal status: INITIAL  5.  Patient will report that her pain no longer impedes sleep Baseline:  Goal  status: INITIAL  PLAN:  PT FREQUENCY: 1-2x/week  PT DURATION: 10 weeks  PLANNED INTERVENTIONS: Therapeutic exercises, Therapeutic activity, Neuromuscular re-education, Balance training, Gait training, Patient/Family education, Self Care, Joint mobilization, Dry Needling, Electrical stimulation, Spinal mobilization, Cryotherapy, Moist heat, Taping, Vasopneumatic device, Ionotophoresis 4mg /ml Dexamethasone, and Manual therapy  PLAN FOR NEXT SESSION: Initiate HEP for shoulder inf glide, stretch, scap mobility and stability.   Iona Beard, DPT 11/25/2022, 6:52 PM

## 2022-12-02 ENCOUNTER — Encounter: Payer: Self-pay | Admitting: Physical Therapy

## 2022-12-02 ENCOUNTER — Ambulatory Visit: Payer: 59 | Admitting: Physical Therapy

## 2022-12-02 DIAGNOSIS — G8929 Other chronic pain: Secondary | ICD-10-CM | POA: Diagnosis not present

## 2022-12-02 DIAGNOSIS — M25512 Pain in left shoulder: Secondary | ICD-10-CM

## 2022-12-02 DIAGNOSIS — M25612 Stiffness of left shoulder, not elsewhere classified: Secondary | ICD-10-CM

## 2022-12-02 DIAGNOSIS — R278 Other lack of coordination: Secondary | ICD-10-CM | POA: Diagnosis not present

## 2022-12-02 DIAGNOSIS — M6281 Muscle weakness (generalized): Secondary | ICD-10-CM

## 2022-12-02 NOTE — Therapy (Signed)
OUTPATIENT PHYSICAL THERAPY SHOULDER EVALUATION   Patient Name: Katie Gardner MRN: [id number] DOB:Oct 21, 1955, 67 y.o., female Today's Date: 12/02/2022  END OF SESSION:  PT End of Session - 12/02/22 1735     Visit Number 2    Date for PT Re-Evaluation 02/03/23    Authorization Type [employer] medicare    PT Start Time 1734    PT Stop Time 1820    PT Time Calculation (min) 46 min    Activity Tolerance Patient tolerated treatment well    Behavior During Therapy WFL for tasks assessed/performed             Past Medical History:  Diagnosis Date   Allergy    seasonal   Anxiety    Depression    Family history of adverse reaction to anesthesia    sister hard to wake up   Hematuria    Hot flashes    Hx of cardiovascular stress test    Lex MV 12/13:  EF 68%, mild apical thinning, no ischemia   Hyperlipidemia    Hypertension    PONV (postoperative nausea and vomiting)    Past Surgical History:  Procedure Laterality Date   BREAST LUMPECTOMY     right breast, not cancer   Carpel tunnel     ECTOPIC PREGNANCY SURGERY     Patient Active Problem List   Diagnosis Date Noted   Acute pain of left shoulder 11/02/2022   Suspected sleep apnea 06/09/2022   Urinary incontinence without sensory awareness 03/05/2022   Chronic pain of left knee 02/17/2022   Stress incontinence in female 02/17/2022   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   Dyspareunia in female 09/13/2019   Skin tags, multiple acquired 07/27/2019   Chronic insomnia 05/31/2019   Essential hypertension 12/17/2017   Seasonal allergies 12/17/2017   Anxiety and depression 11/13/2016   Chronic anxiety 11/13/2016   Hot flashes    Situational anxiety    Dyslipidemia     PCP: Georgina Quint, MD   REFERRING PROVIDER: Rodolph Bong, MD   REFERRING DIAG: [phone] (ICD-10-CM) - Left shoulder pain, unspecified chronicity   THERAPY DIAG:  Acute pain of left shoulder  Stiffness of left shoulder, not elsewhere  classified  Muscle weakness (generalized)  Rationale for Evaluation and Treatment: Rehabilitation  ONSET DATE: 11/04/22  SUBJECTIVE:                                                                                                                                                                                      SUBJECTIVE STATEMENT: Patient reports about 1 month H/O increasing L shoulder pain and stiffness. No known cause of the changes.  Sore,  just took the medicine, feel a little woozy Hand dominance: Left  PERTINENT HISTORY: Per referring physician note: Katie Gardner is a 67 y.o. female who presents to [facility] at [facility] today for L shoulder pain ongoing for about a month. No MOI. Pt locates pain to anterior aspect of the shoulder radiating into the arm. Denies injury. Pt is LHD. Denies sharp shooting pains. Notes limited ROM, < 9 degrees with lateral raise. Denies n/t in the hands. Has developed left-sided neck pain over the past day.   PAIN:  Are you having pain? Yes: NPRS scale: 3/10 Pain location: L shoulder Pain description: sharp Aggravating factors: raising the arm Relieving factors: She is taking magnesium and Ibuprophen  PRECAUTIONS: Shoulder  RED FLAGS: None   WEIGHT BEARING RESTRICTIONS: No  FALLS:  Has patient fallen in last 6 months? No  LIVING ENVIRONMENT: Lives with: lives alone Lives in: House/apartment Stairs: no issues Has following equipment at home: None  OCCUPATION: On disability, anxiety and depression  PLOF: Independent, has to help the shoulder in elevation  PATIENT GOALS:Patient wants to get relief from the pain, return to her normal daily routines, work out, without pain.  NEXT MD VISIT:   OBJECTIVE:   DIAGNOSTIC FINDINGS:  Diagnostic Limited MSK Ultrasound of: Left shoulder Biceps tendon intact. Subscapularis tendon is intact with hypoechoic fluid collecting superficial to the subscapularis tendon consistent  with a subdeltoid bursitis. Supraspinatus tendon is intact without visible tear with mild subacromial bursitis present. Infraspinatus tendon is intact. AC joint mild effusion. Impression: Subdeltoid bursitis and subacromial bursitis.  COGNITION: Overall cognitive status: Within functional limits for tasks assessed     SENSATION: WFL  POSTURE: Mildly rounded shoulders, decreased thoracic and lumbar curves.  UPPER EXTREMITY ROM: RUE WNL, LUE WNL except where noted.  Active ROM Right eval Left eval  Shoulder flexion  55  Shoulder extension    Shoulder abduction  50  Shoulder adduction    Shoulder internal rotation  WNL  Shoulder external rotation  WNL  Elbow flexion    Elbow extension    Wrist flexion    Wrist extension    Wrist ulnar deviation    Wrist radial deviation    Wrist pronation    Wrist supination    (Blank rows = not tested)  UPPER EXTREMITY MMT:RUE WNL, L shoulder painful with any resistance.   SHOULDER SPECIAL TESTS: Impingement tests: Hawkins/Kennedy impingement test: positive  and Painful arc test: positive  Rotator cuff assessment: Drop arm test: positive , Empty can test: positive , and Full can test: positive  Biceps assessment: Speed's test: positive   JOINT MOBILITY TESTING:  Scapular mobility limited in all planes on L, GH inf and post glide moderately decreased, tight post capsule  PALPATION:  Very tight in B cerv paraspinals and UT. Very TTP over L UT, infraspinatus, supraspinatus tendon, very tight and TTP L pects.   TODAY'S TREATMENT:  DATE:  12/02/22 UBE level 1 x 4 minutes Red tband Row 2x10 Red tband extension 2x10 Red tband ER 2x10 Horizontal abduction below shoulder level this did cause pain AAROM wall slides and circles STM to the left upper trap and into the neck area MHP/IFC to the left upper  trap  11/25/22 Education Seated press ups and scapular retraction  PATIENT EDUCATION: Education details: POC Person educated: Patient Education method: Explanation Education comprehension: verbalized understanding  HOME EXERCISE PROGRAM: Verbal-Seated press ups and scap retraction  ASSESSMENT:  CLINICAL IMPRESSION: Patient is a 67 y.o. who was seen today for physical therapy evaluation and treatment for L shoulder acromial and subdeltoid bursitis. Dr recommended injection, but patient declined. Her signs and symptoms support the diagnosis.  I initiated exercises today and she did very well, she reports that she is doing the HEP, she did ned a lot of cues for posture and form on the exercises.  She has a very tight and very large knot in the left upper trap, we talked about DN but she declined this, she was very sore with the STM, so we finished with modalities to help with the pain and soreness OBJECTIVE IMPAIRMENTS: decreased activity tolerance, decreased coordination, decreased ROM, decreased strength, impaired perceived functional ability, impaired flexibility, impaired UE functional use, and pain.   ACTIVITY LIMITATIONS: carrying, lifting, bathing, dressing, reach over head, and hygiene/grooming  PARTICIPATION LIMITATIONS: meal prep, cleaning, laundry, driving, shopping, and community activity  PERSONAL FACTORS: Past/current experiences are also affecting patient's functional outcome.   REHAB POTENTIAL: Good  CLINICAL DECISION MAKING: Stable/uncomplicated  EVALUATION COMPLEXITY: Low   GOALS: Goals reviewed with patient? Yes  SHORT TERM GOALS: Target date: 12/11/22  I with initial HEP Baseline: Goal status: met 12/02/22  LONG TERM GOALS: Target date: 02/03/23  I with final HEP Baseline:  Goal status: INITIAL  2.  Patient will recover L shoulder ROM equal to R shoulder Baseline: See assessment Goal status: INITIAL  3.  Patient will improve R shoulder strength to at  least 4/5 Baseline: Unable to take any resistance due to pain Goal status: INITIAL  4.  Patient will report pain < 3/10 while performing normal daily activities. Baseline: Uses RUE to lift LUE up to reach her head Goal status: INITIAL  5.  Patient will report that her pain no longer impedes sleep Baseline:  Goal status: INITIAL  PLAN:  PT FREQUENCY: 1-2x/week  PT DURATION: 10 weeks  PLANNED INTERVENTIONS: Therapeutic exercises, Therapeutic activity, Neuromuscular re-education, Balance training, Gait training, Patient/Family education, Self Care, Joint mobilization, Dry Needling, Electrical stimulation, Spinal mobilization, Cryotherapy, Moist heat, Taping, Vasopneumatic device, Ionotophoresis 4mg /ml Dexamethasone, and Manual therapy  PLAN FOR NEXT SESSION: Work on function , see if we can decrease the pain and the knot, seems like due to pain she has elevated the shoulder and caused the spasms   Stacie Glaze, PT 12/02/2022, 5:38 PM

## 2022-12-04 ENCOUNTER — Other Ambulatory Visit: Payer: Self-pay | Admitting: Emergency Medicine

## 2022-12-04 DIAGNOSIS — M25512 Pain in left shoulder: Secondary | ICD-10-CM

## 2022-12-08 ENCOUNTER — Ambulatory Visit: Payer: 59 | Attending: Family Medicine | Admitting: Physical Therapy

## 2022-12-08 ENCOUNTER — Encounter: Payer: Self-pay | Admitting: Physical Therapy

## 2022-12-08 DIAGNOSIS — M6281 Muscle weakness (generalized): Secondary | ICD-10-CM | POA: Diagnosis not present

## 2022-12-08 DIAGNOSIS — M25512 Pain in left shoulder: Secondary | ICD-10-CM

## 2022-12-08 DIAGNOSIS — M25612 Stiffness of left shoulder, not elsewhere classified: Secondary | ICD-10-CM

## 2022-12-08 DIAGNOSIS — R278 Other lack of coordination: Secondary | ICD-10-CM | POA: Diagnosis not present

## 2022-12-08 NOTE — Therapy (Signed)
OUTPATIENT PHYSICAL THERAPY SHOULDER EVALUATION   Patient Name: Katie Gardner MRN: [id number] DOB:04-07-1955, 67 y.o., female Today's Date: 12/08/2022  END OF SESSION:  PT End of Session - 12/08/22 1429     Visit Number 3    Date for PT Re-Evaluation 02/03/23    PT Start Time 1430    PT Stop Time 1515    PT Time Calculation (min) 45 min    Activity Tolerance Patient tolerated treatment well    Behavior During Therapy WFL for tasks assessed/performed             Past Medical History:  Diagnosis Date   Allergy    seasonal   Anxiety    Depression    Family history of adverse reaction to anesthesia    sister hard to wake up   Hematuria    Hot flashes    Hx of cardiovascular stress test    Lex MV 12/13:  EF 68%, mild apical thinning, no ischemia   Hyperlipidemia    Hypertension    PONV (postoperative nausea and vomiting)    Past Surgical History:  Procedure Laterality Date   BREAST LUMPECTOMY     right breast, not cancer   Carpel tunnel     ECTOPIC PREGNANCY SURGERY     Patient Active Problem List   Diagnosis Date Noted   Acute pain of left shoulder 11/02/2022   Suspected sleep apnea 06/09/2022   Urinary incontinence without sensory awareness 03/05/2022   Chronic pain of left knee 02/17/2022   Stress incontinence in female 02/17/2022   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   Dyspareunia in female 09/13/2019   Skin tags, multiple acquired 07/27/2019   Chronic insomnia 05/31/2019   Essential hypertension 12/17/2017   Seasonal allergies 12/17/2017   Anxiety and depression 11/13/2016   Chronic anxiety 11/13/2016   Hot flashes    Situational anxiety    Dyslipidemia     PCP: Katie Quint, MD   REFERRING PROVIDER: Rodolph Bong, MD   REFERRING DIAG: [phone] (ICD-10-CM) - Left shoulder pain, unspecified chronicity   THERAPY DIAG:  Acute pain of left shoulder  Stiffness of left shoulder, not elsewhere classified  Muscle weakness  (generalized)  Other lack of coordination  Rationale for Evaluation and Treatment: Rehabilitation  ONSET DATE: 11/04/22  SUBJECTIVE:                                                                                                                                                                                      SUBJECTIVE STATEMENT: Im good, had some pain after the next day of therapy Hand dominance: Left  PERTINENT HISTORY: Per referring physician note: Katie [place]  Gardner is a 67 y.o. female who presents to [facility] at [facility] today for L shoulder pain ongoing for about a month. No MOI. Pt locates pain to anterior aspect of the shoulder radiating into the arm. Denies injury. Pt is LHD. Denies sharp shooting pains. Notes limited ROM, < 9 degrees with lateral raise. Denies n/t in the hands. Has developed left-sided neck pain over the past day.   PAIN:  Are you having pain? Yes: NPRS scale: 3/10 Pain location: L shoulder Pain description: sharp Aggravating factors: raising the arm Relieving factors: She is taking magnesium and Ibuprophen  PRECAUTIONS: Shoulder  RED FLAGS: None   WEIGHT BEARING RESTRICTIONS: No  FALLS:  Has patient fallen in last 6 months? No  LIVING ENVIRONMENT: Lives with: lives alone Lives in: House/apartment Stairs: no issues Has following equipment at home: None  OCCUPATION: On disability, anxiety and depression  PLOF: Independent, has to help the shoulder in elevation  PATIENT GOALS:Patient wants to get relief from the pain, return to her normal daily routines, work out, without pain.  NEXT MD VISIT:   OBJECTIVE:   DIAGNOSTIC FINDINGS:  Diagnostic Limited MSK Ultrasound of: Left shoulder Biceps tendon intact. Subscapularis tendon is intact with hypoechoic fluid collecting superficial to the subscapularis tendon consistent with a subdeltoid bursitis. Supraspinatus tendon is intact without visible tear with mild subacromial  bursitis present. Infraspinatus tendon is intact. AC joint mild effusion. Impression: Subdeltoid bursitis and subacromial bursitis.  COGNITION: Overall cognitive status: Within functional limits for tasks assessed     SENSATION: WFL  POSTURE: Mildly rounded shoulders, decreased thoracic and lumbar curves.  UPPER EXTREMITY ROM: RUE WNL, LUE WNL except where noted.  Active ROM Right eval Left eval  Shoulder flexion  55  Shoulder extension    Shoulder abduction  50  Shoulder adduction    Shoulder internal rotation  WNL  Shoulder external rotation  WNL  Elbow flexion    Elbow extension    Wrist flexion    Wrist extension    Wrist ulnar deviation    Wrist radial deviation    Wrist pronation    Wrist supination    (Blank rows = not tested)  UPPER EXTREMITY MMT:RUE WNL, L shoulder painful with any resistance.   SHOULDER SPECIAL TESTS: Impingement tests: Hawkins/Kennedy impingement test: positive  and Painful arc test: positive  Rotator cuff assessment: Drop arm test: positive , Empty can test: positive , and Full can test: positive  Biceps assessment: Speed's test: positive   JOINT MOBILITY TESTING:  Scapular mobility limited in all planes on L, GH inf and post glide moderately decreased, tight post capsule  PALPATION:  Very tight in B cerv paraspinals and UT. Very TTP over L UT, infraspinatus, supraspinatus tendon, very tight and TTP L pects.   TODAY'S TREATMENT:  DATE:  12/08/22 NuStep L5 x 6 min  AAORM Dowel Fle, Ext, IR 2x5 LUE ER/IR yellow 2x10 yellow tband Row 2x10 Shoulder Ext yellow 2x10 Shoulder flex & abd x5 each LUE PROM in all directions with end range holds   12/02/22 UBE level 1 x 4 minutes Red tband Row 2x10 Red tband extension 2x10 Red tband ER 2x10 Horizontal abduction below shoulder level this did cause  pain AAROM wall slides and circles STM to the left upper trap and into the neck area MHP/IFC to the left upper trap  11/25/22 Education Seated press ups and scapular retraction  PATIENT EDUCATION: Education details: POC Person educated: Patient Education method: Explanation Education comprehension: verbalized understanding  HOME EXERCISE PROGRAM: Verbal-Seated press ups and scap retraction  ASSESSMENT:  CLINICAL IMPRESSION: Patient is a 67 y.o. who was seen today for physical therapy treatment for L shoulder acromial and subdeltoid bursitis. Dr recommended injection, but patient declined. Her signs and symptoms support the diagnosis.  I  continues with exercises today and she did very well. Weaker Tband chosen today due to soreness after last session. Pt able to achieve full PROM.  OBJECTIVE IMPAIRMENTS: decreased activity tolerance, decreased coordination, decreased ROM, decreased strength, impaired perceived functional ability, impaired flexibility, impaired UE functional use, and pain.   ACTIVITY LIMITATIONS: carrying, lifting, bathing, dressing, reach over head, and hygiene/grooming  PARTICIPATION LIMITATIONS: meal prep, cleaning, laundry, driving, shopping, and community activity  PERSONAL FACTORS: Past/current experiences are also affecting patient's functional outcome.   REHAB POTENTIAL: Good  CLINICAL DECISION MAKING: Stable/uncomplicated  EVALUATION COMPLEXITY: Low   GOALS: Goals reviewed with patient? Yes  SHORT TERM GOALS: Target date: 12/11/22  I with initial HEP Baseline: Goal status: met 12/02/22  LONG TERM GOALS: Target date: 02/03/23  I with final HEP Baseline:  Goal status: INITIAL  2.  Patient will recover L shoulder ROM equal to R shoulder Baseline: See assessment Goal status: INITIAL  3.  Patient will improve R shoulder strength to at least 4/5 Baseline: Unable to take any resistance due to pain Goal status: INITIAL  4.  Patient will  report pain < 3/10 while performing normal daily activities. Baseline: Uses RUE to lift LUE up to reach her head Goal status: INITIAL  5.  Patient will report that her pain no longer impedes sleep Baseline:  Goal status: INITIAL  PLAN:  PT FREQUENCY: 1-2x/week  PT DURATION: 10 weeks  PLANNED INTERVENTIONS: Therapeutic exercises, Therapeutic activity, Neuromuscular re-education, Balance training, Gait training, Patient/Family education, Self Care, Joint mobilization, Dry Needling, Electrical stimulation, Spinal mobilization, Cryotherapy, Moist heat, Taping, Vasopneumatic device, Ionotophoresis 4mg /ml Dexamethasone, and Manual therapy  PLAN FOR NEXT SESSION: Work on function , see if we can decrease the pain and the knot, seems like due to pain she has elevated the shoulder and caused the spasms   Debroah Baller, PTA 12/08/2022, 2:30 PM

## 2022-12-09 ENCOUNTER — Encounter: Payer: Self-pay | Admitting: Family Medicine

## 2022-12-09 ENCOUNTER — Ambulatory Visit: Payer: 59 | Admitting: Family Medicine

## 2022-12-09 ENCOUNTER — Ambulatory Visit (INDEPENDENT_AMBULATORY_CARE_PROVIDER_SITE_OTHER): Payer: 59 | Admitting: Emergency Medicine

## 2022-12-09 ENCOUNTER — Encounter: Payer: Self-pay | Admitting: Emergency Medicine

## 2022-12-09 VITALS — BP 120/78 | HR 77 | Ht 60.0 in | Wt 136.0 lb

## 2022-12-09 VITALS — BP 122/72 | HR 75 | Temp 98.0°F | Ht 60.0 in | Wt 136.0 lb

## 2022-12-09 DIAGNOSIS — I10 Essential (primary) hypertension: Secondary | ICD-10-CM

## 2022-12-09 DIAGNOSIS — G8929 Other chronic pain: Secondary | ICD-10-CM

## 2022-12-09 DIAGNOSIS — Z1211 Encounter for screening for malignant neoplasm of colon: Secondary | ICD-10-CM | POA: Diagnosis not present

## 2022-12-09 DIAGNOSIS — E785 Hyperlipidemia, unspecified: Secondary | ICD-10-CM

## 2022-12-09 DIAGNOSIS — M25562 Pain in left knee: Secondary | ICD-10-CM

## 2022-12-09 DIAGNOSIS — M25512 Pain in left shoulder: Secondary | ICD-10-CM

## 2022-12-09 DIAGNOSIS — F419 Anxiety disorder, unspecified: Secondary | ICD-10-CM

## 2022-12-09 DIAGNOSIS — F32A Depression, unspecified: Secondary | ICD-10-CM | POA: Diagnosis not present

## 2022-12-09 LAB — CBC WITH DIFFERENTIAL/PLATELET
Basophils Absolute: 0 10*3/uL (ref 0.0–0.1)
Basophils Relative: 0.4 % (ref 0.0–3.0)
Eosinophils Absolute: 0.1 10*3/uL (ref 0.0–0.7)
Eosinophils Relative: 1.7 % (ref 0.0–5.0)
HCT: 42.7 % (ref 36.0–46.0)
Hemoglobin: 14 g/dL (ref 12.0–15.0)
Lymphocytes Relative: 29.4 % (ref 12.0–46.0)
Lymphs Abs: 2.1 10*3/uL (ref 0.7–4.0)
MCHC: 32.7 g/dL (ref 30.0–36.0)
MCV: 88.7 fL (ref 78.0–100.0)
Monocytes Absolute: 0.6 10*3/uL (ref 0.1–1.0)
Monocytes Relative: 7.7 % (ref 3.0–12.0)
Neutro Abs: 4.4 10*3/uL (ref 1.4–7.7)
Neutrophils Relative %: 60.8 % (ref 43.0–77.0)
Platelets: 224 10*3/uL (ref 150.0–400.0)
RBC: 4.82 Mil/uL (ref 3.87–5.11)
RDW: 13.3 % (ref 11.5–15.5)
WBC: 7.3 10*3/uL (ref 4.0–10.5)

## 2022-12-09 LAB — COMPREHENSIVE METABOLIC PANEL
ALT: 19 U/L (ref 0–35)
AST: 18 U/L (ref 0–37)
Albumin: 4.2 g/dL (ref 3.5–5.2)
Alkaline Phosphatase: 68 U/L (ref 39–117)
BUN: 20 mg/dL (ref 6–23)
CO2: 28 meq/L (ref 19–32)
Calcium: 9.5 mg/dL (ref 8.4–10.5)
Chloride: 103 meq/L (ref 96–112)
Creatinine, Ser: 0.83 mg/dL (ref 0.40–1.20)
GFR: 73.15 mL/min (ref >60.00)
Glucose, Bld: 94 mg/dL (ref 70–99)
Potassium: 4.3 meq/L (ref 3.5–5.1)
Sodium: 138 meq/L (ref 135–145)
Total Bilirubin: 0.3 mg/dL (ref 0.2–1.2)
Total Protein: 7.1 g/dL (ref 6.0–8.3)

## 2022-12-09 LAB — HEMOGLOBIN A1C: Hgb A1c MFr Bld: 5.7 % (ref 4.6–6.5)

## 2022-12-09 NOTE — Assessment & Plan Note (Signed)
Slowly getting better. See sports medicine on a regular basis Seen and evaluated today

## 2022-12-09 NOTE — Assessment & Plan Note (Signed)
Chronic stable condition Continue rosuvastatin 10 mg daily Diet and nutrition discussed Lipid profile done today The 10-year ASCVD risk score (Arnett DK, et al., 2019) is: 7.7%   Values used to calculate the score:     Age: 67 years     Sex: Female     Is Non-Hispanic African American: No     Diabetic: No     Tobacco smoker: No     Systolic Blood Pressure: 122 mmHg     Is BP treated: Yes     HDL Cholesterol: 65.8 mg/dL     Total Cholesterol: 189 mg/dL

## 2022-12-09 NOTE — Assessment & Plan Note (Signed)
BP Readings from Last 3 Encounters:  12/09/22 122/72  12/09/22 120/78  11/04/22 (!) 152/82  Well-controlled hypertension Continue lisinopril 10 mg daily Cardiovascular risks associated with hypertension discussed Dietary approaches to stop hypertension discussed

## 2022-12-09 NOTE — Patient Instructions (Signed)
Thank you for coming in today.   Recheck in about 1 month.   If not better probably the next step should be injection.  Ok to take xanax before the injection.   Dry Needling with PT could help.   Continue PT.

## 2022-12-09 NOTE — Assessment & Plan Note (Signed)
Much improved. Follows up with sports medicine Was seen earlier today

## 2022-12-09 NOTE — Patient Instructions (Signed)
Health Maintenance After Age 67 After age 67, you are at a higher risk for certain long-term diseases and infections as well as injuries from falls. Falls are a major cause of broken bones and head injuries in people who are older than age 67. Getting regular preventive care can help to keep you healthy and well. Preventive care includes getting regular testing and making lifestyle changes as recommended by your health care provider. Talk with your health care provider about: Which screenings and tests you should have. A screening is a test that checks for a disease when you have no symptoms. A diet and exercise plan that is right for you. What should I know about screenings and tests to prevent falls? Screening and testing are the best ways to find a health problem early. Early diagnosis and treatment give you the best chance of managing medical conditions that are common after age 67. Certain conditions and lifestyle choices may make you more likely to have a fall. Your health care provider may recommend: Regular vision checks. Poor vision and conditions such as cataracts can make you more likely to have a fall. If you wear glasses, make sure to get your prescription updated if your vision changes. Medicine review. Work with your health care provider to regularly review all of the medicines you are taking, including over-the-counter medicines. Ask your health care provider about any side effects that may make you more likely to have a fall. Tell your health care provider if any medicines that you take make you feel dizzy or sleepy. Strength and balance checks. Your health care provider may recommend certain tests to check your strength and balance while standing, walking, or changing positions. Foot health exam. Foot pain and numbness, as well as not wearing proper footwear, can make you more likely to have a fall. Screenings, including: Osteoporosis screening. Osteoporosis is a condition that causes  the bones to get weaker and break more easily. Blood pressure screening. Blood pressure changes and medicines to control blood pressure can make you feel dizzy. Depression screening. You may be more likely to have a fall if you have a fear of falling, feel depressed, or feel unable to do activities that you used to do. Alcohol use screening. Using too much alcohol can affect your balance and may make you more likely to have a fall. Follow these instructions at home: Lifestyle Do not drink alcohol if: Your health care provider tells you not to drink. If you drink alcohol: Limit how much you have to: 0-1 drink a day for women. 0-2 drinks a day for men. Know how much alcohol is in your drink. In the [place] one drink equals one 12 oz bottle of beer (355 mL), one 5 oz glass of wine (148 mL), or one 1 oz glass of hard liquor (44 mL). Do not use any products that contain nicotine or tobacco. These products include cigarettes, chewing tobacco, and vaping devices, such as e-cigarettes. If you need help quitting, ask your health care provider. Activity  Follow a regular exercise program to stay fit. This will help you maintain your balance. Ask your health care provider what types of exercise are appropriate for you. If you need a cane or walker, use it as recommended by your health care provider. Wear supportive shoes that have nonskid soles. Safety  Remove any tripping hazards, such as rugs, cords, and clutter. Install safety equipment such as grab bars in bathrooms and safety rails on stairs. Keep rooms and walkways   well-lit. General instructions Talk with your health care provider about your risks for falling. Tell your health care provider if: You fall. Be sure to tell your health care provider about all falls, even ones that seem minor. You feel dizzy, tiredness (fatigue), or off-balance. Take over-the-counter and prescription medicines only as told by your health care provider. These include  supplements. Eat a healthy diet and maintain a healthy weight. A healthy diet includes low-fat dairy products, low-fat (lean) meats, and fiber from whole grains, beans, and lots of fruits and vegetables. Stay current with your vaccines. Schedule regular health, dental, and eye exams. Summary Having a healthy lifestyle and getting preventive care can help to protect your health and wellness after age 67. Screening and testing are the best way to find a health problem early and help you avoid having a fall. Early diagnosis and treatment give you the best chance for managing medical conditions that are more common for people who are older than age 67. Falls are a major cause of broken bones and head injuries in people who are older than age 67. Take precautions to prevent a fall at home. Work with your health care provider to learn what changes you can make to improve your health and wellness and to prevent falls. This information is not intended to replace advice given to you by your health care provider. Make sure you discuss any questions you have with your health care provider. Document Revised: 07/15/2020 Document Reviewed: 07/15/2020 Elsevier Patient Education  2024 Elsevier Inc.  

## 2022-12-09 NOTE — Progress Notes (Signed)
   I, Stevenson Clinch, CMA acting as a scribe for Clementeen Graham, MD.  Katie Gardner is a 67 y.o. female who presents to [facility] at [facility] today for f/u L shoulder pain. Pt is LHD. Pt was last seen by Dr. Denyse Amass on 11/04/22 and she was referred to PT, completing 3 visits.   Today, pt reports some improvement of shoulder sx with PT. Continues to have some pain and tightness with lateral raises. Feels that ROM is improving overall, though progress is slow. Continues to have some pain and tightness left-sided neck. Denies new or worsening sx. Meloxicam has also been helpful.    Pertinent review of systems: No fevers or chills  Relevant historical information: Hypertension   Exam:  BP 120/78   Pulse 77   Ht 5' (1.524 m)   Wt 136 lb (61.7 kg)   SpO2 97%   BMI 26.56 kg/m  General: Well Developed, well nourished, and in no acute distress.   MSK: Left shoulder normal appearing. Range of motion abduction limited beyond 120 degrees. Functional internal rotation lumbar spine. External rotation intact. Intact strength     Assessment and Plan: 67 y.o. female with chronic left shoulder pain thought to be either rotator cuff tendinopathy with possible tear versus adhesive capsulitis.  There may be a component of both.  She is very reluctant to consider steroid injection.  She has been doing physical therapy for the last month which is helping.  She is improving but not fully improved.  We talked about options.  She is gena give PT and home exercise program another month.  If not better on recheck consider injection or MRI.  Advised that if she is going to do an injection she should probably take the alprazolam before the visit.   PDMP not reviewed this encounter. No orders of the defined types were placed in this encounter.  No orders of the defined types were placed in this encounter.    Discussed warning signs or symptoms. Please see discharge instructions. Patient  expresses understanding.   The above documentation has been reviewed and is accurate and complete Clementeen Graham, M.D.

## 2022-12-09 NOTE — Assessment & Plan Note (Signed)
Currently stable and well-controlled Continues Prozac 10 mg daily and alprazolam 0.5 mg as needed Stress management discussed

## 2022-12-09 NOTE — Progress Notes (Signed)
Mariha Fujii 67 y.o.   Chief Complaint  Patient presents with   Medical Management of Chronic Issues    Patient states she had dizzy spells last week, but she feels better this week.     HISTORY OF PRESENT ILLNESS: This is a 67 y.o. female here for follow-up of chronic medical conditions Was feeling dizzy last week but it was short-lived.  Asymptomatic today. No other complaints or medical concerns today.  HPI   [name]   Medication Sig Start Date End Date Taking? Authorizing Provider  ALPRAZolam (XANAX) 0.5 MG tablet TAKE 1 TABLET BY MOUTH EVERY DAY AS NEEDED FOR ANXIETY. 11/24/22  Yes Sonji Starkes, Eilleen Kempf, MD  cyclobenzaprine (FLEXERIL) 5 MG tablet TAKE 1 TABLET(5 MG) BY MOUTH THREE TIMES DAILY AS NEEDED FOR MUSCLE SPASMS 09/21/22  Yes Lylee Corrow, Eilleen Kempf, MD  FLUoxetine (PROZAC) 10 MG tablet Take 1 tablet (10 mg total) by mouth daily. 05/04/22  Yes Aubrielle Stroud, Eilleen Kempf, MD  fluticasone [facility]) 50 MCG/ACT nasal spray SHAKE LIQUID AND USE 2 SPRAYS IN EACH NOSTRIL DAILY 06/25/22  Yes Henson, Vickie L, NP-C  lisinopril (ZESTRIL) 10 MG tablet TAKE 1 TABLET(10 MG) BY MOUTH DAILY 11/15/21  Yes Yaniv Lage, Eilleen Kempf, MD  loratadine (CLARITIN) 10 MG tablet Take 1 tablet (10 mg total) by mouth daily. 09/12/18  Yes Georgina Quint, MD  meloxicam (MOBIC) 15 MG tablet TAKE 1 TABLET(15 MG) BY MOUTH DAILY FOR 10 DAYS 12/04/22  Yes Georgina Quint, MD  metoprolol tartrate (LOPRESSOR) 100 MG tablet Take 2 hours prior to CT scan 07/28/22  Yes Crenshaw, Madolyn Frieze, MD  rosuvastatin (CRESTOR) 10 MG tablet TAKE 1 TABLET(10 MG) BY MOUTH DAILY 04/01/21  Yes Alainna Stawicki, Eilleen Kempf, MD    Allergies  Allergen Reactions   Macrobid [Nitrofurantoin] Nausea And Vomiting   Hydrocodone Nausea And Vomiting    dizzy   Mucinex Dm [Dm-Guaifenesin Er] Swelling    Sob, throat closing    Patient Active Problem List   Diagnosis Date Noted   Acute pain of left shoulder 11/02/2022    Suspected sleep apnea 06/09/2022   Urinary incontinence without sensory awareness 03/05/2022   Chronic pain of left knee 02/17/2022   Stress incontinence in female 02/17/2022   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   Dyspareunia in female 09/13/2019   Skin tags, multiple acquired 07/27/2019   Chronic insomnia 05/31/2019   Essential hypertension 12/17/2017   Seasonal allergies 12/17/2017   Anxiety and depression 11/13/2016   Chronic anxiety 11/13/2016   Hot flashes    Situational anxiety    Dyslipidemia     Past Medical History:  Diagnosis Date   Allergy    seasonal   Anxiety    Depression    Family history of adverse reaction to anesthesia    sister hard to wake up   Hematuria    Hot flashes    Hx of cardiovascular stress test    Lex MV 12/13:  EF 68%, mild apical thinning, no ischemia   Hyperlipidemia    Hypertension    PONV (postoperative nausea and vomiting)     Past Surgical History:  Procedure Laterality Date   BREAST LUMPECTOMY     right breast, not cancer   Carpel tunnel     ECTOPIC PREGNANCY SURGERY      Social History   Socioeconomic History   Marital status: Divorced    Spouse name: Not on file   Number of children: 1   Years of education: 8th  grade   Highest education level: Not on file  Occupational History    Employer: UNEMPLOYED  Tobacco Use   Smoking status: Former    Types: Cigarettes   Smokeless tobacco: Never  Vaping Use   Vaping status: Never Used  Substance and Sexual Activity   Alcohol use: Yes    Comment: Occasional   Drug use: No   Sexual activity: Yes  Other Topics Concern   Not on file  Social History Narrative   Caffiene coffee 1 cup   Live alone.   Work" retired   Chemical engineer Strain: Low Risk  (06/03/2022)   Overall Financial Resource Strain (CARDIA)    Difficulty of Paying Living Expenses: Not hard at all  Food Insecurity: No Food Insecurity (06/03/2022)   Hunger Vital  Sign    Worried About Running Out of Food in the Last Year: Never true    Ran Out of Food in the Last Year: Never true  Transportation Needs: No Transportation Needs (06/03/2022)   PRAPARE - Administrator, Civil Service (Medical): No    Lack of Transportation (Non-Medical): No  Physical Activity: Inactive (06/03/2022)   Exercise Vital Sign    Days of Exercise per Week: 0 days    Minutes of Exercise per Session: 0 min  Stress: No Stress Concern Present (06/03/2022)   [employer] of Occupational Health - Occupational Stress Questionnaire    Feeling of Stress : Not at all  Social Connections: Socially Isolated (06/03/2022)   Social Connection and Isolation Panel [NHANES]    Frequency of Communication with Friends and Family: Once a week    Frequency of Social Gatherings with Friends and Family: Once a week    Attends Religious Services: Never    Database administrator or Organizations: No    Attends Banker Meetings: Never    Marital Status: Never married  Intimate Partner Violence: Not At Risk (06/03/2022)   Humiliation, Afraid, Rape, and Kick questionnaire    Fear of Current or Ex-Partner: No    Emotionally Abused: No    Physically Abused: No    Sexually Abused: No    Family History  Problem Relation Age of Onset   Heart disease Mother    Diabetes Mother    Esophageal cancer Father        95's   Diabetes Sister    Diabetes Sister    Colon cancer Neg Hx      Review of Systems  Constitutional: Negative.  Negative for chills and fever.  HENT: Negative.  Negative for congestion and sore throat.   Respiratory: Negative.  Negative for cough and shortness of breath.   Cardiovascular: Negative.  Negative for chest pain and palpitations.  Gastrointestinal:  Negative for abdominal pain, diarrhea, nausea and vomiting.  Genitourinary: Negative.  Negative for dysuria and hematuria.  Skin: Negative.  Negative for rash.  Neurological: Negative.  Negative  for dizziness and headaches.  All other systems reviewed and are negative.   Vitals:   12/09/22 1439  BP: 122/72  Pulse: 75  Temp: 98 F (36.7 C)  SpO2: 99%    Physical Exam Vitals reviewed.  Constitutional:      Appearance: Normal appearance.  HENT:     Head: Normocephalic.     Mouth/Throat:     Mouth: Mucous membranes are moist.     Pharynx: Oropharynx is clear.  Eyes:     Extraocular Movements: Extraocular movements intact.  Conjunctiva/sclera: Conjunctivae normal.     Pupils: Pupils are equal, round, and reactive to light.  Cardiovascular:     Rate and Rhythm: Normal rate and regular rhythm.     Pulses: Normal pulses.     Heart sounds: Normal heart sounds.  Pulmonary:     Effort: Pulmonary effort is normal.     Breath sounds: Normal breath sounds.  Musculoskeletal:     Cervical back: No tenderness.  Lymphadenopathy:     Cervical: No cervical adenopathy.  Skin:    General: Skin is warm and dry.     Capillary Refill: Capillary refill takes less than 2 seconds.  Neurological:     General: No focal deficit present.     Mental Status: She is alert and oriented to person, place, and time.  Psychiatric:        Mood and Affect: Mood normal.        Behavior: Behavior normal.      ASSESSMENT & PLAN: A total of 44 minutes was spent with the patient and counseling/coordination of care regarding preparing for this visit, review of most recent office visit notes, review of most recent sports medicine office visit notes, review of multiple chronic medical conditions under management, review of most recent blood work results, review of all medications, cardiovascular risks associated with hypertension and dyslipidemia, education on nutrition, stress management, prognosis, documentation, and need for follow-up.  Problem List Items Addressed This Visit       Cardiovascular and Mediastinum   Essential hypertension - Primary    BP Readings from Last 3 Encounters:   12/09/22 122/72  12/09/22 120/78  11/04/22 (!) 152/82  Well-controlled hypertension Continue lisinopril 10 mg daily Cardiovascular risks associated with hypertension discussed Dietary approaches to stop hypertension discussed       Relevant Orders   Hormone Panel   CBC with Differential/Platelet   Comprehensive metabolic panel   Hemoglobin A1c     Other   Dyslipidemia    Chronic stable condition Continue rosuvastatin 10 mg daily Diet and nutrition discussed Lipid profile done today The 10-year ASCVD risk score (Arnett DK, et al., 2019) is: 7.7%   Values used to calculate the score:     Age: 78 years     Sex: Female     Is Non-Hispanic African American: No     Diabetic: No     Tobacco smoker: No     Systolic Blood Pressure: 122 mmHg     Is BP treated: Yes     HDL Cholesterol: 65.8 mg/dL     Total Cholesterol: 189 mg/dL       Relevant Orders   Hormone Panel   CBC with Differential/Platelet   Comprehensive metabolic panel   Hemoglobin A1c   Anxiety and depression    Currently stable and well-controlled Continues Prozac 10 mg daily and alprazolam 0.5 mg as needed Stress management discussed      Relevant Orders   Hormone Panel   CBC with Differential/Platelet   Comprehensive metabolic panel   Hemoglobin A1c   Chronic anxiety   Relevant Orders   Hormone Panel   CBC with Differential/Platelet   Comprehensive metabolic panel   Hemoglobin A1c   Chronic pain of left knee    Slowly getting better. See sports medicine on a regular basis Seen and evaluated today      Acute pain of left shoulder    Much improved. Follows up with sports medicine Was seen earlier today  Other Visit Diagnoses     Screening for colon cancer       Relevant Orders   Cologuard        Patient Instructions  Health Maintenance After Age 2 After age 80, you are at a higher risk for certain long-term diseases and infections as well as injuries from falls. Falls are a  major cause of broken bones and head injuries in people who are older than age 86. Getting regular preventive care can help to keep you healthy and well. Preventive care includes getting regular testing and making lifestyle changes as recommended by your health care provider. Talk with your health care provider about: Which screenings and tests you should have. A screening is a test that checks for a disease when you have no symptoms. A diet and exercise plan that is right for you. What should I know about screenings and tests to prevent falls? Screening and testing are the best ways to find a health problem early. Early diagnosis and treatment give you the best chance of managing medical conditions that are common after age 20. Certain conditions and lifestyle choices may make you more likely to have a fall. Your health care provider may recommend: Regular vision checks. Poor vision and conditions such as cataracts can make you more likely to have a fall. If you wear glasses, make sure to get your prescription updated if your vision changes. Medicine review. Work with your health care provider to regularly review all of the medicines you are taking, including over-the-counter medicines. Ask your health care provider about any side effects that may make you more likely to have a fall. Tell your health care provider if any medicines that you take make you feel dizzy or sleepy. Strength and balance checks. Your health care provider may recommend certain tests to check your strength and balance while standing, walking, or changing positions. Foot health exam. Foot pain and numbness, as well as not wearing proper footwear, can make you more likely to have a fall. Screenings, including: Osteoporosis screening. Osteoporosis is a condition that causes the bones to get weaker and break more easily. Blood pressure screening. Blood pressure changes and medicines to control blood pressure can make you feel  dizzy. Depression screening. You may be more likely to have a fall if you have a fear of falling, feel depressed, or feel unable to do activities that you used to do. Alcohol use screening. Using too much alcohol can affect your balance and may make you more likely to have a fall. Follow these instructions at home: Lifestyle Do not drink alcohol if: Your health care provider tells you not to drink. If you drink alcohol: Limit how much you have to: 0-1 drink a day for women. 0-2 drinks a day for men. Know how much alcohol is in your drink. In the [place] one drink equals one 12 oz bottle of beer (355 mL), one 5 oz glass of wine (148 mL), or one 1 oz glass of hard liquor (44 mL). Do not use any products that contain nicotine or tobacco. These products include cigarettes, chewing tobacco, and vaping devices, such as e-cigarettes. If you need help quitting, ask your health care provider. Activity  Follow a regular exercise program to stay fit. This will help you maintain your balance. Ask your health care provider what types of exercise are appropriate for you. If you need a cane or walker, use it as recommended by your health care provider. Wear supportive shoes that  have nonskid soles. Safety  Remove any tripping hazards, such as rugs, cords, and clutter. Install safety equipment such as grab bars in bathrooms and safety rails on stairs. Keep rooms and walkways well-lit. General instructions Talk with your health care provider about your risks for falling. Tell your health care provider if: You fall. Be sure to tell your health care provider about all falls, even ones that seem minor. You feel dizzy, tiredness (fatigue), or off-balance. Take over-the-counter and prescription medicines only as told by your health care provider. These include supplements. Eat a healthy diet and maintain a healthy weight. A healthy diet includes low-fat dairy products, low-fat (lean) meats, and fiber from whole  grains, beans, and lots of fruits and vegetables. Stay current with your vaccines. Schedule regular health, dental, and eye exams. Summary Having a healthy lifestyle and getting preventive care can help to protect your health and wellness after age 38. Screening and testing are the best way to find a health problem early and help you avoid having a fall. Early diagnosis and treatment give you the best chance for managing medical conditions that are more common for people who are older than age 57. Falls are a major cause of broken bones and head injuries in people who are older than age 52. Take precautions to prevent a fall at home. Work with your health care provider to learn what changes you can make to improve your health and wellness and to prevent falls. This information is not intended to replace advice given to you by your health care provider. Make sure you discuss any questions you have with your health care provider. Document Revised: 07/15/2020 Document Reviewed: 07/15/2020 Elsevier Patient Education  2024 Elsevier Inc.     Edwina Barth, MD [facility]

## 2022-12-10 ENCOUNTER — Other Ambulatory Visit: Payer: Self-pay | Admitting: Emergency Medicine

## 2022-12-10 ENCOUNTER — Ambulatory Visit: Payer: 59 | Admitting: Physical Therapy

## 2022-12-10 DIAGNOSIS — M25512 Pain in left shoulder: Secondary | ICD-10-CM

## 2022-12-10 NOTE — Therapy (Signed)
OUTPATIENT PHYSICAL THERAPY SHOULDER TREATMENT   Patient Name: Katie Gardner MRN: [id number] DOB:1955-03-18, 67 y.o., female Today's Date: 12/11/2022  END OF SESSION:  PT End of Session - 12/11/22 1014     Visit Number 4    Date for PT Re-Evaluation 02/03/23    PT Start Time 1015    PT Stop Time 1100    PT Time Calculation (min) 45 min    Activity Tolerance Patient tolerated treatment well    Behavior During Therapy WFL for tasks assessed/performed              Past Medical History:  Diagnosis Date   Allergy    seasonal   Anxiety    Depression    Family history of adverse reaction to anesthesia    sister hard to wake up   Hematuria    Hot flashes    Hx of cardiovascular stress test    Lex MV 12/13:  EF 68%, mild apical thinning, no ischemia   Hyperlipidemia    Hypertension    PONV (postoperative nausea and vomiting)    Past Surgical History:  Procedure Laterality Date   BREAST LUMPECTOMY     right breast, not cancer   Carpel tunnel     ECTOPIC PREGNANCY SURGERY     Patient Active Problem List   Diagnosis Date Noted   Acute pain of left shoulder 11/02/2022   Suspected sleep apnea 06/09/2022   Urinary incontinence without sensory awareness 03/05/2022   Chronic pain of left knee 02/17/2022   Stress incontinence in female 02/17/2022   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   Dyspareunia in female 09/13/2019   Skin tags, multiple acquired 07/27/2019   Chronic insomnia 05/31/2019   Essential hypertension 12/17/2017   Seasonal allergies 12/17/2017   Anxiety and depression 11/13/2016   Chronic anxiety 11/13/2016   Hot flashes    Situational anxiety    Dyslipidemia     PCP: Georgina Quint, MD   REFERRING PROVIDER: Rodolph Bong, MD   REFERRING DIAG: [phone] (ICD-10-CM) - Left shoulder pain, unspecified chronicity   THERAPY DIAG:  Acute pain of left shoulder  Stiffness of left shoulder, not elsewhere classified  Muscle weakness  (generalized)  Other lack of coordination  Rationale for Evaluation and Treatment: Rehabilitation  ONSET DATE: 11/04/22  SUBJECTIVE:                                                                                                                                                                                      SUBJECTIVE STATEMENT: I think it is better, kind of. It is still stuck when I lift up.   Hand dominance: Left  PERTINENT HISTORY: Per referring physician note: Katie Gardner is a 67 y.o. female who presents to [facility] today for L shoulder pain ongoing for about a month. No MOI. Pt locates pain to anterior aspect of the shoulder radiating into the arm. Denies injury. Pt is LHD. Denies sharp shooting pains. Notes limited ROM, < 9 degrees with lateral raise. Denies n/t in the hands. Has developed left-sided neck pain over the past day.   PAIN:  Are you having pain? Yes: NPRS scale: 1/10 Pain location: L shoulder Pain description: sharp Aggravating factors: raising the arm Relieving factors: She is taking magnesium and Ibuprophen  PRECAUTIONS: Shoulder  RED FLAGS: None   WEIGHT BEARING RESTRICTIONS: No  FALLS:  Has patient fallen in last 6 months? No  LIVING ENVIRONMENT: Lives with: lives alone Lives in: House/apartment Stairs: no issues Has following equipment at home: None  OCCUPATION: On disability, anxiety and depression  PLOF: Independent, has to help the shoulder in elevation  PATIENT GOALS:Patient wants to get relief from the pain, return to her normal daily routines, work out, without pain.  NEXT MD VISIT:   OBJECTIVE:   DIAGNOSTIC FINDINGS:  Diagnostic Limited MSK Ultrasound of: Left shoulder Biceps tendon intact. Subscapularis tendon is intact with hypoechoic fluid collecting superficial to the subscapularis tendon consistent with a subdeltoid bursitis. Supraspinatus tendon is intact without visible tear with mild  subacromial bursitis present. Infraspinatus tendon is intact. AC joint mild effusion. Impression: Subdeltoid bursitis and subacromial bursitis.  COGNITION: Overall cognitive status: Within functional limits for tasks assessed     SENSATION: WFL  POSTURE: Mildly rounded shoulders, decreased thoracic and lumbar curves.  UPPER EXTREMITY ROM: RUE WNL, LUE WNL except where noted.  Active ROM Right eval Left eval  Shoulder flexion  55  Shoulder extension    Shoulder abduction  50  Shoulder adduction    Shoulder internal rotation  WNL  Shoulder external rotation  WNL  Elbow flexion    Elbow extension    Wrist flexion    Wrist extension    Wrist ulnar deviation    Wrist radial deviation    Wrist pronation    Wrist supination    (Blank rows = not tested)  UPPER EXTREMITY MMT:RUE WNL, L shoulder painful with any resistance.   SHOULDER SPECIAL TESTS: Impingement tests: Hawkins/Kennedy impingement test: positive  and Painful arc test: positive  Rotator cuff assessment: Drop arm test: positive , Empty can test: positive , and Full can test: positive  Biceps assessment: Speed's test: positive   JOINT MOBILITY TESTING:  Scapular mobility limited in all planes on L, GH inf and post glide moderately decreased, tight post capsule  PALPATION:  Very tight in B cerv paraspinals and UT. Very TTP over L UT, infraspinatus, supraspinatus tendon, very tight and TTP L pects.   TODAY'S TREATMENT:  DATE:  12/11/22 UBE L2 x61mins each way  Wall slides x10 flexion and abd  AAROM with dowel each way x10  IR/ER yellow band 2x10 Red band rows and ext 2x10 PROM to L shoulder all directions, grade 3 mobs inferior and posterior glides, lateral distraction  Supine chest press 2# WATE  Supine shoulder flexion 2# WATE 2x10   12/08/22 NuStep L5 x 6 min  AAORM Dowel  Fle, Ext, IR 2x5 LUE ER/IR yellow 2x10 yellow tband Row 2x10 Shoulder Ext yellow 2x10 Shoulder flex & abd x5 each LUE PROM in all directions with end range holds   12/02/22 UBE level 1 x 4 minutes Red tband Row 2x10 Red tband extension 2x10 Red tband ER 2x10 Horizontal abduction below shoulder level this did cause pain AAROM wall slides and circles STM to the left upper trap and into the neck area MHP/IFC to the left upper trap  11/25/22 Education Seated press ups and scapular retraction  PATIENT EDUCATION: Education details: POC Person educated: Patient Education method: Explanation Education comprehension: verbalized understanding  HOME EXERCISE PROGRAM: Verbal-Seated press ups and scap retraction  ASSESSMENT:  CLINICAL IMPRESSION: Patient is a 67 y.o. who was seen today for physical therapy treatment for L shoulder acromial and subdeltoid bursitis. Continued with exercises today focusing mostly on ROM and some light strengthening. She tolerate it all well with some pain and has some popping. Able to get full passive range in all directions with PROM. States lateral distraction feels good.   OBJECTIVE IMPAIRMENTS: decreased activity tolerance, decreased coordination, decreased ROM, decreased strength, impaired perceived functional ability, impaired flexibility, impaired UE functional use, and pain.   ACTIVITY LIMITATIONS: carrying, lifting, bathing, dressing, reach over head, and hygiene/grooming  PARTICIPATION LIMITATIONS: meal prep, cleaning, laundry, driving, shopping, and community activity  PERSONAL FACTORS: Past/current experiences are also affecting patient's functional outcome.   REHAB POTENTIAL: Good  CLINICAL DECISION MAKING: Stable/uncomplicated  EVALUATION COMPLEXITY: Low   GOALS: Goals reviewed with patient? Yes  SHORT TERM GOALS: Target date: 12/11/22  I with initial HEP Baseline: Goal status: met 12/02/22  LONG TERM GOALS: Target date:  02/03/23  I with final HEP Baseline:  Goal status: INITIAL  2.  Patient will recover L shoulder ROM equal to R shoulder Baseline: See assessment Goal status: INITIAL  3.  Patient will improve R shoulder strength to at least 4/5 Baseline: Unable to take any resistance due to pain Goal status: INITIAL  4.  Patient will report pain < 3/10 while performing normal daily activities. Baseline: Uses RUE to lift LUE up to reach her head Goal status: INITIAL  5.  Patient will report that her pain no longer impedes sleep Baseline:  Goal status: INITIAL  PLAN:  PT FREQUENCY: 1-2x/week  PT DURATION: 10 weeks  PLANNED INTERVENTIONS: Therapeutic exercises, Therapeutic activity, Neuromuscular re-education, Balance training, Gait training, Patient/Family education, Self Care, Joint mobilization, Dry Needling, Electrical stimulation, Spinal mobilization, Cryotherapy, Moist heat, Taping, Vasopneumatic device, Ionotophoresis 4mg /ml Dexamethasone, and Manual therapy  PLAN FOR NEXT SESSION: Work on function , see if we can decrease the pain and the knot, seems like due to pain she has elevated the shoulder and caused the spasms   Cassie Freer, DPT 12/11/2022, 11:00 AM

## 2022-12-11 ENCOUNTER — Ambulatory Visit: Payer: 59

## 2022-12-11 DIAGNOSIS — R278 Other lack of coordination: Secondary | ICD-10-CM

## 2022-12-11 DIAGNOSIS — M25512 Pain in left shoulder: Secondary | ICD-10-CM

## 2022-12-11 DIAGNOSIS — M6281 Muscle weakness (generalized): Secondary | ICD-10-CM | POA: Diagnosis not present

## 2022-12-11 DIAGNOSIS — M25612 Stiffness of left shoulder, not elsewhere classified: Secondary | ICD-10-CM | POA: Diagnosis not present

## 2022-12-14 ENCOUNTER — Ambulatory Visit: Payer: 59 | Admitting: Physical Therapy

## 2022-12-17 ENCOUNTER — Encounter: Payer: Self-pay | Admitting: Physical Therapy

## 2022-12-17 ENCOUNTER — Ambulatory Visit: Payer: 59 | Admitting: Physical Therapy

## 2022-12-17 DIAGNOSIS — M25612 Stiffness of left shoulder, not elsewhere classified: Secondary | ICD-10-CM | POA: Diagnosis not present

## 2022-12-17 DIAGNOSIS — M25512 Pain in left shoulder: Secondary | ICD-10-CM

## 2022-12-17 DIAGNOSIS — M6281 Muscle weakness (generalized): Secondary | ICD-10-CM | POA: Diagnosis not present

## 2022-12-17 DIAGNOSIS — R278 Other lack of coordination: Secondary | ICD-10-CM | POA: Diagnosis not present

## 2022-12-17 NOTE — Therapy (Signed)
OUTPATIENT PHYSICAL THERAPY SHOULDER TREATMENT   Patient Name: Katie Gardner MRN: [id number] DOB:[birth year], 67 y.o., female Today's Date: 12/17/2022  END OF SESSION:  PT End of Session - 12/17/22 1100     Visit Number 5    Date for PT Re-Evaluation 02/03/23    PT Start Time 1100    PT Stop Time 1145    PT Time Calculation (min) 45 min    Activity Tolerance Patient tolerated treatment well    Behavior During Therapy WFL for tasks assessed/performed              Past Medical History:  Diagnosis Date   Allergy    seasonal   Anxiety    Depression    Family history of adverse reaction to anesthesia    sister hard to wake up   Hematuria    Hot flashes    Hx of cardiovascular stress test    Lex MV 12/13:  EF 68%, mild apical thinning, no ischemia   Hyperlipidemia    Hypertension    PONV (postoperative nausea and vomiting)    Past Surgical History:  Procedure Laterality Date   BREAST LUMPECTOMY     right breast, not cancer   Carpel tunnel     ECTOPIC PREGNANCY SURGERY     Patient Active Problem List   Diagnosis Date Noted   Acute pain of left shoulder 11/02/2022   Suspected sleep apnea 06/09/2022   Urinary incontinence without sensory awareness 03/05/2022   Chronic pain of left knee 02/17/2022   Stress incontinence in female 02/17/2022   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   Dyspareunia in female 09/13/2019   Skin tags, multiple acquired 07/27/2019   Chronic insomnia 05/31/2019   Essential hypertension 12/17/2017   Seasonal allergies 12/17/2017   Anxiety and depression 11/13/2016   Chronic anxiety 11/13/2016   Hot flashes    Situational anxiety    Dyslipidemia     PCP: Georgina Quint, MD   REFERRING PROVIDER: Rodolph Bong, MD   REFERRING DIAG: [phone] (ICD-10-CM) - Left shoulder pain, unspecified chronicity   THERAPY DIAG:  Acute pain of left shoulder  Stiffness of left shoulder, not elsewhere classified  Muscle weakness  (generalized)  Rationale for Evaluation and Treatment: Rehabilitation  ONSET DATE: 11/04/22  SUBJECTIVE:                                                                                                                                                                                      SUBJECTIVE STATEMENT: So far so good, getting better can't complain  Hand dominance: Left  PERTINENT HISTORY: Per referring physician note: Katie Gardner is a 67 y.o. female  who presents to [facility] at [facility] today for L shoulder pain ongoing for about a month. No MOI. Pt locates pain to anterior aspect of the shoulder radiating into the arm. Denies injury. Pt is LHD. Denies sharp shooting pains. Notes limited ROM, < 9 degrees with lateral raise. Denies n/t in the hands. Has developed left-sided neck pain over the past day.   PAIN:  Are you having pain? Yes: NPRS scale: 1/10 Pain location: L shoulder Pain description: sharp Aggravating factors: raising the arm Relieving factors: She is taking magnesium and Ibuprophen  PRECAUTIONS: Shoulder  RED FLAGS: None   WEIGHT BEARING RESTRICTIONS: No  FALLS:  Has patient fallen in last 6 months? No  LIVING ENVIRONMENT: Lives with: lives alone Lives in: House/apartment Stairs: no issues Has following equipment at home: None  OCCUPATION: On disability, anxiety and depression  PLOF: Independent, has to help the shoulder in elevation  PATIENT GOALS:Patient wants to get relief from the pain, return to her normal daily routines, work out, without pain.  NEXT MD VISIT:   OBJECTIVE:   DIAGNOSTIC FINDINGS:  Diagnostic Limited MSK Ultrasound of: Left shoulder Biceps tendon intact. Subscapularis tendon is intact with hypoechoic fluid collecting superficial to the subscapularis tendon consistent with a subdeltoid bursitis. Supraspinatus tendon is intact without visible tear with mild subacromial bursitis present. Infraspinatus tendon  is intact. AC joint mild effusion. Impression: Subdeltoid bursitis and subacromial bursitis.  COGNITION: Overall cognitive status: Within functional limits for tasks assessed     SENSATION: WFL  POSTURE: Mildly rounded shoulders, decreased thoracic and lumbar curves.  UPPER EXTREMITY ROM: RUE WNL, LUE WNL except where noted.  Active ROM Right eval Left eval  Shoulder flexion  55  Shoulder extension    Shoulder abduction  50  Shoulder adduction    Shoulder internal rotation  WNL  Shoulder external rotation  WNL  Elbow flexion    Elbow extension    Wrist flexion    Wrist extension    Wrist ulnar deviation    Wrist radial deviation    Wrist pronation    Wrist supination    (Blank rows = not tested)  UPPER EXTREMITY MMT:RUE WNL, L shoulder painful with any resistance.   SHOULDER SPECIAL TESTS: Impingement tests: Hawkins/Kennedy impingement test: positive  and Painful arc test: positive  Rotator cuff assessment: Drop arm test: positive , Empty can test: positive , and Full can test: positive  Biceps assessment: Speed's test: positive   JOINT MOBILITY TESTING:  Scapular mobility limited in all planes on L, GH inf and post glide moderately decreased, tight post capsule  PALPATION:  Very tight in B cerv paraspinals and UT. Very TTP over L UT, infraspinatus, supraspinatus tendon, very tight and TTP L pects.   TODAY'S TREATMENT:  DATE:  12/17/22 UBE L2 x82mins each way  Wall slides x10 flexion and abd  Shoulder Flex 1lb 2x10 Standing rows and Ext red 2x10 IR/ER yellow band 2x10 AAORM 1lb WaTE Flex, Ext, IR x10 PROM to L shoulder all directions, grade 3 mobs inferior and posterior glides, lateral distraction  Supine ER/IR 1lb x 10 each  12/11/22 UBE L2 x46mins each way  Wall slides x10 flexion and abd  AAROM with dowel each way x10   IR/ER yellow band 2x10 Red band rows and ext 2x10 PROM to L shoulder all directions, grade 3 mobs inferior and posterior glides, lateral distraction  Supine chest press 2# WATE  Supine shoulder flexion 2# WATE 2x10   12/08/22 NuStep L5 x 6 min  AAORM Dowel Fle, Ext, IR 2x5 LUE ER/IR yellow 2x10 yellow tband Row 2x10 Shoulder Ext yellow 2x10 Shoulder flex & abd x5 each LUE PROM in all directions with end range holds   12/02/22 UBE level 1 x 4 minutes Red tband Row 2x10 Red tband extension 2x10 Red tband ER 2x10 Horizontal abduction below shoulder level this did cause pain AAROM wall slides and circles STM to the left upper trap and into the neck area MHP/IFC to the left upper trap  11/25/22 Education Seated press ups and scapular retraction  PATIENT EDUCATION: Education details: POC Person educated: Patient Education method: Explanation Education comprehension: verbalized understanding  HOME EXERCISE PROGRAM: Verbal-Seated press ups and scap retraction  ASSESSMENT:  CLINICAL IMPRESSION: Patient is a 67 y.o. who was seen today for physical therapy treatment for L shoulder acromial and subdeltoid bursitis. Continued with exercises today focusing mostly on ROM and some light strengthening. She tolerate it all well with some pain and has some popping with internal and external rotation. Able to get full passive range in all directions with PROM. States lateral distraction feels good.   OBJECTIVE IMPAIRMENTS: decreased activity tolerance, decreased coordination, decreased ROM, decreased strength, impaired perceived functional ability, impaired flexibility, impaired UE functional use, and pain.   ACTIVITY LIMITATIONS: carrying, lifting, bathing, dressing, reach over head, and hygiene/grooming  PARTICIPATION LIMITATIONS: meal prep, cleaning, laundry, driving, shopping, and community activity  PERSONAL FACTORS: Past/current experiences are also affecting patient's functional  outcome.   REHAB POTENTIAL: Good  CLINICAL DECISION MAKING: Stable/uncomplicated  EVALUATION COMPLEXITY: Low   GOALS: Goals reviewed with patient? Yes  SHORT TERM GOALS: Target date: 12/11/22  I with initial HEP Baseline: Goal status: met 12/02/22  LONG TERM GOALS: Target date: 02/03/23  I with final HEP Baseline:  Goal status: INITIAL  2.  Patient will recover L shoulder ROM equal to R shoulder Baseline: See assessment Goal status: INITIAL  3.  Patient will improve R shoulder strength to at least 4/5 Baseline: Unable to take any resistance due to pain Goal status: INITIAL  4.  Patient will report pain < 3/10 while performing normal daily activities. Baseline: Uses RUE to lift LUE up to reach her head Goal status: INITIAL  5.  Patient will report that her pain no longer impedes sleep Baseline:  Goal status: INITIAL  PLAN:  PT FREQUENCY: 1-2x/week  PT DURATION: 10 weeks  PLANNED INTERVENTIONS: Therapeutic exercises, Therapeutic activity, Neuromuscular re-education, Balance training, Gait training, Patient/Family education, Self Care, Joint mobilization, Dry Needling, Electrical stimulation, Spinal mobilization, Cryotherapy, Moist heat, Taping, Vasopneumatic device, Ionotophoresis 4mg /ml Dexamethasone, and Manual therapy  PLAN FOR NEXT SESSION: Work on function , see if we can decrease the pain and the knot, seems like due to pain she  has elevated the shoulder and caused the spasms   Cassie Freer, DPT 12/17/2022, 11:01 AM

## 2022-12-21 NOTE — Therapy (Incomplete)
OUTPATIENT PHYSICAL THERAPY SHOULDER TREATMENT   Patient Name: Katie Gardner MRN: [id number] DOB:1955-11-28, 67 y.o., female Today's Date: 12/21/2022  END OF SESSION:     Past Medical History:  Diagnosis Date   Allergy    seasonal   Anxiety    Depression    Family history of adverse reaction to anesthesia    sister hard to wake up   Hematuria    Hot flashes    Hx of cardiovascular stress test    Lex MV 12/13:  EF 68%, mild apical thinning, no ischemia   Hyperlipidemia    Hypertension    PONV (postoperative nausea and vomiting)    Past Surgical History:  Procedure Laterality Date   BREAST LUMPECTOMY     right breast, not cancer   Carpel tunnel     ECTOPIC PREGNANCY SURGERY     Patient Active Problem List   Diagnosis Date Noted   Acute pain of left shoulder 11/02/2022   Suspected sleep apnea 06/09/2022   Urinary incontinence without sensory awareness 03/05/2022   Chronic pain of left knee 02/17/2022   Stress incontinence in female 02/17/2022   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   Dyspareunia in female 09/13/2019   Skin tags, multiple acquired 07/27/2019   Chronic insomnia 05/31/2019   Essential hypertension 12/17/2017   Seasonal allergies 12/17/2017   Anxiety and depression 11/13/2016   Chronic anxiety 11/13/2016   Hot flashes    Situational anxiety    Dyslipidemia     PCP: Georgina Quint, MD   REFERRING PROVIDER: Rodolph Bong, MD   REFERRING DIAG: [phone] (ICD-10-CM) - Left shoulder pain, unspecified chronicity   THERAPY DIAG:  No diagnosis found.  Rationale for Evaluation and Treatment: Rehabilitation  ONSET DATE: 11/04/22  SUBJECTIVE:                                                                                                                                                                                      SUBJECTIVE STATEMENT: So far so good, getting better can't complain  Hand dominance: Left  PERTINENT  HISTORY: Per referring physician note: Katie Gardner is a 67 y.o. female who presents to [facility] at [facility] today for L shoulder pain ongoing for about a month. No MOI. Pt locates pain to anterior aspect of the shoulder radiating into the arm. Denies injury. Pt is LHD. Denies sharp shooting pains. Notes limited ROM, < 9 degrees with lateral raise. Denies n/t in the hands. Has developed left-sided neck pain over the past day.   PAIN:  Are you having pain? Yes: NPRS scale: 1/10 Pain location: L shoulder Pain description: sharp Aggravating factors: raising  the arm Relieving factors: She is taking magnesium and Ibuprophen  PRECAUTIONS: Shoulder  RED FLAGS: None   WEIGHT BEARING RESTRICTIONS: No  FALLS:  Has patient fallen in last 6 months? No  LIVING ENVIRONMENT: Lives with: lives alone Lives in: House/apartment Stairs: no issues Has following equipment at home: None  OCCUPATION: On disability, anxiety and depression  PLOF: Independent, has to help the shoulder in elevation  PATIENT GOALS:Patient wants to get relief from the pain, return to her normal daily routines, work out, without pain.  NEXT MD VISIT:   OBJECTIVE:   DIAGNOSTIC FINDINGS:  Diagnostic Limited MSK Ultrasound of: Left shoulder Biceps tendon intact. Subscapularis tendon is intact with hypoechoic fluid collecting superficial to the subscapularis tendon consistent with a subdeltoid bursitis. Supraspinatus tendon is intact without visible tear with mild subacromial bursitis present. Infraspinatus tendon is intact. AC joint mild effusion. Impression: Subdeltoid bursitis and subacromial bursitis.  COGNITION: Overall cognitive status: Within functional limits for tasks assessed     SENSATION: WFL  POSTURE: Mildly rounded shoulders, decreased thoracic and lumbar curves.  UPPER EXTREMITY ROM: RUE WNL, LUE WNL except where noted.  Active ROM Right eval Left eval  Shoulder flexion  55   Shoulder extension    Shoulder abduction  50  Shoulder adduction    Shoulder internal rotation  WNL  Shoulder external rotation  WNL  Elbow flexion    Elbow extension    Wrist flexion    Wrist extension    Wrist ulnar deviation    Wrist radial deviation    Wrist pronation    Wrist supination    (Blank rows = not tested)  UPPER EXTREMITY MMT:RUE WNL, L shoulder painful with any resistance.   SHOULDER SPECIAL TESTS: Impingement tests: Hawkins/Kennedy impingement test: positive  and Painful arc test: positive  Rotator cuff assessment: Drop arm test: positive , Empty can test: positive , and Full can test: positive  Biceps assessment: Speed's test: positive   JOINT MOBILITY TESTING:  Scapular mobility limited in all planes on L, GH inf and post glide moderately decreased, tight post capsule  PALPATION:  Very tight in B cerv paraspinals and UT. Very TTP over L UT, infraspinatus, supraspinatus tendon, very tight and TTP L pects.   TODAY'S TREATMENT:                                                                                                                                         DATE:  12/22/22 UBE Shoulder flexion and behind the back extenion with 1# WaTE bar Scapular retraction with red band  Standing rows and ext red PROM to L shoulder all directions, grade 3 mobs inferior and posterior glides, lateral distraction  Sidelying ER 1#  SA punches   12/17/22 UBE L2 x3mins each way  Wall slides x10 flexion and abd  Shoulder Flex 1lb 2x10 Standing rows and Ext red 2x10 IR/ER yellow  band 2x10 AAORM 1lb WaTE Flex, Ext, IR x10 PROM to L shoulder all directions, grade 3 mobs inferior and posterior glides, lateral distraction  Supine ER/IR 1lb x 10 each  12/11/22 UBE L2 x33mins each way  Wall slides x10 flexion and abd  AAROM with dowel each way x10  IR/ER yellow band 2x10 Red band rows and ext 2x10 PROM to L shoulder all directions, grade 3 mobs inferior and posterior  glides, lateral distraction  Supine chest press 2# WATE  Supine shoulder flexion 2# WATE 2x10   12/08/22 NuStep L5 x 6 min  AAORM Dowel Fle, Ext, IR 2x5 LUE ER/IR yellow 2x10 yellow tband Row 2x10 Shoulder Ext yellow 2x10 Shoulder flex & abd x5 each LUE PROM in all directions with end range holds   12/02/22 UBE level 1 x 4 minutes Red tband Row 2x10 Red tband extension 2x10 Red tband ER 2x10 Horizontal abduction below shoulder level this did cause pain AAROM wall slides and circles STM to the left upper trap and into the neck area MHP/IFC to the left upper trap  11/25/22 Education Seated press ups and scapular retraction  PATIENT EDUCATION: Education details: POC Person educated: Patient Education method: Explanation Education comprehension: verbalized understanding  HOME EXERCISE PROGRAM: Verbal-Seated press ups and scap retraction  ASSESSMENT:  CLINICAL IMPRESSION: Patient is a 67 y.o. who was seen today for physical therapy treatment for L shoulder acromial and subdeltoid bursitis. Continued with exercises today focusing mostly on ROM and some light strengthening. She tolerate it all well with some pain and has some popping with internal and external rotation. Able to get full passive range in all directions with PROM. States lateral distraction feels good.   OBJECTIVE IMPAIRMENTS: decreased activity tolerance, decreased coordination, decreased ROM, decreased strength, impaired perceived functional ability, impaired flexibility, impaired UE functional use, and pain.   ACTIVITY LIMITATIONS: carrying, lifting, bathing, dressing, reach over head, and hygiene/grooming  PARTICIPATION LIMITATIONS: meal prep, cleaning, laundry, driving, shopping, and community activity  PERSONAL FACTORS: Past/current experiences are also affecting patient's functional outcome.   REHAB POTENTIAL: Good  CLINICAL DECISION MAKING: Stable/uncomplicated  EVALUATION COMPLEXITY:  Low   GOALS: Goals reviewed with patient? Yes  SHORT TERM GOALS: Target date: 12/11/22  I with initial HEP Baseline: Goal status: met 12/02/22  LONG TERM GOALS: Target date: 02/03/23  I with final HEP Baseline:  Goal status: INITIAL  2.  Patient will recover L shoulder ROM equal to R shoulder Baseline: See assessment Goal status: INITIAL  3.  Patient will improve R shoulder strength to at least 4/5 Baseline: Unable to take any resistance due to pain Goal status: INITIAL  4.  Patient will report pain < 3/10 while performing normal daily activities. Baseline: Uses RUE to lift LUE up to reach her head Goal status: INITIAL  5.  Patient will report that her pain no longer impedes sleep Baseline:  Goal status: INITIAL  PLAN:  PT FREQUENCY: 1-2x/week  PT DURATION: 10 weeks  PLANNED INTERVENTIONS: Therapeutic exercises, Therapeutic activity, Neuromuscular re-education, Balance training, Gait training, Patient/Family education, Self Care, Joint mobilization, Dry Needling, Electrical stimulation, Spinal mobilization, Cryotherapy, Moist heat, Taping, Vasopneumatic device, Ionotophoresis 4mg /ml Dexamethasone, and Manual therapy  PLAN FOR NEXT SESSION: Work on function , see if we can decrease the pain and the knot, seems like due to pain she has elevated the shoulder and caused the spasms   Cassie Freer, DPT 12/21/2022, 1:32 PM

## 2022-12-22 ENCOUNTER — Other Ambulatory Visit: Payer: Self-pay | Admitting: Emergency Medicine

## 2022-12-22 ENCOUNTER — Ambulatory Visit: Payer: 59

## 2022-12-22 ENCOUNTER — Encounter: Payer: Self-pay | Admitting: Emergency Medicine

## 2022-12-22 DIAGNOSIS — M25512 Pain in left shoulder: Secondary | ICD-10-CM

## 2022-12-23 LAB — HORMONE PANEL (T4,TSH,FSH,TESTT,SHBG,DHEA,ETC)
DHEA-Sulfate, LCMS: 166 ug/dL — ABNORMAL HIGH
Estradiol, Serum, MS: 6.5 pg/mL
Estrone Sulfate: 101 ng/dL
Follicle Stimulating Hormone: 128 m[IU]/mL
Free T-3: 3.3 pg/mL
Free Testosterone, Serum: 1.4 pg/mL
Progesterone, Serum: <10 ng/dL
Sex Hormone Binding Globulin: 116 nmol/L
T4: 9.4 ug/dL
TSH: 1.1 uU/mL
Testosterone, Serum (Total): 20 ng/dL
Testosterone-% Free: 0.7 %
Triiodothyronine (T-3), Serum: 132 ng/dL

## 2022-12-28 ENCOUNTER — Encounter: Payer: Self-pay | Admitting: Emergency Medicine

## 2022-12-29 NOTE — Telephone Encounter (Signed)
Call patient and find out more information.  Not clear what she is asking about.  Please clarify.

## 2022-12-31 NOTE — Telephone Encounter (Signed)
Please call patient and let her know that you received copy of the letter.  Thanks

## 2022-12-31 NOTE — Telephone Encounter (Signed)
Okay to provide letter as requested.  Thanks.

## 2022-12-31 NOTE — Telephone Encounter (Signed)
LVM for patient to call back. Need understanding of what "[facility] letter " patient is needing

## 2022-12-31 NOTE — Telephone Encounter (Signed)
Patient has returned all and is sending a picture of the papers she was referring to in a new encounter

## 2023-01-04 ENCOUNTER — Ambulatory Visit (INDEPENDENT_AMBULATORY_CARE_PROVIDER_SITE_OTHER): Payer: 59 | Admitting: Emergency Medicine

## 2023-01-04 ENCOUNTER — Encounter: Payer: Self-pay | Admitting: Emergency Medicine

## 2023-01-04 VITALS — BP 130/86 | HR 76 | Temp 98.1°F | Ht 60.0 in | Wt 139.4 lb

## 2023-01-04 DIAGNOSIS — M25462 Effusion, left knee: Secondary | ICD-10-CM

## 2023-01-04 DIAGNOSIS — M25562 Pain in left knee: Secondary | ICD-10-CM

## 2023-01-04 MED ORDER — MELOXICAM 15 MG PO TABS: 15.0000 mg | ORAL_TABLET | Freq: Every day | ORAL | 1 refills | Status: DC

## 2023-01-04 NOTE — Progress Notes (Signed)
Katie Gardner 67 y.o.   Chief Complaint  Patient presents with   Joint Swelling    Patient is having some swelling in her left knee, patient states it started yesterday. She states it is painful     HISTORY OF PRESENT ILLNESS: This is a 67 y.o. female complaining of pain and swelling to her left knee that started 2 days ago Denies injury.  No other associated symptoms No other complaints or medical concerns today.  HPI   [name]   Medication Sig Start Date End Date Taking? Authorizing Provider  ALPRAZolam (XANAX) 0.5 MG tablet TAKE 1 TABLET BY MOUTH EVERY DAY AS NEEDED FOR ANXIETY. 11/24/22  Yes Krosby Ritchie, Eilleen Kempf, MD  cyclobenzaprine (FLEXERIL) 5 MG tablet TAKE 1 TABLET(5 MG) BY MOUTH THREE TIMES DAILY AS NEEDED FOR MUSCLE SPASMS 09/21/22  Yes Joan Avetisyan, Eilleen Kempf, MD  FLUoxetine (PROZAC) 10 MG tablet Take 1 tablet (10 mg total) by mouth daily. 05/04/22  Yes Iraida Cragin, Eilleen Kempf, MD  fluticasone [facility]) 50 MCG/ACT nasal spray SHAKE LIQUID AND USE 2 SPRAYS IN EACH NOSTRIL DAILY 06/25/22  Yes Henson, Vickie L, NP-C  lisinopril (ZESTRIL) 10 MG tablet TAKE 1 TABLET(10 MG) BY MOUTH DAILY 11/15/21  Yes Nimai Burbach, Eilleen Kempf, MD  loratadine (CLARITIN) 10 MG tablet Take 1 tablet (10 mg total) by mouth daily. 09/12/18  Yes Jenney Brester, Eilleen Kempf, MD  rosuvastatin (CRESTOR) 10 MG tablet TAKE 1 TABLET(10 MG) BY MOUTH DAILY 04/01/21  Yes Jedrek Dinovo, Eilleen Kempf, MD  meloxicam (MOBIC) 15 MG tablet Take 1 tablet (15 mg total) by mouth daily. 01/04/23   Georgina Quint, MD    Allergies  Allergen Reactions   Macrobid [Nitrofurantoin] Nausea And Vomiting   Hydrocodone Nausea And Vomiting    dizzy   Mucinex Dm [Dm-Guaifenesin Er] Swelling    Sob, throat closing    Patient Active Problem List   Diagnosis Date Noted   Acute pain of left shoulder 11/02/2022   Suspected sleep apnea 06/09/2022   Urinary incontinence without sensory awareness 03/05/2022   Chronic pain of  left knee 02/17/2022   Stress incontinence in female 02/17/2022   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   Dyspareunia in female 09/13/2019   Skin tags, multiple acquired 07/27/2019   Chronic insomnia 05/31/2019   Essential hypertension 12/17/2017   Seasonal allergies 12/17/2017   Anxiety and depression 11/13/2016   Chronic anxiety 11/13/2016   Hot flashes    Situational anxiety    Dyslipidemia     Past Medical History:  Diagnosis Date   Allergy    seasonal   Anxiety    Depression    Family history of adverse reaction to anesthesia    sister hard to wake up   Hematuria    Hot flashes    Hx of cardiovascular stress test    Lex MV 12/13:  EF 68%, mild apical thinning, no ischemia   Hyperlipidemia    Hypertension    PONV (postoperative nausea and vomiting)     Past Surgical History:  Procedure Laterality Date   BREAST LUMPECTOMY     right breast, not cancer   Carpel tunnel     ECTOPIC PREGNANCY SURGERY      Social History   Socioeconomic History   Marital status: Divorced    Spouse name: Not on file   Number of children: 1   Years of education: 8th grade   Highest education level: Not on file  Occupational History    Employer: UNEMPLOYED  Tobacco Use   Smoking status: Former    Types: Cigarettes   Smokeless tobacco: Never  Vaping Use   Vaping status: Never Used  Substance and Sexual Activity   Alcohol use: Yes    Comment: Occasional   Drug use: No   Sexual activity: Yes  Other Topics Concern   Not on file  Social History Narrative   Caffiene coffee 1 cup   Live alone.   Work" retired   Chemical engineer Strain: Low Risk  (06/03/2022)   Overall Financial Resource Strain (CARDIA)    Difficulty of Paying Living Expenses: Not hard at all  Food Insecurity: No Food Insecurity (06/03/2022)   Hunger Vital Sign    Worried About Running Out of Food in the Last Year: Never true    Ran Out of Food in the Last Year:  Never true  Transportation Needs: No Transportation Needs (06/03/2022)   PRAPARE - Administrator, Civil Service (Medical): No    Lack of Transportation (Non-Medical): No  Physical Activity: Inactive (06/03/2022)   Exercise Vital Sign    Days of Exercise per Week: 0 days    Minutes of Exercise per Session: 0 min  Stress: No Stress Concern Present (06/03/2022)   [employer] of Occupational Health - Occupational Stress Questionnaire    Feeling of Stress : Not at all  Social Connections: Socially Isolated (06/03/2022)   Social Connection and Isolation Panel [NHANES]    Frequency of Communication with Friends and Family: Once a week    Frequency of Social Gatherings with Friends and Family: Once a week    Attends Religious Services: Never    Database administrator or Organizations: No    Attends Banker Meetings: Never    Marital Status: Never married  Intimate Partner Violence: Not At Risk (06/03/2022)   Humiliation, Afraid, Rape, and Kick questionnaire    Fear of Current or Ex-Partner: No    Emotionally Abused: No    Physically Abused: No    Sexually Abused: No    Family History  Problem Relation Age of Onset   Heart disease Mother    Diabetes Mother    Esophageal cancer Father        71's   Diabetes Sister    Diabetes Sister    Colon cancer Neg Hx      Review of Systems  Constitutional: Negative.  Negative for chills and fever.  HENT: Negative.  Negative for congestion and sore throat.   Respiratory: Negative.  Negative for cough and shortness of breath.   Cardiovascular: Negative.  Negative for chest pain and palpitations.  Gastrointestinal:  Negative for abdominal pain, diarrhea, nausea and vomiting.  Genitourinary: Negative.  Negative for dysuria and hematuria.  Skin: Negative.  Negative for rash.  Neurological: Negative.  Negative for dizziness and headaches.  All other systems reviewed and are negative.   Vitals:   01/04/23 1544  BP:  130/86  Pulse: 76  Temp: 98.1 F (36.7 C)  SpO2: 96%    Physical Exam Vitals reviewed.  Constitutional:      Appearance: Normal appearance.  HENT:     Head: Normocephalic.  Eyes:     Extraocular Movements: Extraocular movements intact.  Cardiovascular:     Rate and Rhythm: Normal rate.  Pulmonary:     Effort: Pulmonary effort is normal.  Musculoskeletal:     Comments: Left knee: No erythema or ecchymosis.  Mild medial swelling.  No significant tenderness.  Full range of motion.  Skin:    General: Skin is warm and dry.  Neurological:     Mental Status: She is alert and oriented to person, place, and time.  Psychiatric:        Mood and Affect: Mood normal.        Behavior: Behavior normal.      ASSESSMENT & PLAN: A total of 32 minutes was spent with the patient and counseling/coordination of care regarding preparing for this visit, review of most recent office visit notes, review of chronic medical conditions under management, review of all medications, diagnosis and findings of left knee effusion, pain management, prognosis, documentation, need for follow-up if no better or worse during the next several days.  Problem List Items Addressed This Visit       Other   Pain and swelling of left knee - Primary    Mild effusion on physical examination No red flag signs or symptoms Recommend to start meloxicam 15 mg daily for 10 days Warm compresses on and off May need follow-up with sports medicine.      Relevant Medications   meloxicam (MOBIC) 15 MG tablet   Patient Instructions  Knee Effusion Knee effusion means that you have extra fluid in your knee. This can cause pain and swelling. Your knee may be more difficult to bend and move. Common causes of knee effusion are: Arthritis. Infection. Injury. Autoimmune disease. This means that your body's defense system (immune system) mistakenly attacks healthy body tissues. Follow these instructions at home: If you have a  brace or an immobilizer: Wear it as told by your doctor. Take it off only as told by your doctor. Check the skin around it every day. Tell your doctor if you see problems. Loosen the brace or immobilizer if your toes: Tingle. Become numb. Turn cold and blue. Keep the brace or immobilizer clean. If the brace or immobilizer is not waterproof: Do not let it get wet. Cover it with a watertight covering when you take a bath or shower. Managing pain, stiffness, and swelling  If told, put ice on the affected area. To do this: If you have a removable brace or immobilizer, take it off as told by your doctor. Put ice in a plastic bag. Place a towel between your skin and the bag. Leave the ice on for 20 minutes, 2-3 times a day. Take off the ice if your skin turns bright red. This is very important. If you cannot feel pain, heat, or cold, you have a greater risk of damage to the area. Move your toes often. Raise your knee above the level of your heart while you are sitting or lying down. Activity Rest as told by your doctor. Use crutches to support your body weight. Do not use your injured leg to support your body weight until your doctor says that you can. Do exercises as told by your doctor. General instructions Take over-the-counter and prescription medicines only as told by your doctor. Do not smoke or use any products that contain nicotine or tobacco. If you need help quitting, ask your doctor. Wear an elastic bandage or a wrap that puts pressure on your knee (compression wrap) as told by your doctor. Keep all follow-up visits. Contact a doctor if: You continue to have pain in your knee. You have a fever or chills. Get help right away if: You have swelling or redness in your knee that gets worse or does not get better.  You have very bad pain in your knee. Summary Knee effusion is when you have extra fluid in your knee. This can cause pain and swelling and can make it hard to bend and  move your knee. Take over-the-counter and prescription medicines only as told by your doctor. If you have a brace or an immobilizer, wear it as told by your doctor. This information is not intended to replace advice given to you by your health care provider. Make sure you discuss any questions you have with your health care provider. Document Revised: 10/24/2019 Document Reviewed: 10/25/2019 Elsevier Patient Education  2024 Elsevier Inc.     Edwina Barth, MD [facility]

## 2023-01-04 NOTE — Assessment & Plan Note (Signed)
Mild effusion on physical examination No red flag signs or symptoms Recommend to start meloxicam 15 mg daily for 10 days Warm compresses on and off May need follow-up with sports medicine.

## 2023-01-04 NOTE — Patient Instructions (Signed)
Knee Effusion Knee effusion means that you have extra fluid in your knee. This can cause pain and swelling. Your knee may be more difficult to bend and move. Common causes of knee effusion are: Arthritis. Infection. Injury. Autoimmune disease. This means that your body's defense system (immune system) mistakenly attacks healthy body tissues. Follow these instructions at home: If you have a brace or an immobilizer: Wear it as told by your doctor. Take it off only as told by your doctor. Check the skin around it every day. Tell your doctor if you see problems. Loosen the brace or immobilizer if your toes: Tingle. Become numb. Turn cold and blue. Keep the brace or immobilizer clean. If the brace or immobilizer is not waterproof: Do not let it get wet. Cover it with a watertight covering when you take a bath or shower. Managing pain, stiffness, and swelling  If told, put ice on the affected area. To do this: If you have a removable brace or immobilizer, take it off as told by your doctor. Put ice in a plastic bag. Place a towel between your skin and the bag. Leave the ice on for 20 minutes, 2-3 times a day. Take off the ice if your skin turns bright red. This is very important. If you cannot feel pain, heat, or cold, you have a greater risk of damage to the area. Move your toes often. Raise your knee above the level of your heart while you are sitting or lying down. Activity Rest as told by your doctor. Use crutches to support your body weight. Do not use your injured leg to support your body weight until your doctor says that you can. Do exercises as told by your doctor. General instructions Take over-the-counter and prescription medicines only as told by your doctor. Do not smoke or use any products that contain nicotine or tobacco. If you need help quitting, ask your doctor. Wear an elastic bandage or a wrap that puts pressure on your knee (compression wrap) as told by your  doctor. Keep all follow-up visits. Contact a doctor if: You continue to have pain in your knee. You have a fever or chills. Get help right away if: You have swelling or redness in your knee that gets worse or does not get better. You have very bad pain in your knee. Summary Knee effusion is when you have extra fluid in your knee. This can cause pain and swelling and can make it hard to bend and move your knee. Take over-the-counter and prescription medicines only as told by your doctor. If you have a brace or an immobilizer, wear it as told by your doctor. This information is not intended to replace advice given to you by your health care provider. Make sure you discuss any questions you have with your health care provider. Document Revised: 10/24/2019 Document Reviewed: 10/25/2019 Elsevier Patient Education  2024 [employer].

## 2023-01-13 ENCOUNTER — Ambulatory Visit: Payer: 59 | Admitting: Family Medicine

## 2023-01-23 ENCOUNTER — Other Ambulatory Visit: Payer: Self-pay | Admitting: Emergency Medicine

## 2023-01-23 DIAGNOSIS — F419 Anxiety disorder, unspecified: Secondary | ICD-10-CM

## 2023-01-23 DIAGNOSIS — F418 Other specified anxiety disorders: Secondary | ICD-10-CM

## 2023-02-15 ENCOUNTER — Ambulatory Visit (INDEPENDENT_AMBULATORY_CARE_PROVIDER_SITE_OTHER): Payer: 59 | Admitting: Emergency Medicine

## 2023-02-15 ENCOUNTER — Encounter: Payer: Self-pay | Admitting: Emergency Medicine

## 2023-02-15 VITALS — BP 122/88 | HR 99 | Temp 98.5°F | Ht 60.0 in | Wt 139.6 lb

## 2023-02-15 DIAGNOSIS — E785 Hyperlipidemia, unspecified: Secondary | ICD-10-CM | POA: Diagnosis not present

## 2023-02-15 DIAGNOSIS — J069 Acute upper respiratory infection, unspecified: Secondary | ICD-10-CM | POA: Diagnosis not present

## 2023-02-15 DIAGNOSIS — I10 Essential (primary) hypertension: Secondary | ICD-10-CM | POA: Diagnosis not present

## 2023-02-15 DIAGNOSIS — F419 Anxiety disorder, unspecified: Secondary | ICD-10-CM | POA: Diagnosis not present

## 2023-02-15 DIAGNOSIS — B372 Candidiasis of skin and nail: Secondary | ICD-10-CM

## 2023-02-15 MED ORDER — CLOTRIMAZOLE-BETAMETHASONE 1-0.05 % EX CREA
1.0000 | TOPICAL_CREAM | Freq: Every day | CUTANEOUS | 1 refills | Status: AC
Start: 2023-02-15 — End: ?

## 2023-02-15 NOTE — Assessment & Plan Note (Signed)
[place] area.  Recommend Lotrisone cream twice a day for 7 days

## 2023-02-15 NOTE — Patient Instructions (Signed)
Health Maintenance After Age 67 After age 67, you are at a higher risk for certain long-term diseases and infections as well as injuries from falls. Falls are a major cause of broken bones and head injuries in people who are older than age 67. Getting regular preventive care can help to keep you healthy and well. Preventive care includes getting regular testing and making lifestyle changes as recommended by your health care provider. Talk with your health care provider about: Which screenings and tests you should have. A screening is a test that checks for a disease when you have no symptoms. A diet and exercise plan that is right for you. What should I know about screenings and tests to prevent falls? Screening and testing are the best ways to find a health problem early. Early diagnosis and treatment give you the best chance of managing medical conditions that are common after age 67. Certain conditions and lifestyle choices may make you more likely to have a fall. Your health care provider may recommend: Regular vision checks. Poor vision and conditions such as cataracts can make you more likely to have a fall. If you wear glasses, make sure to get your prescription updated if your vision changes. Medicine review. Work with your health care provider to regularly review all of the medicines you are taking, including over-the-counter medicines. Ask your health care provider about any side effects that may make you more likely to have a fall. Tell your health care provider if any medicines that you take make you feel dizzy or sleepy. Strength and balance checks. Your health care provider may recommend certain tests to check your strength and balance while standing, walking, or changing positions. Foot health exam. Foot pain and numbness, as well as not wearing proper footwear, can make you more likely to have a fall. Screenings, including: Osteoporosis screening. Osteoporosis is a condition that causes  the bones to get weaker and break more easily. Blood pressure screening. Blood pressure changes and medicines to control blood pressure can make you feel dizzy. Depression screening. You may be more likely to have a fall if you have a fear of falling, feel depressed, or feel unable to do activities that you used to do. Alcohol use screening. Using too much alcohol can affect your balance and may make you more likely to have a fall. Follow these instructions at home: Lifestyle Do not drink alcohol if: Your health care provider tells you not to drink. If you drink alcohol: Limit how much you have to: 0-1 drink a day for women. 0-2 drinks a day for men. Know how much alcohol is in your drink. In the [place] one drink equals one 12 oz bottle of beer (355 mL), one 5 oz glass of wine (148 mL), or one 1 oz glass of hard liquor (44 mL). Do not use any products that contain nicotine or tobacco. These products include cigarettes, chewing tobacco, and vaping devices, such as e-cigarettes. If you need help quitting, ask your health care provider. Activity  Follow a regular exercise program to stay fit. This will help you maintain your balance. Ask your health care provider what types of exercise are appropriate for you. If you need a cane or walker, use it as recommended by your health care provider. Wear supportive shoes that have nonskid soles. Safety  Remove any tripping hazards, such as rugs, cords, and clutter. Install safety equipment such as grab bars in bathrooms and safety rails on stairs. Keep rooms and walkways   well-lit. General instructions Talk with your health care provider about your risks for falling. Tell your health care provider if: You fall. Be sure to tell your health care provider about all falls, even ones that seem minor. You feel dizzy, tiredness (fatigue), or off-balance. Take over-the-counter and prescription medicines only as told by your health care provider. These include  supplements. Eat a healthy diet and maintain a healthy weight. A healthy diet includes low-fat dairy products, low-fat (lean) meats, and fiber from whole grains, beans, and lots of fruits and vegetables. Stay current with your vaccines. Schedule regular health, dental, and eye exams. Summary Having a healthy lifestyle and getting preventive care can help to protect your health and wellness after age 67. Screening and testing are the best way to find a health problem early and help you avoid having a fall. Early diagnosis and treatment give you the best chance for managing medical conditions that are more common for people who are older than age 67. Falls are a major cause of broken bones and head injuries in people who are older than age 67. Take precautions to prevent a fall at home. Work with your health care provider to learn what changes you can make to improve your health and wellness and to prevent falls. This information is not intended to replace advice given to you by your health care provider. Make sure you discuss any questions you have with your health care provider. Document Revised: 07/15/2020 Document Reviewed: 07/15/2020 Elsevier Patient Education  2024 Elsevier Inc.  

## 2023-02-15 NOTE — Assessment & Plan Note (Signed)
Presently stable. Uses alprazolam 0.5 mg as needed

## 2023-02-15 NOTE — Assessment & Plan Note (Signed)
Well-controlled hypertension Continue lisinopril 10 mg daily Cardiovascular risks associated with hypertension discussed Dietary approaches to stop hypertension discussed

## 2023-02-15 NOTE — Progress Notes (Signed)
Katie Gardner 67 y.o.   Chief Complaint  Patient presents with   Back Pain    Has some back pain patient feels better today    Cough    Cough is getting better as well     HISTORY OF PRESENT ILLNESS: This is a 67 y.o. female developed flulike symptoms last week.  Feeling better today. Had sore throat and cough accompanied by mid back pain but is much better today. Also complaining of itchy rash to groin area.  Likely yeast infection. No other complaints or medical concerns today.  Back Pain Pertinent negatives include no abdominal pain, chest pain, dysuria, fever or headaches.  Cough Associated symptoms include a rash (Groin area). Pertinent negatives include no chest pain, chills, fever, headaches or sore throat.     [name]   Medication Sig Start Date End Date Taking? Authorizing Provider  ALPRAZolam Prudy Feeler) 0.5 MG tablet TAKE 1 TABLET BY MOUTH EVERY DAY AS NEEDED FOR ANXIETY 01/24/23  Yes Tytiana Coles, Eilleen Kempf, MD  clotrimazole-betamethasone (LOTRISONE) cream Apply 1 Application topically daily. 02/15/23  Yes Jervon Ream, Eilleen Kempf, MD  cyclobenzaprine (FLEXERIL) 5 MG tablet TAKE 1 TABLET(5 MG) BY MOUTH THREE TIMES DAILY AS NEEDED FOR MUSCLE SPASMS 09/21/22  Yes Jermanie Minshall, Eilleen Kempf, MD  FLUoxetine (PROZAC) 10 MG tablet Take 1 tablet (10 mg total) by mouth daily. 05/04/22  Yes Elley Harp, Eilleen Kempf, MD  fluticasone [facility]) 50 MCG/ACT nasal spray SHAKE LIQUID AND USE 2 SPRAYS IN EACH NOSTRIL DAILY 06/25/22  Yes Henson, Vickie L, NP-C  lisinopril (ZESTRIL) 10 MG tablet TAKE 1 TABLET(10 MG) BY MOUTH DAILY 11/15/21  Yes Robert Sperl, Eilleen Kempf, MD  loratadine (CLARITIN) 10 MG tablet Take 1 tablet (10 mg total) by mouth daily. 09/12/18  Yes Emmanuel Ercole, Eilleen Kempf, MD  meloxicam (MOBIC) 15 MG tablet Take 1 tablet (15 mg total) by mouth daily. 01/04/23  Yes Georgina Quint, MD  rosuvastatin (CRESTOR) 10 MG tablet TAKE 1 TABLET(10 MG) BY MOUTH DAILY 04/01/21  Yes  Vinnie Bobst, Eilleen Kempf, MD    Allergies  Allergen Reactions   Macrobid [Nitrofurantoin] Nausea And Vomiting   Hydrocodone Nausea And Vomiting    dizzy   Mucinex Dm [Dm-Guaifenesin Er] Swelling    Sob, throat closing    Patient Active Problem List   Diagnosis Date Noted   Acute pain of left shoulder 11/02/2022   Suspected sleep apnea 06/09/2022   Urinary incontinence without sensory awareness 03/05/2022   Pain and swelling of left knee 02/17/2022   Stress incontinence in female 02/17/2022   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   Dyspareunia in female 09/13/2019   Skin tags, multiple acquired 07/27/2019   Chronic insomnia 05/31/2019   Essential hypertension 12/17/2017   Seasonal allergies 12/17/2017   Anxiety and depression 11/13/2016   Chronic anxiety 11/13/2016   Hot flashes    Situational anxiety    Dyslipidemia     Past Medical History:  Diagnosis Date   Allergy    seasonal   Anxiety    Depression    Family history of adverse reaction to anesthesia    sister hard to wake up   Hematuria    Hot flashes    Hx of cardiovascular stress test    Lex MV 12/13:  EF 68%, mild apical thinning, no ischemia   Hyperlipidemia    Hypertension    PONV (postoperative nausea and vomiting)     Past Surgical History:  Procedure Laterality Date   BREAST LUMPECTOMY  right breast, not cancer   Carpel tunnel     ECTOPIC PREGNANCY SURGERY      Social History   Socioeconomic History   Marital status: Divorced    Spouse name: Not on file   Number of children: 1   Years of education: 8th grade   Highest education level: Not on file  Occupational History    Employer: UNEMPLOYED  Tobacco Use   Smoking status: Former    Types: Cigarettes   Smokeless tobacco: Never  Vaping Use   Vaping status: Never Used  Substance and Sexual Activity   Alcohol use: Yes    Comment: Occasional   Drug use: No   Sexual activity: Yes  Other Topics Concern   Not on file  Social  History Narrative   Caffiene coffee 1 cup   Live alone.   Work" retired   Chemical engineer Strain: Low Risk  (06/03/2022)   Overall Financial Resource Strain (CARDIA)    Difficulty of Paying Living Expenses: Not hard at all  Food Insecurity: No Food Insecurity (06/03/2022)   Hunger Vital Sign    Worried About Running Out of Food in the Last Year: Never true    Ran Out of Food in the Last Year: Never true  Transportation Needs: No Transportation Needs (06/03/2022)   PRAPARE - Administrator, Civil Service (Medical): No    Lack of Transportation (Non-Medical): No  Physical Activity: Inactive (06/03/2022)   Exercise Vital Sign    Days of Exercise per Week: 0 days    Minutes of Exercise per Session: 0 min  Stress: No Stress Concern Present (06/03/2022)   [employer] of Occupational Health - Occupational Stress Questionnaire    Feeling of Stress : Not at all  Social Connections: Socially Isolated (06/03/2022)   Social Connection and Isolation Panel [NHANES]    Frequency of Communication with Friends and Family: Once a week    Frequency of Social Gatherings with Friends and Family: Once a week    Attends Religious Services: Never    Database administrator or Organizations: No    Attends Banker Meetings: Never    Marital Status: Never married  Intimate Partner Violence: Not At Risk (06/03/2022)   Humiliation, Afraid, Rape, and Kick questionnaire    Fear of Current or Ex-Partner: No    Emotionally Abused: No    Physically Abused: No    Sexually Abused: No    Family History  Problem Relation Age of Onset   Heart disease Mother    Diabetes Mother    Esophageal cancer Father        48's   Diabetes Sister    Diabetes Sister    Colon cancer Neg Hx      Review of Systems  Constitutional: Negative.  Negative for chills and fever.  HENT: Negative.  Negative for congestion and sore throat.   Respiratory:  Positive for  cough.   Cardiovascular: Negative.  Negative for chest pain and palpitations.  Gastrointestinal:  Negative for abdominal pain, diarrhea, nausea and vomiting.  Genitourinary: Negative.  Negative for dysuria and hematuria.  Musculoskeletal:  Positive for back pain.  Skin:  Positive for rash (Groin area).  Neurological: Negative.  Negative for dizziness and headaches.  All other systems reviewed and are negative.   Vitals:   02/15/23 1543  BP: 122/88  Pulse: 99  Temp: 98.5 F (36.9 C)  SpO2: 96%    Physical  Exam Vitals reviewed.  Constitutional:      Appearance: Normal appearance.  HENT:     Head: Normocephalic.     Mouth/Throat:     Mouth: Mucous membranes are moist.     Pharynx: Oropharynx is clear.  Eyes:     Extraocular Movements: Extraocular movements intact.     Pupils: Pupils are equal, round, and reactive to light.  Cardiovascular:     Rate and Rhythm: Normal rate and regular rhythm.     Pulses: Normal pulses.     Heart sounds: Normal heart sounds.  Pulmonary:     Effort: Pulmonary effort is normal.     Breath sounds: Normal breath sounds.  Musculoskeletal:     Cervical back: No tenderness.  Lymphadenopathy:     Cervical: No cervical adenopathy.  Skin:    General: Skin is warm and dry.     Capillary Refill: Capillary refill takes less than 2 seconds.  Neurological:     General: No focal deficit present.     Mental Status: She is alert and oriented to person, place, and time.  Psychiatric:        Mood and Affect: Mood normal.        Behavior: Behavior normal.      ASSESSMENT & PLAN: A total of 33 minutes was spent with the patient and counseling/coordination of care regarding preparing for this visit, review of most recent office visit notes, review of multiple chronic medical conditions and their management, review of all medications, review of most recent bloodwork results, review of health maintenance items, education on nutrition, prognosis,  documentation, and need for follow up.   Problem List Items Addressed This Visit       Cardiovascular and Mediastinum   Essential hypertension    Well-controlled hypertension Continue lisinopril 10 mg daily Cardiovascular risks associated with hypertension discussed Dietary approaches to stop hypertension discussed        Respiratory   Viral upper respiratory illness - Primary    Much improved and running its course without complications. Symptom management discussed. Advised to stay well-hydrated and contact the office if no better or worse during the next several days      Relevant Medications   clotrimazole-betamethasone (LOTRISONE) cream     Musculoskeletal and Integument   Yeast dermatitis    Ingrowing area.  Recommend Lotrisone cream twice a day for 7 days      Relevant Medications   clotrimazole-betamethasone (LOTRISONE) cream     Other   Dyslipidemia    Chronic stable condition Continue rosuvastatin 10 mg daily Diet and nutrition discussed      Chronic anxiety    Presently stable. Uses alprazolam 0.5 mg as needed      Patient Instructions  Health Maintenance After Age 53 After age 71, you are at a higher risk for certain long-term diseases and infections as well as injuries from falls. Falls are a major cause of broken bones and head injuries in people who are older than age 22. Getting regular preventive care can help to keep you healthy and well. Preventive care includes getting regular testing and making lifestyle changes as recommended by your health care provider. Talk with your health care provider about: Which screenings and tests you should have. A screening is a test that checks for a disease when you have no symptoms. A diet and exercise plan that is right for you. What should I know about screenings and tests to prevent falls? Screening and testing are the  best ways to find a health problem early. Early diagnosis and treatment give you the best  chance of managing medical conditions that are common after age 73. Certain conditions and lifestyle choices may make you more likely to have a fall. Your health care provider may recommend: Regular vision checks. Poor vision and conditions such as cataracts can make you more likely to have a fall. If you wear glasses, make sure to get your prescription updated if your vision changes. Medicine review. Work with your health care provider to regularly review all of the medicines you are taking, including over-the-counter medicines. Ask your health care provider about any side effects that may make you more likely to have a fall. Tell your health care provider if any medicines that you take make you feel dizzy or sleepy. Strength and balance checks. Your health care provider may recommend certain tests to check your strength and balance while standing, walking, or changing positions. Foot health exam. Foot pain and numbness, as well as not wearing proper footwear, can make you more likely to have a fall. Screenings, including: Osteoporosis screening. Osteoporosis is a condition that causes the bones to get weaker and break more easily. Blood pressure screening. Blood pressure changes and medicines to control blood pressure can make you feel dizzy. Depression screening. You may be more likely to have a fall if you have a fear of falling, feel depressed, or feel unable to do activities that you used to do. Alcohol use screening. Using too much alcohol can affect your balance and may make you more likely to have a fall. Follow these instructions at home: Lifestyle Do not drink alcohol if: Your health care provider tells you not to drink. If you drink alcohol: Limit how much you have to: 0-1 drink a day for women. 0-2 drinks a day for men. Know how much alcohol is in your drink. In the [place] one drink equals one 12 oz bottle of beer (355 mL), one 5 oz glass of wine (148 mL), or one 1 oz glass of hard  liquor (44 mL). Do not use any products that contain nicotine or tobacco. These products include cigarettes, chewing tobacco, and vaping devices, such as e-cigarettes. If you need help quitting, ask your health care provider. Activity  Follow a regular exercise program to stay fit. This will help you maintain your balance. Ask your health care provider what types of exercise are appropriate for you. If you need a cane or walker, use it as recommended by your health care provider. Wear supportive shoes that have nonskid soles. Safety  Remove any tripping hazards, such as rugs, cords, and clutter. Install safety equipment such as grab bars in bathrooms and safety rails on stairs. Keep rooms and walkways well-lit. General instructions Talk with your health care provider about your risks for falling. Tell your health care provider if: You fall. Be sure to tell your health care provider about all falls, even ones that seem minor. You feel dizzy, tiredness (fatigue), or off-balance. Take over-the-counter and prescription medicines only as told by your health care provider. These include supplements. Eat a healthy diet and maintain a healthy weight. A healthy diet includes low-fat dairy products, low-fat (lean) meats, and fiber from whole grains, beans, and lots of fruits and vegetables. Stay current with your vaccines. Schedule regular health, dental, and eye exams. Summary Having a healthy lifestyle and getting preventive care can help to protect your health and wellness after age 51. Screening and testing are  the best way to find a health problem early and help you avoid having a fall. Early diagnosis and treatment give you the best chance for managing medical conditions that are more common for people who are older than age 54. Falls are a major cause of broken bones and head injuries in people who are older than age 73. Take precautions to prevent a fall at home. Work with your health care  provider to learn what changes you can make to improve your health and wellness and to prevent falls. This information is not intended to replace advice given to you by your health care provider. Make sure you discuss any questions you have with your health care provider. Document Revised: 07/15/2020 Document Reviewed: 07/15/2020 Elsevier Patient Education  2024 Elsevier Inc.      Edwina Barth, MD [facility]

## 2023-02-15 NOTE — Assessment & Plan Note (Signed)
Chronic stable condition.  Continue rosuvastatin 10 mg daily. Diet and nutrition discussed. 

## 2023-02-15 NOTE — Assessment & Plan Note (Signed)
Much improved and running its course without complications. Symptom management discussed. Advised to stay well-hydrated and contact the office if no better or worse during the next several days

## 2023-03-08 ENCOUNTER — Encounter: Payer: Self-pay | Admitting: Emergency Medicine

## 2023-03-09 DIAGNOSIS — H25013 Cortical age-related cataract, bilateral: Secondary | ICD-10-CM | POA: Diagnosis not present

## 2023-03-29 DIAGNOSIS — H2511 Age-related nuclear cataract, right eye: Secondary | ICD-10-CM | POA: Diagnosis not present

## 2023-03-29 DIAGNOSIS — H2513 Age-related nuclear cataract, bilateral: Secondary | ICD-10-CM | POA: Diagnosis not present

## 2023-03-29 DIAGNOSIS — H40013 Open angle with borderline findings, low risk, bilateral: Secondary | ICD-10-CM | POA: Diagnosis not present

## 2023-03-31 ENCOUNTER — Other Ambulatory Visit: Payer: Self-pay | Admitting: Emergency Medicine

## 2023-03-31 DIAGNOSIS — F418 Other specified anxiety disorders: Secondary | ICD-10-CM

## 2023-03-31 DIAGNOSIS — F32A Depression, unspecified: Secondary | ICD-10-CM

## 2023-04-09 DIAGNOSIS — H40013 Open angle with borderline findings, low risk, bilateral: Secondary | ICD-10-CM | POA: Diagnosis not present

## 2023-04-09 DIAGNOSIS — H2513 Age-related nuclear cataract, bilateral: Secondary | ICD-10-CM | POA: Diagnosis not present

## 2023-04-09 DIAGNOSIS — H33012 Retinal detachment with single break, left eye: Secondary | ICD-10-CM | POA: Diagnosis not present

## 2023-04-09 DIAGNOSIS — H33002 Unspecified retinal detachment with retinal break, left eye: Secondary | ICD-10-CM | POA: Diagnosis not present

## 2023-04-09 DIAGNOSIS — H43813 Vitreous degeneration, bilateral: Secondary | ICD-10-CM | POA: Diagnosis not present

## 2023-04-13 DIAGNOSIS — H33012 Retinal detachment with single break, left eye: Secondary | ICD-10-CM | POA: Diagnosis not present

## 2023-04-21 DIAGNOSIS — H33012 Retinal detachment with single break, left eye: Secondary | ICD-10-CM | POA: Diagnosis not present

## 2023-05-21 DIAGNOSIS — H33012 Retinal detachment with single break, left eye: Secondary | ICD-10-CM | POA: Diagnosis not present

## 2023-05-26 ENCOUNTER — Other Ambulatory Visit: Payer: Self-pay | Admitting: Emergency Medicine

## 2023-05-26 DIAGNOSIS — F419 Anxiety disorder, unspecified: Secondary | ICD-10-CM

## 2023-05-26 DIAGNOSIS — F418 Other specified anxiety disorders: Secondary | ICD-10-CM

## 2023-05-26 DIAGNOSIS — M25519 Pain in unspecified shoulder: Secondary | ICD-10-CM

## 2023-06-07 ENCOUNTER — Ambulatory Visit (INDEPENDENT_AMBULATORY_CARE_PROVIDER_SITE_OTHER): Payer: 59

## 2023-06-07 VITALS — Ht 61.0 in | Wt 142.0 lb

## 2023-06-07 DIAGNOSIS — Z0000 Encounter for general adult medical examination without abnormal findings: Secondary | ICD-10-CM | POA: Diagnosis not present

## 2023-06-07 DIAGNOSIS — Z780 Asymptomatic menopausal state: Secondary | ICD-10-CM

## 2023-06-07 NOTE — Patient Instructions (Addendum)
 Katie Gardner , Thank you for taking time to come for your Medicare Wellness Visit. I appreciate your ongoing commitment to your health goals. Please review the following plan we discussed and let me know if I can assist you in the future.   Referrals/Orders/Follow-Ups/Clinician Recommendations: Aim for 30 minutes of exercise or brisk walking, 6-8 glasses of water, and 5 servings of fruits and vegetables each day.   This is a list of the screening recommended for you and due dates:  Health Maintenance  Topic Date Due   Flu Shot  06/07/2023*   Pneumonia Vaccine (1 of 1 - PCV) 01/04/2024*   DEXA scan (bone density measurement)  01/04/2024*   Mammogram  03/12/2024   Medicare Annual Wellness Visit  06/06/2024   Hepatitis C Screening  Completed   HPV Vaccine  Aged Out   DTaP/Tdap/Td vaccine  Discontinued   Colon Cancer Screening  Discontinued   COVID-19 Vaccine  Discontinued   Zoster (Shingles) Vaccine  Discontinued  *Topic was postponed. The date shown is not the original due date.    Advanced directives: (Declined) Advance directive discussed with you today. Even though you declined this today, please call our office should you change your mind, and we can give you the proper paperwork for you to fill out.  Next Medicare Annual Wellness Visit scheduled for next year: Yes

## 2023-06-07 NOTE — Progress Notes (Signed)
 Subjective:   Katie Gardner is a 68 y.o. who presents for a Medicare Wellness preventive visit.  Visit Complete: Virtual I connected with  Katie Gardner on 06/07/23 by a audio enabled telemedicine application and verified that I am speaking with the correct person using two identifiers.  Patient Location: Home  Provider Location: Office/Clinic  I discussed the limitations of evaluation and management by telemedicine. The patient expressed understanding and agreed to proceed.  Vital Signs: Because this visit was a virtual/telehealth visit, some criteria may be missing or patient reported. Any vitals not documented were not able to be obtained and vitals that have been documented are patient reported.  VideoDeclined- This patient declined Librarian, academic. Therefore the visit was completed with audio only.  Persons Participating in Visit: Patient.  AWV Questionnaire: No: Patient Medicare AWV questionnaire was not completed prior to this visit.  Cardiac Risk Factors include: advanced age (>81men, >33 women);hypertension;dyslipidemia     Objective:    Today's Vitals   06/07/23 1313  Weight: 142 lb (64.4 kg)  Height: 5\' 1"  (1.549 m)   Body mass index is 26.83 kg/m.     06/07/2023    1:12 PM 06/03/2022    1:07 PM 12/26/2020    2:37 PM 04/12/2017    2:57 PM  Advanced Directives  Does Patient Have a Medical Advance Directive? No No No No  Would patient like information on creating a medical advance directive? No - Patient declined No - Patient declined No - Patient declined Yes (MAU/Ambulatory/Procedural Areas - Information given)    Current Medications (verified) Outpatient Encounter Medications as of 06/07/2023  Medication Sig   ALPRAZolam (XANAX) 0.5 MG tablet TAKE 1 TABLET BY MOUTH EVERY DAY AS NEEDED FOR ANXIETY   clotrimazole-betamethasone (LOTRISONE) cream Apply 1 Application topically daily.   cyclobenzaprine (FLEXERIL) 5 MG tablet TAKE 1  TABLET(5 MG) BY MOUTH THREE TIMES DAILY AS NEEDED FOR MUSCLE SPASMS   FLUoxetine (PROZAC) 10 MG tablet Take 1 tablet (10 mg total) by mouth daily.   fluticasone (FLONASE) 50 MCG/ACT nasal spray SHAKE LIQUID AND USE 2 SPRAYS IN EACH NOSTRIL DAILY   ibuprofen (ADVIL) 600 MG tablet TAKE 1 TABLET(600 MG) BY MOUTH EVERY 8 HOURS AS NEEDED FOR MODERATE PAIN   lisinopril (ZESTRIL) 10 MG tablet TAKE 1 TABLET(10 MG) BY MOUTH DAILY   loratadine (CLARITIN) 10 MG tablet Take 1 tablet (10 mg total) by mouth daily.   rosuvastatin (CRESTOR) 10 MG tablet TAKE 1 TABLET(10 MG) BY MOUTH DAILY   No facility-administered encounter medications on file as of 06/07/2023.    Allergies (verified) Macrobid [nitrofurantoin], Hydrocodone, and Mucinex dm [dm-guaifenesin er]   History: Past Medical History:  Diagnosis Date   Allergy    seasonal   Anxiety    Depression    Family history of adverse reaction to anesthesia    sister hard to wake up   Hematuria    Hot flashes    Hx of cardiovascular stress test    Lex MV 12/13:  EF 68%, mild apical thinning, no ischemia   Hyperlipidemia    Hypertension    PONV (postoperative nausea and vomiting)    Past Surgical History:  Procedure Laterality Date   BREAST LUMPECTOMY     right breast, not cancer   Carpel tunnel     ECTOPIC PREGNANCY SURGERY     Family History  Problem Relation Age of Onset   Heart disease Mother    Diabetes Mother  Esophageal cancer Father        81's   Diabetes Sister    Diabetes Sister    Colon cancer Neg Hx    Social History   Socioeconomic History   Marital status: Divorced    Spouse name: Not on file   Number of children: 1   Years of education: 8th grade   Highest education level: Not on file  Occupational History    Employer: UNEMPLOYED  Tobacco Use   Smoking status: Former    Current packs/day: 0.00    Average packs/day: 1 pack/day for 6.0 years (6.0 ttl pk-yrs)    Types: Cigarettes    Start date: 03/09/1974    Quit  date: 03/09/1980    Years since quitting: 43.2    Passive exposure: Past   Smokeless tobacco: Never  Vaping Use   Vaping status: Never Used  Substance and Sexual Activity   Alcohol use: Not Currently   Drug use: No   Sexual activity: Yes  Other Topics Concern   Not on file  Social History Narrative   Caffiene coffee 1 cup   Live alone.   Work" retired   Teacher, early years/pre Strain: Low Risk  (06/07/2023)   Overall Financial Resource Strain (CARDIA)    Difficulty of Paying Living Expenses: Not hard at all  Food Insecurity: No Food Insecurity (06/07/2023)   Hunger Vital Sign    Worried About Running Out of Food in the Last Year: Never true    Ran Out of Food in the Last Year: Never true  Transportation Needs: No Transportation Needs (06/07/2023)   PRAPARE - Administrator, Civil Service (Medical): No    Lack of Transportation (Non-Medical): No  Physical Activity: Inactive (06/07/2023)   Exercise Vital Sign    Days of Exercise per Week: 0 days    Minutes of Exercise per Session: 0 min  Stress: Stress Concern Present (06/07/2023)   [employer] of Occupational Health - Occupational Stress Questionnaire    Feeling of Stress : Rather much  Social Connections: Socially Isolated (06/07/2023)   Social Connection and Isolation Panel [NHANES]    Frequency of Communication with Friends and Family: More than three times a week    Frequency of Social Gatherings with Friends and Family: Never    Attends Religious Services: Never    Database administrator or Organizations: No    Attends Engineer, structural: Never    Marital Status: Divorced    Tobacco Counseling Counseling given: No    Clinical Intake:  Pre-visit preparation completed: Yes  Pain : No/denies pain     BMI - recorded: 26.83 Nutritional Risks: None Diabetes: No  Lab Results  Component Value Date   HGBA1C 5.7 12/09/2022   HGBA1C 5.8 06/09/2022   HGBA1C 5.8  02/17/2022     How often do you need to have someone help you when you read instructions, pamphlets, or other written materials from your doctor or pharmacy?: 1 - Never  Interpreter Needed?: No  Information entered by :: Hassell Halim, CMA   Activities of Daily Living     06/07/2023    1:17 PM  In your present state of health, do you have any difficulty performing the following activities:  Hearing? 0  Vision? 0  Difficulty concentrating or making decisions? 0  Walking or climbing stairs? 0  Dressing or bathing? 0  Doing errands, shopping? 0  Preparing Food and eating ? N  Using the Toilet? N  In the past six months, have you accidently leaked urine? Y  Comment wears a pad  Do you have problems with loss of bowel control? N  Managing your Medications? N  Managing your Finances? N  Housekeeping or managing your Housekeeping? N    Patient Care Team: Georgina Quint, MD as PCP - General (Internal Medicine) Stephannie Li, MD as Consulting Physician (Ophthalmology)  Indicate any recent Medical Services you may have received from other than [facility] providers in the past year (date may be approximate).     Assessment:   This is a routine wellness examination for Charity.  Hearing/Vision screen Hearing Screening - Comments:: Denies hearing difficulties   Vision Screening - Comments:: Eye surgery is planned (cataract) - sees Dr Stephannie Li   Goals Addressed               This Visit's Progress     Patient Stated (pt-stated)        Patient stated that she wants to be able to exercise due to left knee discomfort.       Depression Screen     06/07/2023    1:21 PM 06/07/2023    1:20 PM 02/15/2023    3:46 PM 01/04/2023    3:45 PM 12/09/2022    2:40 PM 11/02/2022    2:05 PM 11/02/2022    2:04 PM  PHQ 2/9 Scores  PHQ - 2 Score 2 0 0 0 0 0 0  PHQ- 9 Score 5 0    0     Fall Risk     06/07/2023    1:18 PM 02/15/2023    3:46 PM 01/04/2023    3:45 PM 12/09/2022     2:39 PM 11/02/2022    2:04 PM  Fall Risk   Falls in the past year? 0 0 0 0 0  Number falls in past yr: 0 0 0 0 0  Injury with Fall? 0 0 0 0 0  Risk for fall due to : No Fall Risks No Fall Risks No Fall Risks No Fall Risks No Fall Risks  Follow up Falls prevention discussed;Falls evaluation completed Falls evaluation completed Falls evaluation completed Falls evaluation completed Falls evaluation completed    MEDICARE RISK AT HOME:  Medicare Risk at Home Any stairs in or around the home?: No If so, are there any without handrails?: No Home free of loose throw rugs in walkways, pet beds, electrical cords, etc?: Yes Adequate lighting in your home to reduce risk of falls?: Yes Life alert?: No Use of a cane, walker or w/c?: No Grab bars in the bathroom?: No Shower chair or bench in shower?: No Elevated toilet seat or a handicapped toilet?: No  TIMED UP AND GO:  Was the test performed?  No  Cognitive Function: 6CIT completed        06/07/2023    1:24 PM 06/03/2022    1:18 PM 04/12/2017    3:08 PM  6CIT Screen  What Year? 0 points 0 points 0 points  What month? 0 points 0 points 0 points  What time? 0 points 0 points 0 points  Count back from 20 0 points 0 points 0 points  Months in reverse 0 points 0 points 0 points  Repeat phrase 0 points 0 points 2 points  Total Score 0 points 0 points 2 points    Immunizations Immunization History  Administered Date(s) Administered   DT (Pediatric) 03/09/2008   Influenza,inj,Quad PF,6+ Mos  06/06/2018, 03/15/2019   Influenza-Unspecified 11/25/2011    Screening Tests Health Maintenance  Topic Date Due   INFLUENZA VACCINE  06/07/2023 (Originally 10/08/2022)   Pneumonia Vaccine 42+ Years old (1 of 1 - PCV) 01/04/2024 (Originally 11/29/2020)   DEXA SCAN  01/04/2024 (Originally 11/29/2020)   MAMMOGRAM  03/12/2024   Medicare Annual Wellness (AWV)  06/06/2024   Hepatitis C Screening  Completed   HPV VACCINES  Aged Out   DTaP/Tdap/Td   Discontinued   Colonoscopy  Discontinued   COVID-19 Vaccine  Discontinued   Zoster Vaccines- Shingrix  Discontinued    Health Maintenance  There are no preventive care reminders to display for this patient. Health Maintenance Items Addressed:06/07/2023 DEXA ordered today.  Pt declines to get the Pneumonia vaccine.  Additional Screening:  Vision Screening: Recommended annual ophthalmology exams for early detection of glaucoma and other disorders of the eye.  Dental Screening: Recommended annual dental exams for proper oral hygiene  Community Resource Referral / Chronic Care Management: CRR required this visit?  No   CCM required this visit?  No     Plan:     I have personally reviewed and noted the following in the patient's chart:   Medical and social history Use of alcohol, tobacco or illicit drugs  Current medications and supplements including opioid prescriptions. Patient is not currently taking opioid prescriptions. Functional ability and status Nutritional status Physical activity Advanced directives List of other physicians Hospitalizations, surgeries, and ER visits in previous 12 months Vitals Screenings to include cognitive, depression, and falls Referrals and appointments  In addition, I have reviewed and discussed with patient certain preventive protocols, quality metrics, and best practice recommendations. A written personalized care plan for preventive services as well as general preventive health recommendations were provided to patient.     Darreld Mclean, CMA   06/07/2023   After Visit Summary: (MyChart) Due to this being a telephonic visit, the after visit summary with patients personalized plan was offered to patient via MyChart   Notes: Please refer to Routing Comments.

## 2023-06-14 ENCOUNTER — Other Ambulatory Visit: Payer: Self-pay | Admitting: Emergency Medicine

## 2023-06-14 DIAGNOSIS — M25512 Pain in left shoulder: Secondary | ICD-10-CM

## 2023-06-19 DIAGNOSIS — H1089 Other conjunctivitis: Secondary | ICD-10-CM | POA: Diagnosis not present

## 2023-06-23 ENCOUNTER — Ambulatory Visit (INDEPENDENT_AMBULATORY_CARE_PROVIDER_SITE_OTHER): Admitting: Emergency Medicine

## 2023-06-23 ENCOUNTER — Encounter: Payer: Self-pay | Admitting: Emergency Medicine

## 2023-06-23 ENCOUNTER — Ambulatory Visit: Admitting: Emergency Medicine

## 2023-06-23 VITALS — BP 148/80 | HR 68 | Temp 98.3°F | Resp 16 | Ht 60.0 in | Wt 144.8 lb

## 2023-06-23 DIAGNOSIS — E785 Hyperlipidemia, unspecified: Secondary | ICD-10-CM | POA: Diagnosis not present

## 2023-06-23 DIAGNOSIS — F419 Anxiety disorder, unspecified: Secondary | ICD-10-CM | POA: Diagnosis not present

## 2023-06-23 DIAGNOSIS — I10 Essential (primary) hypertension: Secondary | ICD-10-CM | POA: Diagnosis not present

## 2023-06-23 DIAGNOSIS — H1013 Acute atopic conjunctivitis, bilateral: Secondary | ICD-10-CM | POA: Insufficient documentation

## 2023-06-23 DIAGNOSIS — F32A Depression, unspecified: Secondary | ICD-10-CM

## 2023-06-23 MED ORDER — OLOPATADINE HCL 0.2 % OP SOLN
2.0000 [drp] | Freq: Four times a day (QID) | OPHTHALMIC | 1 refills | Status: AC | PRN
Start: 2023-06-23 — End: ?

## 2023-06-23 NOTE — Patient Instructions (Signed)
Allergic Conjunctivitis, Adult  Allergic conjunctivitis is inflammation of the clear membrane that covers the white part of your eye and the inner surface of your eyelid. This area is called the conjunctiva. This condition can make your eye red or pink. It can also make your eye feel itchy. This condition is not contagious. This means that it cannot be spread from one person to another person. What are the causes? This condition is caused by allergens. These are things that can cause an allergic reaction in some people. Common allergens include: Outdoor allergens, such as: Pollen, including pollen from grass and weeds. Mold. Car fumes. Indoor allergens, such as: Dust. Smoke. Mold. Proteins in a pet's pee (urine), saliva, or dander. Proteins that build up in contact lenses. What increases the risk? You are more likely to develop this condition if you have a family history of these things: Allergies. Conditions that you get because of allergens, such as asthma or inflammation of the skin (eczema). What are the signs or symptoms? Symptoms of this condition include eyes that are: Itchy. Red. Watery. Puffy. Your eyes may also: Sting or burn. Have clear fluid draining from them. Have thick mucus coming from them. This happens in severe cases. How is this treated? Treatment for this condition may include: Using cold, wet cloths (cold compresses) to soothe itching and swelling. Washing the face, hair, and clothing to remove allergens. Using eye drops. These may include: Eye drops that block allergies. Eye drops that reduce swelling and irritation. Steroid eye drops if other treatments have not worked. Oral antihistamine medicines. These medicines lessen your allergies. You may need these if eye drops do not help or are difficult to use. Air purifier at home and work. Wrap around sunglasses. This may help to block allergens from reaching the eye. Not wearing contact lenses, if the  doctor has found that contact lenses caused your symptoms. Use daily wear disposal contact lenses instead. Follow these instructions at home: Eye care Place a cool, clean washcloth on your eye for 10-20 minutes. Do this 3-4 times a day. Do not touch or rub your eyes. Do not wear contact lenses until the inflammation is gone. Wear glasses instead. Do not wear eye makeup until the inflammation is gone. General instructions Try not to be around things that you are allergic to. Take or apply over-the-counter and prescription medicines only as told by your doctor. These include any eye drops. Drink enough fluid to keep your pee pale yellow. Keep all follow-up visits. Contact a doctor if: Your symptoms get worse. Your symptoms do not get better with treatment. You have mild eye pain. You are sensitive to light. You have spots or blisters on your eyes. You have pus coming from your eye. You have a fever. Get help right away if: You have redness, swelling, or other symptoms in only one eye. You cannot see well. You have other vision changes. You have very bad eye pain. Summary Allergic conjunctivitis is caused by allergens. It can make your eye red or pink, and it can make your eye feel itchy. This condition cannot be spread from one person to another person. Avoid things that you are allergic to. Take or apply over-the-counter and prescription medicines only as told by your doctor. These include any eye drops. Contact your doctor if your symptoms get worse or they do not get better with treatment. This information is not intended to replace advice given to you by your health care provider. Make sure you  discuss any questions you have with your health care provider. Document Revised: 05/02/2021 Document Reviewed: 05/02/2021 Elsevier Patient Education  2024 [employer].

## 2023-06-23 NOTE — Progress Notes (Signed)
 Katie Gardner 68 y.o.   Chief Complaint  Patient presents with   Eye Problem    Eyes are itching a lot. Not sure if it's from pollen    HISTORY OF PRESENT ILLNESS: This is a 68 y.o. female complaining of watery itchy eyes intermittent for the last several days No purulent discharge.  No visual problems.  No other associated symptoms. No complaints or medical concerns today.  Eye Problem  Associated symptoms include eye redness. Pertinent negatives include no blurred vision, double vision, fever, nausea or vomiting.     [name]   Medication Sig Start Date End Date Taking? Authorizing Provider  ALPRAZolam Prudy Feeler) 0.5 MG tablet TAKE 1 TABLET BY MOUTH EVERY DAY AS NEEDED FOR ANXIETY 05/27/23  Yes Averill Winters, Eilleen Kempf, MD  clotrimazole-betamethasone (LOTRISONE) cream Apply 1 Application topically daily. 02/15/23  Yes Cedrick Partain, Eilleen Kempf, MD  cyclobenzaprine (FLEXERIL) 5 MG tablet TAKE 1 TABLET(5 MG) BY MOUTH THREE TIMES DAILY AS NEEDED FOR MUSCLE SPASMS 09/21/22  Yes Dene Nazir, Eilleen Kempf, MD  FLUoxetine (PROZAC) 10 MG tablet Take 1 tablet (10 mg total) by mouth daily. 05/04/22  Yes Jamarria Real, Eilleen Kempf, MD  fluticasone [facility]) 50 MCG/ACT nasal spray SHAKE LIQUID AND USE 2 SPRAYS IN EACH NOSTRIL DAILY 06/25/22  Yes Henson, Vickie L, NP-C  ibuprofen (ADVIL) 600 MG tablet TAKE 1 TABLET(600 MG) BY MOUTH EVERY 8 HOURS AS NEEDED FOR MODERATE PAIN 05/27/23  Yes Georgina Quint, MD  lisinopril (ZESTRIL) 10 MG tablet TAKE 1 TABLET(10 MG) BY MOUTH DAILY 11/15/21  Yes Izan Miron, Eilleen Kempf, MD  loratadine (CLARITIN) 10 MG tablet Take 1 tablet (10 mg total) by mouth daily. 09/12/18  Yes Brookelin Felber, Eilleen Kempf, MD  meloxicam (MOBIC) 15 MG tablet TAKE 1 TABLET(15 MG) BY MOUTH DAILY FOR 10 DAYS 06/14/23  Yes Keishia Ground, Eilleen Kempf, MD  Olopatadine HCl 0.2 % SOLN Apply 2 drops to eye every 6 (six) hours as needed. 06/23/23  Yes Georgina Quint, MD  rosuvastatin (CRESTOR) 10 MG  tablet TAKE 1 TABLET(10 MG) BY MOUTH DAILY 04/01/21  Yes Georgina Quint, MD    Allergies  Allergen Reactions   Macrobid [Nitrofurantoin] Nausea And Vomiting   Hydrocodone Nausea And Vomiting    dizzy   Mucinex Dm [Dm-Guaifenesin Er] Swelling    Sob, throat closing    Patient Active Problem List   Diagnosis Date Noted   Viral upper respiratory illness 02/15/2023   Yeast dermatitis 02/15/2023   Suspected sleep apnea 06/09/2022   Urinary incontinence without sensory awareness 03/05/2022   Stress incontinence in female 02/17/2022   Dyspareunia in female 09/13/2019   Skin tags, multiple acquired 07/27/2019   Chronic insomnia 05/31/2019   Essential hypertension 12/17/2017   Seasonal allergies 12/17/2017   Anxiety and depression 11/13/2016   Chronic anxiety 11/13/2016   Hot flashes    Situational anxiety    Dyslipidemia     Past Medical History:  Diagnosis Date   Allergy    seasonal   Anxiety    Depression    Family history of adverse reaction to anesthesia    sister hard to wake up   Hematuria    Hot flashes    Hx of cardiovascular stress test    Lex MV 12/13:  EF 68%, mild apical thinning, no ischemia   Hyperlipidemia    Hypertension    PONV (postoperative nausea and vomiting)     Past Surgical History:  Procedure Laterality Date   BREAST LUMPECTOMY  right breast, not cancer   Carpel tunnel     ECTOPIC PREGNANCY SURGERY      Social History   Socioeconomic History   Marital status: Divorced    Spouse name: Not on file   Number of children: 1   Years of education: 8th grade   Highest education level: Not on file  Occupational History    Employer: UNEMPLOYED  Tobacco Use   Smoking status: Former    Current packs/day: 0.00    Average packs/day: 1 pack/day for 6.0 years (6.0 ttl pk-yrs)    Types: Cigarettes    Start date: 03/09/1974    Quit date: 03/09/1980    Years since quitting: 43.3    Passive exposure: Past   Smokeless tobacco: Never  Vaping  Use   Vaping status: Never Used  Substance and Sexual Activity   Alcohol use: Not Currently   Drug use: No   Sexual activity: Yes  Other Topics Concern   Not on file  Social History Narrative   Caffiene coffee 1 cup   Live alone.   Work" retired   Teacher, early years/pre Strain: Low Risk  (06/07/2023)   Overall Financial Resource Strain (CARDIA)    Difficulty of Paying Living Expenses: Not hard at all  Food Insecurity: No Food Insecurity (06/07/2023)   Hunger Vital Sign    Worried About Running Out of Food in the Last Year: Never true    Ran Out of Food in the Last Year: Never true  Transportation Needs: No Transportation Needs (06/07/2023)   PRAPARE - Administrator, Civil Service (Medical): No    Lack of Transportation (Non-Medical): No  Physical Activity: Inactive (06/07/2023)   Exercise Vital Sign    Days of Exercise per Week: 0 days    Minutes of Exercise per Session: 0 min  Stress: Stress Concern Present (06/07/2023)   [employer] of Occupational Health - Occupational Stress Questionnaire    Feeling of Stress : Rather much  Social Connections: Socially Isolated (06/07/2023)   Social Connection and Isolation Panel [NHANES]    Frequency of Communication with Friends and Family: More than three times a week    Frequency of Social Gatherings with Friends and Family: Never    Attends Religious Services: Never    Database administrator or Organizations: No    Attends Banker Meetings: Never    Marital Status: Divorced  Catering manager Violence: Not At Risk (06/07/2023)   Humiliation, Afraid, Rape, and Kick questionnaire    Fear of Current or Ex-Partner: No    Emotionally Abused: No    Physically Abused: No    Sexually Abused: No    Family History  Problem Relation Age of Onset   Heart disease Mother    Diabetes Mother    Esophageal cancer Father        4's   Diabetes Sister    Diabetes Sister    Colon cancer Neg  Hx      Review of Systems  Constitutional: Negative.  Negative for chills and fever.  HENT: Negative.  Negative for congestion and sore throat.   Eyes:  Positive for redness. Negative for blurred vision and double vision.  Respiratory: Negative.  Negative for cough and shortness of breath.   Cardiovascular:  Negative for chest pain and palpitations.  Gastrointestinal:  Negative for abdominal pain, nausea and vomiting.  Genitourinary: Negative.  Negative for dysuria and hematuria.  Skin: Negative.  Negative for rash.  Neurological:  Negative for dizziness and headaches.    Vitals:   06/23/23 1452  BP: (!) 148/80  Pulse: 68  Resp: 16  Temp: 98.3 F (36.8 C)  SpO2: 97%    Physical Exam Vitals reviewed.  Constitutional:      Appearance: Normal appearance.  HENT:     Head: Normocephalic.     Mouth/Throat:     Mouth: Mucous membranes are moist.     Pharynx: Oropharynx is clear.  Eyes:     Extraocular Movements: Extraocular movements intact.     Conjunctiva/sclera: Conjunctivae normal.     Pupils: Pupils are equal, round, and reactive to light.  Cardiovascular:     Rate and Rhythm: Normal rate and regular rhythm.     Pulses: Normal pulses.     Heart sounds: Normal heart sounds.  Pulmonary:     Effort: Pulmonary effort is normal.     Breath sounds: Normal breath sounds.  Musculoskeletal:     Cervical back: No tenderness.  Lymphadenopathy:     Cervical: No cervical adenopathy.  Skin:    General: Skin is warm and dry.     Capillary Refill: Capillary refill takes less than 2 seconds.  Neurological:     General: No focal deficit present.     Mental Status: She is alert and oriented to person, place, and time.  Psychiatric:        Mood and Affect: Mood normal.        Behavior: Behavior normal.      ASSESSMENT & PLAN: A total of 42 minutes was spent with the patient and counseling/coordination of care regarding preparing for this visit, review of most recent office  visit notes, review of multiple chronic medical conditions and their management, review of all medications, review of most recent bloodwork results, review of health maintenance items, education on nutrition, prognosis, documentation, and need for follow up.   Problem List Items Addressed This Visit       Cardiovascular and Mediastinum   Essential hypertension   Well-controlled hypertension Continue lisinopril 10 mg daily Cardiovascular risks associated with hypertension discussed Dietary approaches to stop hypertension discussed        Other   Dyslipidemia   Chronic stable condition Continue rosuvastatin 10 mg daily Diet and nutrition discussed      Anxiety and depression   Currently stable and well-controlled Continues Prozac 10 mg daily and alprazolam 0.5 mg as needed Stress management discussed      Allergic conjunctivitis of both eyes - Primary   No signs of bacterial conjunctivitis Seasonal.  Recommend topical antihistamines Pataday ophthalmic solution recommended      Relevant Medications   Olopatadine HCl 0.2 % SOLN   Patient Instructions  Allergic Conjunctivitis, Adult  Allergic conjunctivitis is inflammation of the clear membrane that covers the white part of your eye and the inner surface of your eyelid. This area is called the conjunctiva. This condition can make your eye red or pink. It can also make your eye feel itchy. This condition is not contagious. This means that it cannot be spread from one person to another person. What are the causes? This condition is caused by allergens. These are things that can cause an allergic reaction in some people. Common allergens include: Outdoor allergens, such as: Pollen, including pollen from grass and weeds. Mold. Car fumes. Indoor allergens, such as: Dust. Smoke. Mold. Proteins in a pet's pee (urine), saliva, or dander. Proteins that build up in  contact lenses. What increases the risk? You are more likely  to develop this condition if you have a family history of these things: Allergies. Conditions that you get because of allergens, such as asthma or inflammation of the skin (eczema). What are the signs or symptoms? Symptoms of this condition include eyes that are: Itchy. Red. Watery. Puffy. Your eyes may also: Sting or burn. Have clear fluid draining from them. Have thick mucus coming from them. This happens in severe cases. How is this treated? Treatment for this condition may include: Using cold, wet cloths (cold compresses) to soothe itching and swelling. Washing the face, hair, and clothing to remove allergens. Using eye drops. These may include: Eye drops that block allergies. Eye drops that reduce swelling and irritation. Steroid eye drops if other treatments have not worked. Oral antihistamine medicines. These medicines lessen your allergies. You may need these if eye drops do not help or are difficult to use. Air purifier at home and work. Wrap around sunglasses. This may help to block allergens from reaching the eye. Not wearing contact lenses, if the doctor has found that contact lenses caused your symptoms. Use daily wear disposal contact lenses instead. Follow these instructions at home: Eye care Place a cool, clean washcloth on your eye for 10-20 minutes. Do this 3-4 times a day. Do not touch or rub your eyes. Do not wear contact lenses until the inflammation is gone. Wear glasses instead. Do not wear eye makeup until the inflammation is gone. General instructions Try not to be around things that you are allergic to. Take or apply over-the-counter and prescription medicines only as told by your doctor. These include any eye drops. Drink enough fluid to keep your pee pale yellow. Keep all follow-up visits. Contact a doctor if: Your symptoms get worse. Your symptoms do not get better with treatment. You have mild eye pain. You are sensitive to light. You have spots  or blisters on your eyes. You have pus coming from your eye. You have a fever. Get help right away if: You have redness, swelling, or other symptoms in only one eye. You cannot see well. You have other vision changes. You have very bad eye pain. Summary Allergic conjunctivitis is caused by allergens. It can make your eye red or pink, and it can make your eye feel itchy. This condition cannot be spread from one person to another person. Avoid things that you are allergic to. Take or apply over-the-counter and prescription medicines only as told by your doctor. These include any eye drops. Contact your doctor if your symptoms get worse or they do not get better with treatment. This information is not intended to replace advice given to you by your health care provider. Make sure you discuss any questions you have with your health care provider. Document Revised: 05/02/2021 Document Reviewed: 05/02/2021 Elsevier Patient Education  2024 Elsevier Inc.     Maryagnes Small, MD [facility]

## 2023-06-23 NOTE — Assessment & Plan Note (Signed)
Well-controlled hypertension Continue lisinopril 10 mg daily Cardiovascular risks associated with hypertension discussed Dietary approaches to stop hypertension discussed

## 2023-06-23 NOTE — Assessment & Plan Note (Signed)
 No signs of bacterial conjunctivitis Seasonal.  Recommend topical antihistamines Pataday ophthalmic solution recommended

## 2023-06-23 NOTE — Assessment & Plan Note (Signed)
Chronic stable condition.  Continue rosuvastatin 10 mg daily. Diet and nutrition discussed. 

## 2023-06-23 NOTE — Assessment & Plan Note (Signed)
Currently stable and well-controlled Continues Prozac 10 mg daily and alprazolam 0.5 mg as needed Stress management discussed

## 2023-06-24 ENCOUNTER — Telehealth: Payer: Self-pay | Admitting: Emergency Medicine

## 2023-06-24 NOTE — Telephone Encounter (Signed)
 Copied from CRM [id number]. Topic: Clinical - Medical Advice >> Jun 24, 2023 10:40 AM Albertha Alosa wrote: Reason for CRM: Patient called in regarding previous appointment , stating she had someone to come over to clean, stating they put something in the vent and also outside in the ac unit, wanted to know if that could be the reason she is having issues with her eyes, would like for Dr.Sargardia to call her back to see if this is okay for her health or not    [phone]

## 2023-06-26 ENCOUNTER — Other Ambulatory Visit: Payer: Self-pay | Admitting: Emergency Medicine

## 2023-06-26 DIAGNOSIS — M25512 Pain in left shoulder: Secondary | ICD-10-CM

## 2023-06-29 NOTE — Telephone Encounter (Signed)
 Spoke with patient and she states her eyes do feel better but also mentioned she had a recent eye surgery and doesn't know if its from that.. I informed her to reach out to the place she had the eye surgery and let them know her concerns. And if needed to call out office to schedule with the provider

## 2023-06-30 ENCOUNTER — Other Ambulatory Visit

## 2023-07-01 ENCOUNTER — Ambulatory Visit (INDEPENDENT_AMBULATORY_CARE_PROVIDER_SITE_OTHER): Admitting: Family Medicine

## 2023-07-01 ENCOUNTER — Other Ambulatory Visit: Payer: Self-pay

## 2023-07-01 ENCOUNTER — Ambulatory Visit (INDEPENDENT_AMBULATORY_CARE_PROVIDER_SITE_OTHER)

## 2023-07-01 VITALS — BP 138/80 | HR 77 | Ht 60.0 in | Wt 146.0 lb

## 2023-07-01 DIAGNOSIS — M25512 Pain in left shoulder: Secondary | ICD-10-CM

## 2023-07-01 DIAGNOSIS — G8929 Other chronic pain: Secondary | ICD-10-CM

## 2023-07-01 NOTE — Patient Instructions (Addendum)
 Thank you for coming in today.   Please get an Xray today before you leave   You should hear from MRI scheduling within 1 week. If you do not hear please let me know.    Check back after we get the MRI results

## 2023-07-01 NOTE — Progress Notes (Signed)
   Joanna Muck, PhD, LAT, ATC acting as a scribe for Garlan Juniper, MD.  Katie Gardner is a 68 y.o. female who presents to [facility] today for exacerbation of her L shoulder pain. Pt was last seen by Dr. Alease Hunter on 12/09/22 and was advised to give PT and HEP another month.   Today, pt reports mechanical symptoms in her L shoulder. Difficulty in trying to lift weights at the gym. She has been intermittently taking meloxicam .   Pertinent review of systems: No fevers or chills  Relevant historical information: Hypertension   Exam:  BP 138/80   Pulse 77   Ht 5' (1.524 m)   Wt 146 lb (66.2 kg)   SpO2 98%   BMI 28.51 kg/m  General: Well Developed, well nourished, and in no acute distress.   MSK: Left shoulder: Normal-appearing Normal motion some pain with abduction. Strength reduced abduction 4/5 left shoulder. Mildly positive Hawkins and Neer's test. Intact strength.    Lab and Radiology Results  X-ray images left shoulder obtained today personally and independently interpreted. Minimal degenerative changes.  No acute fractures are visible. Await formal radiology review    Assessment and Plan: 68 y.o. female with chronic left shoulder pain.  Patient has failed to improve with good physical therapy trial.  Symptoms been ongoing for greater than 6 months.  Plan for MRI of the shoulder to further evaluate source of pain and for potential injection planning or surgical planning.  Anticipate recheck after MRI or potentially referral directly to surgery.   PDMP not reviewed this encounter. Orders Placed This Encounter  Procedures   DG Shoulder Left    Standing Status:   Future    Number of Occurrences:   1    Expiration Date:   07/31/2023    Reason for Exam (SYMPTOM  OR DIAGNOSIS REQUIRED):   left shoulder pain    Preferred imaging location?:   [facility]   MR SHOULDER LEFT WO CONTRAST    Standing Status:   Future    Expiration  Date:   06/30/2024    What is the patient's sedation requirement?:   No Sedation    Does the patient have a pacemaker or implanted devices?:   No    Preferred imaging location?:   [facility] (table limit-550lbs)   No orders of the defined types were placed in this encounter.    Discussed warning signs or symptoms. Please see discharge instructions. Patient expresses understanding.   The above documentation has been reviewed and is accurate and complete Garlan Juniper, M.D.

## 2023-07-08 NOTE — Progress Notes (Signed)
 Left shoulder x-ray shows evidence of chronic rotator cuff problems.

## 2023-07-09 ENCOUNTER — Other Ambulatory Visit: Payer: Self-pay | Admitting: Emergency Medicine

## 2023-07-09 DIAGNOSIS — M25512 Pain in left shoulder: Secondary | ICD-10-CM

## 2023-07-15 ENCOUNTER — Ambulatory Visit (INDEPENDENT_AMBULATORY_CARE_PROVIDER_SITE_OTHER): Admitting: Emergency Medicine

## 2023-07-15 ENCOUNTER — Encounter: Payer: Self-pay | Admitting: Emergency Medicine

## 2023-07-15 VITALS — BP 120/76 | HR 67 | Temp 98.4°F | Ht 60.0 in | Wt 144.0 lb

## 2023-07-15 DIAGNOSIS — R5383 Other fatigue: Secondary | ICD-10-CM | POA: Diagnosis not present

## 2023-07-15 DIAGNOSIS — F419 Anxiety disorder, unspecified: Secondary | ICD-10-CM

## 2023-07-15 DIAGNOSIS — F5104 Psychophysiologic insomnia: Secondary | ICD-10-CM

## 2023-07-15 DIAGNOSIS — E785 Hyperlipidemia, unspecified: Secondary | ICD-10-CM

## 2023-07-15 DIAGNOSIS — I10 Essential (primary) hypertension: Secondary | ICD-10-CM

## 2023-07-15 DIAGNOSIS — R29818 Other symptoms and signs involving the nervous system: Secondary | ICD-10-CM | POA: Diagnosis not present

## 2023-07-15 DIAGNOSIS — F32A Depression, unspecified: Secondary | ICD-10-CM

## 2023-07-15 MED ORDER — HYDROXYZINE HCL 25 MG PO TABS
25.0000 mg | ORAL_TABLET | Freq: Every evening | ORAL | 1 refills | Status: DC | PRN
Start: 2023-07-15 — End: 2023-11-16

## 2023-07-15 NOTE — Assessment & Plan Note (Signed)
 Sleep hygiene measures discussed Suspected sleep apnea Recommend Vistaril 25 mg at bedtime

## 2023-07-15 NOTE — Assessment & Plan Note (Signed)
Clinically stable.  No red flag signs or symptoms. Differential diagnosis discussed. No recent changes in her lifestyle choices. Recommend to do blood work today Could be related to possible sleep apnea Recommend sleep apnea studies Depression/anxiety may be a contributing factor

## 2023-07-15 NOTE — Assessment & Plan Note (Signed)
Chronic stable condition.  Continue rosuvastatin 10 mg daily. Diet and nutrition discussed. 

## 2023-07-15 NOTE — Assessment & Plan Note (Signed)
 New referral placed today Contributing to chronic insomnia Contributing to overall symptoms Recommend home sleep study

## 2023-07-15 NOTE — Assessment & Plan Note (Signed)
Currently stable and well-controlled Continues Prozac 10 mg daily and alprazolam 0.5 mg as needed Stress management discussed

## 2023-07-15 NOTE — Assessment & Plan Note (Signed)
Well-controlled hypertension Continue lisinopril 10 mg daily Cardiovascular risks associated with hypertension discussed Dietary approaches to stop hypertension discussed

## 2023-07-15 NOTE — Progress Notes (Signed)
 Katie Gardner 68 y.o.   Chief Complaint  Patient presents with   Fatigue    Patient states she's been feeling real tired that causes her mood to be bad. She states she works out all the time, walks every day and still feels tired. She mentions she only gets 5 hours of sleep but her sleep schedule is not the same. Wants lab work done. Patient thinks it may be all the vitamins she's taking that be causing the tiredness     HISTORY OF PRESENT ILLNESS: This is a 68 y.o. female complaining of general tiredness for the last several weeks Requesting blood work.  Unknown trigger. Not sleeping well.  Taking multiple vitamins.  HPI   [name]   Medication Sig Start Date End Date Taking? Authorizing Provider  ALPRAZolam  (XANAX ) 0.5 MG tablet TAKE 1 TABLET BY MOUTH EVERY DAY AS NEEDED FOR ANXIETY 05/27/23  Yes Toshie Demelo, Isidro Margo, MD  cyclobenzaprine  (FLEXERIL ) 5 MG tablet TAKE 1 TABLET(5 MG) BY MOUTH THREE TIMES DAILY AS NEEDED FOR MUSCLE SPASMS 09/21/22  Yes Beva Remund, Isidro Margo, MD  FLUoxetine  (PROZAC ) 10 MG tablet Take 1 tablet (10 mg total) by mouth daily. 05/04/22  Yes Jackline Castilla, Isidro Margo, MD  loratadine  (CLARITIN ) 10 MG tablet Take 1 tablet (10 mg total) by mouth daily. 09/12/18  Yes Grigor Lipschutz Jose, MD  clotrimazole -betamethasone  (LOTRISONE ) cream Apply 1 Application topically daily. Patient not taking: Reported on 07/15/2023 02/15/23   Elvira Hammersmith, MD  fluticasone  (FLONASE ) 50 MCG/ACT nasal spray SHAKE LIQUID AND USE 2 SPRAYS IN EACH NOSTRIL DAILY Patient not taking: Reported on 07/15/2023 06/25/22   Alyson Back L, NP-C  lisinopril  (ZESTRIL ) 10 MG tablet TAKE 1 TABLET(10 MG) BY MOUTH DAILY Patient not taking: Reported on 07/15/2023 11/15/21   Asha Grumbine Jose, MD  meloxicam  (MOBIC ) 15 MG tablet TAKE 1 TABLET(15 MG) BY MOUTH DAILY FOR 10 DAYS Patient not taking: Reported on 07/15/2023 07/09/23   Haruto Demaria Jose, MD  Olopatadine  HCl 0.2 % SOLN  Apply 2 drops to eye every 6 (six) hours as needed. Patient not taking: Reported on 07/15/2023 06/23/23   Charlott Calvario Jose, MD  rosuvastatin  (CRESTOR ) 10 MG tablet TAKE 1 TABLET(10 MG) BY MOUTH DAILY Patient not taking: Reported on 07/15/2023 04/01/21   Elvira Hammersmith, MD    Allergies  Allergen Reactions   Macrobid  [Nitrofurantoin ] Nausea And Vomiting   Hydrocodone Nausea And Vomiting    dizzy   Mucinex Dm [Dm-Guaifenesin Er] Swelling    Sob, throat closing    Patient Active Problem List   Diagnosis Date Noted   Allergic conjunctivitis of both eyes 06/23/2023   Suspected sleep apnea 06/09/2022   Urinary incontinence without sensory awareness 03/05/2022   Stress incontinence in female 02/17/2022   Skin tags, multiple acquired 07/27/2019   Chronic insomnia 05/31/2019   Essential hypertension 12/17/2017   Seasonal allergies 12/17/2017   Anxiety and depression 11/13/2016   Chronic anxiety 11/13/2016   Hot flashes    Situational anxiety    Dyslipidemia     Past Medical History:  Diagnosis Date   Allergy    seasonal   Anxiety    Depression    Family history of adverse reaction to anesthesia    sister hard to wake up   Hematuria    Hot flashes    Hx of cardiovascular stress test    Lex MV 12/13:  EF 68%, mild apical thinning, no ischemia   Hyperlipidemia  Hypertension    PONV (postoperative nausea and vomiting)     Past Surgical History:  Procedure Laterality Date   BREAST LUMPECTOMY     right breast, not cancer   Carpel tunnel     ECTOPIC PREGNANCY SURGERY      Social History   Socioeconomic History   Marital status: Divorced    Spouse name: Not on file   Number of children: 1   Years of education: 8th grade   Highest education level: Not on file  Occupational History    Employer: UNEMPLOYED  Tobacco Use   Smoking status: Former    Current packs/day: 0.00    Average packs/day: 1 pack/day for 6.0 years (6.0 ttl pk-yrs)    Types: Cigarettes     Start date: 03/09/1974    Quit date: 03/09/1980    Years since quitting: 43.3    Passive exposure: Past   Smokeless tobacco: Never  Vaping Use   Vaping status: Never Used  Substance and Sexual Activity   Alcohol use: Not Currently   Drug use: No   Sexual activity: Yes  Other Topics Concern   Not on file  Social History Narrative   Caffiene coffee 1 cup   Live alone.   Work" retired   Teacher, early years/pre Strain: Low Risk  (06/07/2023)   Overall Financial Resource Strain (CARDIA)    Difficulty of Paying Living Expenses: Not hard at all  Food Insecurity: No Food Insecurity (06/07/2023)   Hunger Vital Sign    Worried About Running Out of Food in the Last Year: Never true    Ran Out of Food in the Last Year: Never true  Transportation Needs: No Transportation Needs (06/07/2023)   PRAPARE - Administrator, Civil Service (Medical): No    Lack of Transportation (Non-Medical): No  Physical Activity: Inactive (06/07/2023)   Exercise Vital Sign    Days of Exercise per Week: 0 days    Minutes of Exercise per Session: 0 min  Stress: Stress Concern Present (06/07/2023)   [employer] of Occupational Health - Occupational Stress Questionnaire    Feeling of Stress : Rather much  Social Connections: Socially Isolated (06/07/2023)   Social Connection and Isolation Panel [NHANES]    Frequency of Communication with Friends and Family: More than three times a week    Frequency of Social Gatherings with Friends and Family: Never    Attends Religious Services: Never    Database administrator or Organizations: No    Attends Banker Meetings: Never    Marital Status: Divorced  Catering manager Violence: Not At Risk (06/07/2023)   Humiliation, Afraid, Rape, and Kick questionnaire    Fear of Current or Ex-Partner: No    Emotionally Abused: No    Physically Abused: No    Sexually Abused: No    Family History  Problem Relation Age of Onset    Heart disease Mother    Diabetes Mother    Esophageal cancer Father        41's   Diabetes Sister    Diabetes Sister    Colon cancer Neg Hx      Review of Systems  Constitutional:  Positive for malaise/fatigue. Negative for chills and fever.  HENT: Negative.  Negative for congestion and sore throat.   Respiratory: Negative.  Negative for cough and shortness of breath.   Cardiovascular: Negative.  Negative for chest pain and palpitations.  Gastrointestinal:  Negative for  abdominal pain, diarrhea, nausea and vomiting.  Genitourinary: Negative.  Negative for dysuria and hematuria.  Skin: Negative.  Negative for rash.  Neurological: Negative.  Negative for dizziness and headaches.    Vitals:   07/15/23 1552  BP: 120/76  Pulse: 67  Temp: 98.4 F (36.9 C)  SpO2: 95%    Physical Exam Vitals reviewed.  Constitutional:      Appearance: Normal appearance.  HENT:     Head: Normocephalic.     Mouth/Throat:     Mouth: Mucous membranes are moist.     Pharynx: Oropharynx is clear.  Eyes:     Extraocular Movements: Extraocular movements intact.     Pupils: Pupils are equal, round, and reactive to light.  Cardiovascular:     Rate and Rhythm: Normal rate and regular rhythm.     Pulses: Normal pulses.     Heart sounds: Normal heart sounds.  Pulmonary:     Effort: Pulmonary effort is normal.     Breath sounds: Normal breath sounds.  Musculoskeletal:     Cervical back: No tenderness.  Lymphadenopathy:     Cervical: No cervical adenopathy.  Skin:    General: Skin is warm and dry.     Capillary Refill: Capillary refill takes less than 2 seconds.  Neurological:     General: No focal deficit present.     Mental Status: She is alert and oriented to person, place, and time.  Psychiatric:        Mood and Affect: Mood normal.        Behavior: Behavior normal.      ASSESSMENT & PLAN: A total of 45 minutes was spent with the patient and counseling/coordination of care regarding  preparing for this visit, review of most recent office visit notes, review of multiple chronic medical conditions and their management, review of all medications, review of most recent bloodwork results, review of health maintenance items, education on nutrition, prognosis, documentation, and need for follow up.   Problem List Items Addressed This Visit       Cardiovascular and Mediastinum   Essential hypertension   Well-controlled hypertension Continue lisinopril  10 mg daily Cardiovascular risks associated with hypertension discussed Dietary approaches to stop hypertension discussed        Other   Dyslipidemia   Chronic stable condition Continue rosuvastatin  10 mg daily Diet and nutrition discussed      Anxiety and depression   Currently stable and well-controlled Continues Prozac  10 mg daily and alprazolam  0.5 mg as needed Stress management discussed      Relevant Medications   hydrOXYzine (ATARAX) 25 MG tablet   Chronic insomnia   Sleep hygiene measures discussed Suspected sleep apnea Recommend Vistaril 25 mg at bedtime      Relevant Medications   hydrOXYzine (ATARAX) 25 MG tablet   Tiredness - Primary   Clinically stable.  No red flag signs or symptoms. Differential diagnosis discussed. No recent changes in her lifestyle choices. Recommend to do blood work today Could be related to possible sleep apnea Recommend sleep apnea studies Depression/anxiety may be a contributing factor      Relevant Orders   CBC with Differential/Platelet   Comprehensive metabolic panel with GFR   TSH   Vitamin B12   VITAMIN D  25 Hydroxy (Vit-D Deficiency, Fractures)   Hemoglobin A1c   Lipid panel   Magnesium   Suspected sleep apnea   New referral placed today Contributing to chronic insomnia Contributing to overall symptoms Recommend home sleep study  Relevant Orders   Home sleep test   Patient Instructions  Health Maintenance After Age 8 After age 23, you are  at a higher risk for certain long-term diseases and infections as well as injuries from falls. Falls are a major cause of broken bones and head injuries in people who are older than age 79. Getting regular preventive care can help to keep you healthy and well. Preventive care includes getting regular testing and making lifestyle changes as recommended by your health care provider. Talk with your health care provider about: Which screenings and tests you should have. A screening is a test that checks for a disease when you have no symptoms. A diet and exercise plan that is right for you. What should I know about screenings and tests to prevent falls? Screening and testing are the best ways to find a health problem early. Early diagnosis and treatment give you the best chance of managing medical conditions that are common after age 87. Certain conditions and lifestyle choices may make you more likely to have a fall. Your health care provider may recommend: Regular vision checks. Poor vision and conditions such as cataracts can make you more likely to have a fall. If you wear glasses, make sure to get your prescription updated if your vision changes. Medicine review. Work with your health care provider to regularly review all of the medicines you are taking, including over-the-counter medicines. Ask your health care provider about any side effects that may make you more likely to have a fall. Tell your health care provider if any medicines that you take make you feel dizzy or sleepy. Strength and balance checks. Your health care provider may recommend certain tests to check your strength and balance while standing, walking, or changing positions. Foot health exam. Foot pain and numbness, as well as not wearing proper footwear, can make you more likely to have a fall. Screenings, including: Osteoporosis screening. Osteoporosis is a condition that causes the bones to get weaker and break more easily. Blood  pressure screening. Blood pressure changes and medicines to control blood pressure can make you feel dizzy. Depression screening. You may be more likely to have a fall if you have a fear of falling, feel depressed, or feel unable to do activities that you used to do. Alcohol use screening. Using too much alcohol can affect your balance and may make you more likely to have a fall. Follow these instructions at home: Lifestyle Do not drink alcohol if: Your health care provider tells you not to drink. If you drink alcohol: Limit how much you have to: 0-1 drink a day for women. 0-2 drinks a day for men. Know how much alcohol is in your drink. In the [place] one drink equals one 12 oz bottle of beer (355 mL), one 5 oz glass of wine (148 mL), or one 1 oz glass of hard liquor (44 mL). Do not use any products that contain nicotine or tobacco. These products include cigarettes, chewing tobacco, and vaping devices, such as e-cigarettes. If you need help quitting, ask your health care provider. Activity  Follow a regular exercise program to stay fit. This will help you maintain your balance. Ask your health care provider what types of exercise are appropriate for you. If you need a cane or walker, use it as recommended by your health care provider. Wear supportive shoes that have nonskid soles. Safety  Remove any tripping hazards, such as rugs, cords, and clutter. Install safety equipment such as  grab bars in bathrooms and safety rails on stairs. Keep rooms and walkways well-lit. General instructions Talk with your health care provider about your risks for falling. Tell your health care provider if: You fall. Be sure to tell your health care provider about all falls, even ones that seem minor. You feel dizzy, tiredness (fatigue), or off-balance. Take over-the-counter and prescription medicines only as told by your health care provider. These include supplements. Eat a healthy diet and maintain a  healthy weight. A healthy diet includes low-fat dairy products, low-fat (lean) meats, and fiber from whole grains, beans, and lots of fruits and vegetables. Stay current with your vaccines. Schedule regular health, dental, and eye exams. Summary Having a healthy lifestyle and getting preventive care can help to protect your health and wellness after age 15. Screening and testing are the best way to find a health problem early and help you avoid having a fall. Early diagnosis and treatment give you the best chance for managing medical conditions that are more common for people who are older than age 22. Falls are a major cause of broken bones and head injuries in people who are older than age 50. Take precautions to prevent a fall at home. Work with your health care provider to learn what changes you can make to improve your health and wellness and to prevent falls. This information is not intended to replace advice given to you by your health care provider. Make sure you discuss any questions you have with your health care provider. Document Revised: 07/15/2020 Document Reviewed: 07/15/2020 Elsevier Patient Education  2024 Elsevier Inc.     Maryagnes Small, MD [facility]

## 2023-07-15 NOTE — Patient Instructions (Signed)
 Health Maintenance After Age 68 After age 4, you are at a higher risk for certain long-term diseases and infections as well as injuries from falls. Falls are a major cause of broken bones and head injuries in people who are older than age 47. Getting regular preventive care can help to keep you healthy and well. Preventive care includes getting regular testing and making lifestyle changes as recommended by your health care provider. Talk with your health care provider about: Which screenings and tests you should have. A screening is a test that checks for a disease when you have no symptoms. A diet and exercise plan that is right for you. What should I know about screenings and tests to prevent falls? Screening and testing are the best ways to find a health problem early. Early diagnosis and treatment give you the best chance of managing medical conditions that are common after age 37. Certain conditions and lifestyle choices may make you more likely to have a fall. Your health care provider may recommend: Regular vision checks. Poor vision and conditions such as cataracts can make you more likely to have a fall. If you wear glasses, make sure to get your prescription updated if your vision changes. Medicine review. Work with your health care provider to regularly review all of the medicines you are taking, including over-the-counter medicines. Ask your health care provider about any side effects that may make you more likely to have a fall. Tell your health care provider if any medicines that you take make you feel dizzy or sleepy. Strength and balance checks. Your health care provider may recommend certain tests to check your strength and balance while standing, walking, or changing positions. Foot health exam. Foot pain and numbness, as well as not wearing proper footwear, can make you more likely to have a fall. Screenings, including: Osteoporosis screening. Osteoporosis is a condition that causes  the bones to get weaker and break more easily. Blood pressure screening. Blood pressure changes and medicines to control blood pressure can make you feel dizzy. Depression screening. You may be more likely to have a fall if you have a fear of falling, feel depressed, or feel unable to do activities that you used to do. Alcohol use screening. Using too much alcohol can affect your balance and may make you more likely to have a fall. Follow these instructions at home: Lifestyle Do not drink alcohol if: Your health care provider tells you not to drink. If you drink alcohol: Limit how much you have to: 0-1 drink a day for women. 0-2 drinks a day for men. Know how much alcohol is in your drink. In the [place] one drink equals one 12 oz bottle of beer (355 mL), one 5 oz glass of wine (148 mL), or one 1 oz glass of hard liquor (44 mL). Do not use any products that contain nicotine or tobacco. These products include cigarettes, chewing tobacco, and vaping devices, such as e-cigarettes. If you need help quitting, ask your health care provider. Activity  Follow a regular exercise program to stay fit. This will help you maintain your balance. Ask your health care provider what types of exercise are appropriate for you. If you need a cane or walker, use it as recommended by your health care provider. Wear supportive shoes that have nonskid soles. Safety  Remove any tripping hazards, such as rugs, cords, and clutter. Install safety equipment such as grab bars in bathrooms and safety rails on stairs. Keep rooms and walkways  well-lit. General instructions Talk with your health care provider about your risks for falling. Tell your health care provider if: You fall. Be sure to tell your health care provider about all falls, even ones that seem minor. You feel dizzy, tiredness (fatigue), or off-balance. Take over-the-counter and prescription medicines only as told by your health care provider. These include  supplements. Eat a healthy diet and maintain a healthy weight. A healthy diet includes low-fat dairy products, low-fat (lean) meats, and fiber from whole grains, beans, and lots of fruits and vegetables. Stay current with your vaccines. Schedule regular health, dental, and eye exams. Summary Having a healthy lifestyle and getting preventive care can help to protect your health and wellness after age 11. Screening and testing are the best way to find a health problem early and help you avoid having a fall. Early diagnosis and treatment give you the best chance for managing medical conditions that are more common for people who are older than age 28. Falls are a major cause of broken bones and head injuries in people who are older than age 48. Take precautions to prevent a fall at home. Work with your health care provider to learn what changes you can make to improve your health and wellness and to prevent falls. This information is not intended to replace advice given to you by your health care provider. Make sure you discuss any questions you have with your health care provider. Document Revised: 07/15/2020 Document Reviewed: 07/15/2020 Elsevier Patient Education  2024 [employer].

## 2023-07-16 ENCOUNTER — Encounter: Payer: Self-pay | Admitting: Emergency Medicine

## 2023-07-16 LAB — LIPID PANEL
Cholesterol: 258 mg/dL — ABNORMAL HIGH (ref 0–200)
HDL: 62.7 mg/dL (ref >39.00)
LDL Cholesterol: 163 mg/dL — ABNORMAL HIGH (ref 0–99)
NonHDL: 195.16
Total CHOL/HDL Ratio: 4
Triglycerides: 162 mg/dL — ABNORMAL HIGH (ref 0.0–149.0)
VLDL: 32.4 mg/dL (ref 0.0–40.0)

## 2023-07-16 LAB — COMPREHENSIVE METABOLIC PANEL WITH GFR
ALT: 23 U/L (ref 0–35)
AST: 23 U/L (ref 0–37)
Albumin: 4.2 g/dL (ref 3.5–5.2)
Alkaline Phosphatase: 68 U/L (ref 39–117)
BUN: 21 mg/dL (ref 6–23)
CO2: 29 meq/L (ref 19–32)
Calcium: 9.2 mg/dL (ref 8.4–10.5)
Chloride: 103 meq/L (ref 96–112)
Creatinine, Ser: 1.06 mg/dL (ref 0.40–1.20)
GFR: 54.31 mL/min — ABNORMAL LOW (ref >60.00)
Glucose, Bld: 98 mg/dL (ref 70–99)
Potassium: 4.5 meq/L (ref 3.5–5.1)
Sodium: 139 meq/L (ref 135–145)
Total Bilirubin: 0.3 mg/dL (ref 0.2–1.2)
Total Protein: 7.1 g/dL (ref 6.0–8.3)

## 2023-07-16 LAB — CBC WITH DIFFERENTIAL/PLATELET
Basophils Absolute: 0 10*3/uL (ref 0.0–0.1)
Basophils Relative: 0.5 % (ref 0.0–3.0)
Eosinophils Absolute: 0.1 10*3/uL (ref 0.0–0.7)
Eosinophils Relative: 1.3 % (ref 0.0–5.0)
HCT: 39.1 % (ref 36.0–46.0)
Hemoglobin: 13 g/dL (ref 12.0–15.0)
Lymphocytes Relative: 38 % (ref 12.0–46.0)
Lymphs Abs: 2.5 10*3/uL (ref 0.7–4.0)
MCHC: 33.3 g/dL (ref 30.0–36.0)
MCV: 88.3 fl (ref 78.0–100.0)
Monocytes Absolute: 0.7 10*3/uL (ref 0.1–1.0)
Monocytes Relative: 11.1 % (ref 3.0–12.0)
Neutro Abs: 3.2 10*3/uL (ref 1.4–7.7)
Neutrophils Relative %: 49.1 % (ref 43.0–77.0)
Platelets: 237 10*3/uL (ref 150.0–400.0)
RBC: 4.43 Mil/uL (ref 3.87–5.11)
RDW: 13.5 % (ref 11.5–15.5)
WBC: 6.6 10*3/uL (ref 4.0–10.5)

## 2023-07-16 LAB — MAGNESIUM: Magnesium: 2.2 mg/dL (ref 1.5–2.5)

## 2023-07-16 LAB — HEMOGLOBIN A1C: Hgb A1c MFr Bld: 5.8 % (ref 4.6–6.5)

## 2023-07-16 LAB — VITAMIN B12: Vitamin B-12: 694 pg/mL (ref 211–911)

## 2023-07-16 LAB — TSH: TSH: 0.98 u[IU]/mL (ref 0.35–5.50)

## 2023-07-16 LAB — VITAMIN D 25 HYDROXY (VIT D DEFICIENCY, FRACTURES): VITD: 76.55 ng/mL (ref 30.00–100.00)

## 2023-07-22 ENCOUNTER — Other Ambulatory Visit: Payer: Self-pay | Admitting: Radiology

## 2023-07-22 DIAGNOSIS — E785 Hyperlipidemia, unspecified: Secondary | ICD-10-CM

## 2023-07-22 MED ORDER — ROSUVASTATIN CALCIUM 10 MG PO TABS: 10.0000 mg | ORAL_TABLET | Freq: Every day | ORAL | 3 refills | Status: AC

## 2023-07-23 NOTE — Progress Notes (Signed)
 I, Leone Ralphs am a scribe for Dr. Garlan Juniper, MD.  Katie Gardner is a 68 y.o. female who presents to [facility] today for right knee pain. Pt was last seen by Dr. Alease Hunter on 07/01/23 for left shoulder pain. XR of the left shoulder was obtained and MRI of the left shoulder was ordered, scheduled for 08/01/23.  Today, patient reports right knee pain. Today it is doing better. It gets swollen from time to time and there is pain in the back of the knee.  She also has some left knee pain as well that is similar.  Right knee is worse than left.   Pertinent review of systems: No fevers or chills  Relevant historical information: Hypertension.  Chronic left shoulder pain.  MRI scheduled for next week.   Exam:  BP 130/60   Pulse 83   Ht 5' (1.524 m)   SpO2 98%   BMI 28.12 kg/m  General: Well Developed, well nourished, and in no acute distress.   MSK: Bilateral knees mild joint effusion right no joint effusion left.  Normal-appearing otherwise. Normal motion. Tender palpation medial joint line both knees. Intact strength stable ligamentous exam both knees.    Lab and Radiology Results  Diagnostic Limited MSK Ultrasound of: Right knee Quad tendon is intact and normal-appearing with mild joint effusion present in the superior patellar space. Patellar tendon is normal-appearing. Medial joint line mild degeneration. Lateral joint line mild degeneration with a partially extruded lateral meniscus. Posterior knee small Baker's cyst. Impression: Mild DJD mild effusion and small Baker's cyst.   X-ray images bilateral knees obtained today personally and independently interpreted.  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  No acute fractures are visible.  Left knee: Mild medial and patellofemoral DJD.  No acute fractures are visible.  Await formal radiology review  Assessment and Plan: 68 y.o. female with chronic bilateral knee pain right  worse than left.  Pain due to DJD exacerbation with a possible degeneration of the meniscus especially in the right lateral meniscus.  She is a good candidate for quad strengthening exercises.  She would like to avoid surgery or injections if possible.  Plan for quad strengthening exercises at the gym including exercise bike.  Recommend Voltaren  gel and compression sleeve.  Consider steroid injection if not improving.   PDMP not reviewed this encounter. Orders Placed This Encounter  Procedures   US  LIMITED JOINT SPACE STRUCTURES LOW RIGHT(NO LINKED CHARGES)    Reason for Exam (SYMPTOM  OR DIAGNOSIS REQUIRED):   right knee pain    Preferred imaging location?:   [facility] Knee AP/LAT W/Sun[place] Right    Standing Status:   Future    Number of Occurrences:   1    Expiration Date:   08/26/2023    Reason for Exam (SYMPTOM  OR DIAGNOSIS REQUIRED):   right knee pain    Preferred imaging location?:   [facility]   DG Knee AP/LAT W/Sun[place] Left    Standing Status:   Future    Number of Occurrences:   1    Expiration Date:   07/25/2024    Reason for Exam (SYMPTOM  OR DIAGNOSIS REQUIRED):   knee pain    Preferred imaging location?:   [facility]   No orders of the defined types were placed in this encounter.    Discussed warning signs or symptoms. Please see discharge instructions. Patient expresses understanding.  The above documentation has been reviewed and is accurate and complete Garlan Juniper, M.D.

## 2023-07-24 ENCOUNTER — Other Ambulatory Visit: Payer: Self-pay | Admitting: Emergency Medicine

## 2023-07-24 DIAGNOSIS — M25512 Pain in left shoulder: Secondary | ICD-10-CM

## 2023-07-26 ENCOUNTER — Ambulatory Visit (INDEPENDENT_AMBULATORY_CARE_PROVIDER_SITE_OTHER)

## 2023-07-26 ENCOUNTER — Ambulatory Visit: Admitting: Family Medicine

## 2023-07-26 ENCOUNTER — Other Ambulatory Visit: Payer: Self-pay

## 2023-07-26 VITALS — BP 130/60 | HR 83 | Ht 60.0 in

## 2023-07-26 DIAGNOSIS — G8929 Other chronic pain: Secondary | ICD-10-CM | POA: Diagnosis not present

## 2023-07-26 DIAGNOSIS — M25562 Pain in left knee: Secondary | ICD-10-CM

## 2023-07-26 DIAGNOSIS — M25561 Pain in right knee: Secondary | ICD-10-CM

## 2023-07-26 DIAGNOSIS — M1712 Unilateral primary osteoarthritis, left knee: Secondary | ICD-10-CM | POA: Diagnosis not present

## 2023-07-26 NOTE — Patient Instructions (Signed)
 Thank you for coming in today. Please get x-rays on the way out. Use Voltaren  gel. Ok to go to the gym and use bike.

## 2023-07-28 ENCOUNTER — Other Ambulatory Visit: Payer: Self-pay | Admitting: Emergency Medicine

## 2023-07-28 DIAGNOSIS — F418 Other specified anxiety disorders: Secondary | ICD-10-CM

## 2023-07-28 DIAGNOSIS — F32A Depression, unspecified: Secondary | ICD-10-CM

## 2023-07-28 DIAGNOSIS — F419 Anxiety disorder, unspecified: Secondary | ICD-10-CM

## 2023-08-01 ENCOUNTER — Other Ambulatory Visit

## 2023-08-03 ENCOUNTER — Ambulatory Visit: Payer: Self-pay | Admitting: Family Medicine

## 2023-08-03 NOTE — Progress Notes (Signed)
 Right knee x-ray shows mild arthritis.

## 2023-08-03 NOTE — Progress Notes (Signed)
 Left knee x-ray shows mild arthritis.

## 2023-09-13 IMAGING — DX DG CHEST 2V
2 series · 2 of 2 positions shown · non-contrast
Comparison: 01/15/2017

CLINICAL DATA: Cough with pleuritic chest pain

EXAM:
CHEST - 2 VIEW

[chest pa]
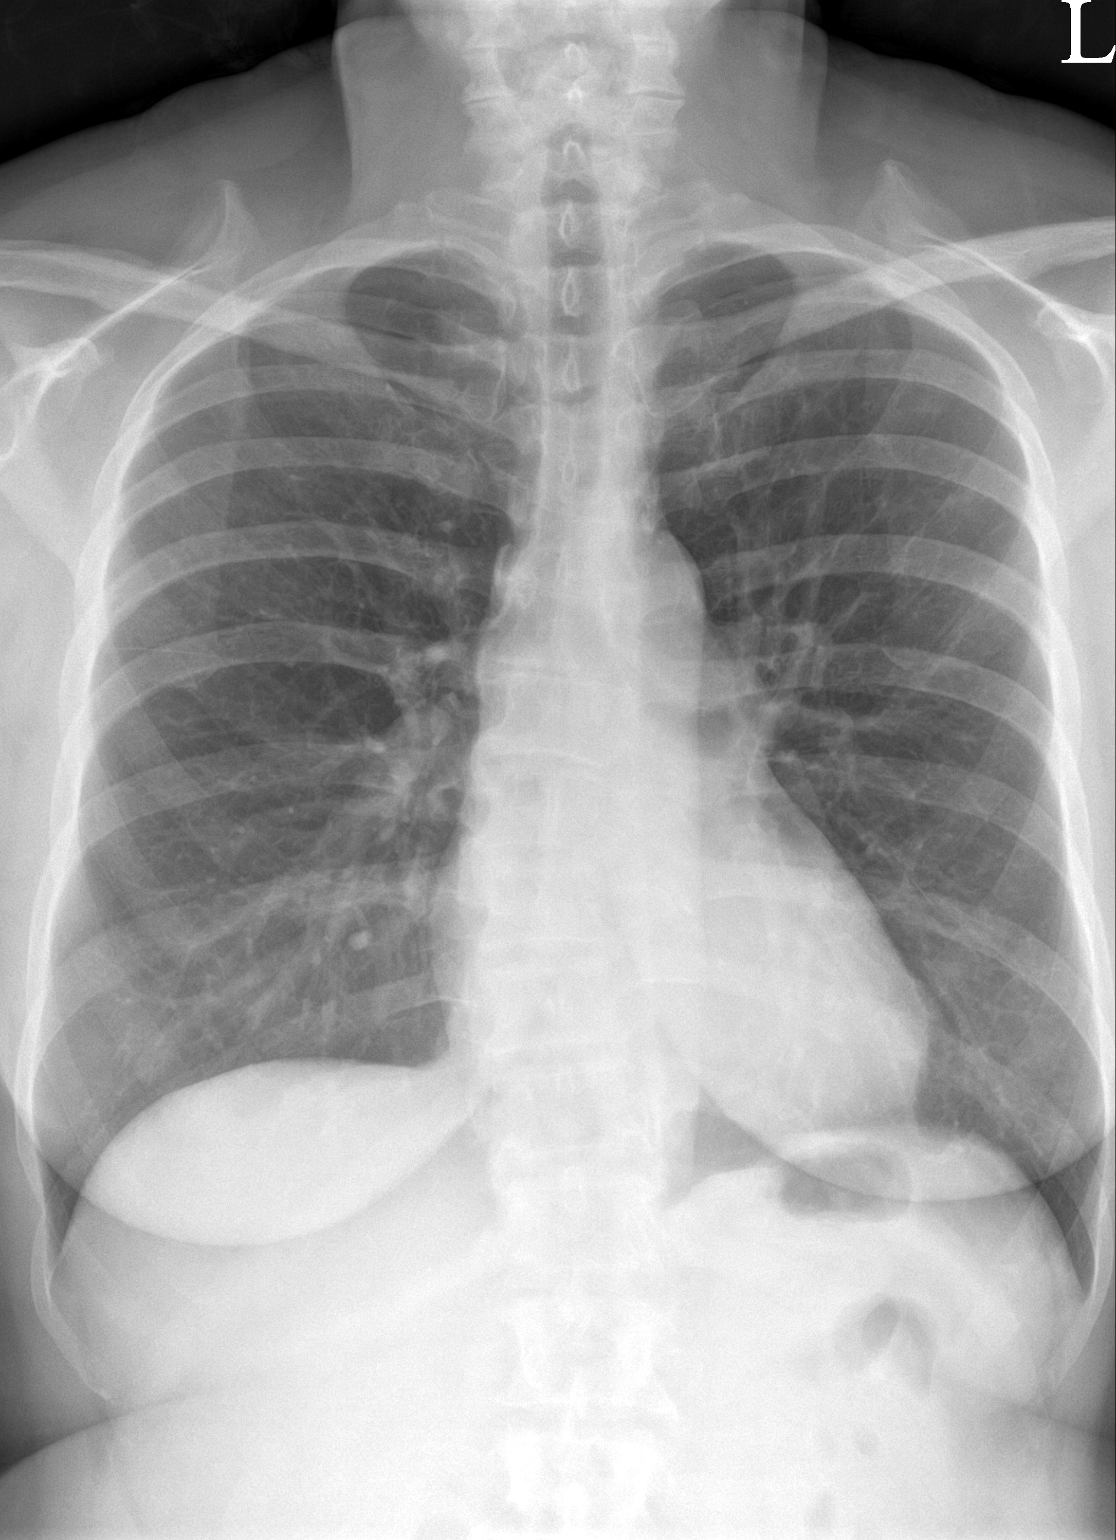

[chest lat]
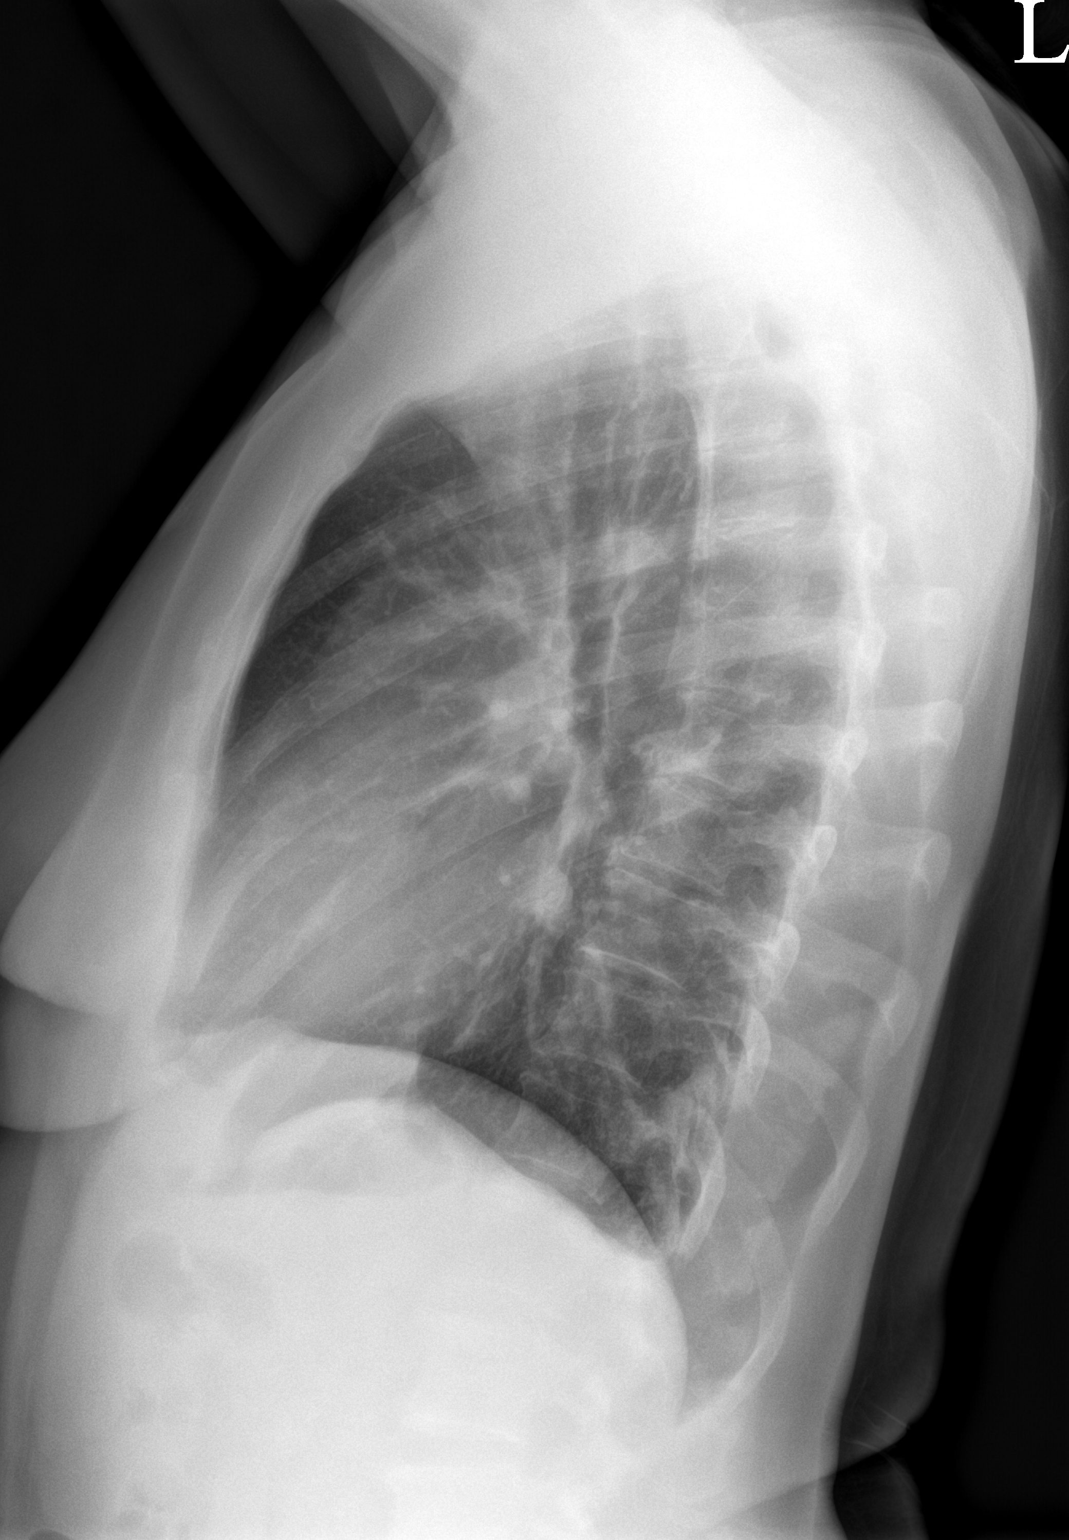

[2 of 2 positions shown; findings below may reference images not displayed]

FINDINGS: The heart size and mediastinal contours are within normal limits.
Both lungs are clear. The visualized skeletal structures are
unremarkable.
IMPRESSION: No active cardiopulmonary disease.

## 2023-09-27 ENCOUNTER — Other Ambulatory Visit: Payer: Self-pay | Admitting: Emergency Medicine

## 2023-09-27 DIAGNOSIS — F419 Anxiety disorder, unspecified: Secondary | ICD-10-CM

## 2023-09-27 DIAGNOSIS — F418 Other specified anxiety disorders: Secondary | ICD-10-CM

## 2023-10-04 ENCOUNTER — Ambulatory Visit: Payer: Self-pay

## 2023-10-04 NOTE — Telephone Encounter (Signed)
 FYI Only or Action Required?: FYI only for provider.  Patient was last seen in primary care on 07/15/2023 by Purcell Emil Schanz, MD.  Called Nurse Triage reporting Eye Drainage.  Symptoms began several days ago.  Interventions attempted: Nothing.  Symptoms are: gradually worsening.  Triage Disposition: See Physician Within 24 Hours  No appts to [facility]  Patient/caregiver understands and will follow disposition?:               Copied from CRM #[id number]. Topic: Clinical - Red Word Triage >> Oct 04, 2023  9:53 AM Mia F wrote: Red Word that prompted transfer to Nurse Triage: Swollen eyes. Both eyes are swollen and has been swollen since Saturday. No pain and do have some blurriness. There is yellow sticky fluid coming out of eyes. Reason for Disposition  [1] Lots of yellow or green nasal discharge AND [2] present now AND [3] fever  Answer Assessment - Initial Assessment Questions 1. EYE DISCHARGE: Is the discharge in one or both eyes? What color is it? How much is there? When did the discharge start?      Both  2. REDNESS OF SCLERA: Is there redness in the white of the eye? If Yes, ask: Is it in one or both eyes? When did the redness start?     *No Answer* 3. EYELIDS: Are the eyelids red or swollen? If Yes, ask: How much?      swollen 4. VISION: Do you have blurred vision?     Yes  5. PAIN: Is there any pain? If Yes, ask: How bad is the pain? (Scale 0-10; or none, mild, moderate, severe)     no 6. CONTACT LENS: Do you wear contacts?     no 7. OTHER SYMPTOMS: Do you have any other symptoms? (e.g., fever, runny nose, cough)     Itch  Protocols used: Eye - Pus or Discharge-A-AH

## 2023-10-04 NOTE — Telephone Encounter (Signed)
 Copied from CRM [id number]. Topic: General - Other >> Oct 04, 2023 11:53 AM Jayma L wrote: Reason for CRM: patient was previously nurse triaged and was told [facility] and when she showed up it wasn't were she thought she needed to go. Patient asked I call nurse back and get correct location. Advised patient she was at correct location and she thought she was going to a bus. When I called nurse triage back they said she could go to a urgent care and be seen. As I was getting information on urgent cares patient disconnected line.

## 2023-10-07 ENCOUNTER — Ambulatory Visit: Payer: Self-pay

## 2023-10-07 NOTE — Telephone Encounter (Signed)
 FYI Only or Action Required?: FYI only for provider.  Patient was last seen in primary care on 07/15/2023 by Purcell Emil Schanz, MD.  Called Nurse Triage reporting Hypertension.  Symptoms began 2 days ago.  Interventions attempted: Rest, hydration, or home remedies.  Symptoms are: unchanged.  Triage Disposition: See PCP When Office is Open (Within 3 Days), See PCP Within 2 Weeks  Patient/caregiver understands and will follow disposition?:   Copied from CRM [id number]. Topic: Clinical - Red Word Triage >> Oct 07, 2023  4:55 PM Deleta RAMAN wrote: Red Word that prompted transfer to Nurse Triage: patient has been experiencing high blood pressure, light headed, and dizziness Reason for Disposition  [1] Systolic BP >= 130 OR Diastolic >= 80 AND [2] not taking BP medications  [1] MILD dizziness (e.g., walking normally) AND [2] has NOT been evaluated by doctor (or NP/PA) for this  (Exception: Dizziness caused by heat exposure, sudden standing, or poor fluid intake.)  Answer Assessment - Initial Assessment Questions 1. DESCRIPTION: Describe your dizziness.     Patient reports lightheadedness 2. LIGHTHEADED: Do you feel lightheaded? (e.g., somewhat faint, woozy, weak upon standing)     yes 3. VERTIGO: Do you feel like either you or the room is spinning or tilting? (i.e., vertigo)     no 4. SEVERITY: How bad is it?  Do you feel like you are going to faint? Can you stand and walk?     mild 5. ONSET:  When did the dizziness begin?     Started 2 days ago 6. AGGRAVATING FACTORS: Does anything make it worse? (e.g., standing, change in head position)     no 7. HEART RATE: Can you tell me your heart rate? How many beats in 15 seconds?  (Note: Not all patients can do this.)       60 8. CAUSE: What do you think is causing the dizziness? (e.g., decreased fluids or food, diarrhea, emotional distress, heat exposure, new medicine, sudden standing, vomiting; unknown)     unsure 9.  RECURRENT SYMPTOM: Have you had dizziness before? If Yes, ask: When was the last time? What happened that time?     no 10. OTHER SYMPTOMS: Do you have any other symptoms? (e.g., fever, chest pain, vomiting, diarrhea, bleeding)       no  Answer Assessment - Initial Assessment Questions 1. BLOOD PRESSURE: What is your blood pressure? Did you take at least two measurements 5 minutes apart?     147/74, 144/72 2. ONSET: When did you take your blood pressure?     This morning 3. HOW: How did you take your blood pressure? (e.g., automatic home BP monitor, visiting nurse)     Automatic home BP monitor 4. HISTORY: Do you have a history of high blood pressure?     yes 5. MEDICINES: Are you taking any medicines for blood pressure? Have you missed any doses recently?    Patient not on medication 6. OTHER SYMPTOMS: Do you have any symptoms? (e.g., blurred vision, chest pain, difficulty breathing, headache, weakness)     Blurred vision at times, dizziness.  Protocols used: Blood Pressure - High-A-AH, Dizziness - Lightheadedness-A-AH

## 2023-10-08 NOTE — Telephone Encounter (Signed)
 LVM for patient to call back regarding her eye. Pt has appt 10/11/2023

## 2023-10-11 ENCOUNTER — Ambulatory Visit: Payer: Self-pay | Admitting: Emergency Medicine

## 2023-10-11 ENCOUNTER — Ambulatory Visit (INDEPENDENT_AMBULATORY_CARE_PROVIDER_SITE_OTHER): Admitting: Emergency Medicine

## 2023-10-11 ENCOUNTER — Encounter: Payer: Self-pay | Admitting: Emergency Medicine

## 2023-10-11 VITALS — BP 124/68 | HR 66 | Temp 97.9°F | Ht 60.0 in | Wt 141.0 lb

## 2023-10-11 DIAGNOSIS — I10 Essential (primary) hypertension: Secondary | ICD-10-CM | POA: Diagnosis not present

## 2023-10-11 DIAGNOSIS — F32A Depression, unspecified: Secondary | ICD-10-CM

## 2023-10-11 DIAGNOSIS — E785 Hyperlipidemia, unspecified: Secondary | ICD-10-CM

## 2023-10-11 DIAGNOSIS — R42 Dizziness and giddiness: Secondary | ICD-10-CM | POA: Insufficient documentation

## 2023-10-11 DIAGNOSIS — F419 Anxiety disorder, unspecified: Secondary | ICD-10-CM | POA: Diagnosis not present

## 2023-10-11 LAB — COMPREHENSIVE METABOLIC PANEL WITH GFR
ALT: 20 U/L (ref 0–35)
AST: 19 U/L (ref 0–37)
Albumin: 4.1 g/dL (ref 3.5–5.2)
Alkaline Phosphatase: 75 U/L (ref 39–117)
BUN: 16 mg/dL (ref 6–23)
CO2: 29 meq/L (ref 19–32)
Calcium: 9.3 mg/dL (ref 8.4–10.5)
Chloride: 102 meq/L (ref 96–112)
Creatinine, Ser: 0.79 mg/dL (ref 0.40–1.20)
GFR: 77.16 mL/min (ref >60.00)
Glucose, Bld: 108 mg/dL — ABNORMAL HIGH (ref 70–99)
Potassium: 4.4 meq/L (ref 3.5–5.1)
Sodium: 138 meq/L (ref 135–145)
Total Bilirubin: 0.3 mg/dL (ref 0.2–1.2)
Total Protein: 7.1 g/dL (ref 6.0–8.3)

## 2023-10-11 LAB — CBC WITH DIFFERENTIAL/PLATELET
Basophils Absolute: 0 K/uL (ref 0.0–0.1)
Basophils Relative: 0.5 % (ref 0.0–3.0)
Eosinophils Absolute: 0 K/uL (ref 0.0–0.7)
Eosinophils Relative: 0.9 % (ref 0.0–5.0)
HCT: 40.7 % (ref 36.0–46.0)
Hemoglobin: 13.6 g/dL (ref 12.0–15.0)
Lymphocytes Relative: 33.9 % (ref 12.0–46.0)
Lymphs Abs: 1.7 K/uL (ref 0.7–4.0)
MCHC: 33.5 g/dL (ref 30.0–36.0)
MCV: 85.8 fl (ref 78.0–100.0)
Monocytes Absolute: 0.6 K/uL (ref 0.1–1.0)
Monocytes Relative: 11.2 % (ref 3.0–12.0)
Neutro Abs: 2.6 K/uL (ref 1.4–7.7)
Neutrophils Relative %: 53.5 % (ref 43.0–77.0)
Platelets: 225 K/uL (ref 150.0–400.0)
RBC: 4.74 Mil/uL (ref 3.87–5.11)
RDW: 12.7 % (ref 11.5–15.5)
WBC: 4.9 K/uL (ref 4.0–10.5)

## 2023-10-11 NOTE — Patient Instructions (Signed)
 Health Maintenance After Age 68 After age 4, you are at a higher risk for certain long-term diseases and infections as well as injuries from falls. Falls are a major cause of broken bones and head injuries in people who are older than age 47. Getting regular preventive care can help to keep you healthy and well. Preventive care includes getting regular testing and making lifestyle changes as recommended by your health care provider. Talk with your health care provider about: Which screenings and tests you should have. A screening is a test that checks for a disease when you have no symptoms. A diet and exercise plan that is right for you. What should I know about screenings and tests to prevent falls? Screening and testing are the best ways to find a health problem early. Early diagnosis and treatment give you the best chance of managing medical conditions that are common after age 37. Certain conditions and lifestyle choices may make you more likely to have a fall. Your health care provider may recommend: Regular vision checks. Poor vision and conditions such as cataracts can make you more likely to have a fall. If you wear glasses, make sure to get your prescription updated if your vision changes. Medicine review. Work with your health care provider to regularly review all of the medicines you are taking, including over-the-counter medicines. Ask your health care provider about any side effects that may make you more likely to have a fall. Tell your health care provider if any medicines that you take make you feel dizzy or sleepy. Strength and balance checks. Your health care provider may recommend certain tests to check your strength and balance while standing, walking, or changing positions. Foot health exam. Foot pain and numbness, as well as not wearing proper footwear, can make you more likely to have a fall. Screenings, including: Osteoporosis screening. Osteoporosis is a condition that causes  the bones to get weaker and break more easily. Blood pressure screening. Blood pressure changes and medicines to control blood pressure can make you feel dizzy. Depression screening. You may be more likely to have a fall if you have a fear of falling, feel depressed, or feel unable to do activities that you used to do. Alcohol use screening. Using too much alcohol can affect your balance and may make you more likely to have a fall. Follow these instructions at home: Lifestyle Do not drink alcohol if: Your health care provider tells you not to drink. If you drink alcohol: Limit how much you have to: 0-1 drink a day for women. 0-2 drinks a day for men. Know how much alcohol is in your drink. In the [place] one drink equals one 12 oz bottle of beer (355 mL), one 5 oz glass of wine (148 mL), or one 1 oz glass of hard liquor (44 mL). Do not use any products that contain nicotine or tobacco. These products include cigarettes, chewing tobacco, and vaping devices, such as e-cigarettes. If you need help quitting, ask your health care provider. Activity  Follow a regular exercise program to stay fit. This will help you maintain your balance. Ask your health care provider what types of exercise are appropriate for you. If you need a cane or walker, use it as recommended by your health care provider. Wear supportive shoes that have nonskid soles. Safety  Remove any tripping hazards, such as rugs, cords, and clutter. Install safety equipment such as grab bars in bathrooms and safety rails on stairs. Keep rooms and walkways  well-lit. General instructions Talk with your health care provider about your risks for falling. Tell your health care provider if: You fall. Be sure to tell your health care provider about all falls, even ones that seem minor. You feel dizzy, tiredness (fatigue), or off-balance. Take over-the-counter and prescription medicines only as told by your health care provider. These include  supplements. Eat a healthy diet and maintain a healthy weight. A healthy diet includes low-fat dairy products, low-fat (lean) meats, and fiber from whole grains, beans, and lots of fruits and vegetables. Stay current with your vaccines. Schedule regular health, dental, and eye exams. Summary Having a healthy lifestyle and getting preventive care can help to protect your health and wellness after age 11. Screening and testing are the best way to find a health problem early and help you avoid having a fall. Early diagnosis and treatment give you the best chance for managing medical conditions that are more common for people who are older than age 28. Falls are a major cause of broken bones and head injuries in people who are older than age 48. Take precautions to prevent a fall at home. Work with your health care provider to learn what changes you can make to improve your health and wellness and to prevent falls. This information is not intended to replace advice given to you by your health care provider. Make sure you discuss any questions you have with your health care provider. Document Revised: 07/15/2020 Document Reviewed: 07/15/2020 Elsevier Patient Education  2024 [employer].

## 2023-10-11 NOTE — Assessment & Plan Note (Signed)
Currently stable and well-controlled Continues Prozac 10 mg daily and alprazolam 0.5 mg as needed Stress management discussed

## 2023-10-11 NOTE — Assessment & Plan Note (Signed)
 BP Readings from Last 3 Encounters:  10/11/23 124/68  07/26/23 130/60  07/15/23 120/76  Well-controlled hypertension Continue lisinopril  10 mg daily Cardiovascular risks associated with hypertension discussed Dietary approaches to stop hypertension discussed

## 2023-10-11 NOTE — Assessment & Plan Note (Signed)
Chronic stable condition.  Continue rosuvastatin 10 mg daily. Diet and nutrition discussed. 

## 2023-10-11 NOTE — Progress Notes (Signed)
 Katie Gardner 68 y.o.   Chief Complaint  Patient presents with   Dizziness    Patient states the dizziness started 3 days ago. She mainly notices it in the mornings. No blurred vision, no head aches, slight off balance. She checked her bp when she has these dizzy spells and states the late time was 116/ 15. Patient states she was taking dramamine to help and states it helped slightly     HISTORY OF PRESENT ILLNESS: This is a 68 y.o. female complaining of a couple of dizzy spell episodes last week.  Better today. No other associated symptoms.  Took Dramamine with relief. No other complaints or medical concerns today  Dizziness Pertinent negatives include no abdominal pain, chest pain, chills, congestion, coughing, fever, nausea, rash, sore throat or vomiting.     [name]   Medication Sig Start Date End Date Taking? Authorizing Provider  ALPRAZolam  (XANAX ) 0.5 MG tablet TAKE 1 TABLET BY MOUTH EVERY DAY AS NEEDED FOR ANXIETY 09/28/23  Yes Edger Husain, Emil Schanz, MD  cyclobenzaprine  (FLEXERIL ) 5 MG tablet TAKE 1 TABLET(5 MG) BY MOUTH THREE TIMES DAILY AS NEEDED FOR MUSCLE SPASMS 09/21/22  Yes Romulo Okray, Emil Schanz, MD  FLUoxetine  (PROZAC ) 10 MG tablet Take 1 tablet (10 mg total) by mouth daily. 05/04/22  Yes Jagger Beahm, Emil Schanz, MD  fluticasone  (FLONASE ) 50 MCG/ACT nasal spray SHAKE LIQUID AND USE 2 SPRAYS IN EACH NOSTRIL DAILY 06/25/22  Yes Henson, Vickie L, NP-C  hydrOXYzine  (ATARAX ) 25 MG tablet Take 1 tablet (25 mg total) by mouth at bedtime as needed. 07/15/23  Yes Raun Routh, Emil Schanz, MD  lisinopril  (ZESTRIL ) 10 MG tablet TAKE 1 TABLET(10 MG) BY MOUTH DAILY 11/15/21  Yes Anber Mckiver, Emil Schanz, MD  loratadine  (CLARITIN ) 10 MG tablet Take 1 tablet (10 mg total) by mouth daily. 09/12/18  Yes Johnhenry Tippin, Emil Schanz, MD  meloxicam  (MOBIC ) 15 MG tablet TAKE 1 TABLET(15 MG) BY MOUTH DAILY FOR 10 DAYS 07/09/23  Yes Shalina Norfolk, Emil Schanz, MD  Olopatadine  HCl 0.2 % SOLN Apply 2  drops to eye every 6 (six) hours as needed. 06/23/23  Yes Merleen Picazo, Emil Schanz, MD  rosuvastatin  (CRESTOR ) 10 MG tablet Take 1 tablet (10 mg total) by mouth daily. 07/22/23  Yes Seema Blum, Emil Schanz, MD  clotrimazole -betamethasone  (LOTRISONE ) cream Apply 1 Application topically daily. Patient not taking: Reported on 10/11/2023 02/15/23   Purcell Emil Schanz, MD    Allergies  Allergen Reactions   Macrobid  [Nitrofurantoin ] Nausea And Vomiting   Hydrocodone Nausea And Vomiting    dizzy   Mucinex Dm [Dm-Guaifenesin Er] Swelling    Sob, throat closing    Patient Active Problem List   Diagnosis Date Noted   Tiredness 06/09/2022   Suspected sleep apnea 06/09/2022   Urinary incontinence without sensory awareness 03/05/2022   Stress incontinence in female 02/17/2022   Skin tags, multiple acquired 07/27/2019   Chronic insomnia 05/31/2019   Essential hypertension 12/17/2017   Seasonal allergies 12/17/2017   Anxiety and depression 11/13/2016   Chronic anxiety 11/13/2016   Hot flashes    Situational anxiety    Dyslipidemia     Past Medical History:  Diagnosis Date   Allergy    seasonal   Anxiety    Depression    Family history of adverse reaction to anesthesia    sister hard to wake up   Hematuria    Hot flashes    Hx of cardiovascular stress test    Lex MV 12/13:  EF 68%, mild apical  thinning, no ischemia   Hyperlipidemia    Hypertension    PONV (postoperative nausea and vomiting)     Past Surgical History:  Procedure Laterality Date   BREAST LUMPECTOMY     right breast, not cancer   Carpel tunnel     ECTOPIC PREGNANCY SURGERY      Social History   Socioeconomic History   Marital status: Divorced    Spouse name: Not on file   Number of children: 1   Years of education: 8th grade   Highest education level: Not on file  Occupational History    Employer: UNEMPLOYED  Tobacco Use   Smoking status: Former    Current packs/day: 0.00    Average packs/day: 1 pack/day  for 6.0 years (6.0 ttl pk-yrs)    Types: Cigarettes    Start date: 03/09/1974    Quit date: 03/09/1980    Years since quitting: 43.6    Passive exposure: Past   Smokeless tobacco: Never  Vaping Use   Vaping status: Never Used  Substance and Sexual Activity   Alcohol use: Not Currently   Drug use: No   Sexual activity: Yes  Other Topics Concern   Not on file  Social History Narrative   Caffiene coffee 1 cup   Live alone.   Work retired   Teacher, early years/pre Strain: Low Risk  (06/07/2023)   Overall Financial Resource Strain (CARDIA)    Difficulty of Paying Living Expenses: Not hard at all  Food Insecurity: No Food Insecurity (06/07/2023)   Hunger Vital Sign    Worried About Running Out of Food in the Last Year: Never true    Ran Out of Food in the Last Year: Never true  Transportation Needs: No Transportation Needs (06/07/2023)   PRAPARE - Administrator, Civil Service (Medical): No    Lack of Transportation (Non-Medical): No  Physical Activity: Inactive (06/07/2023)   Exercise Vital Sign    Days of Exercise per Week: 0 days    Minutes of Exercise per Session: 0 min  Stress: Stress Concern Present (06/07/2023)   [employer] of Occupational Health - Occupational Stress Questionnaire    Feeling of Stress : Rather much  Social Connections: Socially Isolated (06/07/2023)   Social Connection and Isolation Panel    Frequency of Communication with Friends and Family: More than three times a week    Frequency of Social Gatherings with Friends and Family: Never    Attends Religious Services: Never    Database administrator or Organizations: No    Attends Banker Meetings: Never    Marital Status: Divorced  Catering manager Violence: Not At Risk (06/07/2023)   Humiliation, Afraid, Rape, and Kick questionnaire    Fear of Current or Ex-Partner: No    Emotionally Abused: No    Physically Abused: No    Sexually Abused: No    Family  History  Problem Relation Age of Onset   Heart disease Mother    Diabetes Mother    Esophageal cancer Father        52's   Diabetes Sister    Diabetes Sister    Colon cancer Neg Hx      Review of Systems  Constitutional: Negative.  Negative for chills and fever.  HENT: Negative.  Negative for congestion and sore throat.   Respiratory: Negative.  Negative for cough and shortness of breath.   Cardiovascular: Negative.  Negative for chest pain and  palpitations.  Gastrointestinal:  Negative for abdominal pain, diarrhea, nausea and vomiting.  Genitourinary: Negative.  Negative for dysuria and hematuria.  Musculoskeletal: Negative.   Skin: Negative.  Negative for rash.  Neurological:  Positive for dizziness.  All other systems reviewed and are negative.   Vitals:   10/11/23 1003  BP: 124/68  Pulse: 66  Temp: 97.9 F (36.6 C)  SpO2: 97%    Physical Exam Vitals reviewed.  Constitutional:      Appearance: Normal appearance.  HENT:     Head: Normocephalic.     Mouth/Throat:     Mouth: Mucous membranes are moist.     Pharynx: Oropharynx is clear.  Eyes:     Extraocular Movements: Extraocular movements intact.     Pupils: Pupils are equal, round, and reactive to light.  Cardiovascular:     Rate and Rhythm: Normal rate and regular rhythm.     Pulses: Normal pulses.     Heart sounds: Normal heart sounds.  Pulmonary:     Effort: Pulmonary effort is normal.     Breath sounds: Normal breath sounds.  Musculoskeletal:     Cervical back: No tenderness.  Lymphadenopathy:     Cervical: No cervical adenopathy.  Skin:    General: Skin is warm and dry.     Capillary Refill: Capillary refill takes less than 2 seconds.  Neurological:     General: No focal deficit present.     Mental Status: She is alert and oriented to person, place, and time.  Psychiatric:        Mood and Affect: Mood normal.        Behavior: Behavior normal.      ASSESSMENT & PLAN: A total of 43 minutes  was spent with the patient and counseling/coordination of care regarding preparing for this visit, review of most recent office visit notes, review of multiple chronic medical conditions and their management, differential diagnosis of dizziness, need for blood work today, review of all medications, review of most recent bloodwork results, review of health maintenance items, mental health and management, education on nutrition, prognosis, documentation, and need for follow up.   Problem List Items Addressed This Visit       Cardiovascular and Mediastinum   Essential hypertension   BP Readings from Last 3 Encounters:  10/11/23 124/68  07/26/23 130/60  07/15/23 120/76  Well-controlled hypertension Continue lisinopril  10 mg daily Cardiovascular risks associated with hypertension discussed Dietary approaches to stop hypertension discussed         Other   Dyslipidemia   Chronic stable condition Continue rosuvastatin  10 mg daily Diet and nutrition discussed      Anxiety and depression   Currently stable and well-controlled Continues Prozac  10 mg daily and alprazolam  0.5 mg as needed Stress management discussed      Dizziness - Primary   Clinically stable.  No red flag signs or symptoms. Differential diagnosis discussed. Unremarkable examination. Recommend blood work today Advised to rest and stay well-hydrated. Advised to contact the office if no better or worse during the next several days.      Relevant Orders   Comprehensive metabolic panel with GFR   CBC with Differential/Platelet   Patient Instructions  Health Maintenance After Age 76 After age 93, you are at a higher risk for certain long-term diseases and infections as well as injuries from falls. Falls are a major cause of broken bones and head injuries in people who are older than age 52. Getting regular preventive care  can help to keep you healthy and well. Preventive care includes getting regular testing and making  lifestyle changes as recommended by your health care provider. Talk with your health care provider about: Which screenings and tests you should have. A screening is a test that checks for a disease when you have no symptoms. A diet and exercise plan that is right for you. What should I know about screenings and tests to prevent falls? Screening and testing are the best ways to find a health problem early. Early diagnosis and treatment give you the best chance of managing medical conditions that are common after age 24. Certain conditions and lifestyle choices may make you more likely to have a fall. Your health care provider may recommend: Regular vision checks. Poor vision and conditions such as cataracts can make you more likely to have a fall. If you wear glasses, make sure to get your prescription updated if your vision changes. Medicine review. Work with your health care provider to regularly review all of the medicines you are taking, including over-the-counter medicines. Ask your health care provider about any side effects that may make you more likely to have a fall. Tell your health care provider if any medicines that you take make you feel dizzy or sleepy. Strength and balance checks. Your health care provider may recommend certain tests to check your strength and balance while standing, walking, or changing positions. Foot health exam. Foot pain and numbness, as well as not wearing proper footwear, can make you more likely to have a fall. Screenings, including: Osteoporosis screening. Osteoporosis is a condition that causes the bones to get weaker and break more easily. Blood pressure screening. Blood pressure changes and medicines to control blood pressure can make you feel dizzy. Depression screening. You may be more likely to have a fall if you have a fear of falling, feel depressed, or feel unable to do activities that you used to do. Alcohol use screening. Using too much alcohol can  affect your balance and may make you more likely to have a fall. Follow these instructions at home: Lifestyle Do not drink alcohol if: Your health care provider tells you not to drink. If you drink alcohol: Limit how much you have to: 0-1 drink a day for women. 0-2 drinks a day for men. Know how much alcohol is in your drink. In the [place] one drink equals one 12 oz bottle of beer (355 mL), one 5 oz glass of wine (148 mL), or one 1 oz glass of hard liquor (44 mL). Do not use any products that contain nicotine or tobacco. These products include cigarettes, chewing tobacco, and vaping devices, such as e-cigarettes. If you need help quitting, ask your health care provider. Activity  Follow a regular exercise program to stay fit. This will help you maintain your balance. Ask your health care provider what types of exercise are appropriate for you. If you need a cane or walker, use it as recommended by your health care provider. Wear supportive shoes that have nonskid soles. Safety  Remove any tripping hazards, such as rugs, cords, and clutter. Install safety equipment such as grab bars in bathrooms and safety rails on stairs. Keep rooms and walkways well-lit. General instructions Talk with your health care provider about your risks for falling. Tell your health care provider if: You fall. Be sure to tell your health care provider about all falls, even ones that seem minor. You feel dizzy, tiredness (fatigue), or off-balance. Take over-the-counter and  prescription medicines only as told by your health care provider. These include supplements. Eat a healthy diet and maintain a healthy weight. A healthy diet includes low-fat dairy products, low-fat (lean) meats, and fiber from whole grains, beans, and lots of fruits and vegetables. Stay current with your vaccines. Schedule regular health, dental, and eye exams. Summary Having a healthy lifestyle and getting preventive care can help to protect  your health and wellness after age 65. Screening and testing are the best way to find a health problem early and help you avoid having a fall. Early diagnosis and treatment give you the best chance for managing medical conditions that are more common for people who are older than age 68. Falls are a major cause of broken bones and head injuries in people who are older than age 75. Take precautions to prevent a fall at home. Work with your health care provider to learn what changes you can make to improve your health and wellness and to prevent falls. This information is not intended to replace advice given to you by your health care provider. Make sure you discuss any questions you have with your health care provider. Document Revised: 07/15/2020 Document Reviewed: 07/15/2020 Elsevier Patient Education  2024 Elsevier Inc.    Emil Schaumann, MD [facility]

## 2023-10-11 NOTE — Assessment & Plan Note (Signed)
 Clinically stable.  No red flag signs or symptoms. Differential diagnosis discussed. Unremarkable examination. Recommend blood work today Advised to rest and stay well-hydrated. Advised to contact the office if no better or worse during the next several days.

## 2023-10-12 NOTE — Telephone Encounter (Signed)
 Recommend follow-up with orthopedist.  Place referral if needed.  Thanks.

## 2023-11-13 ENCOUNTER — Other Ambulatory Visit: Payer: Self-pay | Admitting: Emergency Medicine

## 2023-11-13 DIAGNOSIS — M25519 Pain in unspecified shoulder: Secondary | ICD-10-CM

## 2023-11-16 ENCOUNTER — Other Ambulatory Visit: Payer: Self-pay | Admitting: Emergency Medicine

## 2023-11-16 ENCOUNTER — Ambulatory Visit: Payer: Self-pay

## 2023-11-16 DIAGNOSIS — F419 Anxiety disorder, unspecified: Secondary | ICD-10-CM

## 2023-11-16 DIAGNOSIS — F418 Other specified anxiety disorders: Secondary | ICD-10-CM

## 2023-11-16 DIAGNOSIS — F32A Depression, unspecified: Secondary | ICD-10-CM

## 2023-11-16 DIAGNOSIS — M25519 Pain in unspecified shoulder: Secondary | ICD-10-CM

## 2023-11-16 DIAGNOSIS — F5104 Psychophysiologic insomnia: Secondary | ICD-10-CM

## 2023-11-16 NOTE — Telephone Encounter (Unsigned)
 Copied from CRM [id number]. Topic: Clinical - Medication Refill >> Nov 16, 2023  2:56 PM Paige D wrote: Medication:  FLUoxetine  (PROZAC ) 10 MG table  cyclobenzaprine  (FLEXERIL ) 5 MG tablet  loratadine  (CLARITIN ) 10 MG tablet  ALPRAZolam  (XANAX ) 0.5 MG tablet  hydrOXYzine  (ATARAX ) 25 MG tablet  Ibuprofen  600 MG       Has the patient contacted their pharmacy? Yes (Agent: If no, request that the patient contact the pharmacy for the refill. If patient does not wish to contact the pharmacy document the reason why and proceed with request.) (Agent: If yes, when and what did the pharmacy advise?)  This is the patient's preferred pharmacy:  [id number] [place], [place] - [address] AT [facility] [address] [place] [address] Phone: [phone] Fax: [phone]  Is this the correct pharmacy for this prescription? Yes If no, delete pharmacy and type the correct one.   Has the prescription been filled recently? Yes, last month   Is the patient out of the medication? Yes  Has the patient been seen for an appointment in the last year OR does the patient have an upcoming appointment? Yes  Can we respond through MyChart? Yes  Agent: Please be advised that Rx refills may take up to 3 business days. We ask that you follow-up with your pharmacy.

## 2023-11-16 NOTE — Telephone Encounter (Signed)
 FYI Only or Action Required?: FYI only for provider.  Patient was last seen in primary care on 10/11/2023 by Purcell Emil Schanz, MD.  Called Nurse Triage reporting Knee Pain.  Symptoms began several days ago.  Interventions attempted: OTC medications: Bengay cream and ibuprofen .  Symptoms are: gradually worsening.  Triage Disposition: See PCP When Office is Open (Within 3 Days)  Patient/caregiver understands and will follow disposition?: Yes                             Copied from CRM #[id number]. Topic: Clinical - Red Word Triage >> Nov 16, 2023  3:02 PM Paige D wrote: Red Word that prompted transfer to Nurse Triage: pt states she is getting sharp pain in the left knee and the pain gets so bad that she some times has to jump due to it feeling like her bone is going to break. Reason for Disposition  [1] MODERATE pain (e.g., interferes with normal activities, limping) AND [2] present > 3 days  Answer Assessment - Initial Assessment Questions 1. LOCATION and RADIATION: Where is the pain located?      Localized to left knee 2. QUALITY: What does the pain feel like?  (e.g., sharp, dull, aching, burning)     Sharp pain that makes her jump, states something feels out of place when pain happens 3. SEVERITY: How bad is the pain? What does it keep you from doing?   (Scale 1-10; or mild, moderate, severe)     States pain can be an 8 at times, but goes away quickly 4. ONSET: When did the pain start? Does it come and go, or is it there all the time?     2-3 days ago, on and off 6. SETTING: Has there been any recent work, exercise or other activity that involved that part of the body?      Denies 7. AGGRAVATING FACTORS: What makes the knee pain worse? (e.g., walking, climbing stairs, running)     Denies 8. ASSOCIATED SYMPTOMS: Is there any swelling or redness of the knee?     Mild swelling, denies redness  9. OTHER SYMPTOMS: Do you have any other  symptoms? (e.g., calf pain, chest pain, difficulty breathing, fever)     Denies chest pain, denies difficulty breathing, denies fever  Protocols used: Knee Pain-A-AH

## 2023-11-18 ENCOUNTER — Ambulatory Visit (INDEPENDENT_AMBULATORY_CARE_PROVIDER_SITE_OTHER)

## 2023-11-18 ENCOUNTER — Ambulatory Visit: Payer: Self-pay | Admitting: Emergency Medicine

## 2023-11-18 ENCOUNTER — Ambulatory Visit (INDEPENDENT_AMBULATORY_CARE_PROVIDER_SITE_OTHER): Admitting: Emergency Medicine

## 2023-11-18 ENCOUNTER — Telehealth: Payer: Self-pay | Admitting: Radiology

## 2023-11-18 ENCOUNTER — Other Ambulatory Visit: Payer: Self-pay | Admitting: Emergency Medicine

## 2023-11-18 ENCOUNTER — Encounter: Payer: Self-pay | Admitting: Emergency Medicine

## 2023-11-18 VITALS — BP 140/74 | HR 69 | Temp 98.1°F | Ht 60.0 in | Wt 141.0 lb

## 2023-11-18 DIAGNOSIS — G8929 Other chronic pain: Secondary | ICD-10-CM

## 2023-11-18 DIAGNOSIS — E785 Hyperlipidemia, unspecified: Secondary | ICD-10-CM

## 2023-11-18 DIAGNOSIS — F5104 Psychophysiologic insomnia: Secondary | ICD-10-CM

## 2023-11-18 DIAGNOSIS — J302 Other seasonal allergic rhinitis: Secondary | ICD-10-CM | POA: Diagnosis not present

## 2023-11-18 DIAGNOSIS — F419 Anxiety disorder, unspecified: Secondary | ICD-10-CM | POA: Diagnosis not present

## 2023-11-18 DIAGNOSIS — I10 Essential (primary) hypertension: Secondary | ICD-10-CM

## 2023-11-18 DIAGNOSIS — F32A Depression, unspecified: Secondary | ICD-10-CM

## 2023-11-18 DIAGNOSIS — M25562 Pain in left knee: Secondary | ICD-10-CM | POA: Diagnosis not present

## 2023-11-18 DIAGNOSIS — M25519 Pain in unspecified shoulder: Secondary | ICD-10-CM

## 2023-11-18 MED ORDER — HYDROXYZINE HCL 25 MG PO TABS: 25.0000 mg | ORAL_TABLET | Freq: Every evening | ORAL | 1 refills | Status: DC | PRN

## 2023-11-18 MED ORDER — LORATADINE 10 MG PO TABS: 10.0000 mg | ORAL_TABLET | Freq: Every day | ORAL | 11 refills | Status: DC

## 2023-11-18 MED ORDER — ALPRAZOLAM 0.5 MG PO TABS: 0.5000 mg | ORAL_TABLET | Freq: Two times a day (BID) | ORAL | 1 refills | Status: DC | PRN

## 2023-11-18 MED ORDER — CYCLOBENZAPRINE HCL 5 MG PO TABS: 5.0000 mg | ORAL_TABLET | Freq: Three times a day (TID) | ORAL | 1 refills | Status: AC | PRN

## 2023-11-18 MED ORDER — LEVOCETIRIZINE DIHYDROCHLORIDE 5 MG PO TABS: 5.0000 mg | ORAL_TABLET | Freq: Every day | ORAL | 1 refills | Status: DC | PRN

## 2023-11-18 MED ORDER — FLUOXETINE HCL 10 MG PO TABS: 10.0000 mg | ORAL_TABLET | Freq: Every day | ORAL | 3 refills | Status: AC

## 2023-11-18 NOTE — Assessment & Plan Note (Signed)
 Chronic stable condition Continue rosuvastatin  10 mg daily Diet and nutrition discussed

## 2023-11-18 NOTE — Assessment & Plan Note (Signed)
 BP Readings from Last 3 Encounters:  11/18/23 (!) 140/74  10/11/23 124/68  07/26/23 130/60  Normal blood pressure readings at home Continue lisinopril  10 mg daily Cardiovascular risks associated with hypertension discussed Dietary approaches to stop hypertension discussed

## 2023-11-18 NOTE — Assessment & Plan Note (Signed)
 Active and affecting quality of life Zyrtec  not working very well Recommend Xyzal  5 mg daily as needed

## 2023-11-18 NOTE — Assessment & Plan Note (Addendum)
 Unremarkable knee examination today Pain management discussed X-ray done today shows findings of osteoarthritis Recommend follow-up with orthopedist

## 2023-11-18 NOTE — Patient Instructions (Signed)
 Acute Knee Pain, Adult Many things can cause knee pain. Sometimes, knee pain is sudden (acute). It may be caused by damage, swelling, or irritation of the muscles and tissues that support your knee. Pain may come from: A fall. An injury to the knee from twisting motions. A hit to the knee. Infection. The pain often goes away on its own with time and rest. If the pain does not go away, tests may be done to find out what is causing the pain. These may include: Imaging tests, such as an X-ray, MRI, CT scan, or ultrasound. Joint aspiration. In this test, fluid is removed from the knee and checked. Arthroscopy. In this test, a lighted tube is put in the knee and an image is shown on a screen. A biopsy. In this test, a health care provider will remove a small piece of tissue for testing. Follow these instructions at home: If you have a knee sleeve or brace that can be taken off:  Wear the knee sleeve or brace as told by your provider. Take it off only if your provider says that you can. Check the skin around it every day. Tell your provider if you see problems. Loosen the knee sleeve or brace if your toes tingle, are numb, or turn cold and blue. Keep the knee sleeve or brace clean and dry. Bathing If the knee sleeve or brace is not waterproof: Do not let it get wet. Cover it when you take a bath or shower. Use a cover that does not let any water in. Managing pain, stiffness, and swelling  If told, put ice on the area. If you have a knee sleeve or brace that you can take off, remove it as told. Put ice in a plastic bag. Place a towel between your skin and the bag. Leave the ice on for 20 minutes, 2-3 times a day. If your skin turns bright red, take off the ice right away to prevent skin damage. The risk of damage is higher if you cannot feel pain, heat, or cold. Move your toes often to reduce stiffness and swelling. Raise the injured area above the level of your heart while you are sitting  or lying down. Use a pillow to support your foot as needed. If told, use an elastic bandage to put pressure (compression) on your injured knee. This may control swelling, give support, and help with discomfort. Sleep with a pillow under your knee. Activity Rest your knee. Do not do things that cause pain or make pain worse. Do not stand or walk on your injured knee until you're told it's okay. Use crutches as told. Avoid activities where both feet leave the ground at the same time and put stress on the joints. Avoid running, jumping rope, and doing jumping jacks. Work with a physical therapist to make a safe exercise program if told. Physical therapy helps your knee move better and get stronger. Exercise as told. General instructions Take your medicines only as told by your provider. If you are overweight, work with your provider and an expert in healthy eating, called a dietician, to set goals to lose weight. Being overweight can make your knee hurt more. Do not smoke, vape, or use products with nicotine or tobacco in them. If you need help quitting, talk with your provider. Return to normal activities when you are told. Ask what things are safe for you to do. Watch for any changes in your symptoms. Keep all follow-up visits. Your provider will check  your healing and adjust treatments if needed. Contact a health care provider if: The knee pain does not stop. The knee pain changes or gets worse. You have a fever along with knee pain. Your knee is red or feels warm when you touch it. Your knee gives out or locks up. Get help right away if: Your knee swells and the swelling gets worse. You cannot move your knee. You have very bad knee pain that does not get better with medicine. This information is not intended to replace advice given to you by your health care provider. Make sure you discuss any questions you have with your health care provider. Document Revised: 11/26/2022 Document  Reviewed: 04/20/2022 Elsevier Patient Education  2024 [employer].

## 2023-11-18 NOTE — Telephone Encounter (Signed)
 Copied from CRM #[id number]. Topic: Clinical - Medication Refill >> Nov 18, 2023 12:53 PM [place] M wrote: Medication: hydrOXYzine  (ATARAX ) 25 MG tablet; cyclobenzaprine  (FLEXERIL ) 5 MG tablet   Has the patient contacted their pharmacy? Yes (Agent: If no, request that the patient contact the pharmacy for the refill. If patient does not wish to contact the pharmacy document the reason why and proceed with request.) (Agent: If yes, when and what did the pharmacy advise?)  This is the patient's preferred pharmacy:  [id number] [address], [place] - [address] AT [facility] [address] [place] [address] Phone: [phone] Fax: [phone]  Is this the correct pharmacy for this prescription? Yes If no, delete pharmacy and type the correct one.   Has the prescription been filled recently? Yes  Is the patient out of the medication? Yes  Has the patient been seen for an appointment in the last year OR does the patient have an upcoming appointment? Yes  Can we respond through MyChart? Yes  Agent: Please be advised that Rx refills may take up to 3 business days. We ask that you follow-up with your pharmacy.

## 2023-11-18 NOTE — Assessment & Plan Note (Signed)
 Sleep hygiene measures discussed Suspected sleep apnea Recommend Vistaril 25 mg at bedtime

## 2023-11-18 NOTE — Progress Notes (Signed)
 Left knee pain left knee pain Katie Gardner 68 y.o.   Chief Complaint  Patient presents with   Knee Pain    Patient here for Left knee pain that comes and goes. States when she is walking she feels a sharp pain like her knee is about to break or come out of place. For about a week she's been taking ibuprofen  and ben gay on it, helps alittle.   Patient is also having bad allergies, itchy eye, ear, throat, and watery eyes. She has been taking zyrtec  is not helping     HISTORY OF PRESENT ILLNESS: This is a 68 y.o. female complaining of intermittent pain to left knee.  Sometimes it buckles and feels like it is coming out of place. Also complaining of seasonal allergies with itchy eyes and throat, watery eyes and a lot of runny nose.  Zyrtec  not helping. No other complaints or medical concerns today.  Knee Pain      [name]   Medication Sig Start Date End Date Taking? Authorizing Provider  ALPRAZolam  (XANAX ) 0.5 MG tablet TAKE 1 TABLET BY MOUTH EVERY DAY AS NEEDED FOR ANXIETY 09/28/23  Yes Liam Bossman, Emil Schanz, MD  cyclobenzaprine  (FLEXERIL ) 5 MG tablet TAKE 1 TABLET(5 MG) BY MOUTH THREE TIMES DAILY AS NEEDED FOR MUSCLE SPASMS 09/21/22  Yes Zarie Kosiba, Emil Schanz, MD  FLUoxetine  (PROZAC ) 10 MG tablet Take 1 tablet (10 mg total) by mouth daily. 05/04/22  Yes Dayson Aboud, Emil Schanz, MD  fluticasone  (FLONASE ) 50 MCG/ACT nasal spray SHAKE LIQUID AND USE 2 SPRAYS IN EACH NOSTRIL DAILY 06/25/22  Yes Henson, Vickie L, NP-C  hydrOXYzine  (ATARAX ) 25 MG tablet Take 1 tablet (25 mg total) by mouth at bedtime as needed. 07/15/23  Yes Takiera Mayo, Emil Schanz, MD  lisinopril  (ZESTRIL ) 10 MG tablet TAKE 1 TABLET(10 MG) BY MOUTH DAILY 11/15/21  Yes Jaiveer Panas, Emil Schanz, MD  loratadine  (CLARITIN ) 10 MG tablet Take 1 tablet (10 mg total) by mouth daily. 09/12/18  Yes Roshawn Lacina, Emil Schanz, MD  meloxicam  (MOBIC ) 15 MG tablet TAKE 1 TABLET(15 MG) BY MOUTH DAILY FOR 10 DAYS 07/09/23  Yes Prisha Hiley, Emil Schanz, MD  Olopatadine  HCl 0.2 % SOLN Apply 2 drops to eye every 6 (six) hours as needed. 06/23/23  Yes Merleen Picazo, Emil Schanz, MD  rosuvastatin  (CRESTOR ) 10 MG tablet Take 1 tablet (10 mg total) by mouth daily. 07/22/23  Yes Stuart Guillen, Emil Schanz, MD  clotrimazole -betamethasone  (LOTRISONE ) cream Apply 1 Application topically daily. Patient not taking: Reported on 11/18/2023 02/15/23   Purcell Emil Schanz, MD    Allergies  Allergen Reactions   Macrobid  [Nitrofurantoin ] Nausea And Vomiting   Hydrocodone Nausea And Vomiting    dizzy   Mucinex Dm [Dm-Guaifenesin Er] Swelling    Sob, throat closing    Patient Active Problem List   Diagnosis Date Noted   Dizziness 10/11/2023   Suspected sleep apnea 06/09/2022   Skin tags, multiple acquired 07/27/2019   Chronic insomnia 05/31/2019   Essential hypertension 12/17/2017   Anxiety and depression 11/13/2016   Chronic anxiety 11/13/2016   Hot flashes    Situational anxiety    Dyslipidemia     Past Medical History:  Diagnosis Date   Allergy    seasonal   Anxiety    Depression    Family history of adverse reaction to anesthesia    sister hard to wake up   Hematuria    Hot flashes    Hx of cardiovascular stress test    Lex MV  12/13:  EF 68%, mild apical thinning, no ischemia   Hyperlipidemia    Hypertension    PONV (postoperative nausea and vomiting)     Past Surgical History:  Procedure Laterality Date   BREAST LUMPECTOMY     right breast, not cancer   Carpel tunnel     ECTOPIC PREGNANCY SURGERY      Social History   Socioeconomic History   Marital status: Divorced    Spouse name: Not on file   Number of children: 1   Years of education: 8th grade   Highest education level: Not on file  Occupational History    Employer: UNEMPLOYED  Tobacco Use   Smoking status: Former    Current packs/day: 0.00    Average packs/day: 1 pack/day for 6.0 years (6.0 ttl pk-yrs)    Types: Cigarettes    Start date: 03/09/1974    Quit date:  03/09/1980    Years since quitting: 43.7    Passive exposure: Past   Smokeless tobacco: Never  Vaping Use   Vaping status: Never Used  Substance and Sexual Activity   Alcohol use: Not Currently   Drug use: No   Sexual activity: Yes  Other Topics Concern   Not on file  Social History Narrative   Caffiene coffee 1 cup   Live alone.   Work retired   Teacher, early years/pre Strain: Low Risk  (06/07/2023)   Overall Financial Resource Strain (CARDIA)    Difficulty of Paying Living Expenses: Not hard at all  Food Insecurity: No Food Insecurity (06/07/2023)   Hunger Vital Sign    Worried About Running Out of Food in the Last Year: Never true    Ran Out of Food in the Last Year: Never true  Transportation Needs: No Transportation Needs (06/07/2023)   PRAPARE - Administrator, Civil Service (Medical): No    Lack of Transportation (Non-Medical): No  Physical Activity: Inactive (06/07/2023)   Exercise Vital Sign    Days of Exercise per Week: 0 days    Minutes of Exercise per Session: 0 min  Stress: Stress Concern Present (06/07/2023)   [employer] of Occupational Health - Occupational Stress Questionnaire    Feeling of Stress : Rather much  Social Connections: Socially Isolated (06/07/2023)   Social Connection and Isolation Panel    Frequency of Communication with Friends and Family: More than three times a week    Frequency of Social Gatherings with Friends and Family: Never    Attends Religious Services: Never    Database administrator or Organizations: No    Attends Banker Meetings: Never    Marital Status: Divorced  Catering manager Violence: Not At Risk (06/07/2023)   Humiliation, Afraid, Rape, and Kick questionnaire    Fear of Current or Ex-Partner: No    Emotionally Abused: No    Physically Abused: No    Sexually Abused: No    Family History  Problem Relation Age of Onset   Heart disease Mother    Diabetes Mother     Esophageal cancer Father        16's   Diabetes Sister    Diabetes Sister    Colon cancer Neg Hx      Review of Systems  Constitutional: Negative.  Negative for chills and fever.  HENT:  Positive for congestion. Negative for sore throat.        Runny nose  Respiratory:  Negative for cough and  shortness of breath.   Cardiovascular: Negative.  Negative for chest pain and palpitations.  Gastrointestinal:  Negative for abdominal pain, diarrhea, nausea and vomiting.  Genitourinary: Negative.  Negative for dysuria and hematuria.  Musculoskeletal:  Positive for back pain and joint pain.  Skin:  Negative for rash.  Neurological:  Negative for dizziness and headaches.  Endo/Heme/Allergies:  Positive for environmental allergies.  All other systems reviewed and are negative.   Vitals:   11/18/23 1036  BP: (!) 140/74  Pulse: 69  Temp: 98.1 F (36.7 C)  SpO2: 94%    Physical Exam Vitals reviewed.  Constitutional:      Appearance: Normal appearance.  HENT:     Head: Normocephalic.  Eyes:     Extraocular Movements: Extraocular movements intact.  Cardiovascular:     Rate and Rhythm: Normal rate.  Pulmonary:     Effort: Pulmonary effort is normal.  Musculoskeletal:     Comments: Left knee: No swelling or tenderness.  Full range of motion.  No crepitation.  Stable in flexion and extension.  Skin:    General: Skin is warm and dry.     Capillary Refill: Capillary refill takes less than 2 seconds.  Neurological:     General: No focal deficit present.     Mental Status: She is alert and oriented to person, place, and time.  Psychiatric:        Mood and Affect: Mood normal.        Behavior: Behavior normal.   DG Knee 1-2 Views Left Result Date: 11/18/2023 EXAM: 1 or 2 VIEW(S) XRAY OF THE LEFT KNEE 11/18/2023 11:25:52 AM COMPARISON: None available. CLINICAL HISTORY: Left knee pain. Recent left knee pain. NKI. FINDINGS: BONES AND JOINTS: No acute fracture. No focal osseous lesion. No  joint dislocation. No significant joint effusion. Mild superior patellar spurring. Mild medial compartmental osteophyte formation consistent with osteoarthritis. SOFT TISSUES: The soft tissues are unremarkable. IMPRESSION: 1. Mild medial compartmental osteophyte formation consistent with osteoarthritis. 2. Mild superior patellar spurring. Electronically signed by: Donnice Mania MD 11/18/2023 11:31 AM EDT RP Workstation: [id number]      ASSESSMENT & PLAN: Problem List Items Addressed This Visit       Cardiovascular and Mediastinum   Essential hypertension   BP Readings from Last 3 Encounters:  11/18/23 (!) 140/74  10/11/23 124/68  07/26/23 130/60  Normal blood pressure readings at home Continue lisinopril  10 mg daily Cardiovascular risks associated with hypertension discussed Dietary approaches to stop hypertension discussed          Respiratory   Seasonal allergic rhinitis   Active and affecting quality of life Zyrtec  not working very well Recommend Xyzal  5 mg daily as needed      Relevant Medications   levocetirizine (XYZAL ) 5 MG tablet     Other   Dyslipidemia   Chronic stable condition Continue rosuvastatin  10 mg daily Diet and nutrition discussed      Anxiety and depression   Currently stable and well-controlled Continues Prozac  10 mg daily and alprazolam  0.5 mg as needed Stress management discussed      Chronic insomnia   Sleep hygiene measures discussed Suspected sleep apnea Recommend Vistaril  25 mg at bedtime      Chronic pain of left knee - Primary   Unremarkable knee examination today Pain management discussed X-ray done today shows findings of osteoarthritis Recommend follow-up with orthopedist      Relevant Orders   DG Knee 1-2 Views Left (Completed)   Patient Instructions  Acute Knee Pain, Adult Many things can cause knee pain. Sometimes, knee pain is sudden (acute). It may be caused by damage, swelling, or irritation of the muscles and  tissues that support your knee. Pain may come from: A fall. An injury to the knee from twisting motions. A hit to the knee. Infection. The pain often goes away on its own with time and rest. If the pain does not go away, tests may be done to find out what is causing the pain. These may include: Imaging tests, such as an X-ray, MRI, CT scan, or ultrasound. Joint aspiration. In this test, fluid is removed from the knee and checked. Arthroscopy. In this test, a lighted tube is put in the knee and an image is shown on a screen. A biopsy. In this test, a health care provider will remove a small piece of tissue for testing. Follow these instructions at home: If you have a knee sleeve or brace that can be taken off:  Wear the knee sleeve or brace as told by your provider. Take it off only if your provider says that you can. Check the skin around it every day. Tell your provider if you see problems. Loosen the knee sleeve or brace if your toes tingle, are numb, or turn cold and blue. Keep the knee sleeve or brace clean and dry. Bathing If the knee sleeve or brace is not waterproof: Do not let it get wet. Cover it when you take a bath or shower. Use a cover that does not let any water in. Managing pain, stiffness, and swelling  If told, put ice on the area. If you have a knee sleeve or brace that you can take off, remove it as told. Put ice in a plastic bag. Place a towel between your skin and the bag. Leave the ice on for 20 minutes, 2-3 times a day. If your skin turns bright red, take off the ice right away to prevent skin damage. The risk of damage is higher if you cannot feel pain, heat, or cold. Move your toes often to reduce stiffness and swelling. Raise the injured area above the level of your heart while you are sitting or lying down. Use a pillow to support your foot as needed. If told, use an elastic bandage to put pressure (compression) on your injured knee. This may control  swelling, give support, and help with discomfort. Sleep with a pillow under your knee. Activity Rest your knee. Do not do things that cause pain or make pain worse. Do not stand or walk on your injured knee until you're told it's okay. Use crutches as told. Avoid activities where both feet leave the ground at the same time and put stress on the joints. Avoid running, jumping rope, and doing jumping jacks. Work with a physical therapist to make a safe exercise program if told. Physical therapy helps your knee move better and get stronger. Exercise as told. General instructions Take your medicines only as told by your provider. If you are overweight, work with your provider and an expert in healthy eating, called a dietician, to set goals to lose weight. Being overweight can make your knee hurt more. Do not smoke, vape, or use products with nicotine or tobacco in them. If you need help quitting, talk with your provider. Return to normal activities when you are told. Ask what things are safe for you to do. Watch for any changes in your symptoms. Keep all follow-up visits. Your provider will  check your healing and adjust treatments if needed. Contact a health care provider if: The knee pain does not stop. The knee pain changes or gets worse. You have a fever along with knee pain. Your knee is red or feels warm when you touch it. Your knee gives out or locks up. Get help right away if: Your knee swells and the swelling gets worse. You cannot move your knee. You have very bad knee pain that does not get better with medicine. This information is not intended to replace advice given to you by your health care provider. Make sure you discuss any questions you have with your health care provider. Document Revised: 11/26/2022 Document Reviewed: 04/20/2022 Elsevier Patient Education  2024 [employer]     Emil Schaumann, MD [facility]

## 2023-11-18 NOTE — Telephone Encounter (Signed)
 Copied from CRM [id number]. Topic: Clinical - Lab/Test Results >> Nov 18, 2023  4:10 PM Drema MATSU wrote: Reason for CRM: Patient is requesting a callback with imaging results.

## 2023-11-18 NOTE — Assessment & Plan Note (Signed)
Currently stable and well-controlled Continues Prozac 10 mg daily and alprazolam 0.5 mg as needed Stress management discussed

## 2023-11-19 ENCOUNTER — Telehealth: Payer: Self-pay | Admitting: Radiology

## 2023-11-19 NOTE — Telephone Encounter (Signed)
 Pt called back in again stating that she wants a nurse to call her to review imaging results. Pt is expecting a callback before EOD.

## 2023-11-19 NOTE — Telephone Encounter (Signed)
 Spoke with patient note in different encounter

## 2023-11-19 NOTE — Telephone Encounter (Signed)
 Spoke with patient and informed her of Xray results and providers comments. She understood and will f/u sports medicine

## 2023-11-19 NOTE — Telephone Encounter (Signed)
 Copied from CRM [id number]. Topic: Clinical - Lab/Test Results >> Nov 19, 2023 10:27 AM Katie Gardner wrote: Reason for CRM: Patient would like callback to discuss her Xray results from yesterday please.   [phone] (home)

## 2023-12-23 ENCOUNTER — Encounter: Payer: Self-pay | Admitting: Emergency Medicine

## 2023-12-23 ENCOUNTER — Ambulatory Visit (INDEPENDENT_AMBULATORY_CARE_PROVIDER_SITE_OTHER): Admitting: Emergency Medicine

## 2023-12-23 VITALS — BP 138/82 | HR 91 | Temp 98.5°F | Ht 60.0 in | Wt 140.0 lb

## 2023-12-23 DIAGNOSIS — J069 Acute upper respiratory infection, unspecified: Secondary | ICD-10-CM

## 2023-12-23 DIAGNOSIS — J302 Other seasonal allergic rhinitis: Secondary | ICD-10-CM

## 2023-12-23 MED ORDER — METHYLPREDNISOLONE 4 MG PO TBPK: ORAL_TABLET | ORAL | 1 refills | Status: AC

## 2023-12-23 NOTE — Assessment & Plan Note (Signed)
 Clinically stable.  Running its course without complications. Symptom management discussed Advised to rest and stay well-hydrated May benefit from Medrol Dosepak Advised to contact the office if no better or worse during the next several days.

## 2023-12-23 NOTE — Assessment & Plan Note (Addendum)
 Active and affecting quality of life Zyrtec  not working very well Recommend Xyzal  5 mg daily as needed Recommend to start Medrol Dosepak

## 2023-12-23 NOTE — Patient Instructions (Signed)

## 2023-12-23 NOTE — Progress Notes (Signed)
 Katie Gardner 68 y.o.   Chief Complaint  Patient presents with   Cough    Patient has been having a dry cough since last week. She is having watery eyes, itchy throat, ears itching. Says she's been taking Claritin  and has not help    HISTORY OF PRESENT ILLNESS: This is a 68 y.o. female complaining of dry cough and runny nose and eyes with itchy throat and ears itching for 1 week Denies fever or chills.  No other associated symptoms Has tried over-the-counter antihistamines with little relief No other complaints or medical concerns today.  Cough Associated symptoms include a sore throat. Pertinent negatives include no chest pain, chills, fever, headaches, rash or shortness of breath.     [name]   Medication Sig Start Date End Date Taking? Authorizing Provider  ALPRAZolam  (XANAX ) 0.5 MG tablet Take 1 tablet (0.5 mg total) by mouth 2 (two) times daily as needed for anxiety. 11/18/23  Yes Dionta Larke, Emil Schanz, MD  clotrimazole -betamethasone  (LOTRISONE ) cream Apply 1 Application topically daily. 02/15/23  Yes Evann Erazo, Emil Schanz, MD  cyclobenzaprine  (FLEXERIL ) 5 MG tablet Take 1 tablet (5 mg total) by mouth 3 (three) times daily as needed for muscle spasms. 11/18/23  Yes Courtne Lighty, Emil Schanz, MD  FLUoxetine  (PROZAC ) 10 MG tablet Take 1 tablet (10 mg total) by mouth daily. 11/18/23  Yes Johnnie Moten, Emil Schanz, MD  fluticasone  (FLONASE ) 50 MCG/ACT nasal spray SHAKE LIQUID AND USE 2 SPRAYS IN EACH NOSTRIL DAILY 06/25/22  Yes Henson, Vickie L, NP-C  hydrOXYzine  (ATARAX ) 25 MG tablet Take 1 tablet (25 mg total) by mouth at bedtime as needed. 11/18/23  Yes Celest Reitz, Emil Schanz, MD  levocetirizine (XYZAL ) 5 MG tablet Take 1 tablet (5 mg total) by mouth daily as needed for allergies. 11/18/23  Yes Jaylnn Ullery, Emil Schanz, MD  lisinopril  (ZESTRIL ) 10 MG tablet TAKE 1 TABLET(10 MG) BY MOUTH DAILY 11/15/21  Yes Raivyn Kabler, Emil Schanz, MD  loratadine  (CLARITIN ) 10 MG tablet Take 1 tablet  (10 mg total) by mouth daily. 11/18/23  Yes Dontrey Snellgrove, Emil Schanz, MD  meloxicam  (MOBIC ) 15 MG tablet TAKE 1 TABLET(15 MG) BY MOUTH DAILY FOR 10 DAYS 07/09/23  Yes Lizzet Hendley Jose, MD  methylPREDNISolone (MEDROL DOSEPAK) 4 MG TBPK tablet Sig as indicated 12/23/23  Yes Maxwell Lemen, Emil Schanz, MD  Olopatadine  HCl 0.2 % SOLN Apply 2 drops to eye every 6 (six) hours as needed. 06/23/23  Yes Aditi Rovira, Emil Schanz, MD  rosuvastatin  (CRESTOR ) 10 MG tablet Take 1 tablet (10 mg total) by mouth daily. 07/22/23  Yes SagardiaEmil Schanz, MD    Allergies  Allergen Reactions   Macrobid  [Nitrofurantoin ] Nausea And Vomiting   Hydrocodone Nausea And Vomiting    dizzy   Mucinex Dm [Dm-Guaifenesin Er] Swelling    Sob, throat closing    Patient Active Problem List   Diagnosis Date Noted   Suspected sleep apnea 06/09/2022   Chronic pain of left knee 02/17/2022   Chronic insomnia 05/31/2019   Essential hypertension 12/17/2017   Seasonal allergic rhinitis 12/17/2017   Anxiety and depression 11/13/2016   Chronic anxiety 11/13/2016   Situational anxiety    Dyslipidemia     Past Medical History:  Diagnosis Date   Allergy    seasonal   Anxiety    Depression    Family history of adverse reaction to anesthesia    sister hard to wake up   Hematuria    Hot flashes    Hx of cardiovascular stress test  Lex MV 12/13:  EF 68%, mild apical thinning, no ischemia   Hyperlipidemia    Hypertension    PONV (postoperative nausea and vomiting)     Past Surgical History:  Procedure Laterality Date   BREAST LUMPECTOMY     right breast, not cancer   Carpel tunnel     ECTOPIC PREGNANCY SURGERY      Social History   Socioeconomic History   Marital status: Divorced    Spouse name: Not on file   Number of children: 1   Years of education: 8th grade   Highest education level: Not on file  Occupational History    Employer: UNEMPLOYED  Tobacco Use   Smoking status: Former    Current packs/day: 0.00     Average packs/day: 1 pack/day for 6.0 years (6.0 ttl pk-yrs)    Types: Cigarettes    Start date: 03/09/1974    Quit date: 03/09/1980    Years since quitting: 43.8    Passive exposure: Past   Smokeless tobacco: Never  Vaping Use   Vaping status: Never Used  Substance and Sexual Activity   Alcohol use: Not Currently   Drug use: No   Sexual activity: Yes  Other Topics Concern   Not on file  Social History Narrative   Caffiene coffee 1 cup   Live alone.   Work retired   Teacher, early years/pre Strain: Low Risk  (06/07/2023)   Overall Financial Resource Strain (CARDIA)    Difficulty of Paying Living Expenses: Not hard at all  Food Insecurity: No Food Insecurity (06/07/2023)   Hunger Vital Sign    Worried About Running Out of Food in the Last Year: Never true    Ran Out of Food in the Last Year: Never true  Transportation Needs: No Transportation Needs (06/07/2023)   PRAPARE - Administrator, Civil Service (Medical): No    Lack of Transportation (Non-Medical): No  Physical Activity: Inactive (06/07/2023)   Exercise Vital Sign    Days of Exercise per Week: 0 days    Minutes of Exercise per Session: 0 min  Stress: Stress Concern Present (06/07/2023)   [employer] of Occupational Health - Occupational Stress Questionnaire    Feeling of Stress : Rather much  Social Connections: Socially Isolated (06/07/2023)   Social Connection and Isolation Panel    Frequency of Communication with Friends and Family: More than three times a week    Frequency of Social Gatherings with Friends and Family: Never    Attends Religious Services: Never    Database administrator or Organizations: No    Attends Banker Meetings: Never    Marital Status: Divorced  Catering manager Violence: Not At Risk (06/07/2023)   Humiliation, Afraid, Rape, and Kick questionnaire    Fear of Current or Ex-Partner: No    Emotionally Abused: No    Physically Abused: No     Sexually Abused: No    Family History  Problem Relation Age of Onset   Heart disease Mother    Diabetes Mother    Esophageal cancer Father        36's   Diabetes Sister    Diabetes Sister    Colon cancer Neg Hx      Review of Systems  Constitutional: Negative.  Negative for chills and fever.  HENT:  Positive for congestion and sore throat.   Respiratory:  Positive for cough. Negative for sputum production and shortness of breath.  Cardiovascular: Negative.  Negative for chest pain and palpitations.  Gastrointestinal:  Negative for abdominal pain, diarrhea, nausea and vomiting.  Genitourinary: Negative.  Negative for dysuria and hematuria.  Skin: Negative.  Negative for rash.  Neurological: Negative.  Negative for dizziness and headaches.  All other systems reviewed and are negative.   Vitals:   12/23/23 1257  BP: 138/82  Pulse: 91  Temp: 98.5 F (36.9 C)  SpO2: 96%    Physical Exam Vitals reviewed.  Constitutional:      Appearance: Normal appearance.  HENT:     Head: Normocephalic.     Mouth/Throat:     Mouth: Mucous membranes are moist.     Pharynx: Oropharynx is clear.  Eyes:     Extraocular Movements: Extraocular movements intact.     Conjunctiva/sclera: Conjunctivae normal.     Pupils: Pupils are equal, round, and reactive to light.  Cardiovascular:     Rate and Rhythm: Normal rate and regular rhythm.     Pulses: Normal pulses.     Heart sounds: Normal heart sounds.  Pulmonary:     Effort: Pulmonary effort is normal.     Breath sounds: Normal breath sounds.  Musculoskeletal:     Cervical back: No tenderness.  Lymphadenopathy:     Cervical: No cervical adenopathy.  Skin:    General: Skin is warm and dry.     Capillary Refill: Capillary refill takes less than 2 seconds.  Neurological:     General: No focal deficit present.     Mental Status: She is alert and oriented to person, place, and time.  Psychiatric:        Mood and Affect: Mood normal.         Behavior: Behavior normal.      ASSESSMENT & PLAN: I personally spent a total of 30 minutes minutes in the care of the patient today including preparing to see the patient, getting/reviewing separately obtained history, performing a medically appropriate exam/evaluation, counseling and educating, documenting clinical information in the EHR, and coordinating care.  Problem List Items Addressed This Visit       Respiratory   Seasonal allergic rhinitis   Active and affecting quality of life Zyrtec  not working very well Recommend Xyzal  5 mg daily as needed Recommend to start Medrol Dosepak        Upper respiratory infection, viral - Primary   Clinically stable.  Running its course without complications. Symptom management discussed Advised to rest and stay well-hydrated May benefit from Medrol Dosepak Advised to contact the office if no better or worse during the next several days.      Relevant Medications   methylPREDNISolone (MEDROL DOSEPAK) 4 MG TBPK tablet   Patient Instructions  Upper Respiratory Infection, Adult An upper respiratory infection (URI) affects the nose, throat, and upper airways that lead to the lungs. The most common type of URI is often called the common cold. URIs usually get better on their own, without medical treatment. What are the causes? A URI is caused by a germ (virus). You may catch these germs by: Breathing in droplets from an infected person's cough or sneeze. Touching something that has the germ on it (is contaminated) and then touching your mouth, nose, or eyes. What increases the risk? You are more likely to get a URI if: You are very young or very old. You have close contact with others, such as at work, school, or a health care facility. You smoke. You have long-term (chronic) heart or  lung disease. You have a weakened disease-fighting system (immune system). You have nasal allergies or asthma. You have a lot of stress. You have  poor nutrition. What are the signs or symptoms? Runny or stuffy (congested) nose. Cough. Sneezing. Sore throat. Headache. Feeling tired (fatigue). Fever. Not wanting to eat as much as usual. Pain in your forehead, behind your eyes, and over your cheekbones (sinus pain). Muscle aches. Redness or irritation of the eyes. Pressure in the ears or face. How is this treated? URIs usually get better on their own within 7-10 days. Medicines cannot cure URIs, but your doctor may recommend certain medicines to help relieve symptoms, such as: Over-the-counter cold medicines. Medicines to reduce coughing (cough suppressants). Coughing is a type of defense against infection that helps to clear the nose, throat, windpipe, and lungs (respiratory system). Take these medicines only as told by your doctor. Medicines to lower your fever. Follow these instructions at home: Activity Rest as needed. If you have a fever, stay home from work or school until your fever is gone, or until your doctor says you may return to work or school. You should stay home until you cannot spread the infection anymore (you are not contagious). Your doctor may have you wear a face mask so you have less risk of spreading the infection. Relieving symptoms Rinse your mouth often with salt water. To make salt water, dissolve -1 tsp (3-6 g) of salt in 1 cup (237 mL) of warm water. Use a cool-mist humidifier to add moisture to the air. This can help you breathe more easily. Eating and drinking  Drink enough fluid to keep your pee (urine) pale yellow. Eat soups and other clear broths. General instructions  Take over-the-counter and prescription medicines only as told by your doctor. Do not smoke or use any products that contain nicotine or tobacco. If you need help quitting, ask your doctor. Avoid being where people are smoking (avoid secondhand smoke). Stay up to date on all your shots (immunizations), and get the flu shot  every year. Keep all follow-up visits. How to prevent the spread of infection to others  Wash your hands with soap and water for at least 20 seconds. If you cannot use soap and water, use hand sanitizer. Avoid touching your mouth, face, eyes, or nose. Cough or sneeze into a tissue or your sleeve or elbow. Do not cough or sneeze into your hand or into the air. Contact a doctor if: You are getting worse, not better. You have any of these: A fever or chills. Brown or red mucus in your nose. Yellow or brown fluid (discharge)coming from your nose. Pain in your face, especially when you bend forward. Swollen neck glands. Pain when you swallow. White areas in the back of your throat. Get help right away if: You have shortness of breath that gets worse. You have very bad or constant: Headache. Ear pain. Pain in your forehead, behind your eyes, and over your cheekbones (sinus pain). Chest pain. You have long-lasting (chronic) lung disease along with any of these: Making high-pitched whistling sounds when you breathe, most often when you breathe out (wheezing). Long-lasting cough (more than 14 days). Coughing up blood. A change in your usual mucus. You have a stiff neck. You have changes in your: Vision. Hearing. Thinking. Mood. These symptoms may be an emergency. Get help right away. Call 911. Do not wait to see if the symptoms will go away. Do not drive yourself to the hospital. Summary An upper  respiratory infection (URI) is caused by a germ (virus). The most common type of URI is often called the common cold. URIs usually get better within 7-10 days. Take over-the-counter and prescription medicines only as told by your doctor. This information is not intended to replace advice given to you by your health care provider. Make sure you discuss any questions you have with your health care provider. Document Revised: 09/25/2020 Document Reviewed: 09/25/2020 Elsevier Patient Education   2024 Elsevier Inc.    Emil Schaumann, MD [facility]

## 2024-01-06 ENCOUNTER — Ambulatory Visit: Payer: Self-pay | Admitting: *Deleted

## 2024-01-06 ENCOUNTER — Ambulatory Visit (INDEPENDENT_AMBULATORY_CARE_PROVIDER_SITE_OTHER): Admitting: Emergency Medicine

## 2024-01-06 ENCOUNTER — Encounter: Payer: Self-pay | Admitting: Emergency Medicine

## 2024-01-06 VITALS — BP 120/82 | HR 90 | Temp 98.0°F | Ht 60.0 in | Wt 140.0 lb

## 2024-01-06 DIAGNOSIS — B9689 Other specified bacterial agents as the cause of diseases classified elsewhere: Secondary | ICD-10-CM | POA: Insufficient documentation

## 2024-01-06 DIAGNOSIS — J329 Chronic sinusitis, unspecified: Secondary | ICD-10-CM | POA: Diagnosis not present

## 2024-01-06 MED ORDER — AZITHROMYCIN 250 MG PO TABS: ORAL_TABLET | ORAL | 0 refills | Status: AC

## 2024-01-06 NOTE — Assessment & Plan Note (Signed)
 Upper viral respiratory infection now with secondary bacterial infection in the sinuses. Recommend azithromycin daily for 5 days Symptom management discussed Recommend Sudafed for congestion and frequent rinsing with nasal saline sprays.  Recommend Nettie pot Advised to rest and stay well-hydrated Symptom management discussed

## 2024-01-06 NOTE — Telephone Encounter (Signed)
 FYI Only or Action Required?: FYI only for provider: appointment scheduled on 10/30.  Patient was last seen in primary care on 12/23/2023 by Purcell Emil Schanz, MD.  Called Nurse Triage reporting No chief complaint on file..  Symptoms began several weeks ago.  Interventions attempted: OTC medication- benadryl .  Symptoms are: gradually worsening.  Triage Disposition: See PCP When Office is Open (Within 3 Days)  Patient/caregiver understands and will follow disposition?: Yes  Copied from CRM #[id number]. Topic: Clinical - Red Word Triage >> Jan 06, 2024  9:02 AM Roselie BROCKS wrote: [facility] that prompted transfer to Nurse Triage: Patient states she has productive coughing, and her throat feels horrible, can't talk good,like something stuck feeling of having to constantly clear the throat Reason for Disposition  Cough has been present for > 3 weeks  Answer Assessment - Initial Assessment Questions 1. ONSET: When did the cough begin?      OV -2 weeks ago 2. SEVERITY: How bad is the cough today?      Cough is better- sinus drainage in throat- causing constant throat clearing 3. SPUTUM: Describe the color of your sputum (e.g., none, dry cough; clear, white, yellow, green)     Very little 4. HEMOPTYSIS: Are you coughing up any blood? If Yes, ask: How much? (e.g., flecks, streaks, tablespoons, etc.)     no 5. DIFFICULTY BREATHING: Are you having difficulty breathing? If Yes, ask: How bad is it? (e.g., mild, moderate, severe)      no 6. FEVER: Do you have a fever? If Yes, ask: What is your temperature, how was it measured, and when did it start?     no  10. OTHER SYMPTOMS: Do you have any other symptoms? (e.g., runny nose, wheezing, chest pain)       Ear popping  Patient was given prednisone  at last visit- it did help some- patient is still have a lot of sinus drainage in the troat causing constant clearing.  Protocols used: Cough - Acute Productive-A-AH

## 2024-01-06 NOTE — Progress Notes (Signed)
 Katie Gardner 68 y.o.   Chief Complaint  Patient presents with   Sinus Problem    Possible allergies constant clearing the throat and pt has tickle as well this has just started about a week ago     HISTORY OF PRESENT ILLNESS: This is a 68 y.o. female here for follow-up of office visit on 12/23/2023 when she presented with flulike symptoms Symptoms progressing and getting worse.  Mostly complaining of sinus congestion and occasional drainage No other associated symptoms.  No other complaints or medical concerns today.  Sinus Problem Associated symptoms include congestion. Pertinent negatives include no chills, coughing, headaches, shortness of breath or sore throat.     [name]   Medication Sig Start Date End Date Taking? Authorizing Provider  ALPRAZolam  (XANAX ) 0.5 MG tablet Take 1 tablet (0.5 mg total) by mouth 2 (two) times daily as needed for anxiety. 11/18/23  Yes Purcell Emil Schanz, MD  azithromycin (ZITHROMAX) 250 MG tablet Take 2 tablets on day 1, then 1 tablet daily on days 2 through 5 01/06/24 01/11/24 Yes Andrian Sabala, Emil Schanz, MD  clotrimazole -betamethasone  (LOTRISONE ) cream Apply 1 Application topically daily. 02/15/23  Yes Louellen Haldeman, Emil Schanz, MD  cyclobenzaprine  (FLEXERIL ) 5 MG tablet Take 1 tablet (5 mg total) by mouth 3 (three) times daily as needed for muscle spasms. 11/18/23  Yes Djon Tith, Emil Schanz, MD  FLUoxetine  (PROZAC ) 10 MG tablet Take 1 tablet (10 mg total) by mouth daily. 11/18/23  Yes Delesa Kawa, Emil Schanz, MD  fluticasone  (FLONASE ) 50 MCG/ACT nasal spray SHAKE LIQUID AND USE 2 SPRAYS IN EACH NOSTRIL DAILY 06/25/22  Yes Henson, Vickie L, NP-C  hydrOXYzine  (ATARAX ) 25 MG tablet Take 1 tablet (25 mg total) by mouth at bedtime as needed. 11/18/23  Yes Madelyn Tlatelpa, Emil Schanz, MD  levocetirizine (XYZAL ) 5 MG tablet Take 1 tablet (5 mg total) by mouth daily as needed for allergies. 11/18/23  Yes Gyanna Jarema, Emil Schanz, MD  lisinopril  (ZESTRIL ) 10  MG tablet TAKE 1 TABLET(10 MG) BY MOUTH DAILY 11/15/21  Yes Leyland Kenna, Emil Schanz, MD  loratadine  (CLARITIN ) 10 MG tablet Take 1 tablet (10 mg total) by mouth daily. 11/18/23  Yes Holston Oyama, Emil Schanz, MD  meloxicam  (MOBIC ) 15 MG tablet TAKE 1 TABLET(15 MG) BY MOUTH DAILY FOR 10 DAYS 07/09/23  Yes Zanya Lindo Jose, MD  methylPREDNISolone (MEDROL DOSEPAK) 4 MG TBPK tablet Sig as indicated 12/23/23  Yes Percival Glasheen, Emil Schanz, MD  Olopatadine  HCl 0.2 % SOLN Apply 2 drops to eye every 6 (six) hours as needed. 06/23/23  Yes Lenor Provencher, Emil Schanz, MD  rosuvastatin  (CRESTOR ) 10 MG tablet Take 1 tablet (10 mg total) by mouth daily. 07/22/23  Yes Purcell Emil Schanz, MD    Allergies  Allergen Reactions   Macrobid  [Nitrofurantoin ] Nausea And Vomiting   Hydrocodone Nausea And Vomiting    dizzy   Mucinex Dm [Dm-Guaifenesin Er] Swelling    Sob, throat closing    Patient Active Problem List   Diagnosis Date Noted   Upper respiratory infection, viral 02/15/2023   Suspected sleep apnea 06/09/2022   Chronic pain of left knee 02/17/2022   Chronic insomnia 05/31/2019   Essential hypertension 12/17/2017   Seasonal allergic rhinitis 12/17/2017   Anxiety and depression 11/13/2016   Chronic anxiety 11/13/2016   Situational anxiety    Dyslipidemia     Past Medical History:  Diagnosis Date   Allergy    seasonal   Anxiety    Depression    Family history of adverse reaction to  anesthesia    sister hard to wake up   Hematuria    Hot flashes    Hx of cardiovascular stress test    Lex MV 12/13:  EF 68%, mild apical thinning, no ischemia   Hyperlipidemia    Hypertension    PONV (postoperative nausea and vomiting)     Past Surgical History:  Procedure Laterality Date   BREAST LUMPECTOMY     right breast, not cancer   Carpel tunnel     ECTOPIC PREGNANCY SURGERY      Social History   Socioeconomic History   Marital status: Divorced    Spouse name: Not on file   Number of children: 1    Years of education: 8th grade   Highest education level: Not on file  Occupational History    Employer: UNEMPLOYED  Tobacco Use   Smoking status: Former    Current packs/day: 0.00    Average packs/day: 1 pack/day for 6.0 years (6.0 ttl pk-yrs)    Types: Cigarettes    Start date: 03/09/1974    Quit date: 03/09/1980    Years since quitting: 43.8    Passive exposure: Past   Smokeless tobacco: Never  Vaping Use   Vaping status: Never Used  Substance and Sexual Activity   Alcohol use: Not Currently   Drug use: No   Sexual activity: Yes  Other Topics Concern   Not on file  Social History Narrative   Caffiene coffee 1 cup   Live alone.   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)   Overall Financial Resource Strain (CARDIA)    Difficulty of Paying Living Expenses: Not hard at all  Food Insecurity: No Food Insecurity (06/07/2023)   Hunger Vital Sign    Worried About Running Out of Food in the Last Year: Never true    Ran Out of Food in the Last Year: Never true  Transportation Needs: No Transportation Needs (06/07/2023)   PRAPARE - Administrator, Civil Service (Medical): No    Lack of Transportation (Non-Medical): No  Physical Activity: Inactive (06/07/2023)   Exercise Vital Sign    Days of Exercise per Week: 0 days    Minutes of Exercise per Session: 0 min  Stress: Stress Concern Present (06/07/2023)   [employer] of Occupational Health - Occupational Stress Questionnaire    Feeling of Stress : Rather much  Social Connections: Socially Isolated (06/07/2023)   Social Connection and Isolation Panel    Frequency of Communication with Friends and Family: More than three times a week    Frequency of Social Gatherings with Friends and Family: Never    Attends Religious Services: Never    Database Administrator or Organizations: No    Attends Banker Meetings: Never    Marital Status: Divorced  Careers Information Officer Violence: Not At Risk (06/07/2023)   Humiliation, Afraid, Rape, and Kick questionnaire    Fear of Current or Ex-Partner: No    Emotionally Abused: No    Physically Abused: No    Sexually Abused: No    Family History  Problem Relation Age of Onset   Heart disease Mother    Diabetes Mother    Esophageal cancer Father        71's   Diabetes Sister    Diabetes Sister    Colon cancer Neg Hx      Review of Systems  Constitutional: Negative.  Negative  for chills and fever.  HENT:  Positive for congestion. Negative for sore throat.   Respiratory: Negative.  Negative for cough and shortness of breath.   Cardiovascular: Negative.  Negative for chest pain and palpitations.  Gastrointestinal:  Negative for abdominal pain, diarrhea, nausea and vomiting.  Genitourinary: Negative.  Negative for dysuria and hematuria.  Skin: Negative.  Negative for rash.  Neurological: Negative.  Negative for dizziness and headaches.  All other systems reviewed and are negative.   Vitals:   01/06/24 1524  BP: 120/82  Pulse: 90  Temp: 98 F (36.7 C)  SpO2: 97%    Physical Exam Vitals reviewed.  Constitutional:      Appearance: Normal appearance.  HENT:     Head: Normocephalic.     Right Ear: Tympanic membrane, ear canal and external ear normal.     Left Ear: Tympanic membrane, ear canal and external ear normal.     Nose: Congestion present.     Mouth/Throat:     Mouth: Mucous membranes are moist.     Pharynx: Oropharynx is clear.  Eyes:     Extraocular Movements: Extraocular movements intact.     Conjunctiva/sclera: Conjunctivae normal.     Pupils: Pupils are equal, round, and reactive to light.  Cardiovascular:     Rate and Rhythm: Normal rate and regular rhythm.     Pulses: Normal pulses.     Heart sounds: Normal heart sounds.  Pulmonary:     Effort: Pulmonary effort is normal.     Breath sounds: Normal breath sounds.  Musculoskeletal:     Cervical back: No tenderness.   Lymphadenopathy:     Cervical: No cervical adenopathy.  Skin:    General: Skin is warm and dry.     Capillary Refill: Capillary refill takes less than 2 seconds.  Neurological:     General: No focal deficit present.     Mental Status: She is alert and oriented to person, place, and time.  Psychiatric:        Mood and Affect: Mood normal.        Behavior: Behavior normal.      ASSESSMENT & PLAN: Problem List Items Addressed This Visit       Respiratory   Bacterial sinusitis - Primary   Upper viral respiratory infection now with secondary bacterial infection in the sinuses. Recommend azithromycin daily for 5 days Symptom management discussed Recommend Sudafed for congestion and frequent rinsing with nasal saline sprays.  Recommend Nettie pot Advised to rest and stay well-hydrated Symptom management discussed      Relevant Medications   azithromycin (ZITHROMAX) 250 MG tablet   Patient Instructions  Sinus Infection, Adult A sinus infection is soreness and swelling (inflammation) of your sinuses. Sinuses are hollow spaces in the bones around your face. They are located: Around your eyes. In the middle of your forehead. Behind your nose. In your cheekbones. Your sinuses and nasal passages are lined with a fluid called mucus. Mucus drains out of your sinuses. Swelling can trap mucus in your sinuses. This lets germs (bacteria, virus, or fungus) grow, which leads to infection. Most of the time, this condition is caused by a virus. What are the causes? Allergies. Asthma. Germs. Things that block your nose or sinuses. Growths in the nose (nasal polyps). Chemicals or irritants in the air. A fungus. This is rare. What increases the risk? Having a weak body defense system (immune system). Doing a lot of swimming or diving. Using nasal sprays too much.  Smoking. What are the signs or symptoms? The main symptoms of this condition are pain and a feeling of pressure around the  sinuses. Other symptoms include: Stuffy nose (congestion). This may make it hard to breathe through your nose. Runny nose (drainage). Soreness, swelling, and warmth in the sinuses. A cough that may get worse at night. Being unable to smell and taste. Mucus that collects in the throat or the back of the nose (postnasal drip). This may cause a sore throat or bad breath. Being very tired (fatigued). A fever. How is this diagnosed? Your symptoms. Your medical history. A physical exam. Tests to find out if your condition is short-term (acute) or long-term (chronic). Your doctor may: Check your nose for growths (polyps). Check your sinuses using a tool that has a light on one end (endoscope). Check for allergies or germs. Do imaging tests, such as an MRI or CT scan. How is this treated? Treatment for this condition depends on the cause and whether it is short-term or long-term. If caused by a virus, your symptoms should go away on their own within 10 days. You may be given medicines to relieve symptoms. They include: Medicines that shrink swollen tissue in the nose. A spray that treats swelling of the nostrils. Rinses that help get rid of thick mucus in your nose (nasal saline washes). Medicines that treat allergies (antihistamines). Over-the-counter pain relievers. If caused by bacteria, your doctor may wait to see if you will get better without treatment. You may be given antibiotic medicine if you have: A very bad infection. A weak body defense system. If caused by growths in the nose, surgery may be needed. Follow these instructions at home: Medicines Take, use, or apply over-the-counter and prescription medicines only as told by your doctor. These may include nasal sprays. If you were prescribed an antibiotic medicine, take it as told by your doctor. Do not stop taking it even if you start to feel better. Hydrate and humidify  Drink enough water to keep your pee (urine) pale  yellow. Use a cool mist humidifier to keep the humidity level in your home above 50%. Breathe in steam for 10-15 minutes, 3-4 times a day, or as told by your doctor. You can do this in the bathroom while a hot shower is running. Try not to spend time in cool or dry air. Rest Rest as much as you can. Sleep with your head raised (elevated). Make sure you get enough sleep each night. General instructions  Put a warm, moist washcloth on your face 3-4 times a day, or as often as told by your doctor. Use nasal saline washes as often as told by your doctor. Wash your hands often with soap and water. If you cannot use soap and water, use hand sanitizer. Do not smoke. Avoid being around people who are smoking (secondhand smoke). Keep all follow-up visits. Contact a doctor if: You have a fever. Your symptoms get worse. Your symptoms do not get better within 10 days. Get help right away if: You have a very bad headache. You cannot stop vomiting. You have very bad pain or swelling around your face or eyes. You have trouble seeing. You feel confused. Your neck is stiff. You have trouble breathing. These symptoms may be an emergency. Get help right away. Call 911. Do not wait to see if the symptoms will go away. Do not drive yourself to the hospital. Summary A sinus infection is swelling of your sinuses. Sinuses are hollow spaces  in the bones around your face. This condition is caused by tissues in your nose that become inflamed or swollen. This traps germs. These can lead to infection. If you were prescribed an antibiotic medicine, take it as told by your doctor. Do not stop taking it even if you start to feel better. Keep all follow-up visits. This information is not intended to replace advice given to you by your health care provider. Make sure you discuss any questions you have with your health care provider. Document Revised: 01/28/2021 Document Reviewed: 01/28/2021 Elsevier Patient  Education  2024 Elsevier Inc.     Emil Schaumann, MD [facility]

## 2024-01-06 NOTE — Patient Instructions (Signed)

## 2024-01-07 ENCOUNTER — Telehealth: Payer: Self-pay | Admitting: Emergency Medicine

## 2024-01-07 NOTE — Telephone Encounter (Unsigned)
 Copied from CRM #[id number]. Topic: Clinical - Medication Refill >> Jan 07, 2024  3:17 PM Brittany M wrote: Medication: ibuprofen  (ADVIL ) 600 MG tablet  Has the patient contacted their pharmacy? Yes (Agent: If no, request that the patient contact the pharmacy for the refill. If patient does not wish to contact the pharmacy document the reason why and proceed with request.) (Agent: If yes, when and what did the pharmacy advise?)  This is the patient's preferred pharmacy:  [id number] [address], [place] - [address] AT [facility] OF [facility] & [address] [place] [address] Phone: [phone] Fax: [phone]  Is this the correct pharmacy for this prescription? Yes If no, delete pharmacy and type the correct one.   Has the prescription been filled recently? Yes  Is the patient out of the medication? Yes  Has the patient been seen for an appointment in the last year OR does the patient have an upcoming appointment? Yes  Can we respond through MyChart? Yes  Agent: Please be advised that Rx refills may take up to 3 business days. We ask that you follow-up with your pharmacy.

## 2024-01-07 NOTE — Telephone Encounter (Signed)
 Requesting refill for Advil , not on profile

## 2024-01-10 ENCOUNTER — Ambulatory Visit: Payer: Self-pay | Admitting: Emergency Medicine

## 2024-01-10 NOTE — Telephone Encounter (Signed)
 FYI Only or Action Required?: Action required by provider: medication refill request.  Patient was last seen in primary care on 01/06/2024 by Purcell Emil Schanz, MD.  Called Nurse Triage reporting Eye Problem.  Symptoms began several days ago.   Triage Disposition: Home Care  Patient/caregiver understands and will follow disposition?: Yes                 Reason for Disposition  Mild eye allergy  Answer Assessment - Initial Assessment Questions Eyes are itching, red, intermittent swelling Pt states this is from her allergies Onset: 2-3 days ago Intermittent during winter; has happened before  Pt wants eye drops for allergies sent to the below pharmacy:  Pharmacy: [id number] [address], [place] - [phone] AT [facility] OF [facility] & [address] [place] [address] Phone: [phone]  Fax: [phone]   -------------------   Pt also states she needs a prescription sent for ibuprofen  and pt states she has called about this.  Protocols used: Eye - Allergy-A-AH

## 2024-01-10 NOTE — Telephone Encounter (Signed)
 Copied from CRM [id number]. Topic: General - Other >> Jan 10, 2024 10:54 AM Nessti S wrote: Reason for CRM: pt called about ibuprofen  refill. Would like a call back when refilled. Call back number [phone] >> Jan 10, 2024 10:56 AM Nessti S wrote: w

## 2024-01-10 NOTE — Telephone Encounter (Signed)
 Copied from CRM [id number]. Topic: Clinical - Medical Advice >> Jan 10, 2024 10:56 AM Nessti S wrote: Reason for CRM: pt would like eye drops for allergies. >> Jan 10, 2024  3:40 PM Mesmerise C wrote: Patient returning call to nurse transferred to NT >> Jan 10, 2024 12:53 PM Macario HERO wrote: Patient returning nurse called. Upon trying to transfer patient she said she had other calls waiting and advised her to call back and request to speak with a nurse.   Pt returning call from earlier. See triage note from 1:10pm. Pt was requesting RX for Ibuprofen  600mg  and Eye drops . Home care provided by previous NT

## 2024-01-10 NOTE — Telephone Encounter (Signed)
 Prefer she takes over-the-counter Advil 

## 2024-01-10 NOTE — Telephone Encounter (Signed)
 Both medication or not file. Informs patient that she can go to local pharmacy and pick this up OTC?

## 2024-01-10 NOTE — Telephone Encounter (Signed)
 This RN made first attempt to contact patient with no answer. A voicemail was left with call back number provided.   Copied from CRM [id number]. Topic: Clinical - Medical Advice >> Jan 10, 2024 10:56 AM Nessti S wrote: Reason for CRM: pt would like eye drops for allergies.

## 2024-01-10 NOTE — Telephone Encounter (Signed)
 Called patient and informed her that she can pick this up over the counter and does not need a prescription from us 

## 2024-01-14 ENCOUNTER — Other Ambulatory Visit: Payer: Self-pay | Admitting: Internal Medicine

## 2024-01-14 MED ORDER — AZELASTINE HCL 0.05 % OP SOLN: 1.0000 [drp] | Freq: Two times a day (BID) | OPHTHALMIC | 2 refills | Status: AC

## 2024-01-14 MED ORDER — AZELASTINE HCL 0.05 % OP SOLN: 1.0000 [drp] | Freq: Two times a day (BID) | OPHTHALMIC | Status: DC

## 2024-01-14 NOTE — Telephone Encounter (Signed)
 Prescription sent to pharmacy.

## 2024-01-14 NOTE — Telephone Encounter (Signed)
**Note De-identified  W[place]bright Obfuscation** Please advise 

## 2024-01-17 NOTE — Telephone Encounter (Signed)
 Called patient and notified that this has been sent in

## 2024-01-21 ENCOUNTER — Other Ambulatory Visit: Payer: Self-pay | Admitting: Emergency Medicine

## 2024-01-21 DIAGNOSIS — F418 Other specified anxiety disorders: Secondary | ICD-10-CM

## 2024-01-21 DIAGNOSIS — F32A Depression, unspecified: Secondary | ICD-10-CM

## 2024-01-21 NOTE — Telephone Encounter (Unsigned)
 Copied from CRM #[id number]. Topic: Clinical - Medication Refill >> Jan 21, 2024  4:54 PM Tysheama G wrote: Medication: ALPRAZolam  (XANAX ) 0.5 MG tablet  Has the patient contacted their pharmacy? Yes (Agent: If no, request that the patient contact the pharmacy for the refill. If patient does not wish to contact the pharmacy document the reason why and proceed with request.) (Agent: If yes, when and what did the pharmacy advise?)  This is the patient's preferred pharmacy:  [facility] #[id number] [address], [place] - [address] AT [facility] OF [facility] & [address] [place] [address] [place] [address] Phone: [phone] Fax: [phone]  Is this the correct pharmacy for this prescription? Yes If no, delete pharmacy and type the correct one.   Has the prescription been filled recently? No  Is the patient out of the medication? Yes  Has the patient been seen for an appointment in the last year OR does the patient have an upcoming appointment? Yes  Can we respond through MyChart? No  Agent: Please be advised that Rx refills may take up to 3 business days. We ask that you follow-up with your pharmacy.

## 2024-01-24 ENCOUNTER — Telehealth: Payer: Self-pay

## 2024-01-24 NOTE — Telephone Encounter (Signed)
 Copied from CRM #[id number]. Topic: Clinical - Prescription Issue >> Jan 24, 2024 11:50 AM Nessti S wrote: Reason for CRM: pt called for an update on ALPRAZolam  (XANAX ) 0.5 MG tablet refill from 01/21/2024. Call back number [phone]

## 2024-01-25 NOTE — Telephone Encounter (Signed)
 Called patient and was unable to lVM. Need to know if patient is requesting a refill or not because this look like it was sent in on the 14th

## 2024-02-01 ENCOUNTER — Encounter: Payer: Self-pay | Admitting: Emergency Medicine

## 2024-02-01 ENCOUNTER — Telehealth (INDEPENDENT_AMBULATORY_CARE_PROVIDER_SITE_OTHER): Admitting: Emergency Medicine

## 2024-02-01 DIAGNOSIS — F418 Other specified anxiety disorders: Secondary | ICD-10-CM

## 2024-02-01 DIAGNOSIS — F419 Anxiety disorder, unspecified: Secondary | ICD-10-CM

## 2024-02-01 DIAGNOSIS — F32A Depression, unspecified: Secondary | ICD-10-CM | POA: Diagnosis not present

## 2024-02-01 DIAGNOSIS — R0989 Other specified symptoms and signs involving the circulatory and respiratory systems: Secondary | ICD-10-CM

## 2024-02-01 MED ORDER — ALPRAZOLAM 0.5 MG PO TABS: 0.5000 mg | ORAL_TABLET | Freq: Two times a day (BID) | ORAL | 1 refills | Status: DC | PRN

## 2024-02-01 MED ORDER — IBUPROFEN 600 MG PO TABS
600.0000 mg | ORAL_TABLET | Freq: Three times a day (TID) | ORAL | 0 refills | Status: AC | PRN
Start: 2024-02-01 — End: ?

## 2024-02-01 NOTE — Assessment & Plan Note (Signed)
 Chronic problem affecting quality of life Family history of throat cancer Recommend ENT evaluation Referral placed today

## 2024-02-01 NOTE — Progress Notes (Signed)
 Telemedicine Encounter- SOAP NOTE Established Patient MyChart video encounter Patient: Home  Provider: Office   Patient present only  This video encounter was conducted with the patient's (or proxy's) verbal consent via video telecommunications: yes/no: Yes Patient was instructed to have this encounter in a suitably private space; and to only have persons present to whom they give permission to participate. In addition, patient identity was confirmed by use of name plus two identifiers (DOB and address).  Chief Complaint  Patient presents with   Sinus Problem    Pt states that she is having some  drainage in her throat and she is constantly having to clear her throat a lot and have not tried anything over the counter    Subjective  Katie Gardner is a 68 y.o. established patient.  Visit today complaining of chronic constant clearing and plugging on her throat Also requesting refills on ibuprofen  and Xanax  No other complaints or medical concerns today. Family history of esophageal cancer Follow-up of recent office visit for bacterial sinusitis  Sinus Problem Associated symptoms include congestion. Pertinent negatives include no chills, coughing, headaches, shortness of breath or sore throat.   ? Patient Active Problem List   Diagnosis Date Noted   Chronic throat clearing 02/01/2024   Bacterial sinusitis 01/06/2024   Upper respiratory infection, viral 02/15/2023   Suspected sleep apnea 06/09/2022   Chronic pain of left knee 02/17/2022   Chronic insomnia 05/31/2019   Essential hypertension 12/17/2017   Seasonal allergic rhinitis 12/17/2017   Anxiety and depression 11/13/2016   Chronic anxiety 11/13/2016   Situational anxiety    Dyslipidemia    Past Medical History:  Diagnosis Date   Allergy    seasonal   Anxiety    Depression    Family history of adverse reaction to anesthesia    sister hard to wake up   Hematuria    Hot flashes    Hx of cardiovascular stress test    Lex  MV 12/13:  EF 68%, mild apical thinning, no ischemia   Hyperlipidemia    Hypertension    PONV (postoperative nausea and vomiting)    Current Outpatient Medications  Medication Sig Dispense Refill   azelastine  (OPTIVAR ) 0.05 % ophthalmic solution Place 1 drop into both eyes 2 (two) times daily. 6 mL 2   clotrimazole -betamethasone  (LOTRISONE ) cream Apply 1 Application topically daily. 30 g 1   cyclobenzaprine  (FLEXERIL ) 5 MG tablet Take 1 tablet (5 mg total) by mouth 3 (three) times daily as needed for muscle spasms. 30 tablet 1   FLUoxetine  (PROZAC ) 10 MG tablet Take 1 tablet (10 mg total) by mouth daily. 90 tablet 3   fluticasone  (FLONASE ) 50 MCG/ACT nasal spray SHAKE LIQUID AND USE 2 SPRAYS IN EACH NOSTRIL DAILY 48 g 0   hydrOXYzine  (ATARAX ) 25 MG tablet Take 1 tablet (25 mg total) by mouth at bedtime as needed. 30 tablet 1   ibuprofen  (ADVIL ) 600 MG tablet Take 1 tablet (600 mg total) by mouth every 8 (eight) hours as needed. 30 tablet 0   levocetirizine (XYZAL ) 5 MG tablet Take 1 tablet (5 mg total) by mouth daily as needed for allergies. 15 tablet 1   lisinopril  (ZESTRIL ) 10 MG tablet TAKE 1 TABLET(10 MG) BY MOUTH DAILY 90 tablet 3   loratadine  (CLARITIN ) 10 MG tablet Take 1 tablet (10 mg total) by mouth daily. 30 tablet 11   methylPREDNISolone  (MEDROL  DOSEPAK) 4 MG TBPK tablet Sig as indicated 21 tablet 1   Olopatadine  HCl 0.2 %  SOLN Apply 2 drops to eye every 6 (six) hours as needed. 2.5 mL 1   rosuvastatin  (CRESTOR ) 10 MG tablet Take 1 tablet (10 mg total) by mouth daily. 90 tablet 3   ALPRAZolam  (XANAX ) 0.5 MG tablet Take 1 tablet (0.5 mg total) by mouth 2 (two) times daily as needed for anxiety. 30 tablet 1   No current facility-administered medications for this visit.   Allergies  Allergen Reactions   Macrobid  [Nitrofurantoin ] Nausea And Vomiting   Hydrocodone Nausea And Vomiting    dizzy   Mucinex Dm [Dm-Guaifenesin Er] Swelling    Sob, throat closing   Social History    Socioeconomic History   Marital status: Divorced    Spouse name: Not on file   Number of children: 1   Years of education: 8th grade   Highest education level: Not on file  Occupational History    Employer: UNEMPLOYED  Tobacco Use   Smoking status: Former    Current packs/day: 0.00    Average packs/day: 1 pack/day for 6.0 years (6.0 ttl pk-yrs)    Types: Cigarettes    Start date: 03/09/1974    Quit date: 03/09/1980    Years since quitting: 43.9    Passive exposure: Past   Smokeless tobacco: Never  Vaping Use   Vaping status: Never Used  Substance and Sexual Activity   Alcohol use: Not Currently   Drug use: No   Sexual activity: Yes  Other Topics Concern   Not on file  Social History Narrative   Caffiene coffee 1 cup   Live alone.   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)   Overall Financial Resource Strain (CARDIA)    Difficulty of Paying Living Expenses: Not hard at all  Food Insecurity: No Food Insecurity (06/07/2023)   Hunger Vital Sign    Worried About Running Out of Food in the Last Year: Never true    Ran Out of Food in the Last Year: Never true  Transportation Needs: No Transportation Needs (06/07/2023)   PRAPARE - Administrator, Civil Service (Medical): No    Lack of Transportation (Non-Medical): No  Physical Activity: Inactive (06/07/2023)   Exercise Vital Sign    Days of Exercise per Week: 0 days    Minutes of Exercise per Session: 0 min  Stress: Stress Concern Present (06/07/2023)   [employer] of Occupational Health - Occupational Stress Questionnaire    Feeling of Stress : Rather much  Social Connections: Socially Isolated (06/07/2023)   Social Connection and Isolation Panel    Frequency of Communication with Friends and Family: More than three times a week    Frequency of Social Gatherings with Friends and Family: Never    Attends Religious Services: Never    Database Administrator  or Organizations: No    Attends Banker Meetings: Never    Marital Status: Divorced  Catering Manager Violence: Not At Risk (06/07/2023)   Humiliation, Afraid, Rape, and Kick questionnaire    Fear of Current or Ex-Partner: No    Emotionally Abused: No    Physically Abused: No    Sexually Abused: No   Review of Systems  Constitutional: Negative.  Negative for chills and fever.  HENT:  Positive for congestion. Negative for sore throat.   Respiratory: Negative.  Negative for cough and shortness of breath.   Cardiovascular: Negative.  Negative for chest pain and palpitations.  Gastrointestinal:  Negative for abdominal pain, diarrhea, nausea and vomiting.  Genitourinary: Negative.  Negative for dysuria and hematuria.  Skin: Negative.  Negative for rash.  Neurological: Negative.  Negative for dizziness and headaches.  All other systems reviewed and are negative.  Objective  Alert and oriented x 3 in no apparent respiratory distress Vitals as reported by the patient: There were no vitals filed for this visit. Problem List Items Addressed This Visit       Other   Situational anxiety   Relevant Medications   ALPRAZolam  (XANAX ) 0.5 MG tablet   Anxiety and depression   Currently stable and well-controlled Continues Prozac  10 mg daily and alprazolam  0.5 mg as needed Stress management discussed      Relevant Medications   ALPRAZolam  (XANAX ) 0.5 MG tablet   Chronic anxiety   Presently stable. Uses alprazolam  0.5 mg as needed      Relevant Medications   ALPRAZolam  (XANAX ) 0.5 MG tablet   Chronic throat clearing - Primary   Chronic problem affecting quality of life Family history of throat cancer Recommend ENT evaluation Referral placed today      Relevant Orders   Ambulatory referral to ENT   I personally spent a total of 30 minutes minutes in the care of the patient today including preparing to see the patient, getting/reviewing separately obtained history,  performing a medically appropriate exam/evaluation, counseling and educating, placing orders, referring and communicating with other health care professionals, documenting clinical information in the EHR, coordinating care, and prognosis, symptom management, need for follow-up with ENT doctor..   I discussed the assessment and treatment plan with the patient. The patient was provided an opportunity to ask questions and all were answered. The patient agreed with the plan and demonstrated an understanding of the instructions.   The patient was advised to call back or seek an in-person evaluation if the symptoms worsen or if the condition fails to improve as anticipated.    Dr. Emil Schaumann, MD [facility]

## 2024-02-01 NOTE — Assessment & Plan Note (Signed)
Currently stable and well-controlled Continues Prozac 10 mg daily and alprazolam 0.5 mg as needed Stress management discussed

## 2024-02-01 NOTE — Assessment & Plan Note (Signed)
Presently stable. Uses alprazolam 0.5 mg as needed

## 2024-02-02 ENCOUNTER — Encounter (INDEPENDENT_AMBULATORY_CARE_PROVIDER_SITE_OTHER): Payer: Self-pay

## 2024-02-09 ENCOUNTER — Ambulatory Visit: Admitting: Emergency Medicine

## 2024-02-09 ENCOUNTER — Encounter: Payer: Self-pay | Admitting: Emergency Medicine

## 2024-02-09 ENCOUNTER — Other Ambulatory Visit: Payer: Self-pay | Admitting: Emergency Medicine

## 2024-02-09 VITALS — BP 160/100 | HR 75 | Temp 98.3°F | Ht 60.0 in | Wt 143.0 lb

## 2024-02-09 DIAGNOSIS — J302 Other seasonal allergic rhinitis: Secondary | ICD-10-CM

## 2024-02-09 DIAGNOSIS — K219 Gastro-esophageal reflux disease without esophagitis: Secondary | ICD-10-CM | POA: Insufficient documentation

## 2024-02-09 DIAGNOSIS — F418 Other specified anxiety disorders: Secondary | ICD-10-CM

## 2024-02-09 MED ORDER — LEVOCETIRIZINE DIHYDROCHLORIDE 5 MG PO TABS: 5.0000 mg | ORAL_TABLET | Freq: Every day | ORAL | 1 refills | Status: DC | PRN

## 2024-02-09 MED ORDER — PANTOPRAZOLE SODIUM 40 MG PO TBEC: 40.0000 mg | DELAYED_RELEASE_TABLET | Freq: Every day | ORAL | 3 refills | Status: AC

## 2024-02-09 NOTE — Assessment & Plan Note (Signed)
 Persistent symptoms Recommend trial of pantoprazole 40 mg daily for 2 to 3 weeks Recommend ENT evaluation Referral was placed last week

## 2024-02-09 NOTE — Assessment & Plan Note (Signed)
 Known triggers.  Mostly financial struggles Continues to take alprazolam  0.5 mg as needed.

## 2024-02-09 NOTE — Assessment & Plan Note (Signed)
 Active and affecting quality of life Zyrtec  not working very well Recommend Xyzal  5 mg daily as needed

## 2024-02-09 NOTE — Progress Notes (Signed)
 Katie Gardner 68 y.o.   Chief Complaint  Patient presents with   Sore Throat    Pt states that she is having to clear her throat more than ususal and possible may need to have a swallow test done she says it feels as if something is in her throat and she cannot get it out     HISTORY OF PRESENT ILLNESS: This is a 68 y.o. female complaining of persistent symptoms to her throat with increased mucus and need for throat clearance constantly for the last couple of months No other associated symptoms ENT referral recently placed No other complaints or medical concerns today.  Sore Throat  Pertinent negatives include no congestion, coughing, headaches, shortness of breath or vomiting.     [name]   Medication Sig Start Date End Date Taking? Authorizing Provider  ALPRAZolam  (XANAX ) 0.5 MG tablet Take 1 tablet (0.5 mg total) by mouth 2 (two) times daily as needed for anxiety. 02/01/24  Yes Sadeen Wiegel, Emil Schanz, MD  azelastine  (OPTIVAR ) 0.05 % ophthalmic solution Place 1 drop into both eyes 2 (two) times daily. 01/14/24  Yes Burns, Glade PARAS, MD  clotrimazole -betamethasone  (LOTRISONE ) cream Apply 1 Application topically daily. 02/15/23  Yes Zula Hovsepian, Emil Schanz, MD  cyclobenzaprine  (FLEXERIL ) 5 MG tablet Take 1 tablet (5 mg total) by mouth 3 (three) times daily as needed for muscle spasms. 11/18/23  Yes Khianna Blazina, Emil Schanz, MD  FLUoxetine  (PROZAC ) 10 MG tablet Take 1 tablet (10 mg total) by mouth daily. 11/18/23  Yes Nur Krasinski, Emil Schanz, MD  fluticasone  (FLONASE ) 50 MCG/ACT nasal spray SHAKE LIQUID AND USE 2 SPRAYS IN EACH NOSTRIL DAILY 06/25/22  Yes Henson, Vickie L, NP-C  hydrOXYzine  (ATARAX ) 25 MG tablet Take 1 tablet (25 mg total) by mouth at bedtime as needed. 11/18/23  Yes Fitzhugh Vizcarrondo, Emil Schanz, MD  ibuprofen  (ADVIL ) 600 MG tablet Take 1 tablet (600 mg total) by mouth every 8 (eight) hours as needed. 02/01/24  Yes Caralynn Gelber, Emil Schanz, MD  levocetirizine (XYZAL ) 5 MG  tablet Take 1 tablet (5 mg total) by mouth daily as needed for allergies. 11/18/23  Yes Moya Duan, Emil Schanz, MD  lisinopril  (ZESTRIL ) 10 MG tablet TAKE 1 TABLET(10 MG) BY MOUTH DAILY 11/15/21  Yes Jhair Witherington, Emil Schanz, MD  loratadine  (CLARITIN ) 10 MG tablet Take 1 tablet (10 mg total) by mouth daily. 11/18/23  Yes Sidni Fusco, Emil Schanz, MD  methylPREDNISolone  (MEDROL  DOSEPAK) 4 MG TBPK tablet Sig as indicated 12/23/23  Yes Jaymes Hang, Emil Schanz, MD  Olopatadine  HCl 0.2 % SOLN Apply 2 drops to eye every 6 (six) hours as needed. 06/23/23  Yes Mirissa Lopresti, Emil Schanz, MD  rosuvastatin  (CRESTOR ) 10 MG tablet Take 1 tablet (10 mg total) by mouth daily. 07/22/23  Yes Purcell Emil Schanz, MD    Allergies  Allergen Reactions   Macrobid  [Nitrofurantoin ] Nausea And Vomiting   Hydrocodone Nausea And Vomiting    dizzy   Mucinex Dm [Dm-Guaifenesin Er] Swelling    Sob, throat closing    Patient Active Problem List   Diagnosis Date Noted   Chronic throat clearing 02/01/2024   Bacterial sinusitis 01/06/2024   Upper respiratory infection, viral 02/15/2023   Suspected sleep apnea 06/09/2022   Chronic pain of left knee 02/17/2022   Chronic insomnia 05/31/2019   Essential hypertension 12/17/2017   Seasonal allergic rhinitis 12/17/2017   Anxiety and depression 11/13/2016   Chronic anxiety 11/13/2016   Situational anxiety    Dyslipidemia     Past Medical History:  Diagnosis  Date   Allergy    seasonal   Anxiety    Depression    Family history of adverse reaction to anesthesia    sister hard to wake up   Hematuria    Hot flashes    Hx of cardiovascular stress test    Lex MV 12/13:  EF 68%, mild apical thinning, no ischemia   Hyperlipidemia    Hypertension    PONV (postoperative nausea and vomiting)     Past Surgical History:  Procedure Laterality Date   BREAST LUMPECTOMY     right breast, not cancer   Carpel tunnel     ECTOPIC PREGNANCY SURGERY      Social History   Socioeconomic  History   Marital status: Divorced    Spouse name: Not on file   Number of children: 1   Years of education: 8th grade   Highest education level: Not on file  Occupational History    Employer: UNEMPLOYED  Tobacco Use   Smoking status: Former    Current packs/day: 0.00    Average packs/day: 1 pack/day for 6.0 years (6.0 ttl pk-yrs)    Types: Cigarettes    Start date: 03/09/1974    Quit date: 03/09/1980    Years since quitting: 43.9    Passive exposure: Past   Smokeless tobacco: Never  Vaping Use   Vaping status: Never Used  Substance and Sexual Activity   Alcohol use: Not Currently   Drug use: No   Sexual activity: Yes  Other Topics Concern   Not on file  Social History Narrative   Caffiene coffee 1 cup   Live alone.   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)   Overall Financial Resource Strain (CARDIA)    Difficulty of Paying Living Expenses: Not hard at all  Food Insecurity: No Food Insecurity (06/07/2023)   Hunger Vital Sign    Worried About Running Out of Food in the Last Year: Never true    Ran Out of Food in the Last Year: Never true  Transportation Needs: No Transportation Needs (06/07/2023)   PRAPARE - Administrator, Civil Service (Medical): No    Lack of Transportation (Non-Medical): No  Physical Activity: Inactive (06/07/2023)   Exercise Vital Sign    Days of Exercise per Week: 0 days    Minutes of Exercise per Session: 0 min  Stress: Stress Concern Present (06/07/2023)   [employer] of Occupational Health - Occupational Stress Questionnaire    Feeling of Stress : Rather much  Social Connections: Socially Isolated (06/07/2023)   Social Connection and Isolation Panel    Frequency of Communication with Friends and Family: More than three times a week    Frequency of Social Gatherings with Friends and Family: Never    Attends Religious Services: Never    Database Administrator or Organizations:  No    Attends Banker Meetings: Never    Marital Status: Divorced  Catering Manager Violence: Not At Risk (06/07/2023)   Humiliation, Afraid, Rape, and Kick questionnaire    Fear of Current or Ex-Partner: No    Emotionally Abused: No    Physically Abused: No    Sexually Abused: No    Family History  Problem Relation Age of Onset   Heart disease Mother    Diabetes Mother    Esophageal cancer Father        79's   Diabetes Sister  Diabetes Sister    Colon cancer Neg Hx      Review of Systems  Constitutional: Negative.  Negative for chills and fever.  HENT: Negative.  Negative for congestion and sore throat.   Respiratory: Negative.  Negative for cough and shortness of breath.   Cardiovascular: Negative.  Negative for chest pain and palpitations.  Gastrointestinal:  Negative for nausea and vomiting.  Genitourinary: Negative.  Negative for dysuria and hematuria.  Skin: Negative.  Negative for rash.  Neurological: Negative.  Negative for dizziness and headaches.  Endo/Heme/Allergies:  Positive for environmental allergies.  All other systems reviewed and are negative.   Vitals:   02/09/24 1056  BP: (!) 160/100  Pulse: 75  Temp: 98.3 F (36.8 C)  SpO2: 97%    Physical Exam Vitals reviewed.  Constitutional:      Appearance: Normal appearance.  HENT:     Head: Normocephalic.     Mouth/Throat:     Mouth: Mucous membranes are moist.     Pharynx: Oropharynx is clear.  Eyes:     Extraocular Movements: Extraocular movements intact.     Conjunctiva/sclera: Conjunctivae normal.     Pupils: Pupils are equal, round, and reactive to light.  Cardiovascular:     Rate and Rhythm: Normal rate and regular rhythm.     Pulses: Normal pulses.     Heart sounds: Normal heart sounds.  Musculoskeletal:     Cervical back: No tenderness.  Lymphadenopathy:     Cervical: No cervical adenopathy.  Skin:    General: Skin is warm and dry.  Neurological:     Mental Status:  She is alert and oriented to person, place, and time.  Psychiatric:        Mood and Affect: Mood normal.        Behavior: Behavior normal.      ASSESSMENT & PLAN: Problem List Items Addressed This Visit       Respiratory   Seasonal allergic rhinitis   Active and affecting quality of life Zyrtec  not working very well Recommend Xyzal  5 mg daily as needed         Relevant Medications   levocetirizine (XYZAL ) 5 MG tablet   Laryngopharyngeal reflux - Primary   Persistent symptoms Recommend trial of pantoprazole 40 mg daily for 2 to 3 weeks Recommend ENT evaluation Referral was placed last week      Relevant Medications   pantoprazole (PROTONIX) 40 MG tablet     Other   Situational anxiety   Known triggers.  Mostly financial struggles Continues to take alprazolam  0.5 mg as needed.      Patient Instructions  Health Maintenance After Age 68 After age 67, you are at a higher risk for certain long-term diseases and infections as well as injuries from falls. Falls are a major cause of broken bones and head injuries in people who are older than age 68. Getting regular preventive care can help to keep you healthy and well. Preventive care includes getting regular testing and making lifestyle changes as recommended by your health care provider. Talk with your health care provider about: Which screenings and tests you should have. A screening is a test that checks for a disease when you have no symptoms. A diet and exercise plan that is right for you. What should I know about screenings and tests to prevent falls? Screening and testing are the best ways to find a health problem early. Early diagnosis and treatment give you the best chance of managing  medical conditions that are common after age 27. Certain conditions and lifestyle choices may make you more likely to have a fall. Your health care provider may recommend: Regular vision checks. Poor vision and conditions such as  cataracts can make you more likely to have a fall. If you wear glasses, make sure to get your prescription updated if your vision changes. Medicine review. Work with your health care provider to regularly review all of the medicines you are taking, including over-the-counter medicines. Ask your health care provider about any side effects that may make you more likely to have a fall. Tell your health care provider if any medicines that you take make you feel dizzy or sleepy. Strength and balance checks. Your health care provider may recommend certain tests to check your strength and balance while standing, walking, or changing positions. Foot health exam. Foot pain and numbness, as well as not wearing proper footwear, can make you more likely to have a fall. Screenings, including: Osteoporosis screening. Osteoporosis is a condition that causes the bones to get weaker and break more easily. Blood pressure screening. Blood pressure changes and medicines to control blood pressure can make you feel dizzy. Depression screening. You may be more likely to have a fall if you have a fear of falling, feel depressed, or feel unable to do activities that you used to do. Alcohol use screening. Using too much alcohol can affect your balance and may make you more likely to have a fall. Follow these instructions at home: Lifestyle Do not drink alcohol if: Your health care provider tells you not to drink. If you drink alcohol: Limit how much you have to: 0-1 drink a day for women. 0-2 drinks a day for men. Know how much alcohol is in your drink. In the [place] one drink equals one 12 oz bottle of beer (355 mL), one 5 oz glass of wine (148 mL), or one 1 oz glass of hard liquor (44 mL). Do not use any products that contain nicotine or tobacco. These products include cigarettes, chewing tobacco, and vaping devices, such as e-cigarettes. If you need help quitting, ask your health care provider. Activity  Follow a  regular exercise program to stay fit. This will help you maintain your balance. Ask your health care provider what types of exercise are appropriate for you. If you need a cane or walker, use it as recommended by your health care provider. Wear supportive shoes that have nonskid soles. Safety  Remove any tripping hazards, such as rugs, cords, and clutter. Install safety equipment such as grab bars in bathrooms and safety rails on stairs. Keep rooms and walkways well-lit. General instructions Talk with your health care provider about your risks for falling. Tell your health care provider if: You fall. Be sure to tell your health care provider about all falls, even ones that seem minor. You feel dizzy, tiredness (fatigue), or off-balance. Take over-the-counter and prescription medicines only as told by your health care provider. These include supplements. Eat a healthy diet and maintain a healthy weight. A healthy diet includes low-fat dairy products, low-fat (lean) meats, and fiber from whole grains, beans, and lots of fruits and vegetables. Stay current with your vaccines. Schedule regular health, dental, and eye exams. Summary Having a healthy lifestyle and getting preventive care can help to protect your health and wellness after age 60. Screening and testing are the best way to find a health problem early and help you avoid having a fall. Early diagnosis and  treatment give you the best chance for managing medical conditions that are more common for people who are older than age 31. Falls are a major cause of broken bones and head injuries in people who are older than age 64. Take precautions to prevent a fall at home. Work with your health care provider to learn what changes you can make to improve your health and wellness and to prevent falls. This information is not intended to replace advice given to you by your health care provider. Make sure you discuss any questions you have with your  health care provider. Document Revised: 07/15/2020 Document Reviewed: 07/15/2020 Elsevier Patient Education  2024 [employer]    Emil Schaumann, MD  Primary Care at [facility]

## 2024-02-09 NOTE — Patient Instructions (Signed)
 Health Maintenance After Age 68 After age 27, you are at a higher risk for certain long-term diseases and infections as well as injuries from falls. Falls are a major cause of broken bones and head injuries in people who are older than age 73. Getting regular preventive care can help to keep you healthy and well. Preventive care includes getting regular testing and making lifestyle changes as recommended by your health care provider. Talk with your health care provider about: Which screenings and tests you should have. A screening is a test that checks for a disease when you have no symptoms. A diet and exercise plan that is right for you. What should I know about screenings and tests to prevent falls? Screening and testing are the best ways to find a health problem early. Early diagnosis and treatment give you the best chance of managing medical conditions that are common after age 90. Certain conditions and lifestyle choices may make you more likely to have a fall. Your health care provider may recommend: Regular vision checks. Poor vision and conditions such as cataracts can make you more likely to have a fall. If you wear glasses, make sure to get your prescription updated if your vision changes. Medicine review. Work with your health care provider to regularly review all of the medicines you are taking, including over-the-counter medicines. Ask your health care provider about any side effects that may make you more likely to have a fall. Tell your health care provider if any medicines that you take make you feel dizzy or sleepy. Strength and balance checks. Your health care provider may recommend certain tests to check your strength and balance while standing, walking, or changing positions. Foot health exam. Foot pain and numbness, as well as not wearing proper footwear, can make you more likely to have a fall. Screenings, including: Osteoporosis screening. Osteoporosis is a condition that causes  the bones to get weaker and break more easily. Blood pressure screening. Blood pressure changes and medicines to control blood pressure can make you feel dizzy. Depression screening. You may be more likely to have a fall if you have a fear of falling, feel depressed, or feel unable to do activities that you used to do. Alcohol  use screening. Using too much alcohol  can affect your balance and may make you more likely to have a fall. Follow these instructions at home: Lifestyle Do not drink alcohol  if: Your health care provider tells you not to drink. If you drink alcohol : Limit how much you have to: 0-1 drink a day for women. 0-2 drinks a day for men. Know how much alcohol  is in your drink. In the [place] one drink equals one 12 oz bottle of beer (355 mL), one 5 oz glass of wine (148 mL), or one 1 oz glass of hard liquor (44 mL). Do not use any products that contain nicotine or tobacco. These products include cigarettes, chewing tobacco, and vaping devices, such as e-cigarettes. If you need help quitting, ask your health care provider. Activity  Follow a regular exercise program to stay fit. This will help you maintain your balance. Ask your health care provider what types of exercise are appropriate for you. If you need a cane or walker, use it as recommended by your health care provider. Wear supportive shoes that have nonskid soles. Safety  Remove any tripping hazards, such as rugs, cords, and clutter. Install safety equipment such as grab bars in bathrooms and safety rails on stairs. Keep rooms and walkways  well-lit. General instructions Talk with your health care provider about your risks for falling. Tell your health care provider if: You fall. Be sure to tell your health care provider about all falls, even ones that seem minor. You feel dizzy, tiredness (fatigue), or off-balance. Take over-the-counter and prescription medicines only as told by your health care provider. These include  supplements. Eat a healthy diet and maintain a healthy weight. A healthy diet includes low-fat dairy products, low-fat (lean) meats, and fiber from whole grains, beans, and lots of fruits and vegetables. Stay current with your vaccines. Schedule regular health, dental, and eye exams. Summary Having a healthy lifestyle and getting preventive care can help to protect your health and wellness after age 15. Screening and testing are the best way to find a health problem early and help you avoid having a fall. Early diagnosis and treatment give you the best chance for managing medical conditions that are more common for people who are older than age 42. Falls are a major cause of broken bones and head injuries in people who are older than age 64. Take precautions to prevent a fall at home. Work with your health care provider to learn what changes you can make to improve your health and wellness and to prevent falls. This information is not intended to replace advice given to you by your health care provider. Make sure you discuss any questions you have with your health care provider. Document Revised: 07/15/2020 Document Reviewed: 07/15/2020 Elsevier Patient Education  2024 [employer].

## 2024-02-24 ENCOUNTER — Telehealth: Payer: Self-pay | Admitting: Emergency Medicine

## 2024-02-24 NOTE — Telephone Encounter (Signed)
 Noted

## 2024-02-24 NOTE — Telephone Encounter (Signed)
 Copied from CRM #[id number]. Topic: Clinical - Prescription Issue >> Feb 24, 2024  1:52 PM Nessti S wrote: Reason for CRM: ALPRAZolam  (XANAX ) 0.5 MG tablet can increase the risk for confusion and fall leave message for care team to reevaluate for deprescribing and reducing or switching to a safer alt

## 2024-02-24 NOTE — Telephone Encounter (Signed)
 Please advise

## 2024-02-24 NOTE — Telephone Encounter (Signed)
 Okay for her to take this medication.

## 2024-03-20 ENCOUNTER — Institutional Professional Consult (permissible substitution) (INDEPENDENT_AMBULATORY_CARE_PROVIDER_SITE_OTHER)

## 2024-03-23 ENCOUNTER — Encounter: Payer: Self-pay | Admitting: Emergency Medicine

## 2024-03-23 ENCOUNTER — Ambulatory Visit: Admitting: Emergency Medicine

## 2024-03-23 ENCOUNTER — Ambulatory Visit: Payer: Self-pay

## 2024-03-23 VITALS — BP 122/80 | HR 83 | Temp 98.1°F | Ht 60.0 in | Wt 140.0 lb

## 2024-03-23 DIAGNOSIS — R42 Dizziness and giddiness: Secondary | ICD-10-CM | POA: Diagnosis not present

## 2024-03-23 LAB — CBC WITH DIFFERENTIAL/PLATELET
Basophils Absolute: 0 K/uL (ref 0.0–0.1)
Basophils Relative: 0.6 % (ref 0.0–3.0)
Eosinophils Absolute: 0.1 K/uL (ref 0.0–0.7)
Eosinophils Relative: 1 % (ref 0.0–5.0)
HCT: 40 % (ref 36.0–46.0)
Hemoglobin: 13.9 g/dL (ref 12.0–15.0)
Lymphocytes Relative: 44.3 % (ref 12.0–46.0)
Lymphs Abs: 2.4 K/uL (ref 0.7–4.0)
MCHC: 34.7 g/dL (ref 30.0–36.0)
MCV: 86.1 fl (ref 78.0–100.0)
Monocytes Absolute: 0.5 K/uL (ref 0.1–1.0)
Monocytes Relative: 9.1 % (ref 3.0–12.0)
Neutro Abs: 2.4 K/uL (ref 1.4–7.7)
Neutrophils Relative %: 45 % (ref 43.0–77.0)
Platelets: 223 K/uL (ref 150.0–400.0)
RBC: 4.65 Mil/uL (ref 3.87–5.11)
RDW: 13.1 % (ref 11.5–15.5)
WBC: 5.4 K/uL (ref 4.0–10.5)

## 2024-03-23 LAB — COMPREHENSIVE METABOLIC PANEL WITH GFR
ALT: 16 U/L (ref 3–35)
AST: 15 U/L (ref 5–37)
Albumin: 4.3 g/dL (ref 3.5–5.2)
Alkaline Phosphatase: 79 U/L (ref 39–117)
BUN: 17 mg/dL (ref 6–23)
CO2: 29 meq/L (ref 19–32)
Calcium: 9.1 mg/dL (ref 8.4–10.5)
Chloride: 104 meq/L (ref 96–112)
Creatinine, Ser: 0.72 mg/dL (ref 0.40–1.20)
GFR: 85.98 mL/min
Glucose, Bld: 95 mg/dL (ref 70–99)
Potassium: 3.9 meq/L (ref 3.5–5.1)
Sodium: 138 meq/L (ref 135–145)
Total Bilirubin: 0.3 mg/dL (ref 0.2–1.2)
Total Protein: 7.1 g/dL (ref 6.0–8.3)

## 2024-03-23 NOTE — Telephone Encounter (Signed)
 FYI Only or Action Required?: FYI only for provider: appointment scheduled on today.  Patient was last seen in primary care on 02/09/2024 by Purcell Emil Schanz, MD.  Called Nurse Triage reporting Dizziness.  Symptoms began several weeks ago.  Interventions attempted: Rest, hydration, or home remedies.  Symptoms are: unchanged.  Triage Disposition: See PCP When Office is Open (Within 3 Days)  Patient/caregiver understands and will follow disposition?: Yes Reason for Disposition  [1] MODERATE dizziness (e.g., interferes with normal activities) AND [2] has been evaluated by doctor (or NP/PA) for this  Answer Assessment - Initial Assessment Questions 2-3 weeks of dizziness. Went to urgent care-, she reports they said the cause was inner ear. Calling today to scheduled for persistent intermittent vertigo for 2-3 weeks, never feels faint.  Acute scheduled today, advised to have someone drive her since she is experiencing intermittent vertigo without warning  1. DESCRIPTION: Describe your dizziness.     Spinning room  2. LIGHTHEADED: Do you feel lightheaded? (e.g., somewhat faint, woozy, weak upon standing)     no 3. VERTIGO: Do you feel like either you or the room is spinning or tilting? (i.e., vertigo)     spinning 4. SEVERITY: How bad is it?  Do you feel like you are going to faint? Can you stand and walk?     Does not feel faint 5. ONSET:  When did the dizziness begin?     2-3 weeks ago  6. AGGRAVATING FACTORS: Does anything make it worse? (e.g., standing, change in head position)      7. HEART RATE: Can you tell me your heart rate? How many beats in 15 seconds?  (Note: Not all patients can do this.)       Feels normal  8. CAUSE: What do you think is causing the dizziness? (e.g., decreased fluids or food, diarrhea, emotional distress, heat exposure, new medicine, sudden standing, vomiting; unknown)     ear 9. RECURRENT SYMPTOM: Have you had dizziness  before? If Yes, ask: When was the last time? What happened that time?     Intermittent ongoing for 2-3 weeks.  10. OTHER SYMPTOMS: Do you have any other symptoms? (e.g., fever, chest pain, vomiting, diarrhea, bleeding)       Denies  Protocols used: Dizziness - Lightheadedness-A-AH Copied from CRM #[id number]. Topic: Clinical - Red Word Triage >> Mar 23, 2024 11:57 AM Emylou G wrote: [facility] that prompted transfer to Nurse Triage: dizziness

## 2024-03-23 NOTE — Progress Notes (Signed)
 ??? ?? ????? ?? ?? ? ?? ??  ??? ???  6???? ?? ????? ??? ?????. 6??? ???? ??? ?? ? ?? ?? ??? ??????

## 2024-03-23 NOTE — Patient Instructions (Signed)
 Dizziness Dizziness is a common problem. It makes you feel unsteady or light-headed. You may feel like you're about to faint. Dizziness can lead to getting hurt if you stumble or fall. It's more common to feel dizzy if you're an older adult. Many things can cause you to feel dizzy. These include: Medicines. Dehydration. This is when there's not enough water in your body. Illness. Follow these instructions at home: Eating and drinking  Drink enough fluid to keep your pee (urine) pale yellow. This helps keep you from getting dehydrated. Try to drink more clear fluids, such as water. Do not drink alcohol. Try to limit how much caffeine you take in. Try to limit how much salt, also called sodium, you take in. Activity Try not to make quick movements. Stand up slowly from sitting in a chair. Steady yourself until you feel okay. In the morning, first sit up on the side of the bed. When you feel okay, hold onto something and slowly stand up. Do this until you know that your balance is okay. If you need to stand in one place for a long time, move your legs often. Tighten and relax the muscles in your legs while you're standing. Do not drive or use machines if you feel dizzy. Avoid bending down if you feel dizzy. Place items in your home so you can reach them without leaning over. Lifestyle Do not smoke, vape, or use products with nicotine or tobacco in them. If you need help quitting, talk with your health care provider. Try to lower your stress level. You can do this by using methods like yoga or meditation. Talk with your provider if you need help. General instructions Watch your dizziness for any changes. Take your medicines only as told by your provider. Talk with your provider if you think you're dizzy because of a medicine you're taking. Tell a friend or a family member that you're feeling dizzy. If they spot any changes in your behavior, have them call your provider. Contact a health care  provider if: Your dizziness doesn't go away, or you have new symptoms. Your dizziness gets worse. You feel like you may vomit. You have trouble hearing. You have a fever. You have neck pain or a stiff neck. You fall or get hurt. Get help right away if: You vomit each time you eat or drink. You have watery poop and can't eat or drink. You have trouble talking, walking, swallowing, or using your arms, hands, or legs. You feel very weak. You're bleeding. You're not thinking clearly, or you have trouble forming sentences. A friend or family member may spot this. Your vision changes, or you get a very bad headache. These symptoms may be an emergency. Call 911 right away. Do not wait to see if the symptoms will go away. Do not drive yourself to the hospital. This information is not intended to replace advice given to you by your health care provider. Make sure you discuss any questions you have with your health care provider. Document Revised: 11/26/2022 Document Reviewed: 04/09/2022 Elsevier Patient Education  2024 [employer].

## 2024-03-24 ENCOUNTER — Ambulatory Visit: Payer: Self-pay | Admitting: Emergency Medicine

## 2024-03-24 NOTE — Progress Notes (Signed)
 Katie Gardner 69 y.o.   Chief Complaint  Patient presents with   Dizziness    HISTORY OF PRESENT ILLNESS: This is a 69 y.o. female complaining of intermittent episodes of dizziness for the past several days. Had a recent URI symptoms No other associated symptoms No other complaints or medical concerns today.  Dizziness Associated symptoms include congestion. Pertinent negatives include no abdominal pain, chest pain, chills, coughing, fever, nausea, rash or vomiting.     [name]  Medication Sig Start Date End Date Taking? Authorizing Provider  ALPRAZolam  (XANAX ) 0.5 MG tablet Take 1 tablet (0.5 mg total) by mouth 2 (two) times daily as needed for anxiety. 02/01/24  Yes Pailyn Bellevue, Emil Schanz, MD  azelastine  (OPTIVAR ) 0.05 % ophthalmic solution Place 1 drop into both eyes 2 (two) times daily. 01/14/24  Yes Burns, Glade PARAS, MD  clotrimazole -betamethasone  (LOTRISONE ) cream Apply 1 Application topically daily. 02/15/23  Yes Aolanis Crispen, Emil Schanz, MD  cyclobenzaprine  (FLEXERIL ) 5 MG tablet Take 1 tablet (5 mg total) by mouth 3 (three) times daily as needed for muscle spasms. 11/18/23  Yes Talibah Colasurdo, Emil Schanz, MD  FLUoxetine  (PROZAC ) 10 MG tablet Take 1 tablet (10 mg total) by mouth daily. 11/18/23  Yes Cesia Orf, Emil Schanz, MD  fluticasone  (FLONASE ) 50 MCG/ACT nasal spray SHAKE LIQUID AND USE 2 SPRAYS IN EACH NOSTRIL DAILY 06/25/22  Yes Henson, Vickie L, NP-C  ibuprofen  (ADVIL ) 600 MG tablet Take 1 tablet (600 mg total) by mouth every 8 (eight) hours as needed. 02/01/24  Yes Aracelia Brinson Jose, MD  levocetirizine (XYZAL ) 5 MG tablet TAKE 1 TABLET(5 MG) BY MOUTH DAILY AS NEEDED FOR ALLERGIES 02/09/24  Yes Galo Sayed, Emil Schanz, MD  lisinopril  (ZESTRIL ) 10 MG tablet TAKE 1 TABLET(10 MG) BY MOUTH DAILY 11/15/21  Yes Armonie Mettler, Emil Schanz, MD  methylPREDNISolone  (MEDROL  DOSEPAK) 4 MG TBPK tablet Sig as indicated 12/23/23  Yes Brigit Doke, Emil Schanz, MD  Olopatadine  HCl 0.2 %  SOLN Apply 2 drops to eye every 6 (six) hours as needed. 06/23/23  Yes Duff Pozzi, Emil Schanz, MD  pantoprazole  (PROTONIX ) 40 MG tablet Take 1 tablet (40 mg total) by mouth daily. 02/09/24  Yes Jany Buckwalter, Emil Schanz, MD  rosuvastatin  (CRESTOR ) 10 MG tablet Take 1 tablet (10 mg total) by mouth daily. 07/22/23  Yes Purcell Emil Schanz, MD    Allergies[1]  Patient Active Problem List   Diagnosis Date Noted   Laryngopharyngeal reflux 02/09/2024   Chronic throat clearing 02/01/2024   Suspected sleep apnea 06/09/2022   Chronic pain of left knee 02/17/2022   Chronic insomnia 05/31/2019   Essential hypertension 12/17/2017   Seasonal allergic rhinitis 12/17/2017   Anxiety and depression 11/13/2016   Chronic anxiety 11/13/2016   Situational anxiety    Dyslipidemia     Past Medical History:  Diagnosis Date   Allergy    seasonal   Anxiety    Depression    Family history of adverse reaction to anesthesia    sister hard to wake up   Hematuria    Hot flashes    Hx of cardiovascular stress test    Lex MV 12/13:  EF 68%, mild apical thinning, no ischemia   Hyperlipidemia    Hypertension    PONV (postoperative nausea and vomiting)     Past Surgical History:  Procedure Laterality Date   BREAST LUMPECTOMY     right breast, not cancer   Carpel tunnel     ECTOPIC PREGNANCY SURGERY      Social History  Socioeconomic History   Marital status: Divorced    Spouse name: Not on file   Number of children: 1   Years of education: 8th grade   Highest education level: Not on file  Occupational History    Employer: UNEMPLOYED  Tobacco Use   Smoking status: Former    Current packs/day: 0.00    Average packs/day: 1 pack/day for 6.0 years (6.0 ttl pk-yrs)    Types: Cigarettes    Start date: 03/09/1974    Quit date: 03/09/1980    Years since quitting: 44.0    Passive exposure: Past   Smokeless tobacco: Never  Vaping Use   Vaping status: Never Used  Substance and Sexual Activity   Alcohol  use: Not Currently   Drug use: No   Sexual activity: Yes  Other Topics Concern   Not on file  Social History Narrative   Caffiene coffee 1 cup   Live alone.   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   Tobacco Use: Medium Risk (03/23/2024)   Patient History    Smoking Tobacco Use: Former    Smokeless Tobacco Use: Never    Passive Exposure: Past  Physicist, Medical Strain: Low Risk (06/07/2023)   Overall Financial Resource Strain (CARDIA)    Difficulty of Paying Living Expenses: Not hard at all  Food Insecurity: No Food Insecurity (06/07/2023)   Hunger Vital Sign    Worried About Running Out of Food in the Last Year: Never true    Ran Out of Food in the Last Year: Never true  Transportation Needs: No Transportation Needs (06/07/2023)   PRAPARE - Administrator, Civil Service (Medical): No    Lack of Transportation (Non-Medical): No  Physical Activity: Inactive (06/07/2023)   Exercise Vital Sign    Days of Exercise per Week: 0 days    Minutes of Exercise per Session: 0 min  Stress: Stress Concern Present (06/07/2023)   [employer] of Occupational Health - Occupational Stress Questionnaire    Feeling of Stress : Rather much  Social Connections: Socially Isolated (06/07/2023)   Social Connection and Isolation Panel    Frequency of Communication with Friends and Family: More than three times a week    Frequency of Social Gatherings with Friends and Family: Never    Attends Religious Services: Never    Database Administrator or Organizations: No    Attends Banker Meetings: Never    Marital Status: Divorced  Catering Manager Violence: Not At Risk (06/07/2023)   Humiliation, Afraid, Rape, and Kick questionnaire    Fear of Current or Ex-Partner: No    Emotionally Abused: No    Physically Abused: No    Sexually Abused: No  Depression (PHQ2-9): Low Risk (03/23/2024)   Depression (PHQ2-9)    PHQ-2 Score: 0  Alcohol Screen: Low Risk (06/07/2023)   Alcohol  Screen    Last Alcohol Screening Score (AUDIT): 0  Housing: Unknown (06/07/2023)   Housing Stability Vital Sign    Unable to Pay for Housing in the Last Year: No    Number of Times Moved in the Last Year: Not on file    Homeless in the Last Year: No  Utilities: Not At Risk (06/07/2023)   [facility] Utilities    Threatened with loss of utilities: No  Health Literacy: Adequate Health Literacy (06/07/2023)   B1300 Health Literacy    Frequency of need for help with medical instructions: Never    Family History  Problem Relation  Age of Onset   Heart disease Mother    Diabetes Mother    Esophageal cancer Father        38's   Diabetes Sister    Diabetes Sister    Colon cancer Neg Hx      Review of Systems  Constitutional: Negative.  Negative for chills and fever.  HENT:  Positive for congestion.   Respiratory: Negative.  Negative for cough and shortness of breath.   Cardiovascular: Negative.  Negative for chest pain and palpitations.  Gastrointestinal:  Negative for abdominal pain, diarrhea, nausea and vomiting.  Genitourinary: Negative.  Negative for dysuria and hematuria.  Skin: Negative.  Negative for rash.  Neurological:  Positive for dizziness.  All other systems reviewed and are negative.   Vitals:   03/23/24 1528  BP: 122/80  Pulse: 83  Temp: 98.1 F (36.7 C)  SpO2: 98%    Physical Exam Vitals reviewed.  Constitutional:      Appearance: Normal appearance.  HENT:     Head: Normocephalic.     Right Ear: Tympanic membrane, ear canal and external ear normal.     Left Ear: Tympanic membrane, ear canal and external ear normal.     Mouth/Throat:     Mouth: Mucous membranes are moist.     Pharynx: Oropharynx is clear.  Eyes:     Extraocular Movements: Extraocular movements intact.     Conjunctiva/sclera: Conjunctivae normal.     Pupils: Pupils are equal, round, and reactive to light.  Cardiovascular:     Rate and Rhythm: Normal rate and regular rhythm.     Pulses:  Normal pulses.     Heart sounds: Normal heart sounds.  Pulmonary:     Effort: Pulmonary effort is normal.     Breath sounds: Normal breath sounds.  Abdominal:     Palpations: Abdomen is soft.     Tenderness: There is no abdominal tenderness.  Musculoskeletal:     Cervical back: No tenderness.  Lymphadenopathy:     Cervical: No cervical adenopathy.  Skin:    General: Skin is warm and dry.     Capillary Refill: Capillary refill takes less than 2 seconds.  Neurological:     General: No focal deficit present.     Mental Status: She is alert and oriented to person, place, and time.  Psychiatric:        Mood and Affect: Mood normal.        Behavior: Behavior normal.      ASSESSMENT & PLAN: Problem List Items Addressed This Visit       Other   Dizziness - Primary   Clinically stable.  No red flag signs or symptoms. Differential diagnosis discussed. Unremarkable examination. Recommend blood work today Advised to rest and stay well-hydrated. Advised to contact the office if no better or worse during the next several days.      Relevant Orders   Comprehensive metabolic panel with GFR (Completed)   CBC with Differential/Platelet (Completed)   Patient Instructions  Dizziness Dizziness is a common problem. It makes you feel unsteady or light-headed. You may feel like you're about to faint. Dizziness can lead to getting hurt if you stumble or fall. It's more common to feel dizzy if you're an older adult. Many things can cause you to feel dizzy. These include: Medicines. Dehydration. This is when there's not enough water in your body. Illness. Follow these instructions at home: Eating and drinking  Drink enough fluid to keep your pee (urine)  pale yellow. This helps keep you from getting dehydrated. Try to drink more clear fluids, such as water. Do not drink alcohol. Try to limit how much caffeine you take in. Try to limit how much salt, also called sodium, you take  in. Activity Try not to make quick movements. Stand up slowly from sitting in a chair. Steady yourself until you feel okay. In the morning, first sit up on the side of the bed. When you feel okay, hold onto something and slowly stand up. Do this until you know that your balance is okay. If you need to stand in one place for a long time, move your legs often. Tighten and relax the muscles in your legs while you're standing. Do not drive or use machines if you feel dizzy. Avoid bending down if you feel dizzy. Place items in your home so you can reach them without leaning over. Lifestyle Do not smoke, vape, or use products with nicotine or tobacco in them. If you need help quitting, talk with your health care provider. Try to lower your stress level. You can do this by using methods like yoga or meditation. Talk with your provider if you need help. General instructions Watch your dizziness for any changes. Take your medicines only as told by your provider. Talk with your provider if you think you're dizzy because of a medicine you're taking. Tell a friend or a family member that you're feeling dizzy. If they spot any changes in your behavior, have them call your provider. Contact a health care provider if: Your dizziness doesn't go away, or you have new symptoms. Your dizziness gets worse. You feel like you may vomit. You have trouble hearing. You have a fever. You have neck pain or a stiff neck. You fall or get hurt. Get help right away if: You vomit each time you eat or drink. You have watery poop and can't eat or drink. You have trouble talking, walking, swallowing, or using your arms, hands, or legs. You feel very weak. You're bleeding. You're not thinking clearly, or you have trouble forming sentences. A friend or family member may spot this. Your vision changes, or you get a very bad headache. These symptoms may be an emergency. Call 911 right away. Do not wait to see if the  symptoms will go away. Do not drive yourself to the hospital. This information is not intended to replace advice given to you by your health care provider. Make sure you discuss any questions you have with your health care provider. Document Revised: 11/26/2022 Document Reviewed: 04/09/2022 Elsevier Patient Education  2024 Elsevier Inc[employer]     Emil Schaumann, MD [facility]     [1]  Allergies Allergen Reactions   Macrobid  [Nitrofurantoin ] Nausea And Vomiting   Hydrocodone Nausea And Vomiting    dizzy   Mucinex Dm [Dm-Guaifenesin Er] Swelling    Sob, throat closing

## 2024-03-24 NOTE — Assessment & Plan Note (Signed)
 Clinically stable.  No red flag signs or symptoms. Differential diagnosis discussed. Unremarkable examination. Recommend blood work today Advised to rest and stay well-hydrated. Advised to contact the office if no better or worse during the next several days.

## 2024-03-24 NOTE — Telephone Encounter (Signed)
 I still need to review the results.  I will send her a MyChart message when done.

## 2024-03-27 ENCOUNTER — Telehealth: Payer: Self-pay

## 2024-03-27 ENCOUNTER — Other Ambulatory Visit: Payer: Self-pay | Admitting: Emergency Medicine

## 2024-03-27 DIAGNOSIS — F418 Other specified anxiety disorders: Secondary | ICD-10-CM

## 2024-03-27 DIAGNOSIS — F419 Anxiety disorder, unspecified: Secondary | ICD-10-CM

## 2024-03-27 MED ORDER — ALPRAZOLAM 0.5 MG PO TABS: 0.5000 mg | ORAL_TABLET | Freq: Two times a day (BID) | ORAL | 1 refills | Status: AC | PRN

## 2024-03-27 NOTE — Telephone Encounter (Signed)
 Please advise.

## 2024-03-27 NOTE — Telephone Encounter (Signed)
New prescription sent to pharmacy requested today.

## 2024-03-27 NOTE — Telephone Encounter (Signed)
 Copied from CRM [id number]. Topic: Clinical - Medication Refill >> Mar 27, 2024 10:53 AM Berneda FALCON wrote: Medication: ALPRAZolam  (XANAX ) 0.5 MG tablet  Has the patient contacted their pharmacy? Yes (Agent: If no, request that the patient contact the pharmacy for the refill. If patient does not wish to contact the pharmacy document the reason why and proceed with request.) (Agent: If yes, when and what did the pharmacy advise?)  This is the patient's preferred pharmacy:  [id number] [address] [place], [place] - [address] AT [facility] & [address] [place] [address] Phone: [phone] Fax: [phone]  Is this the correct pharmacy for this prescription? Yes If no, delete pharmacy and type the correct one.   Has the prescription been filled recently? No  Is the patient out of the medication? Yes  Has the patient been seen for an appointment in the last year OR does the patient have an upcoming appointment? Yes  Can we respond through MyChart? Yes  Agent: Please be advised that Rx refills may take up to 3 business days. We ask that you follow-up with your pharmacy.

## 2024-04-21 ENCOUNTER — Institutional Professional Consult (permissible substitution) (INDEPENDENT_AMBULATORY_CARE_PROVIDER_SITE_OTHER)

## 2024-06-12 ENCOUNTER — Ambulatory Visit

## 2024-06-12 ENCOUNTER — Encounter: Admitting: Emergency Medicine
